# Patient Record
Sex: Female | Born: 1955
Health system: Southern US, Community
[De-identification: ages and names within clinical notes are randomized; demographics above are authoritative.]

## PROBLEM LIST (undated history)

## (undated) DIAGNOSIS — T7840XA Allergy, unspecified, initial encounter: Secondary | ICD-10-CM

## (undated) DIAGNOSIS — Z923 Personal history of irradiation: Secondary | ICD-10-CM

## (undated) DIAGNOSIS — E039 Hypothyroidism, unspecified: Secondary | ICD-10-CM

## (undated) DIAGNOSIS — N390 Urinary tract infection, site not specified: Secondary | ICD-10-CM

## (undated) DIAGNOSIS — F329 Major depressive disorder, single episode, unspecified: Secondary | ICD-10-CM

## (undated) DIAGNOSIS — H269 Unspecified cataract: Secondary | ICD-10-CM

## (undated) DIAGNOSIS — Z955 Presence of coronary angioplasty implant and graft: Secondary | ICD-10-CM

## (undated) DIAGNOSIS — G2581 Restless legs syndrome: Secondary | ICD-10-CM

## (undated) DIAGNOSIS — N189 Chronic kidney disease, unspecified: Secondary | ICD-10-CM

## (undated) DIAGNOSIS — IMO0002 Reserved for concepts with insufficient information to code with codable children: Secondary | ICD-10-CM

## (undated) DIAGNOSIS — J302 Other seasonal allergic rhinitis: Secondary | ICD-10-CM

## (undated) DIAGNOSIS — S060X9A Concussion with loss of consciousness of unspecified duration, initial encounter: Secondary | ICD-10-CM

## (undated) DIAGNOSIS — R3129 Other microscopic hematuria: Secondary | ICD-10-CM

## (undated) DIAGNOSIS — F32A Depression, unspecified: Secondary | ICD-10-CM

## (undated) DIAGNOSIS — K219 Gastro-esophageal reflux disease without esophagitis: Secondary | ICD-10-CM

## (undated) DIAGNOSIS — C50919 Malignant neoplasm of unspecified site of unspecified female breast: Secondary | ICD-10-CM

## (undated) DIAGNOSIS — F419 Anxiety disorder, unspecified: Secondary | ICD-10-CM

## (undated) HISTORY — DX: Hypothyroidism, unspecified: E03.9

## (undated) HISTORY — PX: APPENDECTOMY: SHX54

## (undated) HISTORY — PX: SHOULDER SURGERY: SHX246

## (undated) HISTORY — DX: Depression, unspecified: F32.A

## (undated) HISTORY — PX: BREAST LUMPECTOMY: SHX2

## (undated) HISTORY — DX: Unspecified cataract: H26.9

## (undated) HISTORY — PX: CHOLECYSTECTOMY OPEN: SUR202

## (undated) HISTORY — DX: Restless legs syndrome: G25.81

## (undated) HISTORY — DX: Other microscopic hematuria: R31.29

## (undated) HISTORY — DX: Urinary tract infection, site not specified: N39.0

## (undated) HISTORY — PX: ABDOMINAL HYSTERECTOMY: SHX81

## (undated) HISTORY — PX: EYE SURGERY: SHX253

## (undated) HISTORY — DX: Other seasonal allergic rhinitis: J30.2

## (undated) HISTORY — DX: Major depressive disorder, single episode, unspecified: F32.9

## (undated) HISTORY — DX: Gastro-esophageal reflux disease without esophagitis: K21.9

## (undated) HISTORY — PX: CYSTOSCOPY: SUR368

## (undated) HISTORY — PX: SPINE SURGERY: SHX786

## (undated) HISTORY — DX: Malignant neoplasm of unspecified site of unspecified female breast: C50.919

## (undated) HISTORY — DX: Concussion with loss of consciousness of unspecified duration, initial encounter: S06.0X9A

## (undated) HISTORY — DX: Allergy, unspecified, initial encounter: T78.40XA

## (undated) HISTORY — DX: Chronic kidney disease, unspecified: N18.9

---

## 1995-01-28 HISTORY — PX: OTHER SURGICAL HISTORY: SHX169

## 1998-04-08 ENCOUNTER — Emergency Department (HOSPITAL_COMMUNITY): Admission: EM | Admit: 1998-04-08 | Discharge: 1998-04-08 | Payer: Self-pay | Admitting: Emergency Medicine

## 1998-07-23 ENCOUNTER — Encounter: Payer: Self-pay | Admitting: Family Medicine

## 1998-07-23 ENCOUNTER — Ambulatory Visit (HOSPITAL_COMMUNITY): Admission: RE | Admit: 1998-07-23 | Discharge: 1998-07-23 | Payer: Self-pay | Admitting: Family Medicine

## 1998-11-14 ENCOUNTER — Encounter: Payer: Self-pay | Admitting: Emergency Medicine

## 1998-11-14 ENCOUNTER — Emergency Department (HOSPITAL_COMMUNITY): Admission: EM | Admit: 1998-11-14 | Discharge: 1998-11-14 | Payer: Self-pay | Admitting: Emergency Medicine

## 1999-09-12 ENCOUNTER — Ambulatory Visit (HOSPITAL_COMMUNITY): Admission: RE | Admit: 1999-09-12 | Discharge: 1999-09-12 | Payer: Self-pay | Admitting: Family Medicine

## 1999-09-12 ENCOUNTER — Encounter: Payer: Self-pay | Admitting: Family Medicine

## 2000-12-21 ENCOUNTER — Other Ambulatory Visit: Admission: RE | Admit: 2000-12-21 | Discharge: 2000-12-21 | Payer: Self-pay | Admitting: *Deleted

## 2001-12-08 ENCOUNTER — Ambulatory Visit: Admission: RE | Admit: 2001-12-08 | Discharge: 2001-12-08 | Payer: Self-pay | Admitting: Family Medicine

## 2001-12-08 ENCOUNTER — Encounter: Payer: Self-pay | Admitting: Cardiology

## 2002-05-02 ENCOUNTER — Other Ambulatory Visit: Admission: RE | Admit: 2002-05-02 | Discharge: 2002-05-02 | Payer: Self-pay | Admitting: *Deleted

## 2002-09-28 ENCOUNTER — Encounter: Admission: RE | Admit: 2002-09-28 | Discharge: 2002-09-28 | Payer: Self-pay | Admitting: Family Medicine

## 2002-09-28 ENCOUNTER — Encounter: Payer: Self-pay | Admitting: Family Medicine

## 2003-05-23 ENCOUNTER — Other Ambulatory Visit: Admission: RE | Admit: 2003-05-23 | Discharge: 2003-05-23 | Payer: Self-pay | Admitting: *Deleted

## 2003-07-13 ENCOUNTER — Emergency Department (HOSPITAL_COMMUNITY): Admission: EM | Admit: 2003-07-13 | Discharge: 2003-07-13 | Payer: Self-pay | Admitting: Emergency Medicine

## 2003-10-31 ENCOUNTER — Ambulatory Visit (HOSPITAL_COMMUNITY): Admission: RE | Admit: 2003-10-31 | Discharge: 2003-10-31 | Payer: Self-pay | Admitting: Family Medicine

## 2004-05-08 ENCOUNTER — Ambulatory Visit: Payer: Self-pay | Admitting: Nurse Practitioner

## 2004-05-24 ENCOUNTER — Ambulatory Visit: Payer: Self-pay | Admitting: Family Medicine

## 2004-07-01 ENCOUNTER — Ambulatory Visit: Payer: Self-pay | Admitting: Family Medicine

## 2004-07-08 ENCOUNTER — Ambulatory Visit (HOSPITAL_COMMUNITY): Admission: RE | Admit: 2004-07-08 | Discharge: 2004-07-08 | Payer: Self-pay | Admitting: Family Medicine

## 2004-11-05 ENCOUNTER — Ambulatory Visit (HOSPITAL_COMMUNITY): Admission: RE | Admit: 2004-11-05 | Discharge: 2004-11-05 | Payer: Self-pay | Admitting: Family Medicine

## 2005-01-27 HISTORY — PX: CARDIAC CATHETERIZATION: SHX172

## 2005-04-01 ENCOUNTER — Ambulatory Visit: Payer: Self-pay | Admitting: Family Medicine

## 2005-04-16 ENCOUNTER — Other Ambulatory Visit: Admission: RE | Admit: 2005-04-16 | Discharge: 2005-04-16 | Payer: Self-pay | Admitting: *Deleted

## 2005-05-16 ENCOUNTER — Ambulatory Visit: Payer: Self-pay | Admitting: Internal Medicine

## 2005-05-16 ENCOUNTER — Inpatient Hospital Stay (HOSPITAL_COMMUNITY): Admission: EM | Admit: 2005-05-16 | Discharge: 2005-05-17 | Payer: Self-pay | Admitting: Internal Medicine

## 2005-05-16 ENCOUNTER — Ambulatory Visit: Payer: Self-pay | Admitting: Cardiology

## 2005-05-22 ENCOUNTER — Ambulatory Visit: Payer: Self-pay | Admitting: Internal Medicine

## 2005-06-02 ENCOUNTER — Ambulatory Visit: Payer: Self-pay | Admitting: Internal Medicine

## 2005-06-02 ENCOUNTER — Ambulatory Visit: Payer: Self-pay | Admitting: Cardiology

## 2005-06-05 ENCOUNTER — Ambulatory Visit: Payer: Self-pay

## 2005-06-16 ENCOUNTER — Ambulatory Visit: Payer: Self-pay | Admitting: Cardiology

## 2005-07-01 ENCOUNTER — Ambulatory Visit: Payer: Self-pay | Admitting: Cardiology

## 2005-08-21 ENCOUNTER — Encounter (HOSPITAL_COMMUNITY): Admission: RE | Admit: 2005-08-21 | Discharge: 2005-11-19 | Payer: Self-pay | Admitting: Cardiology

## 2005-09-11 ENCOUNTER — Ambulatory Visit: Payer: Self-pay | Admitting: Cardiology

## 2005-09-16 ENCOUNTER — Ambulatory Visit: Payer: Self-pay | Admitting: Cardiology

## 2005-11-18 ENCOUNTER — Ambulatory Visit (HOSPITAL_COMMUNITY): Admission: RE | Admit: 2005-11-18 | Discharge: 2005-11-18 | Payer: Self-pay | Admitting: Family Medicine

## 2005-11-20 ENCOUNTER — Encounter (HOSPITAL_COMMUNITY): Admission: RE | Admit: 2005-11-20 | Discharge: 2005-11-21 | Payer: Self-pay | Admitting: Cardiology

## 2006-09-07 ENCOUNTER — Ambulatory Visit: Payer: Self-pay | Admitting: Cardiology

## 2006-09-11 ENCOUNTER — Encounter: Payer: Self-pay | Admitting: Internal Medicine

## 2006-09-11 ENCOUNTER — Ambulatory Visit: Payer: Self-pay

## 2006-09-11 LAB — CONVERTED CEMR LAB
ALT: 16 units/L (ref 0–35)
AST: 19 units/L (ref 0–37)
HDL: 38.3 mg/dL — ABNORMAL LOW (ref >39.0)
LDL Cholesterol: 72 mg/dL (ref 0–99)
Total Protein: 7.2 g/dL (ref 6.0–8.3)
VLDL: 22 mg/dL (ref 0–40)

## 2006-11-20 ENCOUNTER — Ambulatory Visit (HOSPITAL_COMMUNITY): Admission: RE | Admit: 2006-11-20 | Discharge: 2006-11-20 | Payer: Self-pay | Admitting: Family Medicine

## 2007-08-05 ENCOUNTER — Ambulatory Visit: Payer: Self-pay | Admitting: Family Medicine

## 2007-08-05 DIAGNOSIS — M5137 Other intervertebral disc degeneration, lumbosacral region: Secondary | ICD-10-CM

## 2007-08-05 DIAGNOSIS — E669 Obesity, unspecified: Secondary | ICD-10-CM | POA: Insufficient documentation

## 2007-08-05 DIAGNOSIS — E785 Hyperlipidemia, unspecified: Secondary | ICD-10-CM | POA: Insufficient documentation

## 2007-08-05 DIAGNOSIS — K219 Gastro-esophageal reflux disease without esophagitis: Secondary | ICD-10-CM

## 2007-08-05 DIAGNOSIS — F329 Major depressive disorder, single episode, unspecified: Secondary | ICD-10-CM

## 2007-08-05 DIAGNOSIS — G43719 Chronic migraine without aura, intractable, without status migrainosus: Secondary | ICD-10-CM

## 2007-08-05 DIAGNOSIS — I2510 Atherosclerotic heart disease of native coronary artery without angina pectoris: Secondary | ICD-10-CM

## 2007-08-05 DIAGNOSIS — R609 Edema, unspecified: Secondary | ICD-10-CM

## 2007-08-06 ENCOUNTER — Encounter: Payer: Self-pay | Admitting: Family Medicine

## 2007-08-06 ENCOUNTER — Telehealth: Payer: Self-pay | Admitting: Family Medicine

## 2007-08-10 LAB — CONVERTED CEMR LAB
ALT: 25 units/L (ref 0–35)
Albumin: 4 g/dL (ref 3.5–5.2)
Alkaline Phosphatase: 57 units/L (ref 39–117)
Basophils Absolute: 0 10*3/uL (ref 0.0–0.1)
Basophils Relative: 0.3 % (ref 0.0–1.0)
CO2: 24 meq/L (ref 19–32)
Chloride: 110 meq/L (ref 96–112)
Creatinine, Ser: 1.4 mg/dL — ABNORMAL HIGH (ref 0.4–1.2)
Eosinophils Absolute: 0.1 10*3/uL (ref 0.0–0.7)
Eosinophils Relative: 2.4 % (ref 0.0–5.0)
HDL: 37.8 mg/dL — ABNORMAL LOW (ref >39.0)
Hemoglobin: 12.8 g/dL (ref 12.0–15.0)
MCHC: 34.8 g/dL (ref 30.0–36.0)
MCV: 87.8 fL (ref 78.0–100.0)
Monocytes Absolute: 0.5 10*3/uL (ref 0.1–1.0)
Monocytes Relative: 9 % (ref 3.0–12.0)
Neutro Abs: 3.6 10*3/uL (ref 1.4–7.7)
Platelets: 183 10*3/uL (ref 150–400)
Potassium: 3.6 meq/L (ref 3.5–5.1)
RBC: 4.2 M/uL (ref 3.87–5.11)
Sodium: 144 meq/L (ref 135–145)
T4, Total: 7.3 ug/dL (ref 5.0–12.5)
Total Bilirubin: 1.3 mg/dL — ABNORMAL HIGH (ref 0.3–1.2)

## 2007-08-31 ENCOUNTER — Ambulatory Visit: Payer: Self-pay | Admitting: Cardiology

## 2007-09-09 ENCOUNTER — Ambulatory Visit: Payer: Self-pay | Admitting: Family Medicine

## 2007-09-09 DIAGNOSIS — R7989 Other specified abnormal findings of blood chemistry: Secondary | ICD-10-CM | POA: Insufficient documentation

## 2007-09-10 LAB — CONVERTED CEMR LAB
Albumin: 3.9 g/dL (ref 3.5–5.2)
BUN: 17 mg/dL (ref 6–23)
CO2: 27 meq/L (ref 19–32)
Calcium: 9.7 mg/dL (ref 8.4–10.5)
Chloride: 109 meq/L (ref 96–112)
Creatinine, Ser: 1.3 mg/dL — ABNORMAL HIGH (ref 0.4–1.2)
GFR calc Af Amer: 55 mL/min
GFR calc non Af Amer: 46 mL/min
Glucose, Bld: 148 mg/dL — ABNORMAL HIGH (ref 70–99)
Hgb A1c MFr Bld: 5.6 % (ref 4.6–6.0)
Phosphorus: 2.8 mg/dL (ref 2.3–4.6)
Potassium: 3.7 meq/L (ref 3.5–5.1)
Sodium: 142 meq/L (ref 135–145)

## 2007-09-16 ENCOUNTER — Ambulatory Visit: Payer: Self-pay | Admitting: Family Medicine

## 2007-09-16 DIAGNOSIS — N289 Disorder of kidney and ureter, unspecified: Secondary | ICD-10-CM

## 2007-09-16 DIAGNOSIS — R7309 Other abnormal glucose: Secondary | ICD-10-CM | POA: Insufficient documentation

## 2007-09-16 DIAGNOSIS — Z87448 Personal history of other diseases of urinary system: Secondary | ICD-10-CM

## 2007-09-16 LAB — CONVERTED CEMR LAB
Glucose, Urine, Semiquant: NEGATIVE
Ketones, urine, test strip: NEGATIVE
Specific Gravity, Urine: 1.015

## 2007-09-17 ENCOUNTER — Encounter: Payer: Self-pay | Admitting: Family Medicine

## 2007-09-22 LAB — CONVERTED CEMR LAB
Microalb Creat Ratio: 1.8 mg/g (ref 0.0–30.0)
Microalb, Ur: 0.2 mg/dL (ref 0.0–1.9)

## 2007-09-24 ENCOUNTER — Encounter: Payer: Self-pay | Admitting: Family Medicine

## 2007-10-08 ENCOUNTER — Encounter: Payer: Self-pay | Admitting: Family Medicine

## 2007-10-11 ENCOUNTER — Encounter (HOSPITAL_COMMUNITY): Admission: RE | Admit: 2007-10-11 | Discharge: 2007-10-11 | Payer: Self-pay | Admitting: Endocrinology

## 2007-11-05 ENCOUNTER — Ambulatory Visit: Payer: Self-pay | Admitting: Family Medicine

## 2007-11-05 DIAGNOSIS — N390 Urinary tract infection, site not specified: Secondary | ICD-10-CM

## 2007-11-06 ENCOUNTER — Encounter: Payer: Self-pay | Admitting: Family Medicine

## 2007-11-08 LAB — CONVERTED CEMR LAB
Albumin: 4.5 g/dL (ref 3.5–5.2)
CO2: 24 meq/L (ref 19–32)
Calcium: 9.9 mg/dL (ref 8.4–10.5)
Glucose, Bld: 105 mg/dL — ABNORMAL HIGH (ref 70–99)
Phosphorus: 4.1 mg/dL (ref 2.3–4.6)

## 2007-11-15 ENCOUNTER — Encounter (HOSPITAL_COMMUNITY): Admission: RE | Admit: 2007-11-15 | Discharge: 2008-01-25 | Payer: Self-pay | Admitting: Endocrinology

## 2007-11-18 ENCOUNTER — Ambulatory Visit: Payer: Self-pay | Admitting: Family Medicine

## 2007-11-22 ENCOUNTER — Ambulatory Visit (HOSPITAL_COMMUNITY): Admission: RE | Admit: 2007-11-22 | Discharge: 2007-11-22 | Payer: Self-pay | Admitting: Family Medicine

## 2007-11-22 LAB — CONVERTED CEMR LAB
Albumin: 3.9 g/dL (ref 3.5–5.2)
BUN: 21 mg/dL (ref 6–23)
GFR calc Af Amer: 60 mL/min
Sodium: 141 meq/L (ref 135–145)

## 2007-12-06 ENCOUNTER — Encounter: Payer: Self-pay | Admitting: Family Medicine

## 2007-12-08 ENCOUNTER — Ambulatory Visit (HOSPITAL_COMMUNITY): Admission: RE | Admit: 2007-12-08 | Discharge: 2007-12-08 | Payer: Self-pay | Admitting: Endocrinology

## 2008-01-03 ENCOUNTER — Ambulatory Visit: Payer: Self-pay | Admitting: Family Medicine

## 2008-01-06 LAB — CONVERTED CEMR LAB
Albumin: 3.7 g/dL (ref 3.5–5.2)
BUN: 25 mg/dL — ABNORMAL HIGH (ref 6–23)
Calcium: 9.9 mg/dL (ref 8.4–10.5)
Chloride: 106 meq/L (ref 96–112)
Creatinine, Ser: 1.3 mg/dL — ABNORMAL HIGH (ref 0.4–1.2)
GFR calc non Af Amer: 46 mL/min
Glucose, Bld: 120 mg/dL — ABNORMAL HIGH (ref 70–99)

## 2008-02-10 ENCOUNTER — Encounter: Payer: Self-pay | Admitting: Family Medicine

## 2008-03-08 ENCOUNTER — Ambulatory Visit: Payer: Self-pay | Admitting: Family Medicine

## 2008-03-09 LAB — CONVERTED CEMR LAB
BUN: 22 mg/dL (ref 6–23)
CO2: 29 meq/L (ref 19–32)
Chloride: 111 meq/L (ref 96–112)
GFR calc Af Amer: 47 mL/min
GFR calc non Af Amer: 39 mL/min
Glucose, Bld: 105 mg/dL — ABNORMAL HIGH (ref 70–99)
Phosphorus: 3.8 mg/dL (ref 2.3–4.6)
Potassium: 4.2 meq/L (ref 3.5–5.1)

## 2008-03-20 ENCOUNTER — Encounter: Payer: Self-pay | Admitting: Family Medicine

## 2008-03-21 ENCOUNTER — Encounter: Payer: Self-pay | Admitting: Family Medicine

## 2008-04-04 ENCOUNTER — Encounter: Payer: Self-pay | Admitting: Family Medicine

## 2008-04-05 ENCOUNTER — Telehealth (INDEPENDENT_AMBULATORY_CARE_PROVIDER_SITE_OTHER): Payer: Self-pay | Admitting: *Deleted

## 2008-04-07 ENCOUNTER — Ambulatory Visit: Payer: Self-pay | Admitting: Family Medicine

## 2008-04-07 DIAGNOSIS — E559 Vitamin D deficiency, unspecified: Secondary | ICD-10-CM

## 2008-04-07 LAB — CONVERTED CEMR LAB
Casts: 0 /lpf
Urobilinogen, UA: 0.2
Yeast, UA: 0
pH: 5

## 2008-04-08 ENCOUNTER — Encounter: Payer: Self-pay | Admitting: Family Medicine

## 2008-04-11 LAB — CONVERTED CEMR LAB
AST: 15 units/L (ref 0–37)
Albumin: 4.2 g/dL (ref 3.5–5.2)
CO2: 23 meq/L (ref 19–32)
Chloride: 108 meq/L (ref 96–112)
Potassium: 3.8 meq/L (ref 3.5–5.3)

## 2008-04-20 ENCOUNTER — Encounter: Payer: Self-pay | Admitting: Family Medicine

## 2008-04-27 ENCOUNTER — Encounter: Payer: Self-pay | Admitting: Family Medicine

## 2008-04-27 ENCOUNTER — Ambulatory Visit: Payer: Self-pay | Admitting: Internal Medicine

## 2008-06-07 ENCOUNTER — Ambulatory Visit: Payer: Self-pay | Admitting: Family Medicine

## 2008-06-07 DIAGNOSIS — R400 Somnolence: Secondary | ICD-10-CM | POA: Insufficient documentation

## 2008-06-12 ENCOUNTER — Encounter: Payer: Self-pay | Admitting: Family Medicine

## 2008-06-12 LAB — CONVERTED CEMR LAB
Albumin: 4.2 g/dL (ref 3.5–5.2)
BUN: 24 mg/dL — ABNORMAL HIGH (ref 6–23)
CO2: 27 meq/L (ref 19–32)
Calcium: 10 mg/dL (ref 8.4–10.5)
Chloride: 107 meq/L (ref 96–112)
Creatinine, Ser: 1.3 mg/dL — ABNORMAL HIGH (ref 0.4–1.2)
Glucose, Bld: 96 mg/dL (ref 70–99)
Hgb A1c MFr Bld: 5.8 % (ref 4.6–6.5)
Phosphorus: 3.6 mg/dL (ref 2.3–4.6)
Potassium: 3.8 meq/L (ref 3.5–5.1)
Sodium: 140 meq/L (ref 135–145)

## 2008-06-21 ENCOUNTER — Ambulatory Visit: Payer: Self-pay | Admitting: Pulmonary Disease

## 2008-06-21 DIAGNOSIS — G4710 Hypersomnia, unspecified: Secondary | ICD-10-CM

## 2008-07-14 ENCOUNTER — Encounter (INDEPENDENT_AMBULATORY_CARE_PROVIDER_SITE_OTHER): Payer: Self-pay | Admitting: *Deleted

## 2008-07-16 ENCOUNTER — Encounter: Payer: Self-pay | Admitting: Pulmonary Disease

## 2008-07-16 ENCOUNTER — Ambulatory Visit (HOSPITAL_BASED_OUTPATIENT_CLINIC_OR_DEPARTMENT_OTHER): Admission: RE | Admit: 2008-07-16 | Discharge: 2008-07-16 | Payer: Self-pay | Admitting: Pulmonary Disease

## 2008-07-24 ENCOUNTER — Encounter: Payer: Self-pay | Admitting: Family Medicine

## 2008-07-26 ENCOUNTER — Ambulatory Visit: Payer: Self-pay | Admitting: Pulmonary Disease

## 2008-07-28 ENCOUNTER — Telehealth (INDEPENDENT_AMBULATORY_CARE_PROVIDER_SITE_OTHER): Payer: Self-pay | Admitting: *Deleted

## 2008-08-01 ENCOUNTER — Ambulatory Visit: Payer: Self-pay | Admitting: Internal Medicine

## 2008-08-14 ENCOUNTER — Ambulatory Visit: Payer: Self-pay | Admitting: Family Medicine

## 2008-08-16 ENCOUNTER — Ambulatory Visit: Payer: Self-pay | Admitting: Pulmonary Disease

## 2008-08-16 DIAGNOSIS — G2589 Other specified extrapyramidal and movement disorders: Secondary | ICD-10-CM

## 2008-08-16 LAB — CONVERTED CEMR LAB
CO2: 29 meq/L (ref 19–32)
Calcium: 9.6 mg/dL (ref 8.4–10.5)
Chloride: 104 meq/L (ref 96–112)
Glucose, Bld: 99 mg/dL (ref 70–99)
Phosphorus: 3.4 mg/dL (ref 2.3–4.6)
Sodium: 139 meq/L (ref 135–145)

## 2008-08-29 ENCOUNTER — Telehealth: Payer: Self-pay | Admitting: Internal Medicine

## 2008-08-29 ENCOUNTER — Ambulatory Visit: Payer: Self-pay | Admitting: Internal Medicine

## 2008-08-30 ENCOUNTER — Encounter: Payer: Self-pay | Admitting: Internal Medicine

## 2008-09-08 ENCOUNTER — Ambulatory Visit: Payer: Self-pay | Admitting: Family Medicine

## 2008-09-08 ENCOUNTER — Encounter: Admission: RE | Admit: 2008-09-08 | Discharge: 2008-09-08 | Payer: Self-pay | Admitting: Family Medicine

## 2008-09-08 LAB — CONVERTED CEMR LAB
Blood in Urine, dipstick: NEGATIVE
Epithelial cells, urine: 1 /lpf
RBC / HPF: 0
Specific Gravity, Urine: 1.01
Urobilinogen, UA: 0.2
WBC Urine, dipstick: NEGATIVE
WBC, UA: 0 cells/hpf
Yeast, UA: 0
pH: 6

## 2008-09-11 LAB — CONVERTED CEMR LAB
Albumin: 4.3 g/dL (ref 3.5–5.2)
CO2: 21 meq/L (ref 19–32)
Calcium: 9.9 mg/dL (ref 8.4–10.5)
Chloride: 110 meq/L (ref 96–112)
Glucose, Bld: 97 mg/dL (ref 70–99)
Potassium: 4.1 meq/L (ref 3.5–5.3)
Sodium: 142 meq/L (ref 135–145)

## 2008-09-20 ENCOUNTER — Ambulatory Visit: Payer: Self-pay | Admitting: Cardiology

## 2008-09-22 ENCOUNTER — Ambulatory Visit: Payer: Self-pay | Admitting: Cardiology

## 2008-09-28 ENCOUNTER — Encounter: Payer: Self-pay | Admitting: Family Medicine

## 2008-09-28 LAB — CONVERTED CEMR LAB
ALT: 30 units/L (ref 0–35)
AST: 27 units/L (ref 0–37)
Cholesterol: 119 mg/dL (ref 0–200)
LDL Cholesterol: 57 mg/dL (ref 0–99)
Total Bilirubin: 1.3 mg/dL — ABNORMAL HIGH (ref 0.3–1.2)
Total CHOL/HDL Ratio: 3
Total Protein: 7.5 g/dL (ref 6.0–8.3)
Triglycerides: 124 mg/dL (ref 0.0–149.0)

## 2008-10-03 ENCOUNTER — Encounter: Payer: Self-pay | Admitting: Internal Medicine

## 2008-10-03 ENCOUNTER — Ambulatory Visit: Payer: Self-pay | Admitting: Internal Medicine

## 2008-10-08 ENCOUNTER — Encounter: Payer: Self-pay | Admitting: Internal Medicine

## 2008-11-02 ENCOUNTER — Telehealth: Payer: Self-pay | Admitting: Cardiology

## 2008-12-08 ENCOUNTER — Telehealth: Payer: Self-pay | Admitting: Family Medicine

## 2008-12-11 ENCOUNTER — Ambulatory Visit: Payer: Self-pay | Admitting: Family Medicine

## 2008-12-12 LAB — CONVERTED CEMR LAB
BUN: 19 mg/dL (ref 6–23)
Creatinine, Ser: 1.3 mg/dL — ABNORMAL HIGH (ref 0.4–1.2)
Glucose, Bld: 113 mg/dL — ABNORMAL HIGH (ref 70–99)
Hgb A1c MFr Bld: 5.5 % (ref 4.6–6.5)

## 2009-02-26 ENCOUNTER — Encounter: Payer: Self-pay | Admitting: Family Medicine

## 2009-03-08 ENCOUNTER — Telehealth: Payer: Self-pay | Admitting: Family Medicine

## 2009-03-12 ENCOUNTER — Ambulatory Visit: Payer: Self-pay | Admitting: Family Medicine

## 2009-03-13 ENCOUNTER — Telehealth: Payer: Self-pay | Admitting: Family Medicine

## 2009-03-14 ENCOUNTER — Telehealth: Payer: Self-pay | Admitting: Family Medicine

## 2009-03-15 LAB — CONVERTED CEMR LAB
CO2: 27 meq/L (ref 19–32)
Calcium: 9.8 mg/dL (ref 8.4–10.5)
Creatinine, Ser: 1.4 mg/dL — ABNORMAL HIGH (ref 0.4–1.2)
Glucose, Bld: 121 mg/dL — ABNORMAL HIGH (ref 70–99)
Potassium: 4 meq/L (ref 3.5–5.1)
Sodium: 142 meq/L (ref 135–145)

## 2009-03-26 ENCOUNTER — Ambulatory Visit: Payer: Self-pay | Admitting: Family Medicine

## 2009-03-27 ENCOUNTER — Telehealth: Payer: Self-pay | Admitting: Family Medicine

## 2009-03-27 ENCOUNTER — Encounter: Payer: Self-pay | Admitting: Family Medicine

## 2009-04-11 ENCOUNTER — Encounter: Payer: Self-pay | Admitting: Family Medicine

## 2009-06-26 ENCOUNTER — Ambulatory Visit: Payer: Self-pay | Admitting: Family Medicine

## 2009-06-27 LAB — CONVERTED CEMR LAB: Hgb A1c MFr Bld: 5.8 % (ref 4.6–6.5)

## 2009-06-29 ENCOUNTER — Ambulatory Visit: Payer: Self-pay | Admitting: Family Medicine

## 2009-09-04 ENCOUNTER — Telehealth: Payer: Self-pay | Admitting: Family Medicine

## 2009-09-14 ENCOUNTER — Encounter: Payer: Self-pay | Admitting: Family Medicine

## 2009-09-25 ENCOUNTER — Ambulatory Visit: Payer: Self-pay | Admitting: Family Medicine

## 2009-09-25 LAB — CONVERTED CEMR LAB
AST: 27 units/L (ref 0–37)
Albumin: 4.1 g/dL (ref 3.5–5.2)
BUN: 16 mg/dL (ref 6–23)
CO2: 26 meq/L (ref 19–32)
Calcium: 10 mg/dL (ref 8.4–10.5)
Creatinine, Ser: 1.2 mg/dL (ref 0.4–1.2)
GFR calc non Af Amer: 48.15 mL/min (ref >60)
Glucose, Bld: 117 mg/dL — ABNORMAL HIGH (ref 70–99)
Sodium: 142 meq/L (ref 135–145)

## 2009-10-02 ENCOUNTER — Ambulatory Visit: Payer: Self-pay | Admitting: Family Medicine

## 2010-02-13 ENCOUNTER — Telehealth: Payer: Self-pay | Admitting: Family Medicine

## 2010-02-26 NOTE — Progress Notes (Signed)
Summary: Prior Authorization for Pantoprazole Sodium  Phone Note From Pharmacy   [facility]  [address] [phone]* Call For: Dr. Milinda Antis  Summary of Call: Received fax from pharmacy stating that PA is needed for Pantoprazole Sodium 40mg .  Called [employer] at [phone] and requested forms, will be sent within the hour.  Case# [id number].  Linde Gillis CMA Duncan Dull)  March 27, 2009 8:24 AM   Received PA form, in your IN box. Initial call taken by: Linde Gillis CMA Duncan Dull),  March 27, 2009 10:21 AM  Follow-up for Phone Call        form done and in nurse in box  Follow-up by: Judith Part MD,  March 27, 2009 11:19 AM  Additional Follow-up for Phone Call Additional follow up Details #1::        completed form faxed to [phone] as instructed. The original form given to [facility] needed later.Lewanda Rife LPN  March 28, 8655 3:44 PM      Appended Document: Prior Authorization for Pantoprazole Sodium Received PA Approval for Pantoprazole Sodium.  Approved from 03/06/2009 through 03/27/2010.  Patient and pharmacy notified.

## 2010-02-26 NOTE — Consult Note (Signed)
Summary: Dr.Martin Webb,[facility],Note  Dr.Martin Webb,[facility],Note   Imported By: Beau Fanny 04/16/2009 16:43:30  _____________________________________________________________________  External Attachment:    Type:   Image     Comment:   External Document

## 2010-02-26 NOTE — Assessment & Plan Note (Signed)
Summary: 6 mo f/u/tsc   Vital Signs:  Patient profile:   55 year old female Height:      67 inches Weight:      219.25 pounds BMI:     34.46 Temp:     97.6 degrees F oral Pulse rate:   60 / minute Pulse rhythm:   regular BP sitting:   90 / 60  (left arm) Cuff size:   large  Vitals Entered By: Lewanda Rife LPN (March 12, 2009 2:19 PM)  History of Present Illness: here to f/u for multiple chronic medical problems   is always feeling bad - wishes she could feel decent once in a while  constantly feels bad  persistant sore throat -- for a while like swallowing glass  Dr Talmage Nap does not think this is thyroid related  going on for months - this is really uncomfortably and spasming  feels icy cold at times  hoarseness constantly with low voice that goes on days at a time  also a lot of burning and burning  Dr [place] told her to stop her nexium -- due to plavix   (Dr Sharyn Lull had her on PPI)  zantac is not helpful at all    renal insuff was imp in nov with Cr 1.3 down from over 1.5 - on lasix instead of hctz  sugar control has been good with AIC 5.5   lipids great in summer with LDL 57- well within goal  thyroid status-- blood work with Dr Talmage Nap has been fine  does not think her thyroid is making her feel bad  is still incredibly cold all the time   edema  still sees Dr Farrel Demark-- really feels like she is going crazy every day with all of her physical symptoms   sleep disorder-- never figured it out - disc resless leg  is chronically tired  sleep doctor could not figure it out   has very sharp pains in arms and legs and thighs - not in the joints  when she walks - it aches severe aching  is not her cholesterol medicine for 3 weeks and no change at all  Dr Anne Hahn (takes care of her migraines) -- put her on gabapentin for poss restless leg syndrome  he also increased her topamax -- she wonders about the side effects   Allergies (verified): No Known Drug Allergies  Past  History:  Past Medical History: Last updated: 09/20/2008 Depression Eating disorder-- anorexia (better now) Headaches /migraine  GERD Allergies  arrythmia  CAD 2007 with stent (95% circumflex disease   treated with a Taxus stent, LAD with mild irregularities, right coronary   artery 30% stenosis, EF normal) Frequent utis-- on prophylaxis Edema (no HTN) Hyperlipidemia Vitamin D deficiency Microscopic hematuria with urol w/u  Restless leg disorder Urol-- Dr Ronal Fear Urol--Dr Wynelle Link Cardiol-- Dr Antoine Poche Neurol-- Dr Anne Hahn  Allergies-- Dr Sharyn Lull Gyn past -- Dr Randell Patient  Counselor-- Dr Farrel Demark Endo -- Talmage Nap  Past Surgical History: Last updated: 09/20/2008 Cholecystectomy Appendectomy  Hysterectomy- partial- then oophrectmy (ovarian cysts )  Lparoscopies for ovarian cysts Herniated disc low back 97-rapaired cystoscopy neg (Dr Ronal Fear) 9/09 CT abd/pelvis  Hyperthyroid - tx with rad iodine Hypothyroid after above tx Endo--Balan  Family History: Last updated: 09/20/2008 mother- CAD (cabg), DM, alzheimers in nsg home, thyroid problem, HTN father - does not know much about  2 brothers in good health  GF died of CAD , pancreatic ca uncles and aunts HTN GM with mental illness aunts with  mental illness  uncle with throat cancer  uncle with brain tumor  several distant family members with melanoma  emphysema: paternal aunt allergies: brothers heart disease: mother, maternal grandfather cancer: maternal grandfather (pancreatic), paternal aunt (ovarian), paternal uncle (bone, brother (melanoma)   Social History: Last updated: 09/20/2008 is a widow- lost husb to colon ca 11 years ago  son 71 years old is currently single and living with son.  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 -only smoked for 1 year as a teenager Alcohol Use - yes-occasional Illicit Drug Use - no Patient does not get regular exercise.   Risk  Factors: Exercise: no (08/29/2008)  Risk Factors: Smoking Status: quit (08/29/2008)  Review of Systems General:  Complains of fatigue and malaise; denies loss of appetite. Eyes:  eyes feel gritty and swollen constantly - on patanol . ENT:  Complains of sore throat; denies earache and nasal congestion. CV:  Denies chest pain or discomfort, lightheadness, and palpitations. Resp:  Denies cough, shortness of breath, and wheezing. GI:  Complains of gas, indigestion, and nausea; denies abdominal pain, bloody stools, change in bowel habits, and vomiting. GU:  Denies dysuria and urinary frequency. MS:  Complains of joint pain, muscle aches, and stiffness; denies cramps and muscle weakness. Derm:  Denies itching, lesion(s), poor wound healing, and rash. Neuro:  Complains of headaches; denies numbness, sensation of room spinning, and tingling. Psych:  Complains of anxiety and depression; denies panic attacks, sense of great danger, and suicidal thoughts/plans. Endo:  Denies excessive thirst and excessive urination. Heme:  Denies abnormal bruising and bleeding.  Physical Exam  General:  overweight but generally well appearing  Head:  normocephalic, atraumatic, and no abnormalities observed.   Eyes:  vision grossly intact, pupils equal, pupils round, pupils reactive to light, and no injection.   Ears:  R ear normal and no external deformities.   Nose:  no nasal discharge.   Mouth:  pharynx pink and moist, no erythema, and no exudates.   Neck:  supple with full rom and no masses or thyromegally, no JVD or carotid bruit  Chest Wall:  No deformities, masses, or tenderness noted. Lungs:  Normal respiratory effort, chest expands symmetrically. Lungs are clear to auscultation, no crackles or wheezes. Heart:  Normal rate and regular rhythm. S1 and S2 normal without gallop, murmur, click, rub or other extra sounds. Abdomen:  mild epigastric tenderness without rebound or gaurding normal bowel sounds,  no distention, no hepatomegaly, and no splenomegaly.   Msk:  No deformity or scoliosis noted of thoracic or lumbar spine.  no CVA tenderness  no acute joint changes  Pulses:  R and L carotid,radial,femoral,dorsalis pedis and posterior tibial pulses are full and equal bilaterally Extremities:  improved edema in ankles- trace Neurologic:  sensation intact to light touch, gait normal, and DTRs symmetrical and normal.   Skin:  Intact without suspicious lesions or rashes Cervical Nodes:  No lymphadenopathy noted Inguinal Nodes:  No significant adenopathy Psych:  generally anxious and very negative  frustrated with her malaise talks freely about stress   Impression & Recommendations:  Problem # 1:  HYPERSOMNIA (ICD-780.54) Assessment Unchanged still unable to find out etiology gabapentin not helping disc poss of depression or topamax side eff  disc further at f/u  ? consider wellbutrin  Problem # 2:  RENAL INSUFFICIENCY (ICD-588.9) Assessment: Improved lab today had imp with switch to lasix  ? if topamax is playing role enc good  water intake Orders: Venipuncture (55732) TLB-Renal Function Panel (80069-RENAL)  Problem # 3:  HYPERGLYCEMIA (ICD-790.29) Assessment: Unchanged  AIC persists below 6 enc to keep watching diet for sugar and work on wt loss  Labs Reviewed: Creat: 1.3 (12/11/2008)     Problem # 4:  COMMON MIGRAINE (ICD-346.10) Assessment: Unchanged pt is imp with topamax- but wonder about side eff of depression/pain/ eye eff/renal she is f/u soon with opthy enc to disc with her neurologist  Her updated medication list for this problem includes:    Propranolol Hcl Cr 120 Mg Cp24 (Propranolol hcl) .Marland Kitchen... Take 1 capsule once daily    [facility] Aspirin 325 Mg Tabs (Aspirin) .Marland Kitchen... Take 1 tablet once daily  Problem # 5:  HYPERLIPIDEMIA (ICD-272.4) Assessment: Unchanged  very good control with statin no imp in muscle painoff of it  Her updated medication list for this  problem includes:    Simvastatin 40 Mg Tabs (Simvastatin) .Marland Kitchen... Take 1 tablet at bedtime  Labs Reviewed: SGOT: 27 (09/22/2008)   SGPT: 30 (09/22/2008)  Prior 10 Yr Risk Heart Disease: N/A (09/20/2008)   HDL:37.70 (09/22/2008), 37.8 (08/05/2007)  LDL:57 (09/22/2008), 91 (08/05/2007)  Chol:119 (09/22/2008), 154 (08/05/2007)  Trig:124.0 (09/22/2008), 126 (08/05/2007)  Problem # 6:  GERD (ICD-530.81) much worse with throat symptoms off ppi will re start protonix -- less likely to interact with plavix  at this point symptoms are severe and zantac does not work f/u 2 wk  The following medications were removed from the medication list:    Nexium 40 Mg Cpdr (Esomeprazole magnesium) .Marland Kitchen... Take 1 tablet by mouth two times a day Her updated medication list for this problem includes:    Protonix 40 Mg Tbec (Pantoprazole sodium) .Marland Kitchen... 1 by mouth once daily  Complete Medication List: 1)  Vitamin D 1000 Unit Tabs (Cholecalciferol) .... Take 1 tablet by mouth two times a day 2)  Astelin 137 Mcg/spray Soln (Azelastine hcl) .... Inhale 1 to 2 sprays in each nostril once to twice daily as needed for runny nose and drainage 3)  Patanol 0.1 % Soln (Olopatadine hcl) .... Place 1 drop in each eye 1 to 2 times daily as needed for itchy eyes 4)  Fluticasone Propionate 50 Mcg/act Susp (Fluticasone propionate) .... Use 2 spray in each nostril once daily as needed for stuffy nose or drainage 5)  Klor-con M20 20 Meq Tbcr (Potassium chloride crys cr) .... Take 2 tablest by mouth very 12 hours 6)  Cephalexin 250 Mg Tabs (Cephalexin) .... Take 1 tablet by mouth once a day 7)  Simvastatin 40 Mg Tabs (Simvastatin) .... Take 1 tablet at bedtime 8)  Propranolol Hcl Cr 120 Mg Cp24 (Propranolol hcl) .... Take 1 capsule once daily 9)  Plavix 75 Mg Tabs (Clopidogrel bisulfate) .... Take 1 tablet once daily 10)  Topiramate 100 Mg Tabs (Topiramate) .... Take 1 tablet two times a day 11)  [facility] Aspirin 325 Mg Tabs (Aspirin) ....  Take 1 tablet once daily 12)  Omega-3 Fish Oil 1200 Mg Caps (Omega-3 fatty acids) .... Take 1 capsule by mouth two times a day 13)  Multivitamins Caps (Multiple vitamin) .... Take 2 tables by mouth once a day 14)  Calcium 1200 1200-1000 Mg-unit Chew (Calcium carbonate-vit d-min) .... Take 1 chew by mouth two times a day 15)  Ginkgo Biloba Extr (Ginkgo biloba) .... Take 1 tablet by mouth once a day (120 mg) 16)  Co Q-10 200mg  Caps (coenzyme Q10)  .... Take 1 tablet by mouth once a  day 17)  Armour Thyroid 90 Mg Tabs (Thyroid) .... Once dailt 18)  Lasix 20 Mg Tabs (Furosemide) .... Take 1 tablet by mouth once a day 19)  K Dur 10 Meq  .... Take 1 tablet by mouth once a day 20)  Nitroglycerin 0.4 Mg Subl (Nitroglycerin) .... One tablet under tongue every 5 minutes as needed for chest pain---may repeat times three 21)  Topiramate 50 Mg Tabs (Topiramate) .... Take one daily 22)  Gabapentin 300 Mg Caps (Gabapentin) .... Take one in morning and take two at night 23)  Protonix 40 Mg Tbec (Pantoprazole sodium) .Marland Kitchen.. 1 by mouth once daily  Patient Instructions: 1)  start protonix 40 mg daily for stomach and throat symptoms  2)  this may interact with plavix - but may be less apt to interfere than nexium  3)  see you eye doctor  4)  checking kidney function today  5)  whether you want to continue gabapentin is up to you  6)  have a visit to discuss topamax with your neurologist - I am worried about side effects and all the symptoms you are having 7)  follow up with me in 2 weeks  8)  I may consider wellbutrin for depression in the future  Prescriptions: PROTONIX 40 MG TBEC (PANTOPRAZOLE SODIUM) 1 by mouth once daily  #30 x 11   Entered and Authorized by:   Judith Part MD   Signed by:   Judith Part MD on 03/12/2009   Method used:   [facility]        [facility] [phone]* (retail)       [address]       [place], [place]  [id number]       Ph:  [phone] or [phone]       Fax: [phone]   RxID:   [id number]   Current Allergies (reviewed today): No known allergies

## 2010-02-26 NOTE — Medication Information (Signed)
Summary: [employer] Prior Auth approval for Pantoprazole Sodium Tablet Dr from  [employer] Prior Auth approval for Pantoprazole Sodium Tablet Dr from 03/06/09-03/27/10   Imported By: Beau Fanny 03/30/2009 11:42:05  _____________________________________________________________________  External Attachment:    Type:   Image     Comment:   External Document  Appended Document: [employer] Prior Auth approval for Pantoprazole Sodium Tablet Dr from please let pt know protonix was approved and ask her if she needs px  if so can call in 1 mo with 11 ref - thanks

## 2010-02-26 NOTE — Progress Notes (Signed)
Summary: Protonix not covered by insurance  Phone Note Call from Patient Call back at [phone]   Caller: Patient Call For: Judith Part MD Summary of Call: Pt said insurance will not cover Protonix. Pt took Nexium before but her heart dr. stopped Nexium because she takes Plavix. Pt will get pharmacy to send a prior authorization for Protonix. Initial call taken by: Lewanda Rife LPN,  March 14, 2009 3:35 PM  Follow-up for Phone Call        send this back to me when prior auth arrives please Follow-up by: Judith Part MD,  March 14, 2009 3:57 PM  Additional Follow-up for Phone Call Additional follow up Details #1::        [facility] called [facility] [address] and they will print what they have available and send to Dr. Milinda Antis.Lewanda Rife LPN  March 26, 2009 4:07 PM

## 2010-02-26 NOTE — Consult Note (Signed)
Summary: Dr.Martin Webb,[facility],Note  [facility]   Imported By: Beau Fanny 07/04/2009 16:41:05  _____________________________________________________________________  External Attachment:    Type:   Image     Comment:   External Document

## 2010-02-26 NOTE — Progress Notes (Signed)
Summary: Clarification on K+ supplement  Phone Note Refill Request Call back at [phone] Message from:  [facility] [address] on March 08, 2009 4:08 PM  Refills Requested: Medication #1:  K DUR 10 MEQ Take 1 tablet by mouth once a day   Last Refilled: [facility]/08/2009 [facility] pt already filled the klor con and was to pick it up today. She would not fill the and will ask pt to talk with you about K+ supplement on her appt 03/12/09. Is that ok?  Initial call taken by: Lewanda Rife LPN,  March 08, 2009 4:12 PM  Follow-up for Phone Call        by the med list she is supposed to be on the 20 meq  Follow-up by: Judith Part MD,  March 08, 2009 4:23 PM  Additional Follow-up for Phone Call Additional follow up Details #1::        spoke with Arlys John at [facility] [address] and he was notified and he said he would tell pt and tell pt to keep appt on Mon . Lewanda Rife LPN  March 08, 2009 4:27 PM

## 2010-02-26 NOTE — Assessment & Plan Note (Signed)
Summary: 2 WEEK FOLLOW UPR/BH   Vital Signs:  Patient profile:   55 year old female Height:      67 inches Weight:      220.25 pounds BMI:     34.62 Temp:     98.5 degrees F oral Pulse rate:   60 / minute Pulse rhythm:   regular BP sitting:   104 / 64  (left arm) Cuff size:   large  Vitals Entered By: Lewanda Rife LPN (March 26, 2009 2:07 PM)  History of Present Illness: here for f/u of mult med problems  was ref to  renal for renal insuff cr 1.4 had called and returned the call - left a mess to get appt -- pend call back   was on gabapentin for rls--takes it occasionally with pain in general (moreso with rls )    is cutting down the topamax in 50 mg increments  spoke with her neurologist -- wants to keep her on the propranolol  watching closely for headaches   depression  mood is about the same as last time   gerd/ throat symptoms  need a prior auth for protonix  no  chance to start it  still takes a nexium now and again   overall is hanging in there   checks her sugar every so often -- has gone up lately    Allergies (verified): No Known Drug Allergies  Past History:  Past Medical History: Last updated: 09/20/2008 Depression Eating disorder-- anorexia (better now) Headaches /migraine  GERD Allergies  arrythmia  CAD 2007 with stent (95% circumflex disease   treated with a Taxus stent, LAD with mild irregularities, right coronary   artery 30% stenosis, EF normal) Frequent utis-- on prophylaxis Edema (no HTN) Hyperlipidemia Vitamin D deficiency Microscopic hematuria with urol w/u  Restless leg disorder Urol-- Dr Ronal Fear Urol--Dr Wynelle Link Cardiol-- Dr Antoine Poche Neurol-- Dr Anne Hahn  Allergies-- Dr Octavia Bruckner past -- Dr Randell Patient  Counselor-- Dr Farrel Demark Endo -- Talmage Nap  Past Surgical History: Last updated: 09/20/2008 Cholecystectomy Appendectomy  Hysterectomy- partial- then oophrectmy (ovarian cysts )  Lparoscopies for ovarian cysts Herniated disc low  back 97-rapaired cystoscopy neg (Dr Ronal Fear) 9/09 CT abd/pelvis  Hyperthyroid - tx with rad iodine Hypothyroid after above tx Endo--Balan  Family History: Last updated: 09/20/2008 mother- CAD (cabg), DM, alzheimers in nsg home, thyroid problem, HTN father - does not know much about  2 brothers in good health  GF died of CAD , pancreatic ca uncles and aunts HTN GM with mental illness aunts with mental illness  uncle with throat cancer  uncle with brain tumor  several distant family members with melanoma  emphysema: paternal aunt allergies: brothers heart disease: mother, maternal grandfather cancer: maternal grandfather (pancreatic), paternal aunt (ovarian), paternal uncle (bone, brother (melanoma)   Social History: Last updated: 09/20/2008 is a widow- lost husb to colon ca 11 years ago  son 32 years old is currently single and living with son.  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 -only smoked for 1 year as a teenager Alcohol Use - yes-occasional Illicit Drug Use - no Patient does not get regular exercise.   Risk Factors: Exercise: no (08/29/2008)  Risk Factors: Smoking Status: quit (08/29/2008)  Review of Systems General:  Complains of fatigue; denies fever, loss of appetite, and malaise. Eyes:  Denies blurring and eye pain. CV:  Denies chest pain or discomfort and palpitations. Resp:  Denies cough, sputum productive,  and wheezing. GI:  Complains of indigestion; denies abdominal pain, bloody stools, change in bowel habits, nausea, and vomiting. MS:  Complains of joint pain and stiffness; denies cramps and muscle weakness. Derm:  Denies itching, lesion(s), and rash. Neuro:  Complains of headaches; denies numbness and tingling. Psych:  Complains of anxiety. Endo:  Denies cold intolerance, excessive thirst, excessive urination, and heat intolerance. Heme:  Denies abnormal bruising and bleeding.  Physical  Exam  General:  overweight but generally well appearing  Head:  normocephalic, atraumatic, and no abnormalities observed.   Eyes:  vision grossly intact, pupils equal, pupils round, pupils reactive to light, and no injection.   Neck:  supple with full rom and no masses or thyromegally, no JVD or carotid bruit  Lungs:  Normal respiratory effort, chest expands symmetrically. Lungs are clear to auscultation, no crackles or wheezes. Heart:  Normal rate and regular rhythm. S1 and S2 normal without gallop, murmur, click, rub or other extra sounds. Abdomen:  some mild epigastric tenderness without rebound or gaurding  normal bowel sounds, no distention, no masses, no hepatomegaly, and no splenomegaly.   Msk:  No deformity or scoliosis noted of thoracic or lumbar spine.  no acute joint changes  Pulses:  R and L carotid,radial,femoral,dorsalis pedis and posterior tibial pulses are full and equal bilaterally Extremities:  No clubbing, cyanosis, edema, or deformity noted with normal full range of motion of all joints.   Neurologic:  sensation intact to light touch, gait normal, and DTRs symmetrical and normal.   Skin:  Intact without suspicious lesions or rashes Cervical Nodes:  No lymphadenopathy noted Inguinal Nodes:  No significant adenopathy Psych:  normal affect, talkative and pleasant    Impression & Recommendations:  Problem # 1:  HYPERGLYCEMIA (ICD-790.29) Assessment Unchanged will continue to watch diet and work on wt loss per pt except stable plan on AIC in 3 mo and f/u  Problem # 2:  RENAL INSUFFICIENCY (ICD-588.9) Assessment: Deteriorated for f/u with nephrology- to make appt hopefully on the way out  pt is decreasing dose of topamax and doing ok so far   Problem # 3:  GERD (ICD-530.81) Assessment: Unchanged unable to get protonix yet disc pref for this over other PPI due to concominant plavix tx  will call in asap to get prior auth given sample of aciphex if needed f/u 3 mo  or earlier if throat symptoms do not imp  Her updated medication list for this problem includes:    Protonix 40 Mg Tbec (Pantoprazole sodium) .Marland Kitchen... 1 by mouth once daily  Complete Medication List: 1)  Vitamin D 1000 Unit Tabs (Cholecalciferol) .... Take 1 tablet by mouth two times a day 2)  Astelin 137 Mcg/spray Soln (Azelastine hcl) .... Inhale 1 to 2 sprays in each nostril once to twice daily as needed for runny nose and drainage 3)  Patanol 0.1 % Soln (Olopatadine hcl) .... Place 1 drop in each eye 1 to 2 times daily as needed for itchy eyes 4)  Fluticasone Propionate 50 Mcg/act Susp (Fluticasone propionate) .... Use 2 spray in each nostril once daily as needed for stuffy nose or drainage 5)  Klor-con M20 20 Meq Tbcr (Potassium chloride crys cr) .... Take 2 tablest by mouth very 12 hours 6)  Cephalexin 250 Mg Tabs (Cephalexin) .... Take 1 tablet by mouth once a day 7)  Simvastatin 40 Mg Tabs (Simvastatin) .... Take 1 tablet at bedtime 8)  Propranolol Hcl Cr 120 Mg Cp24 (Propranolol hcl) .... Take 1  capsule once daily 9)  Plavix 75 Mg Tabs (Clopidogrel bisulfate) .... Take 1 tablet once daily 10)  Cvs Aspirin 325 Mg Tabs (Aspirin) .... Take 1 tablet once daily 11)  Omega-3 Fish Oil 1200 Mg Caps (Omega-3 fatty acids) .... Take 1 capsule by mouth two times a day 12)  Multivitamins Caps (Multiple vitamin) .... Take 2 tables by mouth once a day 13)  Calcium 1200 1200-1000 Mg-unit Chew (Calcium carbonate-vit d-min) .... Take 1 chew by mouth two times a day 14)  Ginkgo Biloba Extr (Ginkgo biloba) .... Take 1 tablet by mouth once a day (120 mg) 15)  Co Q-10 200mg  Caps (coenzyme Q10)  .... Take 1 tablet by mouth once a day 16)  Armour Thyroid 90 Mg Tabs (Thyroid) .... Once dailt 17)  Lasix 20 Mg Tabs (Furosemide) .... Take 1 tablet by mouth once a day 18)  K Dur 10 Meq  .... Take 1 tablet by mouth once daily 19)  Nitroglycerin 0.4 Mg Subl (Nitroglycerin) .... One tablet under tongue every 5 minutes  as needed for chest pain---may repeat times three 20)  Topiramate 50 Mg Tabs (Topiramate) .... Take one daily 21)  Gabapentin 300 Mg Caps (Gabapentin) .... Take one in morning and take two at night 22)  Protonix 40 Mg Tbec (Pantoprazole sodium) .Marland Kitchen.. 1 by mouth once daily  Patient Instructions: 1)  please call your insurance company to have them send me a prior auth  2)  stop by and see Shirlee Limerick on the way out -- to schedule nephrology appt  3)  try aciphex one pill daily while waiting for prior auth for protonix  4)  schedule labs in 3 months renal and AIC and then follow up with me for hyperglycemia 5)  keep working on healthy diet and exercise  Current Allergies (reviewed today): No known allergies

## 2010-02-26 NOTE — Assessment & Plan Note (Signed)
Summary: F/U AFTER LABS / LFW R/S 07/02/09   Vital Signs:  Patient profile:   55 year old female Height:      67 inches Weight:      220.25 pounds BMI:     34.62 Temp:     98.3 degrees F oral Pulse rate:   60 / minute Pulse rhythm:   regular BP sitting:   98 / 60  (left arm) Cuff size:   large  Vitals Entered By: Lewanda Rife LPN (June 29, 1608 2:11 PM) CC: three month f/u after labs   History of Present Illness: here for f/u of hyperglycemia / edema and renal insuff  is doing some better  feeling better overall - perhaps from warmer weather  gets outdoors a bit more   started exercise program and videos -- Margie Billet  does it every day no matter what -- wt went up and then down -- has recently lost 10 lbs  back still hurts a lot - that is about the same is off topamax  is eating healthier too -- eats healthy all the time  eliminated simple sugar entirely and salt and grease   had visit with renal physician -- told to minimize nsaids and protonix if poss / use diuretics sparingly did [place] of renal system and some labs  everything came out just fine - ? if cr came down a bit   just saw him again this week - thought she was doing just fine  recommended gyn for so many urinary infection   gets quite a few headaches - but tries to minimize nsaids unless she really needs them  AIC is 5.8- up from 5.5 - still under DM range   thinks her keflex gives her continued yeast (trimethoprim did not ) -- this gets better with otc and comes back  eats lot of yogurt to help she really wants to move to different proph med  taken off trimethoprim in past due to inc cr   Allergies (verified): No Known Drug Allergies  Past History:  Past Medical History: Last updated: 09/20/2008 Depression Eating disorder-- anorexia (better now) Headaches /migraine  GERD Allergies  arrythmia  CAD 2007 with stent (95% circumflex disease   treated with a Taxus stent, LAD with mild  irregularities, right coronary   artery 30% stenosis, EF normal) Frequent utis-- on prophylaxis Edema (no HTN) Hyperlipidemia Vitamin D deficiency Microscopic hematuria with urol w/u  Restless leg disorder Urol-- Dr Ronal Fear Urol--Dr Wynelle Link Cardiol-- Dr Antoine Poche Neurol-- Dr Anne Hahn  Allergies-- Dr Octavia Bruckner past -- Dr Randell Patient  Counselor-- Dr Farrel Demark Endo -- Talmage Nap  Past Surgical History: Last updated: 09/20/2008 Cholecystectomy Appendectomy  Hysterectomy- partial- then oophrectmy (ovarian cysts )  Lparoscopies for ovarian cysts Herniated disc low back 97-rapaired cystoscopy neg (Dr Ronal Fear) 9/09 CT abd/pelvis  Hyperthyroid - tx with rad iodine Hypothyroid after above tx Endo--Balan  Family History: Last updated: 09/20/2008 mother- CAD (cabg), DM, alzheimers in nsg home, thyroid problem, HTN father - does not know much about  2 brothers in good health  GF died of CAD , pancreatic ca uncles and aunts HTN GM with mental illness aunts with mental illness  uncle with throat cancer  uncle with brain tumor  several distant family members with melanoma  emphysema: paternal aunt allergies: brothers heart disease: mother, maternal grandfather cancer: maternal grandfather (pancreatic), paternal aunt (ovarian), paternal uncle (bone, brother (melanoma)   Social History: Last updated: 09/20/2008 is a widow- lost husb to  colon ca 11 years ago  son 27 years old is currently single and living with son.  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 -only smoked for 1 year as a teenager Alcohol Use - yes-occasional Illicit Drug Use - no Patient does not get regular exercise.   Risk Factors: Exercise: no (08/29/2008)  Risk Factors: Smoking Status: quit (08/29/2008)  Review of Systems General:  Complains of fatigue; denies fever, loss of appetite, and malaise. Eyes:  Denies blurring and eye irritation. CV:  Complains of swelling of feet;  denies chest pain or discomfort, palpitations, and shortness of breath with exertion. Resp:  Denies cough, shortness of breath, and wheezing. GI:  Denies abdominal pain, change in bowel habits, and nausea. GU:  Denies dysuria, incontinence, and urinary frequency. Derm:  Denies itching, lesion(s), poor wound healing, and rash. Neuro:  Complains of headaches; denies numbness and tingling. Psych:  Complains of anxiety and depression. Endo:  Denies cold intolerance, excessive thirst, excessive urination, and heat intolerance. Heme:  Denies abnormal bruising and bleeding.  Physical Exam  General:  overweight but generally well appearing  Head:  normocephalic, atraumatic, and no abnormalities observed.   Eyes:  vision grossly intact, pupils equal, pupils round, pupils reactive to light, and no injection.   Mouth:  pharynx pink and moist, no erythema, and no exudates.   Neck:  supple with full rom and no masses or thyromegally, no JVD or carotid bruit  Lungs:  Normal respiratory effort, chest expands symmetrically. Lungs are clear to auscultation, no crackles or wheezes. Heart:  Normal rate and regular rhythm. S1 and S2 normal without gallop, murmur, click, rub or other extra sounds. Abdomen:  no suprapubic tenderness or fullness felt  Msk:  No deformity or scoliosis noted of thoracic or lumbar spine.  no acute joint changes  no CVA tenderness  Pulses:  R and L carotid,radial,femoral,dorsalis pedis and posterior tibial pulses are full and equal bilaterally Extremities:  trace pedal edema  Neurologic:  sensation intact to light touch.   Skin:  Intact without suspicious lesions or rashes Cervical Nodes:  No lymphadenopathy noted Psych:  normal affect, talkative and pleasant  affect is brighter today   Impression & Recommendations:  Problem # 1:  HYPERGLYCEMIA (ICD-790.29) Assessment Unchanged  this continues to be well controlled with diet and exercise urged to keep working on wt loss    re check AIC 3 mo and f/u  Labs Reviewed: Creat: 1.4 (03/12/2009)     Problem # 2:  UTI'S, CHRONIC (ICD-599.0) Assessment: Unchanged  pt states the keflex gives her yeast more than other meds  will try to be easy on kidney with choice of prophylaxis  will try septra 400-80 with 1/2 pill per day and update  pt will call if yeast infx or utis re-occur she also plans on f/u with a gyn in the meantime The following medications were removed from the medication list:    Cephalexin 250 Mg Tabs (Cephalexin) .Marland Kitchen... Take 1 tablet by mouth once a day Her updated medication list for this problem includes:    Septra 400-80 Mg Tabs (Sulfamethoxazole-trimethoprim) .Marland Kitchen... 1/2 by mouth once daily  Orders: Prescription Created Electronically [phone])  Problem # 3:  RENAL INSUFFICIENCY (ICD-588.9) Assessment: Improved pending f/u note from Dr Hyman Hopes- per pt labs and studies neg aware to keep up her fluids and also avoid nsaids unless necessary will continue to follow check renal prof in 3 mo  Complete Medication List: 1)  Vitamin D 1000 Unit Tabs (Cholecalciferol) .... Take 1 tablet by mouth two times a day 2)  Astelin 137 Mcg/spray Soln (Azelastine hcl) .... Inhale 1 to 2 sprays in each nostril once to twice daily as needed for runny nose and drainage 3)  Patanol 0.1 % Soln (Olopatadine hcl) .... Place 1 drop in each eye 1 to 2 times daily as needed for itchy eyes 4)  Fluticasone Propionate 50 Mcg/act Susp (Fluticasone propionate) .... Use 2 spray in each nostril once daily as needed for stuffy nose or drainage 5)  Klor-con M20 20 Meq Tbcr (Potassium chloride crys cr) .... Take 2 tablest by mouth very 12 hours 6)  Simvastatin 40 Mg Tabs (Simvastatin) .... Take 1 tablet at bedtime 7)  Propranolol Hcl Cr 120 Mg Cp24 (Propranolol hcl) .... Take 1 capsule once daily 8)  Plavix 75 Mg Tabs (Clopidogrel bisulfate) .... Take 1 tablet once daily 9)  Cvs Aspirin 325 Mg Tabs (Aspirin) .... Take 1 tablet once  daily 10)  Omega-3 Fish Oil 1200 Mg Caps (Omega-3 fatty acids) .... Take 1 capsule by mouth two times a day 11)  Multivitamins Caps (Multiple vitamin) .... Take 2 tables by mouth once a day 12)  Calcium 1200 1200-1000 Mg-unit Chew (Calcium carbonate-vit d-min) .... Take 1 chew by mouth two times a day 13)  Ginkgo Biloba Extr (Ginkgo biloba) .... Take 1 tablet by mouth once a day (120 mg) 14)  Co Q-10 200mg  Caps (coenzyme Q10)  .... Take 1 tablet by mouth once a day 15)  Armour Thyroid 90 Mg Tabs (Thyroid) .... Once dailt 16)  Lasix 20 Mg Tabs (Furosemide) .... Take 1 tablet by mouth once a day 17)  K Dur 10 Meq  .... Take 1 tablet by mouth once daily 18)  Nitroglycerin 0.4 Mg Subl (Nitroglycerin) .... One tablet under tongue every 5 minutes as needed for chest pain---may repeat times three 19)  Protonix 40 Mg Tbec (Pantoprazole sodium) .Marland Kitchen.. 1 by mouth once daily 20)  Septra 400-80 Mg Tabs (Sulfamethoxazole-trimethoprim) .... 1/2 by mouth once daily  Patient Instructions: 1)  stop the keflex 2)  switch to septra 1/2 pill daily  3)  no other medicine changes  4)  drink water especially with heat / exercise 5)  keep up the great exercise 6)  schedule lab in 3 mo and follow up renal / AIC / ast /alt  for renal insufficiency and also hyperglycemia  Prescriptions: SEPTRA 400-80 MG TABS (SULFAMETHOXAZOLE-TRIMETHOPRIM) 1/2 by mouth once daily  #15 x 11   Entered and Authorized by:   Judith Part MD   Signed by:   Judith Part MD on 06/29/2009   Method used:   Electronically to        [facility] [phone]* (retail)       [address]       [place], [place]  [id number]       Ph: [phone] or [phone]       Fax: [phone]   RxID:   [id number]   Current Allergies (reviewed today): No known allergies

## 2010-02-26 NOTE — Letter (Signed)
Summary: [facility]  [facility]   Imported By: Lanelle Bal 09/21/2009 13:48:05  _____________________________________________________________________  External Attachment:    Type:   Image     Comment:   External Document

## 2010-02-26 NOTE — Assessment & Plan Note (Signed)
Summary: ROA FOR 3 MONTH FOLLOW-UP/JRR   Vital Signs:  Patient profile:   55 year old female Height:      67 inches Weight:      194.25 pounds BMI:     30.53 Temp:     97.5 degrees F oral Pulse rate:   64 / minute Pulse rhythm:   regular BP sitting:   100 / 68  (left arm) Cuff size:   large  Vitals Entered By: Lewanda Rife LPN (October 02, 2009 2:11 PM) CC: three month f/u   History of Present Illness: here to review chronic medical problems incl hyperglycemia / renal insufficiency/ cholesterol/ and chronic utis   wt is down 26 lb been really working on it and exercising  is watching her diet too - better than she was  did great until 2 weeks ago when her back pain got worse -- after making the wrong move  had to stop exercising  has not seen anyone for her back  pain is from low back and shoots down her leg -- surgery in the past   had back surgery in past -- a fusion  Dr Danielle Dess   cr is better at 1.2  good bp as usual   AIC 5.5 down from 5.8 which is excellent  chol is well controlled on statin  Last Lipid ProfileCholesterol: 119 (09/22/2008 2:13:42 PM)HDL:  37.70 (09/22/2008 2:13:42 PM)LDL:  57 (09/22/2008 2:13:42 PM)Triglycerides:  Last Liver profileSGOT:  27 (09/25/2009 11:10:30 AM)SPGT:  29 (09/25/2009 11:10:30 AM)T. Bili:  1.3 (09/22/2008 2:13:42 PM)Alk Phos:  48 (09/22/2008 2:13:42 PM)   off topamax changed to nortriptyline for headaches -- but she has not started it yet  headaches are persistant  is afraid of side effects  is tolerating headaches for now   on sulfa drug now for uti prophylaxis   spells of dizziness every so often - for a few months with vision blurriness  off topamax now for 2 months  went to see the eye doctor- new glasses did not help  did not tell the neurologist about that  occ fleeting pains   depression is about the same - not much of a change   wants to wait a little longer on flu shot   Allergies (verified): No Known Drug  Allergies  Past History:  Past Medical History: Last updated: 09/20/2008 Depression Eating disorder-- anorexia (better now) Headaches /migraine  GERD Allergies  arrythmia  CAD 2007 with stent (95% circumflex disease   treated with a Taxus stent, LAD with mild irregularities, right coronary   artery 30% stenosis, EF normal) Frequent utis-- on prophylaxis Edema (no HTN) Hyperlipidemia Vitamin D deficiency Microscopic hematuria with urol w/u  Restless leg disorder Urol-- Dr Ronal Fear Urol--Dr Wynelle Link Cardiol-- Dr Antoine Poche Neurol-- Dr Anne Hahn  Allergies-- Dr Sharyn Lull Gyn past -- Dr Randell Patient  Counselor-- Dr Farrel Demark Endo -- Talmage Nap  Past Surgical History: Last updated: 09/20/2008 Cholecystectomy Appendectomy  Hysterectomy- partial- then oophrectmy (ovarian cysts )  Lparoscopies for ovarian cysts Herniated disc low back 97-rapaired cystoscopy neg (Dr Ronal Fear) 9/09 CT abd/pelvis  Hyperthyroid - tx with rad iodine Hypothyroid after above tx Endo--Balan  Family History: Last updated: 09/20/2008 mother- CAD (cabg), DM, alzheimers in nsg home, thyroid problem, HTN father - does not know much about  2 brothers in good health  GF died of CAD , pancreatic ca uncles and aunts HTN GM with mental illness aunts with mental illness  uncle with throat cancer  uncle with brain tumor  several distant family members with melanoma  emphysema: paternal aunt allergies: brothers heart disease: mother, maternal grandfather cancer: maternal grandfather (pancreatic), paternal aunt (ovarian), paternal uncle (bone, brother (melanoma)   Social History: Last updated: 09/20/2008 is a widow- lost husb to colon ca 11 years ago  son 69 years old is currently single and living with son.  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 -only smoked for 1 year as a teenager Alcohol Use - yes-occasional Illicit Drug Use - no Patient does not get regular exercise.    Risk Factors: Exercise: no (08/29/2008)  Risk Factors: Smoking Status: quit (08/29/2008)  Review of Systems General:  Denies fatigue, fever, loss of appetite, and malaise. Eyes:  Denies blurring and eye irritation. CV:  Denies chest pain or discomfort, lightheadness, and palpitations. Resp:  Denies cough, shortness of breath, and wheezing. GI:  Denies abdominal pain, change in bowel habits, indigestion, and nausea. GU:  Denies dysuria and urinary frequency. MS:  Complains of low back pain; denies cramps and muscle weakness. Derm:  Denies itching, lesion(s), poor wound healing, and rash. Neuro:  Complains of headaches, poor balance, and sensation of room spinning; denies memory loss, numbness, and tingling. Psych:  Complains of anxiety and depression; denies suicidal thoughts/plans. Endo:  Denies excessive thirst and excessive urination. Heme:  Denies abnormal bruising and bleeding.  Physical Exam  General:  overweight but generally well appearing  Head:  normocephalic, atraumatic, and no abnormalities observed.  no sinus or temporal tenderness Eyes:  vision grossly intact, pupils equal, pupils round, and pupils reactive to light.  no conjunctival pallor, injection or icterus  Nose:  no nasal discharge.   Mouth:  pharynx pink and moist.   Neck:  supple with full rom and no masses or thyromegally, no JVD or carotid bruit  Lungs:  Normal respiratory effort, chest expands symmetrically. Lungs are clear to auscultation, no crackles or wheezes. Heart:  Normal rate and regular rhythm. S1 and S2 normal without gallop, murmur, click, rub or other extra sounds. Abdomen:  Bowel sounds positive,abdomen soft and non-tender without masses, organomegaly or hernias noted. no renal bruits  Msk:  No deformity or scoliosis noted of thoracic or lumbar spine.  no acute joing changes  Pulses:  R and L carotid,radial,femoral,dorsalis pedis and posterior tibial pulses are full and equal  bilaterally Extremities:  No clubbing, cyanosis, edema, or deformity noted with normal full range of motion of all joints.   Neurologic:  sensation intact to light touch, gait normal, and DTRs symmetrical and normal.   Skin:  Intact without suspicious lesions or rashes Cervical Nodes:  No lymphadenopathy noted Inguinal Nodes:  No significant adenopathy Psych:  improved affect - seems brighter at times and slt less anxious   Impression & Recommendations:  Problem # 1:  HYPERGLYCEMIA (ICD-790.29) Assessment Improved this continues to improve with healthy diet and exercsie and wt loss commended on this ! re check 6 mo and f/u  Problem # 2:  RENAL INSUFFICIENCY (ICD-588.9) Assessment: Improved improved with control of uti and also off topamax will continue to follow  re check 6 mo and f/u  Problem # 3:  HYPERLIPIDEMIA (ICD-272.4) Assessment: Unchanged  due for check at next visit tolerating statin well and her diet is better  Her updated medication list for this problem includes:    Simvastatin 40 Mg Tabs (Simvastatin) .Marland Kitchen... Take 1 tablet at bedtime  Labs Reviewed: SGOT: 27 (09/25/2009)   SGPT: 29 (  09/25/2009)  Prior 10 Yr Risk Heart Disease: N/A (09/20/2008)   HDL:37.70 (09/22/2008), 37.8 (08/05/2007)  LDL:57 (09/22/2008), 91 (08/05/2007)  Chol:119 (09/22/2008), 154 (08/05/2007)  Trig:124.0 (09/22/2008), 126 (08/05/2007)  Problem # 4:  COMMON MIGRAINE (ICD-346.10) Assessment: Deteriorated enc pt to think about the nortriptilyne (headache worse off topamax) I wonder if the intermittent dizziness/ vision change could be related to her headaches- she will disc this with her neurol  thankfully nl opthy exam and nl bp Her updated medication list for this problem includes:    Propranolol Hcl Cr 120 Mg Cp24 (Propranolol hcl) .Marland Kitchen... Take 1 capsule once daily    [facility] Aspirin 325 Mg Tabs (Aspirin) .Marland Kitchen... Take 1 tablet once daily  Problem # 5:  DEGENERATIVE DISC DISEASE, LUMBAR SPINE  (ICD-722.52) Assessment: Comment Only pt will f/u with Dr Danielle Dess if pain does not improve  Problem # 6:  DEPRESSIVE DISORDER (ICD-311) Assessment: Improved per pt the same - but I see imp in energy level and anxiety / some positive personality change  Complete Medication List: 1)  Vitamin D 1000 Unit Tabs (Cholecalciferol) .... Take 1 tablet by mouth two times a day 2)  Astelin 137 Mcg/spray Soln (Azelastine hcl) .... Inhale 1 to 2 sprays in each nostril once to twice daily as needed for runny nose and drainage 3)  Patanol 0.1 % Soln (Olopatadine hcl) .... Place 1 drop in each eye 1 to 2 times daily as needed for itchy eyes 4)  Fluticasone Propionate 50 Mcg/act Susp (Fluticasone propionate) .... Use 2 spray in each nostril once daily as needed for stuffy nose or drainage 5)  Klor-con M20 20 Meq Tbcr (Potassium chloride crys cr) .... Take 2 tablest by mouth very 12 hours 6)  Simvastatin 40 Mg Tabs (Simvastatin) .... Take 1 tablet at bedtime 7)  Propranolol Hcl Cr 120 Mg Cp24 (Propranolol hcl) .... Take 1 capsule once daily 8)  Plavix 75 Mg Tabs (Clopidogrel bisulfate) .... Take 1 tablet once daily 9)  [facility] Aspirin 325 Mg Tabs (Aspirin) .... Take 1 tablet once daily 10)  Omega-3 Fish Oil 1200 Mg Caps (Omega-3 fatty acids) .... Take 1 capsule by mouth two times a day 11)  Multivitamins Caps (Multiple vitamin) .... Take 2 tables by mouth once a day 12)  Calcium 1200 1200-1000 Mg-unit Chew (Calcium carbonate-vit d-min) .... Take 1 chew by mouth two times a day 13)  Ginkgo Biloba Extr (Ginkgo biloba) .... Take 1 tablet by mouth once a day (120 mg) 14)  Co Q-10 200mg  Caps (coenzyme Q10)  .... Take 1 tablet by mouth once a day 15)  Armour Thyroid 90 Mg Tabs (Thyroid) .... Once dailt 16)  Lasix 20 Mg Tabs (Furosemide) .... Take 1 tablet by mouth once a day 17)  Klor-con 10 10 Meq Cr-tabs (Potassium chloride) .Marland Kitchen.. 1 by mouth once daily 18)  Nitroglycerin 0.4 Mg Subl (Nitroglycerin) .... One tablet  under tongue every 5 minutes as needed for chest pain---may repeat times three 19)  Protonix 40 Mg Tbec (Pantoprazole sodium) .Marland Kitchen.. 1 by mouth once daily 20)  Septra 400-80 Mg Tabs (Sulfamethoxazole-trimethoprim) .... 1/2 by mouth once daily  Patient Instructions: 1)  keep up the great job with wt loss - healthy diet and exercise  2)  talk to your neurologist about the dizziness and vision change - these may be related to your headaches 3)  see Dr Danielle Dess if back pain does not improve  4)  your kidney function is better as is your sugar  control -- good work! 5)  please schedule fasting labs in 6 months and then follow up lipid/ast/alt / renal / AIC 250.0, 272   Current Allergies (reviewed today): No known allergies

## 2010-02-26 NOTE — Progress Notes (Signed)
Summary: Rx Klor-Con  Phone Note Refill Request Call back at [phone] Message from:  [facility] on March 13, 2009 4:29 PM  Refills Requested: Medication #1:  K DUR 10 MEQ Take 1 tablet by mouth once a day   Last Refilled: 02/08/2009 Received faxed refill request, directions are unclear.  Refill request says take one tablet daily, medication list says two tablets every 12 hours.  Called patient and left a message for her to return call.  Linde Gillis CMA [facility] Dull)  March 13, 2009 4:31 PM   Patient called back and stated that she takes Klor-Con one tablet twice daily, and Klor-Con two tablets twice daily.     Method Requested: Electronic Initial call taken by: Linde Gillis CMA [facility] Dull),  March 13, 2009 4:37 PM  Follow-up for Phone Call        thanks for the clarification please verify one more time with the patient  20 meq 2 two times a day  10 meq 1 two times a day  even have her look at her bottles to be ABSOLUTELY SURE this is total of 50 meq of K two times a day  her level is fine - so if this is what she is taking - will continue it  px written on EMR for call in  if this is not correct let me know Follow-up by: Judith Part MD,  March 13, 2009 5:33 PM  Additional Follow-up for Phone Call Additional follow up Details #1::        Left message for patient to call back. Lewanda Rife LPN  March 14, 2009 11:56 AM   I spoke with pt and she takes  KDur take one daily and KlorCon take two twice a day. Please advise. Lewanda Rife LPN  March 14, 2009 3:31 PM   ok- will switch it again px written on EMR for call in  Additional Follow-up by: Judith Part MD,  March 14, 2009 5:34 PM    Additional Follow-up for Phone Call Additional follow up Details #2::    [facility] as instructed. Lewanda Rife LPN  March 14, 2009 5:41 PM   New/Updated Medications: * K DUR 10 MEQ Take 1 tablet by mouth two times  a day * K DUR 10 MEQ Take 1 tablet by mouth once daily Prescriptions: K DUR 10 MEQ Take 1 tablet by mouth once daily  #30 x 5   Entered and Authorized by:   Judith Part MD   Signed by:   Lewanda Rife LPN on 40/34/7425   Method used:   Telephoned to ...       [facility] [address] [phone]* (retail)       [address]       [place], [place]  [address]       Ph: [phone] or [phone]       Fax: [phone]   RxID:   [id number] K DUR 10 MEQ Take 1 tablet by mouth two times a day  #60 x 5   Entered and Authorized by:   Judith Part MD   Signed by:   Lewanda Rife LPN on 61/60/7371   Method used:   Telephoned to ...       [facility] [address] [phone]* (retail)       [address]  [place], [place]  [address]       Ph: [phone] or [phone]       Fax: [phone]   RxID:   [id number] KLOR-CON M20 20 MEQ  TBCR (POTASSIUM CHLORIDE CRYS CR) take 2 tablest by mouth very 12 hours  #120 x 5   Entered and Authorized by:   Judith Part MD   Signed by:   Lewanda Rife LPN on 36/64/4034   Method used:   Telephoned to ...       [facility] [address] [phone]* (retail)       [address]       [place], [place]  [address]       Ph: [phone] or [phone]       Fax: [phone]   RxID:   [id number]

## 2010-02-26 NOTE — Progress Notes (Signed)
Summary: Refill request  Phone Note Refill Request Message from:  [facility] on September 04, 2009 8:32 AM  Refills Requested: Medication #1:  KLOR-CON M20 20 MEQ  TBCR take 2 tablest by mouth very 12 hours   Last Refilled: 02-2009 Received e-fill on Klor-Con 10 mEq once daily. Med list shows Klor-con 20 mEq two times a day and K-dur 10 once daily. Please advise  Initial call taken by: Janee Morn CMA [facility] Dull),  September 04, 2009 8:32 AM     Appended Document: Refill request please verify with pt what she is taking and how often before I refil- thanks  Appended Document: Refill request Left message on cell phone voicemail for patient to return call.  Appended Document: Refill request Left message for patient to call back.   Appended Document: Refill request Pt states that she is taking two 20 meq's twice a day and one 10 meq's daily  Appended Document: Refill request Medications Added KLOR-CON M20 20 MEQ  TBCR (POTASSIUM CHLORIDE CRYS CR) take 2 tablest by mouth very 12 hours KLOR-CON 10 10 MEQ CR-TABS (POTASSIUM CHLORIDE) 1 by mouth once daily       thanks px written on EMR for call in  I changed it all to Klor con for continuity--should not make a difference  [facility] [address] [facility](spoke with Trinna Post) as instructed.Lewanda Rife LPN  September 07, 2009 2:52 PM    Clinical Lists Changes  Medications: Changed medication from KLOR-CON M20 20 MEQ  TBCR (POTASSIUM CHLORIDE CRYS CR) take 2 tablest by mouth very 12 hours to KLOR-CON M20 20 MEQ  TBCR (POTASSIUM CHLORIDE CRYS CR) take 2 tablest by mouth very 12 hours - Signed Changed medication from * K DUR 10 MEQ Take 1 tablet by mouth once daily to KLOR-CON 10 10 MEQ CR-TABS (POTASSIUM CHLORIDE) 1 by mouth once daily - Signed Rx of KLOR-CON M20 20 MEQ  TBCR (POTASSIUM CHLORIDE CRYS CR) take 2 tablest by mouth very 12 hours;  #120 x 5;  Signed;  Entered by: Judith Part MD;  Authorized by: Judith Part  MD;  Method used: [facility] to [facility] [phone]*, [address] [place], [place], [place]  [address], Ph: [phone] or [phone], Fax: [phone] Rx of KLOR-CON 10 10 MEQ CR-TABS (POTASSIUM CHLORIDE) 1 by mouth once daily;  #30 x 5;  Signed;  Entered by: Judith Part MD;  Authorized by: Judith Part MD;  Method used: Telephoned to [facility] [phone]*, [address] [place], [place], [place]  [address], Ph: [phone] or [phone], Fax: [phone]    Prescriptions: KLOR-CON 10 10 MEQ CR-TABS (POTASSIUM CHLORIDE) 1 by mouth once daily  #30 x 5   Entered and Authorized by:   Judith Part MD   Signed by:   Lewanda Rife LPN on 23/55/7322   Method used:   Telephoned to ...       [facility] [address] [phone]* (retail)       [address]       [place], [place]  [address]       Ph: [phone] or [phone]       Fax: [phone]   RxID:   [id number] KLOR-CON M20 20 MEQ  TBCR (POTASSIUM CHLORIDE CRYS CR) take 2 tablest by mouth very 12 hours  #120 x 5   Entered and Authorized  by:   Judith Part MD   Signed by:   Lewanda Rife LPN on 16/10/9602   Method used:   Telephoned to ...       [facility] [phone]* (retail)       [address]       [place], [place]  [address]       Ph: [phone] or [phone]       Fax: [phone]   RxID:   [id number]

## 2010-02-28 NOTE — Progress Notes (Signed)
Summary: refill request for klorcon  Phone Note Refill Request Message from:  Fax from Pharmacy  Refills Requested: Medication #1:  KLOR-CON 10 10 MEQ CR-TABS 1 by mouth once daily Faxed request from [facility] is on your shelf.  Is pt taking bothe 10 and 20 meq's?  Initial call taken by: Lowella Petties CMA, AAMA,  February 13, 2010 9:46 AM  Follow-up for Phone Call        I think she is but could you please call her and verify that? -- I will hold form on my desk -thanks Follow-up by: Judith Part MD,  February 13, 2010 11:23 AM  Additional Follow-up for Phone Call Additional follow up Details #1::        Spoke with patient. She is taking 4 of the 20's and 1 of the 10 every day.  thanks - that is what I thought - just wanted to verify form done and in nurse in box  Additional Follow-up by: Janee Morn CMA [facility] Dull),  February 13, 2010 12:09 PM    Additional Follow-up for Phone Call Additional follow up Details #2::    Form faxed.  Follow-up by: Janee Morn CMA [facility] Dull),  February 13, 2010 2:51 PM  Prescriptions: KLOR-CON 10 10 MEQ CR-TABS (POTASSIUM CHLORIDE) 1 by mouth once daily  #90 x 3   Entered and Authorized by:   Judith Part MD   Signed by:   Selena Batten Dance CMA (AAMA) on 02/13/2010   Method used:   Historical   RxID:   [id number]

## 2010-03-01 NOTE — Letter (Signed)
Summary: [facility]  [facility]   Imported By: Lanelle Bal 03/05/2009 11:56:13  _____________________________________________________________________  External Attachment:    Type:   Image     Comment:   External Document

## 2010-03-28 ENCOUNTER — Other Ambulatory Visit (INDEPENDENT_AMBULATORY_CARE_PROVIDER_SITE_OTHER): Payer: Medicare Other

## 2010-03-28 ENCOUNTER — Other Ambulatory Visit: Payer: Self-pay | Admitting: Family Medicine

## 2010-03-28 ENCOUNTER — Encounter (INDEPENDENT_AMBULATORY_CARE_PROVIDER_SITE_OTHER): Payer: Self-pay | Admitting: *Deleted

## 2010-03-28 DIAGNOSIS — E785 Hyperlipidemia, unspecified: Secondary | ICD-10-CM

## 2010-03-28 DIAGNOSIS — R7309 Other abnormal glucose: Secondary | ICD-10-CM

## 2010-03-28 LAB — LIPID PANEL
HDL: 37.6 mg/dL — ABNORMAL LOW (ref >39.00)
Triglycerides: 178 mg/dL — ABNORMAL HIGH (ref 0.0–149.0)
VLDL: 35.6 mg/dL (ref 0.0–40.0)

## 2010-03-28 LAB — RENAL FUNCTION PANEL
Albumin: 4.1 g/dL (ref 3.5–5.2)
BUN: 24 mg/dL — ABNORMAL HIGH (ref 6–23)
CO2: 28 mEq/L (ref 19–32)
Calcium: 9.5 mg/dL (ref 8.4–10.5)
Chloride: 103 mEq/L (ref 96–112)
Phosphorus: 3.4 mg/dL (ref 2.3–4.6)

## 2010-03-28 LAB — ALT: ALT: 33 U/L (ref 0–35)

## 2010-04-02 ENCOUNTER — Ambulatory Visit (INDEPENDENT_AMBULATORY_CARE_PROVIDER_SITE_OTHER): Payer: Medicare Other | Admitting: Family Medicine

## 2010-04-02 ENCOUNTER — Encounter: Payer: Self-pay | Admitting: Family Medicine

## 2010-04-02 DIAGNOSIS — N259 Disorder resulting from impaired renal tubular function, unspecified: Secondary | ICD-10-CM

## 2010-04-02 DIAGNOSIS — E785 Hyperlipidemia, unspecified: Secondary | ICD-10-CM

## 2010-04-02 DIAGNOSIS — R609 Edema, unspecified: Secondary | ICD-10-CM

## 2010-04-02 DIAGNOSIS — R7309 Other abnormal glucose: Secondary | ICD-10-CM

## 2010-04-16 NOTE — Assessment & Plan Note (Signed)
Summary: f/u after labs//lfw   Vital Signs:  Patient profile:   55 year old female Height:      67 inches Weight:      221.25 pounds BMI:     34.78 Temp:     98 degrees F oral Pulse rate:   60 / minute Pulse rhythm:   regular BP sitting:   104 / 68  (left arm) Cuff size:   large  Vitals Entered By: Lewanda Rife LPN (April 01, 1608 3:39 PM) CC: six month f/u after labs   History of Present Illness: here  for f/u of lipids and renal insuff and hyperglycemia   has been feeling so/ so   wt is up 27 lb!  this is since stopping the topamax (Dr Anne Hahn put her on nortryptiline and that did  not help and she stopped it )  migraines are much worse - wants to start the topamax  it did not give her depression  stays constantly hungry   104/68 good bp  lipids are fairly stable - trig up a bit Last Lipid ProfileCholesterol: 131 (03/28/2010 12:11:55 PM)HDL:  37.60 (03/28/2010 12:11:55 PM)LDL:  58 (03/28/2010 12:11:55 PM)Triglycerides:  Last Liver profileSGOT:  26 (03/28/2010 12:11:55 PM)SPGT:  33 (03/28/2010 12:11:55 PM)T. Bili:  1.3 (09/22/2008 2:13:42 PM)Alk Phos:  48 (09/22/2008 2:13:42 PM)   cr is stable at 1.2 -- has been holding there  AIC is 5.9 up from 5.5   Allergies (verified): No Known Drug Allergies  Past History:  Past Medical History: Last updated: 09/20/2008 Depression Eating disorder-- anorexia (better now) Headaches /migraine  GERD Allergies  arrythmia  CAD 2007 with stent (95% circumflex disease   treated with a Taxus stent, LAD with mild irregularities, right coronary   artery 30% stenosis, EF normal) Frequent utis-- on prophylaxis Edema (no HTN) Hyperlipidemia Vitamin D deficiency Microscopic hematuria with urol w/u  Restless leg disorder Urol-- Dr Ronal Fear Urol--Dr Wynelle Link Cardiol-- Dr Antoine Poche Neurol-- Dr Anne Hahn  Allergies-- Dr Octavia Bruckner past -- Dr Randell Patient  Counselor-- Dr Farrel Demark Endo -- Talmage Nap  Past Surgical History: Last updated:  09/20/2008 Cholecystectomy Appendectomy  Hysterectomy- partial- then oophrectmy (ovarian cysts )  Lparoscopies for ovarian cysts Herniated disc low back 97-rapaired cystoscopy neg (Dr Ronal Fear) 9/09 CT abd/pelvis  Hyperthyroid - tx with rad iodine Hypothyroid after above tx Endo--Balan  Family History: Last updated: 09/20/2008 mother- CAD (cabg), DM, alzheimers in nsg home, thyroid problem, HTN father - does not know much about  2 brothers in good health  GF died of CAD , pancreatic ca uncles and aunts HTN GM with mental illness aunts with mental illness  uncle with throat cancer  uncle with brain tumor  several distant family members with melanoma  emphysema: paternal aunt allergies: brothers heart disease: mother, maternal grandfather cancer: maternal grandfather (pancreatic), paternal aunt (ovarian), paternal uncle (bone, brother (melanoma)   Social History: Last updated: 09/20/2008 is a widow- lost husb to colon ca 11 years ago  son 56 years old is currently single and living with son.  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 -only smoked for 1 year as a teenager Alcohol Use - yes-occasional Illicit Drug Use - no Patient does not get regular exercise.   Risk Factors: Exercise: no (08/29/2008)  Risk Factors: Smoking Status: quit (08/29/2008)  Review of Systems General:  Complains of fatigue; denies fever, loss of appetite, and malaise. Eyes:  Denies blurring and eye irritation. CV:  Denies chest pain or discomfort, lightheadness, and palpitations. Resp:  Denies cough, shortness of breath, and wheezing. GI:  Denies abdominal pain, change in bowel habits, indigestion, and nausea. GU:  Denies dysuria, hematuria, and urinary frequency. MS:  Denies joint redness, joint swelling, and stiffness. Derm:  Denies itching, lesion(s), poor wound healing, and rash. Neuro:  Complains of headaches; denies difficulty with  concentration, disturbances in coordination, falling down, numbness, and tingling. Psych:  Denies anxiety and depression. Endo:  Denies cold intolerance, excessive thirst, excessive urination, and heat intolerance. Heme:  Denies abnormal bruising and bleeding.  Physical Exam  General:  overweight but generally well appearing  Head:  normocephalic, atraumatic, and no abnormalities observed.  no sinus or temporal tenderness Eyes:  vision grossly intact, pupils equal, pupils round, and pupils reactive to light.  no conjunctival pallor, injection or icterus  Ears:  R ear normal and no external deformities.   Mouth:  pharynx pink and moist.   Neck:  supple with full rom and no masses or thyromegally, no JVD or carotid bruit  Chest Wall:  No deformities, masses, or tenderness noted. Lungs:  Normal respiratory effort, chest expands symmetrically. Lungs are clear to auscultation, no crackles or wheezes. Heart:  Normal rate and regular rhythm. S1 and S2 normal without gallop, murmur, click, rub or other extra sounds. Abdomen:  Bowel sounds positive,abdomen soft and non-tender without masses, organomegaly or hernias noted. no renal bruits  Msk:  No deformity or scoliosis noted of thoracic or lumbar spine.  no acute joing changes  Pulses:  R and L carotid,radial,femoral,dorsalis pedis and posterior tibial pulses are full and equal bilaterally Extremities:  No clubbing, cyanosis, edema, or deformity noted with normal full range of motion of all joints.   Neurologic:  sensation intact to light touch, gait normal, and DTRs symmetrical and normal.  alert & oriented X3 and cranial nerves II-XII intact.   Skin:  Intact without suspicious lesions or rashes Cervical Nodes:  No lymphadenopathy noted Inguinal Nodes:  No significant adenopathy Psych:  improved affect - seems brighter at times and slt less anxious   Impression & Recommendations:  Problem # 1:  HYPERGLYCEMIA (ICD-790.29) Assessment  Deteriorated  this is worse with recent wt gain  disc the relationship between obesity and DM today plan made for wt loss  lab and f/u 6 mo  exercise when able  Labs Reviewed: Creat: 1.2 (03/28/2010)     Problem # 2:  RENAL INSUFFICIENCY (ICD-588.9) Assessment: Unchanged this is quite stable  pt wants to try topamax again -- because it helped headaches and appetite so much  she will contact her neurologist- and if she re starts it we will check renal panel 1 mo later   Problem # 3:  COMMON MIGRAINE (ICD-346.10) Assessment: Deteriorated this is worse not helped by nortryptiline  pt will call her neurologist about considering topamax again - watching her renal fxn carefully hope she will be able to exercise with better ha control Her updated medication list for this problem includes:    Propranolol Hcl Cr 120 Mg Cp24 (Propranolol hcl) .Marland Kitchen... Take 1 capsule once daily    Cvs Aspirin 325 Mg Tabs (Aspirin) .Marland Kitchen... Take 1 tablet once daily  Problem # 4:  HYPERLIPIDEMIA (ICD-272.4) Assessment: Unchanged  this is well controlled some inc in trig with sugar disc low sugar/ sat fat diet  lab and f/u6 mo  continue simvastatin Her updated medication list for this problem includes:    Simvastatin 40 Mg Tabs (  Simvastatin) .Marland Kitchen... Take 1 tablet at bedtime  Labs Reviewed: SGOT: 26 (03/28/2010)   SGPT: 33 (03/28/2010)  Prior 10 Yr Risk Heart Disease: N/A (09/20/2008)   HDL:37.60 (03/28/2010), 37.70 (09/22/2008)  LDL:58 (03/28/2010), 57 (09/22/2008)  Chol:131 (03/28/2010), 119 (09/22/2008)  Trig:178.0 (03/28/2010), 124.0 (09/22/2008)  Complete Medication List: 1)  Astelin 137 Mcg/spray Soln (Azelastine hcl) .... Inhale 1 to 2 sprays in each nostril once to twice daily as needed for runny nose and drainage 2)  Patanol 0.1 % Soln (Olopatadine hcl) .... Place 1 drop in each eye 1 to 2 times daily as needed for itchy eyes 3)  Klor-con M20 20 Meq Tbcr (Potassium chloride crys cr) .... Take 2  tablest by mouth very 12 hours 4)  Simvastatin 40 Mg Tabs (Simvastatin) .... Take 1 tablet at bedtime 5)  Propranolol Hcl Cr 120 Mg Cp24 (Propranolol hcl) .... Take 1 capsule once daily 6)  Plavix 75 Mg Tabs (Clopidogrel bisulfate) .... Take 1 tablet once daily 7)  Cvs Aspirin 325 Mg Tabs (Aspirin) .... Take 1 tablet once daily 8)  Omega-3 Fish Oil 1200 Mg Caps (Omega-3 fatty acids) .... Take 1 capsule by mouth two times a day 9)  Multivitamins Caps (Multiple vitamin) .... Take 1 tablet by mouth once a day 10)  Calcium 1200 1200-1000 Mg-unit Chew (Calcium carbonate-vit d-min) .... Take 1 chew by mouth two times a day 11)  Ginkgo Biloba Extr (Ginkgo biloba) .... Take 1 tablet by mouth once a day (120 mg) 12)  Co Q-10 200mg  Caps (coenzyme Q10)  .... Take 1 tablet by mouth once a day 13)  Lasix 20 Mg Tabs (Furosemide) .... Take 1 tablet by mouth once a day 14)  Klor-con 10 10 Meq Cr-tabs (Potassium chloride) .Marland Kitchen.. 1 by mouth once daily 15)  Nitroglycerin 0.4 Mg Subl (Nitroglycerin) .... One tablet under tongue every 5 minutes as needed for chest pain---may repeat times three 16)  Armour Thyroid 30 Mg Tabs (Thyroid) .... Three tablets by mouth daily 17)  Omnaris 50 Mcg/act Susp (Ciclesonide) .... 2 sprays each nostril daily 18)  Ipratropium Br Nsl Spray 15ml  .... 2 sprays each nostril daily 19)  Ranitidine Hcl 300 Mg Tabs (Ranitidine hcl) .... Take 1 tablet by mouth once a day 20)  Cephalexin 250 Mg Caps (Cephalexin) .... Take 1 capsule by mouth once a day  Patient Instructions: 1)  if you want to re start the topamax -- call back to schedule labs for renal panel about a month after starting it  2)  get back to your exercise when headaches are well enough controlled to do so 3)  no soda / no sweets 4)  increase water intake  5)  schedule follow up in 6 months with labs prior    Orders Added: 1)  Est. Patient Level IV [16109]    Current Allergies (reviewed today): No known allergies

## 2010-06-10 ENCOUNTER — Other Ambulatory Visit: Payer: Self-pay | Admitting: Cardiology

## 2010-06-11 NOTE — Assessment & Plan Note (Signed)
[facility]                            CARDIOLOGY OFFICE NOTE   LOREDANA, MEDELLIN                        MRN:          [id number]  DATE:09/07/2006                            DOB:          [birth year]    PRIMARY:  Windle Guard, M.D.   REASON FOR PRESENTATION:  Evaluate the patient with coronary artery  disease and chest pain.   HISTORY OF PRESENT ILLNESS:  The patient is a 55 year old white female  with a history of coronary artery disease as described below.  She  presents for yearly followup.  She says that she has been having chest  discomfort over the last few weeks.  This has been happening every few  days.  It seems to be happening with exertion.  It is under her left  breast.  It radiates up to the center of her chest.  It stops after she  stops what she is doing.  She has not taken nitroglycerin because she is  afraid of getting a headache and a migraine.  It goes away  spontaneously.  There are no associated symptoms such as nausea,  vomiting, or diaphoresis.  She has had no palpitations, pre-syncope,  syncope.  She denies any PND or orthopnea.  She does not have any  radiation to her neck or to her arms.  She is not sure whether this is  similar to previous discomfort.   She has been limiting her activities because she has had progressive leg  pain, numbness, and cramping.  She has had this sporadically, but not to  this degree.  She is not describing any new swelling.  She has not had  any focal motor changes.   PAST MEDICAL HISTORY:  Coronary artery disease (95% circumflex disease  treated with a TAXUS stent, LAD with mild irregularities, right coronary  artery 30% stenosis, EF normal).  Migraines.  Dyslipidemia.  Gastroesophageal reflux disease.  Back surgery.  Cholecystectomy.  Appendectomy.  Partial hysterectomy.  Ovarian cyst resected.   ALLERGIES:  None.   MEDICATIONS:  1. Vitamin D.  2. Triamterine hydrochlorothiazide  75/50.  3. Metioprim.  4. Propranolol 120 mg daily.  5. Klor-Con 40 mEq b.i.d.  6. Plavix 75 mg daily.  7. Protonix 40 mg b.i.d.  8. Topamax 100 mg b.i.d.  9. Omega 3 fatty acid.  10.Platinol.  11.Nasonex.  12.Astelin.  13.Simvastatin 40 mg daily.   REVIEW OF SYSTEMS:  As stated in the HPI and otherwise negative for  other systems.   PHYSICAL EXAMINATION:  The patient is in no distress.  She has a very  flat, depressed affect.  Blood pressure is 81/70, heart rate 65 and regular, weight 197 pounds,  body mass index 29.  HEENT:  Eyelids unremarkable.  Pupils are equal, round, and reactive to  light and accommodation.  Fundi not visualized.  Oral mucosa  unremarkable.  NECK:  No jugular venous distension at 45 degrees.  Carotid upstroke  brisk and symmetric, no bruits, thyromegaly.  LYMPHATICS:  No cervical, axillary, or inguinal adenopathy.  LUNGS:  Clear to auscultation bilaterally.  BACK:  No costovertebral angle tenderness.  CHEST:  Unremarkable.  HEART:  PMI not displaced or sustained, S1 and S2 within normal limits,  no S3, no S4, no clicks, rubs, murmurs.  ABDOMEN:  Mildly obese, positive bowel sounds, normal in frequency and  pitch, no bruits, rebound, guarding.  No midline pulsatile masses,  hepatomegaly, splenomegaly.  SKIN:  No rashes, no nodules.  EXTREMITIES:  With 2+ pulses throughout, no edema, cyanosis, clubbing.  NEURO:  Oriented to person, place, and time, cranial nerves 2-12 grossly  intact, motor grossly intact.   ELECTROCARDIOGRAM:  Sinus rhythm, rate 61, left axis deviation.  Poor  anterior R wave progression.  No acute ST-T wave changes.   ASSESSMENT AND PLAN:  1. Chest discomfort.  The patient is describing chest discomfort that      is new onset in the last 3 weeks.  It is somewhat atypical in its      nature.  However, given her past history, the pretest probability      of obstructive coronary artery disease is at least moderately high.      She  was not able to walk on a treadmill last year as her blood      pressure fell.  I am going to do an adenosine Cardiolite.  She is      going to hold her triamterine hydrochlorothiazide the night before      and her propranolol.  Further evaluation will be based on these      results.  2. Hypotension.  The patient's blood pressure is low.  She takes the      Maxzide for volume retention, especially in her feet.  She refuses      to come off of this, though I said this is contributing to her      lower blood pressure.  She takes propranolol for migraines and will      not stop this.  This is contributing to low blood pressure as well.      She can discuss this with Dr. Jeannetta Nap to see if she might reduce the      dose going forward.  3. Dyslipidemia.  She is having some muscle aches that could be      related to the simvastatin.  I am going to have her stop the drug      for 3 weeks, though I am going to get a lipid profile before doing      that.  If her symptoms go away, she will re-challenge to see if      they return for the simvastatin.  If we prove it is the      simvastatin, then I will have to switch to a different Statin      medication.  If her discomfort does not go away as she comes off of      the simvastatin, then it is unlikely to be this drug and she will      resume it.  4. Followup, we will see her back in 1 year, or sooner if she has      increasing symptoms or any abnormalities based on the stress test.     Rollene Rotunda, MD, [facility]  Electronically Signed    JH/MedQ  DD: 09/07/2006  DT: 09/08/2006  Job #: [id number]   cc:   Windle Guard, M.D.

## 2010-06-11 NOTE — Assessment & Plan Note (Signed)
[facility]                            CARDIOLOGY OFFICE NOTE   [name], [name]                        MRN:          [id number]  DATE:08/31/2007                            DOB:          02-07-1955    REASON FOR PRESENTATION:  Coronary disease.   HISTORY OF PRESENT ILLNESS:  The patient presents for yearly followup.  She is now 55 years old.  Since I last saw her, she has had increasing  stress.  Her mother who has Alzheimer's, is now in a nursing home.  She  thinks the nursing home care is not very good and so she is having deal  with that.  She says she is always under stress.  She says she is always  on her feet and never has any downtime.  She says that she feels chest  discomfort with emotional stress.  This is substernal.  She gets  headaches with this.  She feels like she cannot take a deep breath or  when she does get a deep breath in that she will not be able to get it  out.  She feels very anxious.  She, however, can do such things as  vacuuming, which is probably her most exerting thing without bringing on  any of these symptoms.  It more seems to happen with emotional stress.  In reviewing this and previous description, her symptoms sound very  similar to those reported in August 2008.  The patient agrees that it  does seem similar, there may be more intense.  At that time, she did  have a stress perfusion study, which was a low-risk scan.  We managed  her medically.  Her ejection fraction at that time was 73%.  She had  mild apical thinning.   PAST MEDICAL HISTORY:  Coronary artery disease (95% circumflex disease  treated with a Taxus stent, LAD with mild irregularities, right coronary  artery 30% stenosis, EF normal), migraines, dyslipidemia,  gastroesophageal reflux disease, anxiety (she is under the care of a  psychologist), back surgery, cholecystectomy, appendectomy, partial  hysterectomy, and ovarian cyst resected.   ALLERGIES:  None.   MEDICATIONS:  1. Plavix 75 mg daily.  2. Potassium 40 mEq b.i.d.  3. Pranolol 20 mg daily.  4. Trimethoprim 100 mg daily.  5. Triamterene and hydrochlorothiazide 75/50 daily.  6. Vitamin D 1.25 two times every 2 weeks.  7. Simvastatin 40 mg at bedtime.  8. Fluticasone.   REVIEW OF SYSTEMS:  As stated in the HPI and otherwise negative for  other systems.   PHYSICAL EXAMINATION:  GENERAL:  The patient is in no distress.  VITAL SIGNS:  Her blood pressure 112/73, heart rate 61 and regular,  weight 208 pounds, and body mass index 30.  HEENT:  Eyelids are unremarkable, pupils equal, round, and reactive to  light, fundi not visualized, and oral mucosa normal.  NECK:  No jugular venous distention, 45 degrees.  Carotid upstroke brisk  and symmetrical.  No bruits and no thyromegaly. LYMPHATICS:  No  cervical, axillary, or inguinal nodes.  LUNGS:  Clear to auscultation  bilaterally.  BACK:  No costovertebral angle tenderness.  CHEST:  Unremarkable.  HEART:  PMI not displaced or sustained, S1 and S2 within normal.  No S3  and S4.  No clicks.  No rubs.  No murmurs.  ABDOMEN:  Obese, positive  bowel sounds normal in frequency and pitch.  No bruits, no rebound, no  guarding, no midline pulsatile mass, no hepatomegaly, and no  splenomegaly.  SKIN:  No rashes.  No nodules.  EXTREMITIES:  Pulses 2+ throughout.  No edema, cyanosis, or clubbing.  NEUROLOGIC:  Oriented to person, place, and time.  Cranial nerves II-XII  grossly intact.  Motor grossly intact.   EKG sinus rhythm, rate 64, axis within normal limits, intervals within  normal limits, poor anterior R wave progression, and no acute ST-wave  changes.   ASSESSMENT AND PLAN:  1. Chest.  The patient's chest discomfort is the same description that      she gave last year.  It does not happen with physical exertion but      only emotional stress.  Last year, she had a stress perfusion      study, which demonstrated no  high-risk features.  She has managed      medically.  At this point, I do not think further stress testing is      indicated unless she reports to me some change in her symptom      complex.  2. Hypotension.  This is a problem of last year but her blood pressure      seems to be reasonable today.  She has had no presyncope or      syncope.  No further evaluation is warranted.  3. Dyslipidemia.  I reviewed her lipid profile done by Dr. Milinda Antis and      that is acceptable on the present meds and she will continue these.  4. Anxiety.  I suggest that she continue to follow with a psychologist      and consider medical therapy as well given her high level of stress      and anxiety and symptoms associated.  5. Followup.  She is to come back and see me if she has any change in      her symptom complex.  If she has any unstable symptoms that do not      go away with rest and relaxation techniques, she should go to the      emergency room.  Otherwise, I will see her in 1 year.     Rollene Rotunda, MD, [facility]  Electronically Signed    JH/MedQ  DD: 08/31/2007  DT: 09/01/2007  Job #: [id number]   cc:   Marne A. Milinda Antis, MD

## 2010-06-11 NOTE — Procedures (Signed)
NAMELAURIAN, [name] [name] NO.:  [id number]   MEDICAL RECORD NO.:  [id number]          PATIENT TYPE:  OUT   LOCATION:  SLEEP CENTER                 FACILITY[facility]  [facility]   PHYSICIAN:  Barbaraann Share, MD,FCCPDATE OF BIRTH:  10/26/1955   DATE OF STUDY:  07/16/2008                            NOCTURNAL POLYSOMNOGRAM   REFERRING PHYSICIAN:  Barbaraann Share, MD,FCCP   REFERRING PHYSICIAN:  Barbaraann Share, MD, FCCP   INDICATION FOR STUDY:  Hypersomnia with sleep apnea.   EPWORTH SLEEPINESS SCORE:  14.   MEDICATIONS:   SLEEP ARCHITECTURE:  The patient had total sleep time of 257 minutes  with no slow wave sleep and only 19 minutes of REM.  Sleep onset latency  was mildly prolonged at 36 minutes, and REM onset was at the upper  limits of normal at 111 minutes.  Sleep efficiency was poor at 61%.   RESPIRATORY DATA:  The patient was found to have 13 obstructive  hypopneas and no apneas, giving her an AHI of only 3 events per hour.  The events occurred in all body positions, and there was mild snoring  noted throughout.   OXYGEN DATA:  There was O2 desaturation as low as 91% with the patient's  obstructive events.   CARDIAC DATA:  No clinically significant arrhythmias were seen.   MOVEMENT-PARASOMNIA:  The patient was found to have 179 leg jerks, but  only 1.4 per hour resulted in arousal or awakening.  There were no  abnormal behaviors noted.   IMPRESSIONS-RECOMMENDATIONS:  1. Small numbers of obstructive events, which do not meet the AHI      criteria of the obstructive sleep apnea syndrome.  2. Large numbers of leg jerks with only 1.4 per hour resulting in      arousal or awakening.  It is unclear whether this      is representative of chronic pain, or whether the patient may have      a primary movement disorder of sleep.  Clinical correlation is      suggested.      Barbaraann Share, MD,FCCP  Diplomate, American Board of Sleep  Medicine  Electronically  Signed     K[facility]/MEDQ  D:  07/26/2008 20:05:42  T:  07/27/2008 07:36:54  Job:  [id number]

## 2010-06-14 NOTE — Cardiovascular Report (Signed)
[name] [name], SIA NO.:  [id number]   MEDICAL RECORD NO.:  [id number]          PATIENT TYPE:  INP   LOCATION:  6533                         FACILITY:  [facility]   PHYSICIAN:  Charlies Constable, M.D. [facility] DATE OF BIRTH:  02-Jul-1955   DATE OF PROCEDURE:  05/16/2005  DATE OF DISCHARGE:  05/17/2005                              CARDIAC CATHETERIZATION   CLINICAL HISTORY:  [name] [name] is 55 years old and has no prior history of  known heart disease, although she does have positive risk factors. She was  seen by Dr. Jeannetta Nap with chest pain and referred to Dr. Antoine Poche. Dr.  Antoine Poche saw her today and admitted her with a diagnosis of unstable angina.  Catheterization was recommended.   PROCEDURE:  The procedure was performed by the right femoral artery using  arterial sheath and 6-French preformed coronary catheters. The following  arterial punctures were performed and Omnipaque contrast was used. After  completion of the diagnostic study, we made a decision to proceed with  intervention on the proximal and near ostial lesion in the circumflex  artery. The patient was given Angiomax bolus and infusion. She was given 600  mg of Plavix at the end of the procedure. We used a CLS 3.5 6-French guiding  catheter with sides holes. We crossed the lesion with a PT2 light support  wire without much difficulty. We predilated with a 2.25 x 12 mm Maverick  performing two inflations up to 8 atmospheres for 30 seconds. We then  deployed a 2.5 x 16 mm Taxus stent. We positioned the proximal edge of the  stent right near the ostium of the circumflex artery. We deployed this with  one inflation at 14 atmospheres for 30 seconds. We then postdilated with a  2.75 x 8 mm Quantum Maverick performing two inflations up to 16 atmospheres  for 30 seconds. Final diagnostic study was then performed through the  guiding catheter.   The right femoral artery was closed with AngioSeal at the end of the  procedure. The patient tolerated the procedure well and left the laboratory  in satisfactory condition.   RESULTS:  The left main coronary artery:  The left main coronary artery was  free of significant disease.   The left anterior descending artery:  The left anterior descending artery  gave rise to two large and three small diagonal branches and three septal  perforators. The LAD was irregular but there was no significant obstruction.   The circumflex artery:  The circumflex artery gave rise to a small ramus  branch and atrial branch, a small and moderate-sized marginal branch and two  posterolateral branches. All the vessels were somewhat small in caliber.  There was a 95% stenosis in the proximal circumflex artery with segmental  disease extending to near the ostium.   The right coronary artery:  The right coronary artery was a moderately large  vessel. It gave rise to a conus branch, two right ventricular branches,  posterior descending branch and two posterolateral branches. There was 30%  proximal and 30% mid-stenosis. There were irregularities in the proximal and  mid-vessel.   LEFT VENTRICULOGRAM:  Performed in the RAO projection showed good wall  motion with no areas of hypokinesis. The estimated ejection fraction was  60%.   The aortic pressure was 107/56 with a mean of 79 and the left ventricular  pressure was 107/5.   Following stenting of the lesion in the proximal circumflex artery, the  stenosis improved from 95% to 0%.   CONCLUSION:  1.  Coronary artery disease with mild irregularities in the left anterior      descending artery, 95% stenosis in the proximal circumflex artery, 30%      proximal and 30% mid-stenosis in the right coronary artery and normal      left ventricular function.  2.  Successful percutaneous coronary intervention of the lesion in the      proximal/ostial circumflex artery using a Taxus drug-eluting stent with      improvement in percent  of narrowing from 95% to 0%.   DISPOSITION:  The patient returned to the postanesthesia room for further  observation. Recommend Plavix long-term with her drug-eluting stent.           ______________________________  Charlies Constable, M.D. [facility]     BB/MEDQ  D:  05/16/2005  T:  05/19/2005  Job:  [id number]   cc:   Windle Guard, M.D.  Fax: [phone]   Rollene Rotunda, M.D.  [address]  [address]  [place]  [place] [address]   Cardiopulmonary Lab

## 2010-06-14 NOTE — Discharge Summary (Signed)
[name], [name] NO.:  [id number]   MEDICAL RECORD NO.:  [id number]          PATIENT TYPE:  INP   LOCATION:  6533                         FACILITY:  [facility]   PHYSICIAN:  Vida Roller, M.D.   DATE OF BIRTH:  13-Apr-1955   DATE OF ADMISSION:  05/16/2005  DATE OF DISCHARGE:  05/17/2005                                 DISCHARGE SUMMARY   PROCEDURES:  1.  Cardiac catheterization.  2.  Coronary arteriogram.  3.  Left ventriculogram.  4.  PTCA and Taxus stent to one vessel.   TIME OF DISCHARGE:  38 minutes.   PRIMARY DIAGNOSIS:  Unstable anginal pain.   SECONDARY DIAGNOSES:  1.  Family history of coronary artery disease.  2.  History of migraines.  3.  Possible history of transient ischemic attacks.  4.  Gastroesophageal reflux disease symptoms.  5.  Allergy or intolerance to IODINE.  6.  Status post cholecystectomy, appendectomy, and hysterectomy.  7.  Possible history of mitral valve prolapse.   HOSPITAL COURSE:  [name] [name] is a 55 year old female with no previous  history of coronary artery disease. She has a possible new diagnosis of  fibromyalgia with intermittent substernal chest heaviness. She was evaluated  by cardiology and admitted for further evaluation and treatment.   It was felt with her multiple cardiac risk factors, she needed cardiac  catheterization to further define her anatomy. This was performed on May 16, 2005.   The cardiac catheterization showed a 95% circumflex which was treated with  PTCA and a Taxus stent reducing the stenosis to zero. She had a 30% lesion  in her RCA and no significant disease for LAD. Her EF was approximately 60%.   As part of her evaluation, a lipid profile was performed which showed total  cholesterol 177, triglycerides 133, HDL 54, LDL 96. She had Zocor 40 added  to her medication regimen for hyperlipidemia. Additionally, she had a TSH  performed which was low at 0.056. She is to follow up with Dr.  Jeannetta Nap for  this. Her potassium was low at 2.8 but this is supplemented and a recheck is  pending at the time of dictation. She was otherwise stable and if her  recheck potassium is within normal limits, she will be discharged on May 17, 2005 with outpatient follow-up arranged.   DISCHARGE INSTRUCTIONS:  1.  Her activity level is to be increased slowly. She is not to drive for 2      days and not to lift anything for 2 weeks.  2.  She is to call our office for any problems with the cath site.  3.  She is to follow up with the physician extender for Dr. Tenny Craw on May 7 at      9:30 a.m. She is also she to follow up with Dr. Jeannetta Nap and call for an      appointment.   DISCHARGE MEDICATIONS:  1.  Plavix 75 mg a day.  2.  Nitroglycerin sublingual p.r.n.  3.  Zocor 40 mg q.h.s.  4.  Klor-Con 20 mEq a day.  5.  Protonix 40 mg b.i.d.  6.  Topamax 100 mg b.i.d.  7.  Detrol LA 4 mg a day.  8.  Omega 3 fish oil 1000 mg a day.  9.  Calcium 500 mg b.i.d.  10. HCTZ 25 mg a day.  11. Aspirin 325 mg a day.  12. Colace daily.  13. Nitrofurantoin 50 mg daily.      Theodore Demark, P.A. [facility]      Vida Roller, M.D.  Electronically Signed    RB/MEDQ  D:  05/17/2005  T:  05/19/2005  Job:  [id number]   cc:   Windle Guard, M.D.  Fax: [phone]

## 2010-06-14 NOTE — H&P (Signed)
NAMETYMIA, STREB NO.:  [id number]   MEDICAL RECORD NO.:  [id number]          PATIENT TYPE:  EMS   LOCATION:  MAJO                         FACILITY:  [facility]   PHYSICIAN:  Tereso Newcomer, P.A.     DATE OF BIRTH:  [birth year]   DATE OF ADMISSION:  05/16/2005  DATE OF DISCHARGE:                                HISTORY & PHYSICAL   CHIEF COMPLAINT:  Chest pain.   HISTORY OF PRESENT ILLNESS:  [name] [name] is a very pleasant 55 year old  female patient with no known history of coronary artery disease who has a  past history of questionable mitral valve prolapse with an echocardiogram in  2003 that showed no mitral valve prolapse and good LV function as well as an  exercise Cardiolite in 2004 that showed no ischemia and EF of 74% who  presents to the office today with complaints of chest pain.  Her chest pain  has been present for the last two weeks.  It is intermittent.  It comes  sometimes with over exertion.  She also notes it at rest.  It will awaken  her from sleep.  It is a substernal heaviness that radiates down her left  arm with associated numbness as well as up into her left jaw.  She notes  associated shortness of breath, nausea, diarrhea and presyncope with it.  She also notes palpitations with it.  She denies any syncope.  Her last  episode of chest pain was last night.  It awoke her and lasted about 20-30  minutes.   PAST MEDICAL HISTORY:  1.  As noted above.  It is significant for nonischemic Cardiolite in 2004      and good LV function and no mitral valve prolapse by echocardiogram in      2003.  2.  She denies any history of hypertension, diabetes mellitus,      hypercholesterolemia or thyroid disease.  3.  She had a history of migraine headaches. '  4.  History of gastroesophageal reflux disease.  5.  She has history of questionable TIA's that never seems to have been      worked up.  6.  She is status post back surgery in 1997.  7.   Cholecystectomy in 1989.  8.  Hospitalization for cat bit in 1989.  9.  Appendectomy in 1981.  10. Partial hysterectomy in 1983.  11. Ovarian cyst surgery in 1981 and 1983.   CURRENT MEDICATIONS:  1.  Klor-Con M-10 two tablets b.i.d.  2.  Protonix 40 mg b.i.d.  3.  Topamax 100 mg b.i.d.  4.  Detrol LA 4 mg daily.  5.  Omega-3 fish oil 1 g b.i.d.  6.  Calcium 500 mg b.i.d.  7.  Premarin 0.625 mg daily.  8.  HCTZ 25 mg daily.  9.  Aspirin 325 mg daily.  10. Colace.  11. Nitrofurantoin 50 mg daily.  12. InnoPran XL 120 mg daily.  13. Patanol drops b.i.d.  14. Nasonex daily.  15. Astelin two sprays b.i.d.  16. Axert p.r.n.   ALLERGIES:  QUESTIONABLE HISTORY OF IODINE  ALLERGY.  Otherwise, no known  drug allergies.   SOCIAL HISTORY:  She denies any tobacco or alcohol abuse.  She smoked  cigarettes briefly when she was a teenager.  She is widowed and has one son.  She cares for her elderly mother.  She is unemployed at this time.  She used  to be a [employer] operational officer.   FAMILY HISTORY:  Significant for coronary disease.  Maternal grandmother and  grandfather both had MI's in their 2's.  Her mother is alive and had CABG  in her 70's.  She has a brother who has heart disease that began in his  110's, but she is unsure of what kind.   REVIEW OF SYSTEMS:  Please see HPI.  She denies any fever, chills, cough,  headaches, dysphagia, odynophagia, melena, hematochezia, hematuria, dysuria.  She does have some leg pain when she walks, and this is bilaterally.  Denies  any symptoms consistent with __________TIA's.  Rest of review of systems are  negative.   PHYSICAL EXAMINATION:  GENERAL APPEARANCE:  She is a well-developed, well-  nourished female in no acute distress.  VITAL SIGNS:  Blood pressure 110/66, pulse 56, weight 203 pounds.  HEENT:  Normocephalic, atraumatic.  Eyes:  PERRLA.  EOMI.  Sclerae are  clear.  NECK:  Without lymphadenopathy.  ENDOCRINE:  No thyromegaly.  Carotids without  bruits bilaterally.  CARDIAC:  Normal S1, S2.  Regular rate and rhythm without murmurs, clicks,  rubs or gallops.  LUNGS:  Clear to auscultation.  ABDOMEN:  Nontender without hepatomegaly.  Normoactive bowel sounds.  EXTREMITIES:  Without edema.  NEUROLOGICAL:  Nonfocal.  SKIN:  Warm and dry.  VASCULAR:  With 2+ femoral artery pulses bilaterally without bruits.   STUDIES:  Electrocardiogram revealed sinus bradycardia with a heart rate of  56, normal axis.  No ischemic changes.   IMPRESSION:  1.  Unstable angina pectoris.  2.  Family history of coronary artery disease.  3.  Questionable history of mitral valve prolapse.  Echocardiogram 2003      without mitral valve prolapse and good left ventricular function.  4.  Non-ischemic Cardiolite 2004.  5.  Migraine headaches.  6.  Questionable history of TIA's.  7.  Gastroesophageal reflux disease.  8.  Reported history of allergy to iodine.   PLAN:  The patient was also interviewed and examined by Dr. Antoine Poche today.  We plan to admit her to [facility] for further evaluation.  She can  be added on to the catheterization schedule today.  We will treat her with  prophylaxis for her questionable history of iodine allergy.      Tereso Newcomer, P.A.     SW/MEDQ  D:  05/16/2005  T:  05/16/2005  Job:  [id number]   cc:   Windle Guard, M.D.  Fax: [phone]

## 2010-06-14 NOTE — Assessment & Plan Note (Signed)
[name]                              CARDIOLOGY OFFICE NOTE   [name] [name], [name] [name]                        MRN:          [id number]  DATE:09/11/2005                            DOB:          [birth year]    PRIMARY CARE PHYSICIAN:  Windle Guard, M.D.   REASON FOR PRESENTATION:  Patient with coronary disease.   HISTORY OF PRESENT ILLNESS:  The patient returns for follow-up.  She has  been doing cardiac rehab.  She gets some atypical fleeting chest discomfort.  She has some arm discomfort that she relates to her rotator cuff.  It is not  the same sensation that prompted her evaluation.  She denies any substernal  chest pressure, neck discomfort, or arm discomfort.  She has had no  palpitation, presyncope, or syncope.  Of note she fell trying to get on the  treadmill at rehab and has a bruise under her ribs.  She has had a little  trouble taking a deep breath with this.   PAST MEDICAL HISTORY:  1. Coronary artery disease (95% circumflex disease treated a Taxus stent,      LAD with mild irregularities, right coronary artery 30% stenosed,      ejection fraction was normal).  2. Migraines.  3. Dyslipidemia.  4. Gastroesophageal reflux disease.  5. Back surgery.  6. Cholecystectomy.  7. Appendectomy.  8. Partial hysterectomy.  9. Ovarian cyst resection.   ALLERGIES:  None.   MEDICATIONS:  1. Protonix 40 mg b.i.d.  2. Topamax.  3. Detrol LA.  4. Omega-3 fatty acids.  5. Calcium.  6. Aspirin 325 mg a day.  7. Colace.  8. Nitrofurantoin.  9. Propranolol 120 mg daily.  10.Nasonex.  11.Astelin.  12.Klor-Con 20 mEq b.i.d.  13.Maxzide 75/50.  14.Zocor 40 mg daily.  15.Plavix 75 mg daily.   REVIEW OF SYSTEMS:  As stated in the HPI, otherwise negative for other  systems.   PHYSICAL EXAMINATION:  GENERAL:  The patient is in no distress.  VITAL SIGNS:  Blood pressure 105/69, heart rate 69 and regular, weight 186  pounds.  HEENT:  Eyes  unremarkable.  Pupils equal, round, and reactive to light.  Fundi not visualized.  Oral mucosa unremarkable.  NECK:  No jugular venous distention.  Wave form within normal limits.  Carotid upstroke brisk, symmetric, no bruits.  No thyromegaly.  LYMPHATICS:  No cervical, axillary, inguinal adenopathy.  LUNGS:  Clear to auscultation bilaterally.  BACK:  No costovertebral angle tenderness.  CHEST:  Unremarkable.  HEART:  PMI not displaced or sustained.  S1 and S2 within normal limits, no  S3, no S4, no murmurs.  ABDOMEN:  Flat, positive bowel sounds.  Normal frequency and pitch.  No  bruits, rebound.  No guarding.  No organomegaly.  SKIN:  No rashes, nodules, large ecchymosis on the lower right rib cage,  tender to palpation.  NEUROLOGIC:  Grossly intact.   ASSESSMENT/PLAN:  1. Coronary disease.  The patient is having no ongoing symptoms consistent      with previous angina.  No further cardiovascular testing is suggested.  Should continue with aggressive risk reduction.  2. Dyslipidemia.  She has been given written instructions to get a lipid      profile and liver enzymes done.  3. Follow-up.  I will see her back in one year, sooner if needed.                               Rollene Rotunda, MD, [name]    JH/MedQ  DD:  09/11/2005  DT:  09/12/2005  Job #:  [id number]   cc:   Windle Guard, MD

## 2010-08-19 ENCOUNTER — Other Ambulatory Visit: Payer: Self-pay | Admitting: Cardiology

## 2010-09-04 ENCOUNTER — Encounter: Payer: Self-pay | Admitting: Family Medicine

## 2010-09-05 ENCOUNTER — Ambulatory Visit (INDEPENDENT_AMBULATORY_CARE_PROVIDER_SITE_OTHER): Payer: Medicare Other | Admitting: Family Medicine

## 2010-09-05 ENCOUNTER — Encounter: Payer: Self-pay | Admitting: Family Medicine

## 2010-09-05 VITALS — BP 100/70 | HR 65 | Temp 97.6°F | Ht 68.0 in | Wt 223.8 lb

## 2010-09-05 DIAGNOSIS — J029 Acute pharyngitis, unspecified: Secondary | ICD-10-CM

## 2010-09-05 DIAGNOSIS — J040 Acute laryngitis: Secondary | ICD-10-CM

## 2010-09-05 DIAGNOSIS — R110 Nausea: Secondary | ICD-10-CM

## 2010-09-05 LAB — POCT RAPID STREP A (OFFICE): Rapid Strep A Screen: NEGATIVE

## 2010-09-05 MED ORDER — AZITHROMYCIN 250 MG PO TABS: ORAL_TABLET | ORAL | Status: AC

## 2010-09-05 NOTE — Progress Notes (Signed)
  Subjective:    Patient ID: [name] [name], female    DOB: 05/25/1955, 55 y.o.   MRN: [id number]  HPI  55 year old who feels ill. Feeling some nausea. Also with some laryngitis. Has been intermittently sick for a couple of months. Altered voice and laryngitis for 2-3 months now.   Has been having some headache and nausea and laryngitis. Neck is sore in her neck. Has been progressively getting worse over the last couple of weeks. Feels like a sore throat. Before then was having some neck pain.   Also will intermittently have nausea. No new meds. Has been on all for a long time.   Takes Keflex daily for chronic UTI  The PMH, PSH, Social History, Family History, Medications, and allergies have been reviewed in [facility], and have been updated if relevant.  Review of Systems ROS: GEN: Acute illness details above GI: Tolerating PO intake GU: maintaining adequate hydration and urination Pulm: No SOB Interactive and getting along well at home.  Otherwise, ROS is as per the HPI.     Objective:   Physical Exam   Physical Exam  Blood pressure 100/70, pulse 65, temperature 97.6 F (36.4 C), temperature source Oral, height 5\' 8"  (1.727 m), weight 223 lb 12.8 oz (101.515 kg), SpO2 99.00%.  GEN: WDWN, NAD, Non-toxic, A & O x 3 HEENT: Atraumatic, Normocephalic. Neck supple. No masses, No LAD. Ears and Nose: No external deformity. TM clear. S/p tonsillectomy CV: RRR, No M/G/R. No JVD. No thrill. No extra heart sounds. PULM: CTA B, no wheezes, crackles, rhonchi. No retractions. No resp. distress. No accessory muscle use. ABD: S, NT, ND, +BS. No rebound tenderness. No HSM.  EXTR: No c/c/e NEURO Normal gait.  PSYCH: Normally interactive. Conversant. Not depressed or anxious appearing.  Calm demeanor.        Assessment & Plan:   1. Sore throat  POCT rapid strep A, azithromycin (ZITHROMAX) 250 MG tablet  2. Laryngitis    3. Nausea     Exact etiology unclear Strep neg  Length of  infection 2 months. Possible it could be mycoplasma of the pharynx - will treat with azithro  If not improved in 10-14 days, would consult ENT to rule out other laryngeal pathology

## 2010-09-30 ENCOUNTER — Telehealth: Payer: Self-pay | Admitting: Family Medicine

## 2010-09-30 DIAGNOSIS — N259 Disorder resulting from impaired renal tubular function, unspecified: Secondary | ICD-10-CM

## 2010-09-30 DIAGNOSIS — E559 Vitamin D deficiency, unspecified: Secondary | ICD-10-CM

## 2010-09-30 DIAGNOSIS — R946 Abnormal results of thyroid function studies: Secondary | ICD-10-CM

## 2010-09-30 DIAGNOSIS — R7309 Other abnormal glucose: Secondary | ICD-10-CM

## 2010-09-30 DIAGNOSIS — Z0000 Encounter for general adult medical examination without abnormal findings: Secondary | ICD-10-CM

## 2010-09-30 DIAGNOSIS — E785 Hyperlipidemia, unspecified: Secondary | ICD-10-CM

## 2010-09-30 NOTE — Telephone Encounter (Signed)
Message copied by Judy Pimple on Mon Sep 30, 2010  6:12 PM ------      Message from: Alvina Chou      Created: Thu Sep 26, 2010  4:42 PM       Labs for Tuesday, thanks, t

## 2010-10-01 ENCOUNTER — Other Ambulatory Visit (INDEPENDENT_AMBULATORY_CARE_PROVIDER_SITE_OTHER): Payer: Medicare Other

## 2010-10-01 DIAGNOSIS — E785 Hyperlipidemia, unspecified: Secondary | ICD-10-CM

## 2010-10-01 DIAGNOSIS — E559 Vitamin D deficiency, unspecified: Secondary | ICD-10-CM

## 2010-10-01 DIAGNOSIS — R946 Abnormal results of thyroid function studies: Secondary | ICD-10-CM

## 2010-10-01 DIAGNOSIS — Z0000 Encounter for general adult medical examination without abnormal findings: Secondary | ICD-10-CM

## 2010-10-01 DIAGNOSIS — R7309 Other abnormal glucose: Secondary | ICD-10-CM

## 2010-10-01 LAB — CBC WITH DIFFERENTIAL/PLATELET
Eosinophils Relative: 2.3 % (ref 0.0–5.0)
Lymphocytes Relative: 32.9 % (ref 12.0–46.0)
Monocytes Relative: 7.5 % (ref 3.0–12.0)
Neutrophils Relative %: 56.9 % (ref 43.0–77.0)
Platelets: 190 10*3/uL (ref 150.0–400.0)
WBC: 6.2 10*3/uL (ref 4.5–10.5)

## 2010-10-01 LAB — COMPREHENSIVE METABOLIC PANEL
Albumin: 4.1 g/dL (ref 3.5–5.2)
Alkaline Phosphatase: 56 U/L (ref 39–117)
CO2: 23 mEq/L (ref 19–32)
Chloride: 107 mEq/L (ref 96–112)
GFR: 50.32 mL/min — ABNORMAL LOW (ref >60.00)
Glucose, Bld: 181 mg/dL — ABNORMAL HIGH (ref 70–99)
Potassium: 3.7 mEq/L (ref 3.5–5.1)
Sodium: 139 mEq/L (ref 135–145)
Total Protein: 7.4 g/dL (ref 6.0–8.3)

## 2010-10-01 LAB — TSH: TSH: 2 u[IU]/mL (ref 0.35–5.50)

## 2010-10-01 LAB — LIPID PANEL
HDL: 38.9 mg/dL — ABNORMAL LOW (ref >39.00)
Total CHOL/HDL Ratio: 3

## 2010-10-01 LAB — HEMOGLOBIN A1C: Hgb A1c MFr Bld: 5.7 % (ref 4.6–6.5)

## 2010-10-04 ENCOUNTER — Ambulatory Visit: Payer: Medicare Other | Admitting: Family Medicine

## 2010-10-08 ENCOUNTER — Encounter: Payer: Self-pay | Admitting: Family Medicine

## 2010-10-08 ENCOUNTER — Ambulatory Visit (INDEPENDENT_AMBULATORY_CARE_PROVIDER_SITE_OTHER): Payer: Medicare Other | Admitting: Family Medicine

## 2010-10-08 DIAGNOSIS — E559 Vitamin D deficiency, unspecified: Secondary | ICD-10-CM

## 2010-10-08 DIAGNOSIS — E785 Hyperlipidemia, unspecified: Secondary | ICD-10-CM

## 2010-10-08 DIAGNOSIS — G43009 Migraine without aura, not intractable, without status migrainosus: Secondary | ICD-10-CM

## 2010-10-08 DIAGNOSIS — N259 Disorder resulting from impaired renal tubular function, unspecified: Secondary | ICD-10-CM

## 2010-10-08 DIAGNOSIS — K219 Gastro-esophageal reflux disease without esophagitis: Secondary | ICD-10-CM

## 2010-10-08 NOTE — Assessment & Plan Note (Signed)
Fairly stable on 40 of zocor  Rev lab with pt Rev low sat fat diet - sounds like she does well with this

## 2010-10-08 NOTE — Assessment & Plan Note (Signed)
Much worse lately and unable to take ppi in light of plavix Did bump her zantac up to 300 bid  Will try to find out from cardiologist whether trial of protonix would be safe or not Currently she is having ST, hoarseness/ heartburn and symptoms of gastritis and quite miserable  Will update her soon  Will consider GI ref if more cannot be done

## 2010-10-08 NOTE — Assessment & Plan Note (Signed)
Has had a very hard time with migraines On small dose of topamax and seeing neurology  ? If this could worsen acid reflux Watching kidney function

## 2010-10-08 NOTE — Assessment & Plan Note (Signed)
D level is low tx on current therapy Will continue to monitor

## 2010-10-08 NOTE — Assessment & Plan Note (Signed)
Overall stable on low dose topamax Increase water intake  Continue to monitor

## 2010-10-08 NOTE — Progress Notes (Signed)
Subjective:    Patient ID: [name] [name], female    DOB: [birth year], 55 y.o.   MRN: [id number]  HPI Here for f/u of renal insufficiency and hyperglycemia and lipids and vit D def  Overall had a miserable summer - just did not feel good  Head hurts and stomach and her throat , now it is starting as congestion in her chest Sees Dr Anne Hahn for migraines - is back on topamax - but only working a little  Sees Dr Talmage Nap for endo -- cannot get in until December  Was here for a sore throat - and given antibiotic , had lost her voice  Still sees her counselor Dr Farrel Demark   The sore throat and headache are the worse  Stomach hurts and a lot of nausea without vomiting since June  Takes zantac 300 mg  Does wonder about acid reflux -- had to go off prilosec/nexium due to plavix  Sees Dr Antoine Poche   Does have dry skin  Thinks she is gaining weight again      Wt is stable with bmi of 34  Bun 27 and cr 1.2 - overall stable  Also checked by neurology    Glucose fasting was high at 181 but the a1c down at 5.7 Diet- is very low sugar - she works hard at that   Alt slt up at 37  Vit D 33  Ca and D  Still on topamax - starting to help a little   On armour thyroid -- seeing Dr Talmage Nap    zocor for lipids  Lab Results  Component Value Date   CHOL 115 10/01/2010   CHOL 131 03/28/2010   CHOL 119 09/22/2008   Lab Results  Component Value Date   HDL 38.90* 10/01/2010   HDL 40.98* 03/28/2010   HDL 11.91* 09/22/2008   Lab Results  Component Value Date   LDLCALC 41 10/01/2010   LDLCALC 58 03/28/2010   LDLCALC 57 09/22/2008   Lab Results  Component Value Date   TRIG 174.0* 10/01/2010   TRIG 178.0* 03/28/2010   TRIG 124.0 09/22/2008   Lab Results  Component Value Date   CHOLHDL 3 10/01/2010   CHOLHDL 3 03/28/2010   CHOLHDL 3 09/22/2008   No results found for this basename: LDLDIRECT   overall fairly stable   Patient Active Problem List  Diagnoses  . UNSPECIFIED VITAMIN D DEFICIENCY  .  HYPERLIPIDEMIA  . OBESITY  . DEPRESSIVE DISORDER  . PERIODIC LIMB MOVEMENT DISORDER  . COMMON MIGRAINE  . C A D  . GERD  . RENAL INSUFFICIENCY  . UTI'S, CHRONIC  . DEGENERATIVE DISC DISEASE, LUMBAR SPINE  . SOMNOLENCE  . HYPERSOMNIA  . EDEMA  . Other Abnormal Glucose  . OTHER ABNORMAL BLOOD CHEMISTRY  . ABNORMAL THYROID FUNCTION TESTS  . HEMATURIA, MICROSCOPIC, HX OF  . Routine general medical examination at a health care facility   Past Medical History  Diagnosis Date  . Depression   . Anorexia   . Migraine   . GERD (gastroesophageal reflux disease)   . Seasonal allergies   . Arrhythmia   . CAD (coronary artery disease)   . Frequent UTI   . Edema   . Vitamin D deficiency   . Restless leg syndrome   . Microscopic hematuria    Past Surgical History  Procedure Date  . Cholecystectomy open   . Appendectomy   . Abdominal hysterectomy   . Back surgery 1997    herniated disc repair  .  Cystoscopy     neg   History  Substance Use Topics  . Smoking status: Former Games developer  . Smokeless tobacco: Not on file  . Alcohol Use: Yes   Family History  Problem Relation Age of Onset  . Coronary artery disease Mother   . Thyroid disease Mother   . Alzheimer's disease Mother   . Diabetes Mother   . Heart disease Mother   . Cancer Brother     melnoma  . Cancer Paternal Aunt     ovarian  . Cancer Paternal Uncle     bone  . Cancer Maternal Grandmother     pancreatic  . Heart disease Maternal Grandfather    Allergies  Allergen Reactions  . Iohexol      Desc: throat selling resp distress hives.premedicate pt.entered 07/06/04, bsw.    Current Outpatient Prescriptions on File Prior to Visit  Medication Sig Dispense Refill  . aspirin 325 MG tablet Take 325 mg by mouth daily.        . Calcium 1200-1000 MG-UNIT CHEW Chew 1 tablet by mouth 2 (two) times daily.        . cephALEXin (KEFLEX) 250 MG capsule Take 250 mg by mouth daily.        . ciclesonide (OMNARIS) 50 MCG/ACT  nasal spray Place 2 sprays into both nostrils daily.        . clopidogrel (PLAVIX) 75 MG tablet TAKE 1 TABLET DAILY  90 tablet  3  . Coenzyme Q10 (CO Q-10) 200 MG CAPS Take 1 capsule by mouth daily.        . fish oil-omega-3 fatty acids 1000 MG capsule Take 1 g by mouth 2 (two) times daily.        . furosemide (LASIX) 20 MG tablet Take 20 mg by mouth daily.        . Ginkgo Biloba 120 MG CAPS Take 1 capsule by mouth daily.        . IPRATROPIUM BROMIDE NA Place 2 sprays into the nose daily.        . Multiple Vitamin (MULTIVITAMIN) tablet Take 1 tablet by mouth daily.        Marland Kitchen olopatadine (PATANOL) 0.1 % ophthalmic solution Place 1 drop into both eyes 2 (two) times daily.        . potassium chloride (KLOR-CON) 10 MEQ CR tablet Take 10 mEq by mouth daily.        . potassium chloride SA (K-DUR,KLOR-CON) 20 MEQ tablet Take 40 mEq by mouth 2 (two) times daily.       . propranolol (INNOPRAN XL) 120 MG 24 hr capsule Take 120 mg by mouth at bedtime.        . ranitidine (ZANTAC) 300 MG tablet Take 300 mg by mouth 2 (two) times daily.       . simvastatin (ZOCOR) 40 MG tablet TAKE 1 TABLET AT BEDTIME  90 tablet  3  . thyroid (ARMOUR) 30 MG tablet Take 90 mg by mouth daily.       Marland Kitchen topiramate (TOPAMAX) 25 MG tablet Take 100 mg by mouth at bedtime.       Marland Kitchen azelastine (ASTELIN) 137 MCG/SPRAY nasal spray Place 1 spray into the nose 2 (two) times daily. Use in each nostril as directed       . nitroGLYCERIN (NITROSTAT) 0.4 MG SL tablet Place 0.4 mg under the tongue every 5 (five) minutes as needed.           Review of Systems Review  of Systems  Constitutional: Negative for fever, appetite change, fatigue and unexpected weight change.  Eyes: Negative for pain and visual disturbance.  Respiratory: Negative for cough and shortness of breath.   Cardiovascular: Negative for cp or palpitations    Gastrointestinal: Negative for, diarrhea and constipation. pos for epigastric pain , heartburn, sore throat and  hoarseness  Genitourinary: Negative for urgency and frequency.  Skin: Negative for pallor or rash   Neurological: Negative for weakness, light-headedness, numbness and headaches.  Hematological: Negative for adenopathy. Does not bruise/bleed easily.  Psychiatric/Behavioral:pos for depression and anxiety - no suicidal thoughts         Objective:   Physical Exam  Constitutional: She appears well-developed and well-nourished. No distress.       Obese and fatigued appearing  Emotionally distressed   HENT:  Head: Normocephalic and atraumatic.  Mouth/Throat: Oropharynx is clear and moist.  Eyes: Conjunctivae and EOM are normal. Pupils are equal, round, and reactive to light. No scleral icterus.  Neck: Normal range of motion. Neck supple. No JVD present. Carotid bruit is not present. No thyromegaly present.  Cardiovascular: Normal rate, regular rhythm, normal heart sounds and intact distal pulses.   Pulmonary/Chest: Effort normal and breath sounds normal. No respiratory distress. She has no wheezes. She exhibits no tenderness.  Abdominal: Soft. Bowel sounds are normal. She exhibits no distension, no abdominal bruit and no mass. There is no tenderness.  Musculoskeletal: Normal range of motion. She exhibits no edema and no tenderness.  Lymphadenopathy:    She has no cervical adenopathy.  Neurological: She is alert. She has normal reflexes. No cranial nerve deficit. Coordination normal.  Skin: Skin is warm and dry. No rash noted. No erythema. No pallor.  Psychiatric:       Anxious and depressed Tearful at times Talks much about her physical symptoms and misery from that           Assessment & Plan:

## 2010-10-08 NOTE — Patient Instructions (Addendum)
I will try to contact your cardiologist about safe options for acid reflux problems  In the meantime increase your zantac to 300 mg twice daily - am and pm  Stay away from acidic foods and beverages Keep up a good water intake  Continue follow up with neurology  Kidney function and cholesterol are fairly stable  If you do not hear from me in 1 week please call

## 2010-10-11 ENCOUNTER — Telehealth: Payer: Self-pay | Admitting: Family Medicine

## 2010-10-11 MED ORDER — PANTOPRAZOLE SODIUM 40 MG PO TBEC: 40.0000 mg | DELAYED_RELEASE_TABLET | Freq: Every day | ORAL | Status: DC

## 2010-10-11 NOTE — Telephone Encounter (Signed)
LMOVM to return call.

## 2010-10-11 NOTE — Telephone Encounter (Signed)
Please let pt know I corresponded with her cardiologist- is ok to start the protonix for her reflux  She can come off the zantac gradually Px written for call in  (? Which pharmacy she will want it to go to ) F/u with me in about 2 weeks - there is a test we may be able to do called P2Y12 test that lets [place] know if plavix is still working  Please schedule that f/u -thanks

## 2010-10-14 NOTE — Telephone Encounter (Signed)
Pt states she doesn't think reflux is the problem.  She says she has been very ill since last Thursday, after doubling up on the zantac.  She has been having nausea, a lot of coughing and headaches and has lost most of her voice.  Low grade fever, if any.

## 2010-10-14 NOTE — Telephone Encounter (Signed)
Left vm for pt to callback 

## 2010-10-14 NOTE — Telephone Encounter (Signed)
Then it sounds like she has 2 problems  The issues we talked about last time had gone on a long time - which is why I thought it was reflux  For that - I would at least like her to try the protonix instead of the zantac (she agreed with me at the time) Now perhaps she has a cold/ uri on top of it ? Given the low grade fever/ etc  I recommend trying the protonix and f/u for the newer symptoms when she can if not improving  If she does not want to follow this path let me know -- she was just very miserable at our last visit  Thanks

## 2010-10-15 NOTE — Telephone Encounter (Signed)
Patient notified as instructed by telephone. Pt scheduled appt with Dr Milinda Antis to f/u on Plavix 10/28/10 at 2pm and made appt with Dr Patsy Lager 10/16/10 at 11:15am for nausea, cough and h/a. Medication phoned to  [facility] [address] pharmacy as instructed.

## 2010-10-16 ENCOUNTER — Ambulatory Visit (INDEPENDENT_AMBULATORY_CARE_PROVIDER_SITE_OTHER): Payer: Medicare Other | Admitting: Family Medicine

## 2010-10-16 ENCOUNTER — Encounter: Payer: Self-pay | Admitting: Family Medicine

## 2010-10-16 VITALS — BP 120/72 | HR 65 | Temp 97.7°F | Ht 68.0 in | Wt 218.1 lb

## 2010-10-16 DIAGNOSIS — R110 Nausea: Secondary | ICD-10-CM

## 2010-10-16 DIAGNOSIS — R52 Pain, unspecified: Secondary | ICD-10-CM

## 2010-10-16 DIAGNOSIS — J370 Chronic laryngitis: Secondary | ICD-10-CM

## 2010-10-16 DIAGNOSIS — K219 Gastro-esophageal reflux disease without esophagitis: Secondary | ICD-10-CM

## 2010-10-16 DIAGNOSIS — R51 Headache: Secondary | ICD-10-CM

## 2010-10-16 NOTE — Patient Instructions (Signed)
REFERRAL: GO THE THE FRONT ROOM AT THE ENTRANCE OF OUR CLINIC, NEAR CHECK IN. ASK FOR MARION. SHE WILL HELP YOU SET UP YOUR REFERRAL. DATE: TIME:  

## 2010-10-16 NOTE — Progress Notes (Signed)
Subjective:    Patient ID: [name] [name], female    DOB: 11-22-1955, 55 y.o.   MRN: [id number]  HPI  ALISHA BACUS, a 55 y.o. female presents today in the office for the following:    55 year old female with continued ongoing HA, ST, and generally not feeling well.   4 months of laryngitis. This is generally been constant. She does think that it probably got better a little bit after taking some azithromycin when I saw her 2 months ago. Other than that it has been constant, and she does not think she has felt well and a long time.  Now with cold, cough, and congestion like symptoms. Congestion, nausea has continued. Some headaches. Sore throat. Feeling miserable.   Overall -- has been feeling terrible.  Got better with azithromycin.    Over the last week, the patient has developed some URI type symptoms, over and above her baseline and generally not feeling well, and has had some runny nose, congestion, cough. No fever.   Lipids:    Component Value Date/Time   CHOL 115 10/01/2010 1200   TRIG 174.0* 10/01/2010 1200   HDL 38.90* 10/01/2010 1200   VLDL 34.8 10/01/2010 1200   CHOLHDL 3 10/01/2010 1200    CBC:    Component Value Date/Time   WBC 6.2 10/01/2010 1200   HGB 13.3 10/01/2010 1200   HCT 39.1 10/01/2010 1200   PLT 190.0 10/01/2010 1200   MCV 90.8 10/01/2010 1200   NEUTROABS 3.5 10/01/2010 1200   LYMPHSABS 2.0 10/01/2010 1200   MONOABS 0.5 10/01/2010 1200   EOSABS 0.1 10/01/2010 1200   BASOSABS 0.0 10/01/2010 1200    Basic Metabolic Panel:    Component Value Date/Time   NA 139 10/01/2010 1200   K 3.7 10/01/2010 1200   CL 107 10/01/2010 1200   CO2 23 10/01/2010 1200   BUN 27* 10/01/2010 1200   CREATININE 1.2 10/01/2010 1200   GLUCOSE 181* 10/01/2010 1200   CALCIUM 9.7 10/01/2010 1200    Lab Results  Component Value Date   ALT 37* 10/01/2010   AST 31 10/01/2010   ALKPHOS 56 10/01/2010   BILITOT 1.4* 10/01/2010   Lab Results  Component Value Date   TSH 2.00 10/01/2010   Patient Active Problem List    Diagnoses  . UNSPECIFIED VITAMIN D DEFICIENCY  . HYPERLIPIDEMIA  . OBESITY  . DEPRESSIVE DISORDER  . PERIODIC LIMB MOVEMENT DISORDER  . COMMON MIGRAINE  . C A D  . GERD  . RENAL INSUFFICIENCY  . UTI'S, CHRONIC  . DEGENERATIVE DISC DISEASE, LUMBAR SPINE  . SOMNOLENCE  . HYPERSOMNIA  . EDEMA  . Other Abnormal Glucose  . OTHER ABNORMAL BLOOD CHEMISTRY  . ABNORMAL THYROID FUNCTION TESTS  . HEMATURIA, MICROSCOPIC, HX OF  . Routine general medical examination at a health care facility   Past Medical History  Diagnosis Date  . Depression   . Anorexia   . Migraine   . GERD (gastroesophageal reflux disease)   . Seasonal allergies   . Arrhythmia   . CAD (coronary artery disease)   . Frequent UTI   . Edema   . Vitamin D deficiency   . Restless leg syndrome   . Microscopic hematuria    Past Surgical History  Procedure Date  . Cholecystectomy open   . Appendectomy   . Abdominal hysterectomy   . Back surgery 1997    herniated disc repair  . Cystoscopy     neg  History  Substance Use Topics  . Smoking status: Former Games developer  . Smokeless tobacco: Not on file  . Alcohol Use: Yes   Family History  Problem Relation Age of Onset  . Coronary artery disease Mother   . Thyroid disease Mother   . Alzheimer's disease Mother   . Diabetes Mother   . Heart disease Mother   . Cancer Brother     melnoma  . Cancer Paternal Aunt     ovarian  . Cancer Paternal Uncle     bone  . Cancer Maternal Grandmother     pancreatic  . Heart disease Maternal Grandfather    Allergies  Allergen Reactions  . Iohexol      Desc: throat selling resp distress hives.premedicate pt.entered 07/06/04, bsw.    Current Outpatient Prescriptions on File Prior to Visit  Medication Sig Dispense Refill  . aspirin 325 MG tablet Take 325 mg by mouth daily.        Marland Kitchen azelastine (ASTELIN) 137 MCG/SPRAY nasal spray Place 1 spray into the nose 2 (two) times daily. Use in each nostril as directed       .  Calcium 1200-1000 MG-UNIT CHEW Chew 1 tablet by mouth 2 (two) times daily.        . cephALEXin (KEFLEX) 250 MG capsule Take 250 mg by mouth daily.        . ciclesonide (OMNARIS) 50 MCG/ACT nasal spray Place 2 sprays into both nostrils daily.        . clopidogrel (PLAVIX) 75 MG tablet TAKE 1 TABLET DAILY  90 tablet  3  . Coenzyme Q10 (CO Q-10) 200 MG CAPS Take 1 capsule by mouth daily.        . fish oil-omega-3 fatty acids 1000 MG capsule Take 1 g by mouth 2 (two) times daily.        . furosemide (LASIX) 20 MG tablet Take 20 mg by mouth daily.        . Ginkgo Biloba 120 MG CAPS Take 1 capsule by mouth daily.        . IPRATROPIUM BROMIDE NA Place 2 sprays into the nose daily.        Marland Kitchen MAXALT-MLT 10 MG disintegrating tablet as needed.       . Multiple Vitamin (MULTIVITAMIN) tablet Take 1 tablet by mouth daily.        . nitroGLYCERIN (NITROSTAT) 0.4 MG SL tablet Place 0.4 mg under the tongue every 5 (five) minutes as needed.        Marland Kitchen olopatadine (PATANOL) 0.1 % ophthalmic solution Place 1 drop into both eyes 2 (two) times daily.        . pantoprazole (PROTONIX) 40 MG tablet Take 1 tablet (40 mg total) by mouth daily.  30 tablet  11  . potassium chloride (KLOR-CON) 10 MEQ CR tablet Take 10 mEq by mouth daily.        . potassium chloride SA (K-DUR,KLOR-CON) 20 MEQ tablet Take 40 mEq by mouth 2 (two) times daily.       . propranolol (INNOPRAN XL) 120 MG 24 hr capsule Take 120 mg by mouth at bedtime.        . ranitidine (ZANTAC) 300 MG tablet Take 300 mg by mouth 2 (two) times daily.       . simvastatin (ZOCOR) 40 MG tablet TAKE 1 TABLET AT BEDTIME  90 tablet  3  . thyroid (ARMOUR) 30 MG tablet Take 90 mg by mouth daily.       Marland Kitchen  topiramate (TOPAMAX) 25 MG tablet Take 100 mg by mouth at bedtime.         Review of Systems As above. No fever. Multiple complaints. Headache. Nausea. Recently changed to Protonix. No chest pain or shortness of breath.    Objective:   Physical Exam   Physical  Exam  Blood pressure 120/72, pulse 65, temperature 97.7 F (36.5 C), temperature source Oral, height 5\' 8"  (1.727 m), weight 218 lb 1.9 oz (98.939 kg), SpO2 97.00%.  Gen: WDWN, NAD; A & O x3, cooperative. Pleasant.Globally Non-toxic HEENT: Normocephalic and atraumatic. Throat clear, w/o exudate, R TM clear, L TM - good landmarks, No fluid present. rhinnorhea.  MMM Frontal sinuses: NT Max sinuses: NT NECK: Anterior cervical  LAD is absent CV: RRR, No M/G/R, cap refill <2 sec PULM: Breathing comfortably in no respiratory distress. no wheezing, crackles, rhonchi EXT: No c/c/e PSYCH: Friendly, good eye contact MSK: Nml gait        Assessment & Plan:   1. Chronic laryngitis  Ambulatory referral to ENT  2. GERD    3. Nausea    4. Aching    5. Headache      Multiple complaints. Her finger the patient has a URI today over the above some of her other issues.  Suspect the nausea may have been impacted from her reflux. Recently changed to Protonix, which I think was a good idea.  This certainly could be causing some chronic laryngitis, but after a 4 month history, think that this needs to be evaluated more definitively to exclude potentially significantly harmful causes.

## 2010-10-28 ENCOUNTER — Other Ambulatory Visit: Payer: Self-pay | Admitting: *Deleted

## 2010-10-28 ENCOUNTER — Other Ambulatory Visit: Payer: Self-pay | Admitting: Family Medicine

## 2010-10-28 ENCOUNTER — Encounter: Payer: Self-pay | Admitting: Family Medicine

## 2010-10-28 ENCOUNTER — Ambulatory Visit (INDEPENDENT_AMBULATORY_CARE_PROVIDER_SITE_OTHER): Payer: Medicare Other | Admitting: Family Medicine

## 2010-10-28 DIAGNOSIS — K219 Gastro-esophageal reflux disease without esophagitis: Secondary | ICD-10-CM

## 2010-10-28 DIAGNOSIS — I2510 Atherosclerotic heart disease of native coronary artery without angina pectoris: Secondary | ICD-10-CM

## 2010-10-28 DIAGNOSIS — T887XXA Unspecified adverse effect of drug or medicament, initial encounter: Secondary | ICD-10-CM

## 2010-10-28 DIAGNOSIS — T50905A Adverse effect of unspecified drugs, medicaments and biological substances, initial encounter: Secondary | ICD-10-CM

## 2010-10-28 LAB — PLATELET INHIBITION P2Y12

## 2010-10-28 NOTE — Assessment & Plan Note (Signed)
Pt on PPI (protonix and plavix)  Needs to inc dose of protonix if poss Check P2Y12 test to see effect and make plan

## 2010-10-28 NOTE — Telephone Encounter (Signed)
Received a faxed refill request from [facility] for Pantoprazole 40 mg with directions.

## 2010-10-28 NOTE — Telephone Encounter (Signed)
I want to hold off on this for a bit until her labs come back - because I may be increasing to bid -unsure yet Please let pt know that I'm going to hold off until labs return on this

## 2010-10-28 NOTE — Patient Instructions (Signed)
Labs today  Will update you with result  Stick with protonix once daily for now and then I will let you know if we can go up Follow up with Dr Chestine Spore as planned I'm glad you are starting to feel better

## 2010-10-28 NOTE — Progress Notes (Signed)
Subjective:    Patient ID: [name] [name], female    DOB: 04/28/1955, 55 y.o.   MRN: [id number]  HPI Here for f/u of gerd Last visit having bad symptoms including throat/ voice involvement that H2 blocker was not handling alone  Could not take PPI due to plavix However- I spoke to her cardiologist and was given the ok to try protonix and consider a test for efficacy of   Is overall some better - symptoms come and go at times  All symptoms are generally improved   Is on protonix for GERD with laryngeopharyngeal effects -- and voice sounds a lot better today Over weekend was a bit worse  Throat at times is better and at times is worse  Also feels a knot there   Sinus infection is being treated - is on abx until the 16th and sees Dr Chestine Spore for CT scan then  One of the worst infx she has ever seen   Through "self hypnosis" has been holding back her pain   Sees counselor regularly   Patient Active Problem List  Diagnoses  . UNSPECIFIED VITAMIN D DEFICIENCY  . HYPERLIPIDEMIA  . OBESITY  . DEPRESSIVE DISORDER  . PERIODIC LIMB MOVEMENT DISORDER  . COMMON MIGRAINE  . C A D  . GERD  . RENAL INSUFFICIENCY  . UTI'S, CHRONIC  . DEGENERATIVE DISC DISEASE, LUMBAR SPINE  . SOMNOLENCE  . HYPERSOMNIA  . EDEMA  . Other Abnormal Glucose  . OTHER ABNORMAL BLOOD CHEMISTRY  . ABNORMAL THYROID FUNCTION TESTS  . HEMATURIA, MICROSCOPIC, HX OF  . Routine general medical examination at a health care facility  . Adverse effects of medication   Past Medical History  Diagnosis Date  . Depression   . Anorexia   . Migraine   . GERD (gastroesophageal reflux disease)   . Seasonal allergies   . Arrhythmia   . CAD (coronary artery disease)   . Frequent UTI   . Edema   . Vitamin D deficiency   . Restless leg syndrome   . Microscopic hematuria    Past Surgical History  Procedure Date  . Cholecystectomy open   . Appendectomy   . Abdominal hysterectomy   . Back surgery 1997    herniated  disc repair  . Cystoscopy     neg   History  Substance Use Topics  . Smoking status: Former Games developer  . Smokeless tobacco: Not on file  . Alcohol Use: Yes   Family History  Problem Relation Age of Onset  . Coronary artery disease Mother   . Thyroid disease Mother   . Alzheimer's disease Mother   . Diabetes Mother   . Heart disease Mother   . Cancer Brother     melnoma  . Cancer Paternal Aunt     ovarian  . Cancer Paternal Uncle     bone  . Cancer Maternal Grandmother     pancreatic  . Heart disease Maternal Grandfather    Allergies  Allergen Reactions  . Iohexol      Desc: throat selling resp distress hives.premedicate pt.entered 07/06/04, bsw.    Current Outpatient Prescriptions on File Prior to Visit  Medication Sig Dispense Refill  . aspirin 325 MG tablet Take 325 mg by mouth daily.        . Calcium 1200-1000 MG-UNIT CHEW Chew 1 tablet by mouth 2 (two) times daily.        . cephALEXin (KEFLEX) 250 MG capsule Take 250 mg by mouth daily.        Marland Kitchen  ciclesonide (OMNARIS) 50 MCG/ACT nasal spray Place 2 sprays into both nostrils daily.        . clopidogrel (PLAVIX) 75 MG tablet TAKE 1 TABLET DAILY  90 tablet  3  . Coenzyme Q10 (CO Q-10) 200 MG CAPS Take 1 capsule by mouth daily.        . fish oil-omega-3 fatty acids 1000 MG capsule Take 1 g by mouth 2 (two) times daily.        . furosemide (LASIX) 20 MG tablet Take 20 mg by mouth daily.        . Ginkgo Biloba 120 MG CAPS Take 1 capsule by mouth daily.        . IPRATROPIUM BROMIDE NA Place 2 sprays into the nose daily.        . Multiple Vitamin (MULTIVITAMIN) tablet Take 1 tablet by mouth daily.        Marland Kitchen olopatadine (PATANOL) 0.1 % ophthalmic solution Place 1 drop into both eyes 2 (two) times daily.        . pantoprazole (PROTONIX) 40 MG tablet Take 1 tablet (40 mg total) by mouth daily.  30 tablet  11  . potassium chloride (KLOR-CON) 10 MEQ CR tablet Take 10 mEq by mouth daily.        . potassium chloride SA (K-DUR,KLOR-CON)  20 MEQ tablet Take 40 mEq by mouth 2 (two) times daily.       . propranolol (INNOPRAN XL) 120 MG 24 hr capsule Take 120 mg by mouth at bedtime.        . ranitidine (ZANTAC) 300 MG tablet Take 300 mg by mouth 2 (two) times daily.       . simvastatin (ZOCOR) 40 MG tablet TAKE 1 TABLET AT BEDTIME  90 tablet  3  . thyroid (ARMOUR) 30 MG tablet Take 90 mg by mouth daily.       Marland Kitchen topiramate (TOPAMAX) 25 MG tablet Take 100 mg by mouth at bedtime.       Marland Kitchen azelastine (ASTELIN) 137 MCG/SPRAY nasal spray Place 1 spray into the nose 2 (two) times daily. Use in each nostril as directed       . MAXALT-MLT 10 MG disintegrating tablet as needed.       . nitroGLYCERIN (NITROSTAT) 0.4 MG SL tablet Place 0.4 mg under the tongue every 5 (five) minutes as needed.            Review of Systems Review of Systems  Constitutional: Negative for fever, appetite change, fatigue and unexpected weight change.  Eyes: Negative for pain and visual disturbance.  ENT pos for sinus pain that is improving and hoarseness that is also imp , pos for throat clearing and st Respiratory: Negative for cough and shortness of breath.   Cardiovascular: Negative for cp or palpitations    Gastrointestinal: Negative for nausea, diarrhea and constipation. heartburn is improved  Genitourinary: Negative for urgency and frequency.  Skin: Negative for pallor or rash   MSK pos for joint and muscle aches and pains intermittently Neurological: Negative for weakness, light-headedness, numbness and headaches.  Hematological: Negative for adenopathy. Does not bruise/bleed easily.  Psychiatric/Behavioral: pos for dep and anxiety          Objective:   Physical Exam  Constitutional: She appears well-developed and well-nourished. No distress.       overwt and well appearing  Looks like she is feeling better   HENT:  Head: Normocephalic and atraumatic.  Right Ear: External ear normal.  Left Ear:  External ear normal.  Mouth/Throat: Oropharynx  is clear and moist.       Nares boggy  Some post nasal drip   Eyes: Conjunctivae and EOM are normal. Pupils are equal, round, and reactive to light. Right eye exhibits no discharge.  Neck: Normal range of motion. Neck supple. No JVD present. Carotid bruit is not present. Erythema present. No thyromegaly present.  Cardiovascular: Normal rate, regular rhythm and normal heart sounds.   Pulmonary/Chest: Effort normal and breath sounds normal. No respiratory distress. She has no wheezes.  Abdominal: Soft. Bowel sounds are normal. She exhibits no distension and no mass. There is tenderness. There is no rebound and no guarding.       Mild epigastric tenderness  Musculoskeletal: She exhibits no edema.  Lymphadenopathy:    She has no cervical adenopathy.  Neurological: She is alert. She has normal reflexes. Coordination normal.       Mild hand tremor - nervous   Skin: Skin is warm and dry. No rash noted. No erythema. No pallor.  Psychiatric:       Anxious and depressed but imp from last time- not tearful Somewhat of a child like affect          Assessment & Plan:

## 2010-10-28 NOTE — Assessment & Plan Note (Signed)
Sees Dr Waldo Lions No symptoms Monitoring fxn of plavix in light of PPI -- see prev assessment

## 2010-10-28 NOTE — Assessment & Plan Note (Signed)
Symptoms are partially improved with protonix (in past has done better on bid dosing) In light of concominant dosing with plavix will [id number] platelet inhibition test and update (this was suggested by cardiology) If possible to bid - pt and her ENT would like [place] to In addn- sinus infection is improving  Lab today and will adv

## 2010-10-28 NOTE — Telephone Encounter (Signed)
No answer recording said pt would be notified I called but could not leave v/m.

## 2010-10-30 NOTE — Telephone Encounter (Signed)
Patient notified as instructed by telephone. Pt said that would be fine. I will send this note back to Dr Milinda Antis until get lab results.

## 2010-11-01 ENCOUNTER — Telehealth: Payer: Self-pay | Admitting: *Deleted

## 2010-11-01 NOTE — Telephone Encounter (Signed)
It is ok if she goes back on Monday- please make sure they refund her $ or don't charge her this time- they should also apologize to her thanks

## 2010-11-01 NOTE — Telephone Encounter (Signed)
[facility] lab called a couple of days ago stating that they didn't draw enough blood to do the ordered test.  I left messages for the patient to call me back.  She called today, I explained to her what happened and she  asked if ok if she goes back on Monday, or is that too long to wait. Please advise.

## 2010-11-01 NOTE — Telephone Encounter (Signed)
Left v/m for pt to call back. Aurther Loft said it is fine for pt to go back to lab where she went before and have blood drawn. There will be no addl charge.

## 2010-11-03 NOTE — Progress Notes (Signed)
Pt had to have this re drawn at [facility] due to too small amt of serum

## 2010-11-04 ENCOUNTER — Other Ambulatory Visit: Payer: Self-pay | Admitting: Family Medicine

## 2010-11-04 NOTE — Telephone Encounter (Signed)
LEFT VM FOR PT TO CALL BACK.

## 2010-11-04 NOTE — Telephone Encounter (Signed)
Patient notified as instructed by telephone for pt to go back where had blood drawn before and have redraw. There should not be an add'l charge and I apologized to the pt.

## 2010-11-05 ENCOUNTER — Telehealth: Payer: Self-pay | Admitting: *Deleted

## 2010-11-05 NOTE — Telephone Encounter (Signed)
[facility] Triage Call Report Triage Record Num: [id number] Operator: April Finney Patient Name: [name] [name] Call Date & Time: 11/04/2010 5:44:46PM Patient Phone: [phone] PCP: Audrie Gallus. Tower Patient Gender: Female PCP Fax : Patient DOB: [birth year] Practice Name: [facility] Reason for Call: Mary/[facility] lab calling with stat lab result ordered by Dr.Tower. Platelet 206. Notified Dr.Jones and no new orders. Protocol(s) Used: Office Note Recommended Outcome per Protocol: Information Noted and Sent to Office Reason for Outcome: Caller information to office Care Advice: ~ 11/04/2010 5:59:28PM Page 1 of 1 [email or web address]

## 2010-11-05 NOTE — Telephone Encounter (Signed)
Lab report from [facility] is on your shelf in the in box.

## 2010-11-05 NOTE — Telephone Encounter (Signed)
Message copied by Patience Musca on Tue Nov 05, 2010  5:04 PM ------      Message from: Roxy Manns A      Created: Tue Nov 05, 2010  1:44 PM       Can you call and please get a lab report for her platelet inhibition test (the test that starts with PY12 I think)       Had a call about it but I need the report with the reference range      I think she had it drawn at [facility]? -- thanks

## 2010-11-07 NOTE — Telephone Encounter (Signed)
Left vm for pt to callback 

## 2010-11-07 NOTE — Telephone Encounter (Signed)
Patient notified as instructed by telephone. 

## 2010-11-07 NOTE — Telephone Encounter (Signed)
Please let pt I got her results and the plavix is ok - it is still working, if she wants to increase the protonix to bid (try it for a week to start )- go ahead and let me know if this is helpful ( if not further improved with bid - I would stick to qd)- so update me in a week and let me know where to send the px if she needs it  thanks

## 2010-11-20 ENCOUNTER — Other Ambulatory Visit: Payer: Self-pay

## 2010-11-20 MED ORDER — PANTOPRAZOLE SODIUM 40 MG PO TBEC: 40.0000 mg | DELAYED_RELEASE_TABLET | Freq: Two times a day (BID) | ORAL | Status: DC

## 2010-11-20 NOTE — Telephone Encounter (Signed)
Medication Protonix 40 mg phoned to [facility] [address] [facility] as instructed. Patient notified as instructed by telephone. Med list updated.

## 2010-12-31 ENCOUNTER — Telehealth: Payer: Self-pay | Admitting: Internal Medicine

## 2011-01-09 ENCOUNTER — Other Ambulatory Visit: Payer: Self-pay | Admitting: Family Medicine

## 2011-01-10 ENCOUNTER — Other Ambulatory Visit: Payer: Self-pay | Admitting: Family Medicine

## 2011-01-11 ENCOUNTER — Other Ambulatory Visit: Payer: Self-pay | Admitting: Family Medicine

## 2011-02-05 NOTE — Telephone Encounter (Signed)
Opened in error by Pam Clement 

## 2011-02-13 DIAGNOSIS — N3020 Other chronic cystitis without hematuria: Secondary | ICD-10-CM | POA: Diagnosis not present

## 2011-02-27 DIAGNOSIS — N3020 Other chronic cystitis without hematuria: Secondary | ICD-10-CM | POA: Diagnosis not present

## 2011-02-28 DIAGNOSIS — F331 Major depressive disorder, recurrent, moderate: Secondary | ICD-10-CM | POA: Diagnosis not present

## 2011-03-18 DIAGNOSIS — F331 Major depressive disorder, recurrent, moderate: Secondary | ICD-10-CM | POA: Diagnosis not present

## 2011-04-15 DIAGNOSIS — F331 Major depressive disorder, recurrent, moderate: Secondary | ICD-10-CM | POA: Diagnosis not present

## 2011-04-22 DIAGNOSIS — J370 Chronic laryngitis: Secondary | ICD-10-CM | POA: Diagnosis not present

## 2011-04-22 DIAGNOSIS — J309 Allergic rhinitis, unspecified: Secondary | ICD-10-CM | POA: Diagnosis not present

## 2011-05-20 DIAGNOSIS — E559 Vitamin D deficiency, unspecified: Secondary | ICD-10-CM | POA: Diagnosis not present

## 2011-05-20 DIAGNOSIS — E890 Postprocedural hypothyroidism: Secondary | ICD-10-CM | POA: Diagnosis not present

## 2011-05-20 DIAGNOSIS — E7800 Pure hypercholesterolemia, unspecified: Secondary | ICD-10-CM | POA: Diagnosis not present

## 2011-05-21 DIAGNOSIS — F331 Major depressive disorder, recurrent, moderate: Secondary | ICD-10-CM | POA: Diagnosis not present

## 2011-05-27 DIAGNOSIS — N3020 Other chronic cystitis without hematuria: Secondary | ICD-10-CM | POA: Diagnosis not present

## 2011-06-04 ENCOUNTER — Emergency Department (HOSPITAL_COMMUNITY)
Admission: EM | Admit: 2011-06-04 | Discharge: 2011-06-04 | Disposition: A | Discharge status: Home / Self Care | Payer: Medicare Other | Attending: Emergency Medicine | Admitting: Emergency Medicine

## 2011-06-04 ENCOUNTER — Telehealth: Payer: Self-pay | Admitting: Family Medicine

## 2011-06-04 ENCOUNTER — Encounter (HOSPITAL_COMMUNITY): Payer: Self-pay | Admitting: Emergency Medicine

## 2011-06-04 DIAGNOSIS — Z79899 Other long term (current) drug therapy: Secondary | ICD-10-CM | POA: Insufficient documentation

## 2011-06-04 DIAGNOSIS — E559 Vitamin D deficiency, unspecified: Secondary | ICD-10-CM | POA: Insufficient documentation

## 2011-06-04 DIAGNOSIS — I2510 Atherosclerotic heart disease of native coronary artery without angina pectoris: Secondary | ICD-10-CM | POA: Diagnosis not present

## 2011-06-04 DIAGNOSIS — R109 Unspecified abdominal pain: Secondary | ICD-10-CM | POA: Diagnosis not present

## 2011-06-04 DIAGNOSIS — K625 Hemorrhage of anus and rectum: Secondary | ICD-10-CM | POA: Insufficient documentation

## 2011-06-04 DIAGNOSIS — N390 Urinary tract infection, site not specified: Secondary | ICD-10-CM | POA: Diagnosis not present

## 2011-06-04 DIAGNOSIS — R10813 Right lower quadrant abdominal tenderness: Secondary | ICD-10-CM | POA: Diagnosis not present

## 2011-06-04 DIAGNOSIS — Z7982 Long term (current) use of aspirin: Secondary | ICD-10-CM | POA: Diagnosis not present

## 2011-06-04 DIAGNOSIS — F3289 Other specified depressive episodes: Secondary | ICD-10-CM | POA: Insufficient documentation

## 2011-06-04 DIAGNOSIS — K921 Melena: Secondary | ICD-10-CM | POA: Insufficient documentation

## 2011-06-04 DIAGNOSIS — F329 Major depressive disorder, single episode, unspecified: Secondary | ICD-10-CM | POA: Insufficient documentation

## 2011-06-04 DIAGNOSIS — R197 Diarrhea, unspecified: Secondary | ICD-10-CM | POA: Diagnosis not present

## 2011-06-04 DIAGNOSIS — K219 Gastro-esophageal reflux disease without esophagitis: Secondary | ICD-10-CM | POA: Diagnosis not present

## 2011-06-04 LAB — COMPREHENSIVE METABOLIC PANEL
Albumin: 4.4 g/dL (ref 3.5–5.2)
Alkaline Phosphatase: 65 U/L (ref 39–117)
BUN: 19 mg/dL (ref 6–23)
Creatinine, Ser: 1.32 mg/dL — ABNORMAL HIGH (ref 0.50–1.10)
GFR calc non Af Amer: 44 mL/min — ABNORMAL LOW (ref >90)
Sodium: 137 mEq/L (ref 135–145)
Total Bilirubin: 1.8 mg/dL — ABNORMAL HIGH (ref 0.3–1.2)

## 2011-06-04 LAB — CBC
MCH: 30.1 pg (ref 26.0–34.0)
MCHC: 35.7 g/dL (ref 30.0–36.0)
Platelets: 201 10*3/uL (ref 150–400)
RBC: 4.75 MIL/uL (ref 3.87–5.11)

## 2011-06-04 LAB — DIFFERENTIAL
Eosinophils Absolute: 0.2 10*3/uL (ref 0.0–0.7)
Monocytes Absolute: 1 10*3/uL (ref 0.1–1.0)
Neutrophils Relative %: 60 % (ref 43–77)

## 2011-06-04 LAB — LIPASE, BLOOD: Lipase: 22 U/L (ref 11–59)

## 2011-06-04 LAB — OCCULT BLOOD, POC DEVICE: Fecal Occult Bld: NEGATIVE

## 2011-06-04 MED ORDER — DIPHENOXYLATE-ATROPINE 2.5-0.025 MG PO TABS: 1.0000 | ORAL_TABLET | Freq: Four times a day (QID) | ORAL | Status: AC | PRN

## 2011-06-04 MED ORDER — DIPHENOXYLATE-ATROPINE 2.5-0.025 MG PO TABS
1.0000 | ORAL_TABLET | Freq: Once | ORAL | Status: AC
  Administered 2011-06-04: 1 via ORAL
  Filled 2011-06-04: qty 1

## 2011-06-04 MED ORDER — GI COCKTAIL ~~LOC~~
30.0000 mL | Freq: Once | ORAL | Status: AC
  Administered 2011-06-04: 30 mL via ORAL
  Filled 2011-06-04: qty 30

## 2011-06-04 NOTE — Discharge Instructions (Signed)
There is no blood in her stool.  On examination.  Tonight.  Your blood counts.  Do not show any significant illness.  Use Lomotil for recurrent pain and diarrhea.  Followup with your Dr. for reevaluation and bring a stool sample for testing.  Return for worse or uncontrolled symptoms

## 2011-06-04 NOTE — ED Notes (Signed)
Started having diarrhea Saturday, had some reddish coloring to stool yesterday, today, bright red today, states getting darker

## 2011-06-04 NOTE — Telephone Encounter (Signed)
Caller: Yumiko/Mother; PCP: Tower, Marne A.; CB#: [phone]; ; ; Call regarding Chronic Diarrhea;  Talon states she had onset of diarrhea since 05/31/11.  Taking Immodium with no relief. Having large watery stools. Afebrile.   Onset of bright red blood in stools on 06/03/11. Has had abdominal pain x one year. No vomiting thus far. Has 911 disposition due to passing red material from rectum and onset of signs of hypovolemia- dizzines and exertional weakness. Office notified. Spoke with Triage nurse Rina who spoke with Dr.Tower.  Dr.Tower advised pt be seen in [facility] ED. Care advice given.

## 2011-06-04 NOTE — ED Provider Notes (Addendum)
History     CSN: [id number]  Arrival date & time 06/04/11  1522   First MD Initiated Contact with Patient 06/04/11 1734      Chief Complaint  Patient presents with  . Diarrhea  . Rectal Bleeding    (Consider location/radiation/quality/duration/timing/severity/associated sxs/prior treatment) Patient is a 56 y.o. female presenting with diarrhea and hematochezia. The history is provided by the patient.  Diarrhea The primary symptoms include abdominal pain, diarrhea and hematochezia. Primary symptoms do not include fever, nausea or vomiting.  The illness does not include chills.  Rectal Bleeding  Associated symptoms include abdominal pain and diarrhea. Pertinent negatives include no fever, no nausea and no vomiting.   the patient is a 68, years old female, with chronic urinary tract infections for which she is currently taking Keflex.  She also has a history of GERD She complains of abdominal cramps, and bloody stool.  She denies nausea, vomiting, fevers, chills.  She denies exposure to anyone with similar symptoms.  She denies recent travel.  Past Medical History  Diagnosis Date  . Depression   . Anorexia   . Migraine   . GERD (gastroesophageal reflux disease)   . Seasonal allergies   . Arrhythmia   . CAD (coronary artery disease)   . Frequent UTI   . Edema   . Vitamin d deficiency   . Restless leg syndrome   . Microscopic hematuria     Past Surgical History  Procedure Date  . Cholecystectomy open   . Appendectomy   . Abdominal hysterectomy   . Back surgery 1997    herniated disc repair  . Cystoscopy     neg    Family History  Problem Relation Age of Onset  . Coronary artery disease Mother   . Thyroid disease Mother   . Alzheimer's disease Mother   . Diabetes Mother   . Heart disease Mother   . Cancer Brother     melnoma  . Cancer Paternal Aunt     ovarian  . Cancer Paternal Uncle     bone  . Cancer Maternal Grandmother     pancreatic  . Heart disease  Maternal Grandfather     History  Substance Use Topics  . Smoking status: Former Games developer  . Smokeless tobacco: Not on file  . Alcohol Use: Yes     socially    OB History    Grav Para Term Preterm Abortions TAB SAB Ect Mult Living                  Review of Systems  Constitutional: Negative for fever and chills.  Respiratory: Negative for shortness of breath.   Gastrointestinal: Positive for abdominal pain, diarrhea, blood in stool and hematochezia. Negative for nausea and vomiting.  Neurological: Negative for light-headedness.  All other systems reviewed and are negative.    Allergies  Iohexol  Home Medications   Current Outpatient Rx  Name Route Sig Dispense Refill  . ASPIRIN 325 MG PO TABS Oral Take 325 mg by mouth daily.      . AZELASTINE HCL 137 MCG/SPRAY NA SOLN Nasal Place 1 spray into the nose 2 (two) times daily. Use in each nostril as directed     . CALCIUM 1200-1000 MG-UNIT PO CHEW Oral Chew 1 tablet by mouth 2 (two) times daily.      . CEPHALEXIN 250 MG PO CAPS Oral Take 250 mg by mouth daily.      Marland Kitchen CICLESONIDE 50 MCG/ACT NA SUSP Each  Nare Place 2 sprays into both nostrils daily.      Marland Kitchen CLOPIDOGREL BISULFATE 75 MG PO TABS  TAKE 1 TABLET DAILY 90 tablet 3  . CO Q-10 200 MG PO CAPS Oral Take 1 capsule by mouth daily.      . OMEGA-3 FATTY ACIDS 1000 MG PO CAPS Oral Take 1 g by mouth 2 (two) times daily.      . FUROSEMIDE 20 MG PO TABS  TAKE 1 TABLET DAILY 90 tablet 2  . GINKGO BILOBA 120 MG PO CAPS Oral Take 1 capsule by mouth daily.      . IPRATROPIUM BROMIDE NA Nasal Place 2 sprays into the nose daily.      Marland Kitchen MAXALT-MLT 10 MG PO TBDP  as needed. Migraine    . NITROGLYCERIN 0.4 MG SL SUBL Sublingual Place 0.4 mg under the tongue every 5 (five) minutes as needed. Pain    . OLOPATADINE HCL 0.1 % OP SOLN Both Eyes Place 1 drop into both eyes 2 (two) times daily.      Marland Kitchen PANTOPRAZOLE SODIUM 40 MG PO TBEC Oral Take 1 tablet (40 mg total) by mouth 2 (two) times daily.  60 tablet 11  . POTASSIUM CHLORIDE 10 MEQ PO TBCR Oral Take 10 mEq by mouth daily.      Marland Kitchen POTASSIUM CHLORIDE CRYS ER 20 MEQ PO TBCR  TAKE 2 TABLETS EVERY 12 HOURS 360 tablet 2  . PROPRANOLOL HCL ER BEADS 120 MG PO CP24 Oral Take 120 mg by mouth at bedtime.      Marland Kitchen RANITIDINE HCL 300 MG PO TABS Oral Take 300 mg by mouth 2 (two) times daily.     Marland Kitchen SIMVASTATIN 40 MG PO TABS  TAKE 1 TABLET AT BEDTIME 90 tablet 3  . THYROID 30 MG PO TABS Oral Take 90 mg by mouth daily.     . TOPIRAMATE 25 MG PO TABS Oral Take 100 mg by mouth at bedtime.       BP 108/43  Pulse 64  Temp(Src) 97.6 F (36.4 C) (Oral)  Resp 16  SpO2 99%  Physical Exam  Vitals reviewed. Constitutional: She is oriented to person, place, and time. She appears well-developed and well-nourished. No distress.  HENT:  Head: Normocephalic and atraumatic.  Eyes: Conjunctivae are normal.  Neck: Normal range of motion. Neck supple.  Cardiovascular: Normal rate.   No murmur heard. Pulmonary/Chest: Effort normal. No respiratory distress.  Abdominal: Soft. She exhibits no distension. There is tenderness. There is no rebound and no guarding.       Bilateral lower quadrant tenderness, which is mild.  No peritoneal signs  Genitourinary: Guaiac negative stool.       Rectal examination performed with a CNA chaperone  Musculoskeletal: Normal range of motion. She exhibits no edema.  Neurological: She is alert and oriented to person, place, and time.  Skin: Skin is warm.  Psychiatric: She has a normal mood and affect. Thought content normal.    ED Course  Procedures (including critical care time) Bloody diarrhea and cramps.  Heme-negative in the emergency department.  No acute abdomen  Labs Reviewed  COMPREHENSIVE METABOLIC PANEL - Abnormal; Notable for the following:    Creatinine, Ser 1.32 (*)    Total Bilirubin 1.8 (*)    GFR calc non Af Amer 44 (*)    GFR calc Af Amer 51 (*)    All other components within normal limits  CBC    DIFFERENTIAL  LIPASE, BLOOD  OCCULT BLOOD,  POC DEVICE   No results found.   No diagnosis found.  Patient improved.  She is not able to give a stool sample emergency department  MDM  Bloody diarrhea, by history.  No acute abdomen.  Heme-negative in the emergency department.  Normal vital signs, and no significant anemia        Cheri Guppy, MD 06/04/11 7829  Cheri Guppy, MD 06/04/11 2105

## 2011-06-04 NOTE — ED Notes (Signed)
Pt. Given a urine cup but forgot to give specimen when using the bathroom. Nurse aware

## 2011-06-04 NOTE — Telephone Encounter (Signed)
Darl Pikes with [facility] said pt had diarrhea since 05/31/11,and blood in stool since 06/03/11. Pt said bright red blood in toilet with exertional weakness and dizziness. Pt taking Plavix. Imodium given no relief for diarrhea. Pt has abdominal pain but not unusual; pt had abd pain for 1 year. No fever and no N & V. Pt last seen 10/28/10. [facility] protocol is send to ER. Bernerd Limbo to have pt go to ER for evaluation.

## 2011-06-18 DIAGNOSIS — F331 Major depressive disorder, recurrent, moderate: Secondary | ICD-10-CM | POA: Diagnosis not present

## 2011-07-16 DIAGNOSIS — F331 Major depressive disorder, recurrent, moderate: Secondary | ICD-10-CM | POA: Diagnosis not present

## 2011-07-16 DIAGNOSIS — E890 Postprocedural hypothyroidism: Secondary | ICD-10-CM | POA: Diagnosis not present

## 2011-08-13 DIAGNOSIS — F331 Major depressive disorder, recurrent, moderate: Secondary | ICD-10-CM | POA: Diagnosis not present

## 2011-09-07 ENCOUNTER — Other Ambulatory Visit: Payer: Self-pay | Admitting: Cardiology

## 2011-09-08 ENCOUNTER — Telehealth: Payer: Self-pay

## 2011-09-08 NOTE — Telephone Encounter (Signed)
..   Requested Prescriptions   Pending Prescriptions Disp Refills  . clopidogrel (PLAVIX) 75 MG tablet [Pharmacy Med Name: CLOPIDOGREL 75 MG TABLET] 90 tablet 0    Sig: TAKE 1 TABLET BY MOUTH EVERY DAY  . simvastatin (ZOCOR) 40 MG tablet [Pharmacy Med Name: SIMVASTATIN 40 MG TABLET] 90 tablet 0    Sig: TAKE 1 TABLET BY MOUTH AT BEDTIME  .[name] KitchenPatient needs to contact office to schedule  Appointment  for future refills.Ph:[phone]. Thank you.

## 2011-09-08 NOTE — Telephone Encounter (Signed)
..  Patient needs to contact office to schedule  Appointment  for future refills.Ph:[phone]. Thank you.  

## 2011-09-10 DIAGNOSIS — F331 Major depressive disorder, recurrent, moderate: Secondary | ICD-10-CM | POA: Diagnosis not present

## 2011-09-11 ENCOUNTER — Other Ambulatory Visit: Payer: Self-pay | Admitting: *Deleted

## 2011-09-11 ENCOUNTER — Other Ambulatory Visit: Payer: Self-pay

## 2011-09-11 MED ORDER — POTASSIUM CHLORIDE CRYS ER 20 MEQ PO TBCR: 40.0000 meq | EXTENDED_RELEASE_TABLET | Freq: Two times a day (BID) | ORAL | Status: DC

## 2011-09-11 MED ORDER — FUROSEMIDE 20 MG PO TABS: 20.0000 mg | ORAL_TABLET | Freq: Every day | ORAL | Status: DC

## 2011-09-11 MED ORDER — PROPRANOLOL HCL ER BEADS 120 MG PO CP24: 120.0000 mg | ORAL_CAPSULE | Freq: Every day | ORAL | Status: DC

## 2011-09-11 NOTE — Telephone Encounter (Signed)
Pt called the wrong office

## 2011-09-11 NOTE — Telephone Encounter (Signed)
Pt request refill on furosemide,K 20 meq,propranolol and K 10 meq to [facility] [address] (K 10 meq would not let me list under meds and orders; note not longer available for ordering, need new order. Pt said Dr Anne Hahn did last refill but pt not sure why. Pt did not want to schedule appt at this time;pt has appt with Dr Antoine Poche. Pt almost out of meds.pt last seen 10/28/10.Please advise.

## 2011-09-11 NOTE — Telephone Encounter (Signed)
I will need to see her in nov , then  Will refill electronically

## 2011-09-12 NOTE — Telephone Encounter (Signed)
Left message on patient VM to call and schedule appointment in November.

## 2011-09-23 DIAGNOSIS — N3020 Other chronic cystitis without hematuria: Secondary | ICD-10-CM | POA: Diagnosis not present

## 2011-09-26 ENCOUNTER — Other Ambulatory Visit: Payer: Self-pay

## 2011-10-08 DIAGNOSIS — F331 Major depressive disorder, recurrent, moderate: Secondary | ICD-10-CM | POA: Diagnosis not present

## 2011-10-14 ENCOUNTER — Ambulatory Visit (INDEPENDENT_AMBULATORY_CARE_PROVIDER_SITE_OTHER): Payer: Medicare Other | Admitting: Cardiology

## 2011-10-14 ENCOUNTER — Encounter: Payer: Self-pay | Admitting: Cardiology

## 2011-10-14 VITALS — BP 94/61 | HR 54 | Ht 68.0 in | Wt 214.4 lb

## 2011-10-14 DIAGNOSIS — I25810 Atherosclerosis of coronary artery bypass graft(s) without angina pectoris: Secondary | ICD-10-CM | POA: Diagnosis not present

## 2011-10-14 NOTE — Progress Notes (Signed)
HPI The patient presents for followup of her known coronary disease. Since I last saw her she has had continued episodes of chest discomfort. LAD. It seems to happen for days in a row and then may not have been for awhile.  She describes palpitations that she thinks might be related to her thyroid being out of whack. Gets resting shortness chest discomfort. Her heart might start running away from her. It may last for 2 hours at a time. It starts at rest and goes away spontaneously. She's not sure whether the pattern is increased. She's quite agitated today and discussed about her general state of health and in particular the thyroid problems.  Allergies  Allergen Reactions  . Iohexol      Desc: throat selling resp distress hives.premedicate pt.entered 07/06/04, bsw.     Current Outpatient Prescriptions  Medication Sig Dispense Refill  . aspirin 325 MG tablet Take 325 mg by mouth daily.        Marland Kitchen azelastine (OPTIVAR) 0.05 % ophthalmic solution Place 2 drops into both eyes 2 (two) times daily.      . cephALEXin (KEFLEX) 250 MG capsule Take 250 mg by mouth daily.        . clopidogrel (PLAVIX) 75 MG tablet TAKE 1 TABLET BY MOUTH EVERY DAY  90 tablet  0  . Coenzyme Q10 (CO Q-10) 200 MG CAPS Take 1 capsule by mouth daily.        . fish oil-omega-3 fatty acids 1000 MG capsule Take 1 g by mouth 2 (two) times daily.        . furosemide (LASIX) 20 MG tablet Take 1 tablet (20 mg total) by mouth daily.  90 tablet  0  . Ginkgo Biloba 120 MG CAPS Take 1 capsule by mouth daily.        Marland Kitchen levothyroxine (SYNTHROID, LEVOTHROID) 112 MCG tablet Take 112 mcg by mouth daily.      Marland Kitchen MAXALT-MLT 10 MG disintegrating tablet as needed. Migraine      . nitroGLYCERIN (NITROSTAT) 0.4 MG SL tablet Place 0.4 mg under the tongue every 5 (five) minutes as needed. Pain      . olopatadine (PATANOL) 0.1 % ophthalmic solution Place 1 drop into both eyes 2 (two) times daily.        . pantoprazole (PROTONIX) 40 MG tablet Take 1  tablet (40 mg total) by mouth 2 (two) times daily.  60 tablet  11  . potassium chloride (KLOR-CON) 10 MEQ CR tablet Take 10 mEq by mouth daily.        . potassium chloride SA (K-DUR,KLOR-CON) 20 MEQ tablet Take 2 tablets (40 mEq total) by mouth 2 (two) times daily.  360 tablet  0  . propranolol (INNOPRAN XL) 120 MG 24 hr capsule Take 1 capsule (120 mg total) by mouth at bedtime.  90 capsule  0  . ranitidine (ZANTAC) 300 MG tablet Take 300 mg by mouth 2 (two) times daily.       . simvastatin (ZOCOR) 40 MG tablet TAKE 1 TABLET BY MOUTH AT BEDTIME  90 tablet  0  . topiramate (TOPAMAX) 25 MG tablet Take 100 mg by mouth at bedtime.         Past Medical History  Diagnosis Date  . Depression   . Anorexia   . Migraine   . GERD (gastroesophageal reflux disease)   . Seasonal allergies   . Arrhythmia   . CAD (coronary artery disease)   . Frequent UTI   .  Edema   . Vitamin d deficiency   . Restless leg syndrome   . Microscopic hematuria     Past Surgical History  Procedure Date  . Cholecystectomy open   . Appendectomy   . Abdominal hysterectomy   . Back surgery 1997    herniated disc repair  . Cystoscopy     neg    ROS:  As stated in the HPI and negative for all other systems.  PHYSICAL EXAM BP 94/61  Pulse 54  Ht 5\' 8"  (1.727 m)  Wt 214 lb 6.4 oz (97.251 kg)  BMI 32.60 kg/m2 GENERAL:  Well appearing HEENT:  Pupils equal round and reactive, fundi not visualized, oral mucosa unremarkable NECK:  No jugular venous distention, waveform within normal limits, carotid upstroke brisk and symmetric, no bruits, no thyromegaly LYMPHATICS:  No cervical, inguinal adenopathy LUNGS:  Clear to auscultation bilaterally BACK:  No CVA tenderness CHEST:  Unremarkable HEART:  PMI not displaced or sustained,S1 and S2 within normal limits, no S3, no S4, no clicks, no rubs, no murmurs ABD:  Flat, positive bowel sounds normal in frequency in pitch, no bruits, no rebound, no guarding, no midline  pulsatile mass, no hepatomegaly, no splenomegaly EXT:  2 plus pulses throughout, no edema, no cyanosis no clubbing SKIN:  No rashes no nodules NEURO:  Cranial nerves II through XII grossly intact, motor grossly intact throughout PSYCH:  Cognitively intact, oriented to person place and time  EKG: sinus bradycardia, rate 54, leftward axis, no acute ST-T wave changes.  10/14/2011  ASSESSMENT AND PLAN  Chest pain - The patient's chest pain is somewhat atypical. However, it was atypical in 2008 when she had coronary disease with stenting. Therefore, screening with a stress test is indicated. She will have an exercise perfusion study.  Palpitations - No further work up this is indicated.  No change in meds is indicated.

## 2011-10-14 NOTE — Patient Instructions (Addendum)
Continue current medications as listed.  Your physician has requested that you have a lexiscan myoview. For further information please visit [email or web address] Please follow instruction sheet, as given.  Follow up will be based on these results  Dr Debara Pickett [phone]

## 2011-10-21 ENCOUNTER — Ambulatory Visit (HOSPITAL_COMMUNITY): Payer: Medicare Other | Attending: Cardiology | Admitting: Radiology

## 2011-10-21 VITALS — BP 96/55 | Ht 68.0 in | Wt 212.0 lb

## 2011-10-21 DIAGNOSIS — R002 Palpitations: Secondary | ICD-10-CM | POA: Diagnosis not present

## 2011-10-21 DIAGNOSIS — R000 Tachycardia, unspecified: Secondary | ICD-10-CM | POA: Insufficient documentation

## 2011-10-21 DIAGNOSIS — R0602 Shortness of breath: Secondary | ICD-10-CM

## 2011-10-21 DIAGNOSIS — I25810 Atherosclerosis of coronary artery bypass graft(s) without angina pectoris: Secondary | ICD-10-CM

## 2011-10-21 DIAGNOSIS — I10 Essential (primary) hypertension: Secondary | ICD-10-CM | POA: Insufficient documentation

## 2011-10-21 DIAGNOSIS — R079 Chest pain, unspecified: Secondary | ICD-10-CM | POA: Diagnosis not present

## 2011-10-21 DIAGNOSIS — I2510 Atherosclerotic heart disease of native coronary artery without angina pectoris: Secondary | ICD-10-CM | POA: Diagnosis not present

## 2011-10-21 MED ORDER — REGADENOSON 0.4 MG/5ML IV SOLN
0.4000 mg | Freq: Once | INTRAVENOUS | Status: AC
  Administered 2011-10-21: 0.4 mg via INTRAVENOUS

## 2011-10-21 MED ORDER — TECHNETIUM TC 99M SESTAMIBI GENERIC - CARDIOLITE
11.0000 | Freq: Once | INTRAVENOUS | Status: AC | PRN
  Administered 2011-10-21: 11 via INTRAVENOUS

## 2011-10-21 MED ORDER — AMINOPHYLLINE 25 MG/ML IV SOLN
75.0000 mg | Freq: Once | INTRAVENOUS | Status: AC
  Administered 2011-10-21: 75 mg via INTRAVENOUS

## 2011-10-21 MED ORDER — TECHNETIUM TC 99M SESTAMIBI GENERIC - CARDIOLITE
33.0000 | Freq: Once | INTRAVENOUS | Status: AC | PRN
  Administered 2011-10-21: 33 via INTRAVENOUS

## 2011-10-21 NOTE — Progress Notes (Signed)
[facility] SITE 3 NUCLEAR MED [address] [place] [address] [phone]  Cardiology Nuclear Med Study  [name] [name] is a 56 y.o. female     MRN : [id number]     DOB: [birth year]  Procedure Date: 10/21/2011  Nuclear Med Background Indication for Stress Test:  Evaluation for Ischemia and Stent Patency History:  '03: ECHO: EF: 55-65%, '07 Heart Cath: N/O CAD RCA/LAD Stents/Angioplasty:CFX  '08 MPS: EF: 73% NL Cardiac Risk Factors: Family History - CAD, Hypertension and Lipids  Symptoms:  Chest Pain, Palpitations and Rapid HR   Nuclear Pre-Procedure Caffeine/Decaff Intake:  None NPO After: 10:00pm   Lungs:  clear O2 Sat: 98% on room air. IV 0.9% NS with Angio Cath:  22g  IV Site: R Hand  IV Started by:  Cathlyn Parsons, RN  Chest Size (in):  40 Cup Size: D  Height: 5\' 8"  (1.727 m)  Weight:  212 lb (96.163 kg)  BMI:  Body mass index is 32.23 kg/(m^2). Tech Comments:  Propranolol held x 14 hrs. This patient was symptomatic with the Lexiscan and remained with a headache so she was reversed with Aminophylline 75 mg IV. All of her symptoms resolved completely.    Nuclear Med Study 1 or 2 day study: 1 day  Stress Test Type:  Lexiscan  Reading MD: Olga Millers, MD  Order Authorizing Provider:  Melany Guernsey  Resting Radionuclide: Technetium 12m Sestamibi  Resting Radionuclide Dose: 11.0 mCi   Stress Radionuclide:  Technetium 68m Sestamibi  Stress Radionuclide Dose: 33.0 mCi           Stress Protocol Rest HR: 55 Stress HR: 83  Rest BP: 96/55 Stress BP: 112/69  Exercise Time (min): n/a METS: n/a   Predicted Max HR: 163 bpm % Max HR: 50.92 bpm Rate Pressure Product: 9296   Dose of Adenosine (mg):  n/a Dose of Lexiscan: 0.4 mg  Dose of Atropine (mg): n/a Dose of Dobutamine: n/a mcg/kg/min (at max HR)  Stress Test Technologist: Milana Na, EMT-P  Nuclear Technologist:  Domenic Polite, CNMT     Rest Procedure:  Myocardial perfusion imaging  was performed at rest 45 minutes following the intravenous administration of Technetium 82m Sestamibi. Rest ECG: Sinus Bradycardia  Stress Procedure:  The patient received IV Lexiscan 0.4 mg over 15-seconds.  Technetium 37m Sestamibi injected at 30-seconds.  There were no significant changes, + sob, abdominal pain, and nausea with Lexiscan.  Quantitative spect images were obtained after a 45 minute delay. Stress ECG: No significant change from baseline ECG  QPS Raw Data Images:  Acquisition technically good; normal left ventricular size. Stress Images:  There is decreased uptake in the apex. Rest Images:  There is decreased uptake in the apex. Subtraction (SDS):  No evidence of ischemia. Transient Ischemic Dilatation (Normal <1.22):  1.08 Lung/Heart Ratio (Normal <0.45):  0.50  Quantitative Gated Spect Images QGS EDV:  59 ml QGS ESV:  18 ml  Impression Exercise Capacity:  Lexiscan with no exercise. BP Response:  Normal blood pressure response. Clinical Symptoms:  There is dyspnea. ECG Impression:  No significant ST segment change suggestive of ischemia. Comparison with Prior Nuclear Study: No images to compare  Overall Impression:  Low risk stress nuclear study with a small, severe, fixed apical defect consistent with thinning; small prior infarct cannot be excluded; no ischemia.  LV Ejection Fraction: 69%.  LV Wall Motion:  NL LV Function; NL Wall Motion   Olga Millers

## 2011-10-29 DIAGNOSIS — G43019 Migraine without aura, intractable, without status migrainosus: Secondary | ICD-10-CM | POA: Diagnosis not present

## 2011-10-29 DIAGNOSIS — E890 Postprocedural hypothyroidism: Secondary | ICD-10-CM | POA: Diagnosis not present

## 2011-11-05 DIAGNOSIS — F331 Major depressive disorder, recurrent, moderate: Secondary | ICD-10-CM | POA: Diagnosis not present

## 2011-12-10 ENCOUNTER — Encounter: Payer: Self-pay | Admitting: Family Medicine

## 2011-12-10 ENCOUNTER — Ambulatory Visit (INDEPENDENT_AMBULATORY_CARE_PROVIDER_SITE_OTHER): Payer: Medicare Other | Admitting: Family Medicine

## 2011-12-10 VITALS — BP 108/68 | HR 53 | Temp 98.4°F | Ht 68.0 in | Wt 212.0 lb

## 2011-12-10 DIAGNOSIS — E559 Vitamin D deficiency, unspecified: Secondary | ICD-10-CM

## 2011-12-10 DIAGNOSIS — N259 Disorder resulting from impaired renal tubular function, unspecified: Secondary | ICD-10-CM

## 2011-12-10 DIAGNOSIS — E039 Hypothyroidism, unspecified: Secondary | ICD-10-CM | POA: Insufficient documentation

## 2011-12-10 DIAGNOSIS — R7309 Other abnormal glucose: Secondary | ICD-10-CM

## 2011-12-10 DIAGNOSIS — E785 Hyperlipidemia, unspecified: Secondary | ICD-10-CM | POA: Diagnosis not present

## 2011-12-10 DIAGNOSIS — K219 Gastro-esophageal reflux disease without esophagitis: Secondary | ICD-10-CM

## 2011-12-10 MED ORDER — ESOMEPRAZOLE MAGNESIUM 40 MG PO CPDR: 40.0000 mg | DELAYED_RELEASE_CAPSULE | Freq: Every day | ORAL | Status: DC

## 2011-12-10 MED ORDER — RANITIDINE HCL 300 MG PO TABS: 300.0000 mg | ORAL_TABLET | Freq: Two times a day (BID) | ORAL | Status: DC

## 2011-12-10 MED ORDER — POTASSIUM CHLORIDE ER 10 MEQ PO TBCR: EXTENDED_RELEASE_TABLET | ORAL | Status: DC

## 2011-12-10 MED ORDER — POTASSIUM CHLORIDE CRYS ER 20 MEQ PO TBCR: EXTENDED_RELEASE_TABLET | ORAL | Status: DC

## 2011-12-10 NOTE — Assessment & Plan Note (Signed)
Sees endo Recent dose inc Is very tired and sleeping a lot tsh today

## 2011-12-10 NOTE — Assessment & Plan Note (Signed)
Lab today On proph med for uti

## 2011-12-10 NOTE — Progress Notes (Signed)
Subjective:    Patient ID: [name] [name], female    DOB: 01/23/1956, 56 y.o.   MRN: [id number]  HPI Here for f/u of chronic medical problems   Still has ST almost all the time - does not know what it is and specialists have not been able to resolve it  Sees ENT  Thinks she may have had a virus this week - slept for 3 days straight- worried her family , also had more stomach cramping than usual without n/v/d  Has not seen GI and does not want to   Is feeling better from that  Not sleepy now because she drank some coffee  Has had a sleep study - did not have sleep apnea  None of her medicines have been changed   She cut her zantac 300 back to qd- wonders if it needs to go back to bid  Is off of plavix thankfully  Wt is stable - obese  GERD on protonix bid - this was changed due to plavix - but since she is off plavix now wants to go back to bid nexium because it worked so much better  Agreed to write for that today Zantac 300 needs written for 2 daily    Hyperglycemia Lab Results  Component Value Date   HGBA1C 5.7 10/01/2010    Hypothyroid- sees endo and has just inc her dose slightly She is unhappy because her doctor does not "talk to me" She suspects that she rapidly cycles from hypo to hyper thyroid   Renal insuff Is on keflex for uti suppression- has stabilized ? If drinks enough water   D def in past - does take it  Needs it checked   Hyperlipidemia Lab Results  Component Value Date   CHOL 115 10/01/2010   HDL 38.90* 10/01/2010   LDLCALC 41 10/01/2010   TRIG 174.0* 10/01/2010   CHOLHDL 3 10/01/2010   has CAD- and on simvastatin   Patient Active Problem List  Diagnosis  . UNSPECIFIED VITAMIN D DEFICIENCY  . HYPERLIPIDEMIA  . OBESITY  . DEPRESSIVE DISORDER  . PERIODIC LIMB MOVEMENT DISORDER  . COMMON MIGRAINE  . C A D  . GERD  . RENAL INSUFFICIENCY  . UTI'S, CHRONIC  . DEGENERATIVE DISC DISEASE, LUMBAR SPINE  . SOMNOLENCE  . HYPERSOMNIA  . EDEMA  . Other  Abnormal Glucose  . OTHER ABNORMAL BLOOD CHEMISTRY  . ABNORMAL THYROID FUNCTION TESTS  . HEMATURIA, MICROSCOPIC, HX OF  . Routine general medical examination at a health care facility  . Adverse effects of medication   Past Medical History  Diagnosis Date  . Depression   . Anorexia   . Migraine   . GERD (gastroesophageal reflux disease)   . Seasonal allergies   . Arrhythmia   . CAD (coronary artery disease)   . Frequent UTI   . Edema   . Vitamin D deficiency   . Restless leg syndrome   . Microscopic hematuria   . Hypothyroid    Past Surgical History  Procedure Date  . Cholecystectomy open   . Appendectomy   . Abdominal hysterectomy   . Back surgery 1997    herniated disc repair  . Cystoscopy     neg   History  Substance Use Topics  . Smoking status: Former Smoker    Start date: 10/14/1971  . Smokeless tobacco: Not on file  . Alcohol Use: Yes     Comment: socially   Family History  Problem Relation Age of  Onset  . Coronary artery disease Mother   . Thyroid disease Mother   . Alzheimer's disease Mother   . Diabetes Mother   . Heart disease Mother   . Cancer Brother     melnoma  . Cancer Paternal Aunt     ovarian  . Cancer Paternal Uncle     bone  . Cancer Maternal Grandmother     pancreatic  . Heart disease Maternal Grandfather    Allergies  Allergen Reactions  . Iohexol      Desc: throat selling resp distress hives.premedicate pt.entered 07/06/04, bsw.    Current Outpatient Prescriptions on File Prior to Visit  Medication Sig Dispense Refill  . aspirin 325 MG tablet Take 325 mg by mouth daily.        Marland Kitchen azelastine (OPTIVAR) 0.05 % ophthalmic solution Place 2 drops into both eyes 2 (two) times daily.      . cephALEXin (KEFLEX) 250 MG capsule Take 250 mg by mouth daily.        . Coenzyme Q10 (CO Q-10) 200 MG CAPS Take 1 capsule by mouth daily.        . furosemide (LASIX) 20 MG tablet Take 1 tablet (20 mg total) by mouth daily.  90 tablet  0  . Ginkgo  Biloba 120 MG CAPS Take 1 capsule by mouth daily.        Marland Kitchen levothyroxine (SYNTHROID, LEVOTHROID) 112 MCG tablet Take 112 mcg by mouth daily.      . pantoprazole (PROTONIX) 40 MG tablet Take 1 tablet (40 mg total) by mouth 2 (two) times daily.  60 tablet  11  . propranolol (INNOPRAN XL) 120 MG 24 hr capsule Take 1 capsule (120 mg total) by mouth at bedtime.  90 capsule  0  . ranitidine (ZANTAC) 300 MG tablet Take 300 mg by mouth 2 (two) times daily.       . simvastatin (ZOCOR) 40 MG tablet TAKE 1 TABLET BY MOUTH AT BEDTIME  90 tablet  0  . topiramate (TOPAMAX) 25 MG tablet Take 100 mg by mouth at bedtime.       Marland Kitchen MAXALT-MLT 10 MG disintegrating tablet as needed. Migraine      . nitroGLYCERIN (NITROSTAT) 0.4 MG SL tablet Place 0.4 mg under the tongue every 5 (five) minutes as needed. Pain      . potassium chloride (KLOR-CON) 10 MEQ CR tablet Take 10 mEq by mouth daily.          Review of Systems Review of Systems  Constitutional: Negative for fever, appetite change, and unexpected weight change. pos for extreme fatigue Eyes: Negative for pain and visual disturbance.  ENT pos for chronic sore throat that she "lives with"  Respiratory: Negative for cough and shortness of breath.   Cardiovascular: Negative for cp or palpitations    Gastrointestinal: Negative for nausea, diarrhea and constipation. pos for epigastric cramping - worse than usual last week  Genitourinary: Negative for urgency and frequency.  Skin: Negative for pallor or rash   Neurological: Negative for weakness, light-headedness, numbness and headaches.  Hematological: Negative for adenopathy. Does not bruise/bleed easily.  Psychiatric/Behavioral: pos for dysphoric mood and anxiety , sees counselor that helps        Objective:   Physical Exam  Constitutional: She appears well-developed and well-nourished. No distress.       obese and well appearing   HENT:  Head: Normocephalic and atraumatic.  Right Ear: External ear normal.   Left Ear: External ear normal.  Nose: Nose normal.  Mouth/Throat: Oropharynx is clear and moist.       Throat appears completely clear   Eyes: Conjunctivae normal and EOM are normal. Pupils are equal, round, and reactive to light. Right eye exhibits no discharge. Left eye exhibits no discharge. No scleral icterus.  Neck: Normal range of motion. Neck supple. No JVD present. Carotid bruit is not present. No thyromegaly present.  Cardiovascular: Normal rate, regular rhythm and intact distal pulses.  Exam reveals no gallop.   Pulmonary/Chest: Effort normal and breath sounds normal. No respiratory distress. She has no wheezes.  Abdominal: Soft. Bowel sounds are normal. She exhibits no distension, no abdominal bruit and no mass. There is tenderness.       Mild epigastric tenderness Neg murphy's sign  Musculoskeletal: She exhibits no edema.       No acute joint changes   Lymphadenopathy:    She has no cervical adenopathy.  Neurological: She is alert. She has normal reflexes. No cranial nerve deficit. She exhibits normal muscle tone. Coordination normal.  Skin: Skin is warm and dry. No rash noted. No erythema. No pallor.  Psychiatric: Thought content normal. Her mood appears anxious. Her speech is delayed and tangential. She is withdrawn. Cognition and memory are normal. She exhibits a depressed mood.       Affect is quite depressed and child like  Very tangential and difficult to communicate with  Voices many frustrations and worries about physical symptoms  She is inattentive.          Assessment & Plan:

## 2011-12-10 NOTE — Patient Instructions (Addendum)
Labs today  I sent px to pharmacy Take zantac twice daily and switch to nexium twice daily  If stomach pain does not improve- let's get you to GI

## 2011-12-10 NOTE — Assessment & Plan Note (Addendum)
Pt still has a lot of GI discomfort in general - with intermittent epigastric pain also (? Gastritis) Go back to nexium since no longer on plavix  Inc zantac back to bid  If no imp GI consult- pt declines that now

## 2011-12-11 LAB — CBC WITH DIFFERENTIAL/PLATELET
Basophils Relative: 0.8 % (ref 0.0–3.0)
HCT: 37.8 % (ref 36.0–46.0)
Hemoglobin: 12.6 g/dL (ref 12.0–15.0)
Lymphocytes Relative: 35.2 % (ref 12.0–46.0)
Lymphs Abs: 2.5 10*3/uL (ref 0.7–4.0)
MCHC: 33.3 g/dL (ref 30.0–36.0)
Monocytes Relative: 8.7 % (ref 3.0–12.0)
Neutro Abs: 3.7 10*3/uL (ref 1.4–7.7)
RBC: 4.16 Mil/uL (ref 3.87–5.11)

## 2011-12-11 LAB — COMPREHENSIVE METABOLIC PANEL
AST: 22 U/L (ref 0–37)
BUN: 28 mg/dL — ABNORMAL HIGH (ref 6–23)
Calcium: 9.5 mg/dL (ref 8.4–10.5)
Chloride: 113 mEq/L — ABNORMAL HIGH (ref 96–112)
Creatinine, Ser: 1.3 mg/dL — ABNORMAL HIGH (ref 0.4–1.2)
GFR: 43.27 mL/min — ABNORMAL LOW (ref >60.00)
Total Bilirubin: 0.9 mg/dL (ref 0.3–1.2)

## 2011-12-11 LAB — LIPID PANEL
Cholesterol: 134 mg/dL (ref 0–200)
LDL Cholesterol: 70 mg/dL (ref 0–99)
Triglycerides: 124 mg/dL (ref 0.0–149.0)
VLDL: 24.8 mg/dL (ref 0.0–40.0)

## 2011-12-11 NOTE — Assessment & Plan Note (Signed)
Lab today  Is supplementing

## 2011-12-11 NOTE — Assessment & Plan Note (Signed)
Due for a1c today  Unsure if watching diet  Per pt she "does not eat much"- but suspect very sedentary also

## 2011-12-12 DIAGNOSIS — F331 Major depressive disorder, recurrent, moderate: Secondary | ICD-10-CM | POA: Diagnosis not present

## 2011-12-15 ENCOUNTER — Other Ambulatory Visit: Payer: Self-pay | Admitting: *Deleted

## 2011-12-16 ENCOUNTER — Telehealth: Payer: Self-pay | Admitting: *Deleted

## 2011-12-16 MED ORDER — PANTOPRAZOLE SODIUM 40 MG PO TBEC: 40.0000 mg | DELAYED_RELEASE_TABLET | Freq: Two times a day (BID) | ORAL | Status: DC

## 2011-12-16 NOTE — Telephone Encounter (Signed)
Ok - will change that back - too bad , though since nexium worked better  Px written for call in

## 2011-12-16 NOTE — Telephone Encounter (Signed)
Received faxed refill request for pantoprazole SOD DR 40mg , you last prescribed it on 11/20/10 and it's not on pt's current med list so called pt to clarify if she requested Rx, pt said that last time she was here you chaned her Zantac to Nexium, but her insurance doesn't cover her nexium so pt said she wants to go back to taking pantoprazole if that is okay with you, please advise

## 2011-12-16 NOTE — Telephone Encounter (Signed)
Rx called in as prescribed 

## 2011-12-17 NOTE — Telephone Encounter (Signed)
Opened in error

## 2011-12-31 ENCOUNTER — Other Ambulatory Visit: Payer: Self-pay | Admitting: *Deleted

## 2011-12-31 MED ORDER — PROPRANOLOL HCL ER BEADS 120 MG PO CP24: 120.0000 mg | ORAL_CAPSULE | Freq: Every day | ORAL | Status: DC

## 2011-12-31 NOTE — Telephone Encounter (Signed)
Please give her 6 months of refils, thanks 

## 2011-12-31 NOTE — Telephone Encounter (Signed)
done

## 2011-12-31 NOTE — Telephone Encounter (Signed)
See prev- can have 6 mo of refils

## 2012-01-07 ENCOUNTER — Other Ambulatory Visit: Payer: Self-pay | Admitting: Cardiology

## 2012-01-09 DIAGNOSIS — F331 Major depressive disorder, recurrent, moderate: Secondary | ICD-10-CM | POA: Diagnosis not present

## 2012-02-06 DIAGNOSIS — F331 Major depressive disorder, recurrent, moderate: Secondary | ICD-10-CM | POA: Diagnosis not present

## 2012-03-01 ENCOUNTER — Other Ambulatory Visit: Payer: Self-pay | Admitting: Family Medicine

## 2012-03-01 MED ORDER — FUROSEMIDE 20 MG PO TABS: 20.0000 mg | ORAL_TABLET | Freq: Every day | ORAL | Status: DC

## 2012-03-05 DIAGNOSIS — F331 Major depressive disorder, recurrent, moderate: Secondary | ICD-10-CM | POA: Diagnosis not present

## 2012-04-16 ENCOUNTER — Other Ambulatory Visit: Payer: Self-pay | Admitting: Cardiology

## 2012-05-17 DIAGNOSIS — F331 Major depressive disorder, recurrent, moderate: Secondary | ICD-10-CM | POA: Diagnosis not present

## 2012-05-20 DIAGNOSIS — E7800 Pure hypercholesterolemia, unspecified: Secondary | ICD-10-CM | POA: Diagnosis not present

## 2012-05-20 DIAGNOSIS — E890 Postprocedural hypothyroidism: Secondary | ICD-10-CM | POA: Diagnosis not present

## 2012-05-20 DIAGNOSIS — E559 Vitamin D deficiency, unspecified: Secondary | ICD-10-CM | POA: Diagnosis not present

## 2012-06-14 DIAGNOSIS — F331 Major depressive disorder, recurrent, moderate: Secondary | ICD-10-CM | POA: Diagnosis not present

## 2012-06-30 ENCOUNTER — Other Ambulatory Visit: Payer: Self-pay | Admitting: Family Medicine

## 2012-06-30 NOTE — Telephone Encounter (Signed)
Received fax refill request,no recent appt and no future appt, please advise

## 2012-06-30 NOTE — Telephone Encounter (Signed)
Please schedule f/u in the fall and refill until then, thanks

## 2012-07-01 NOTE — Telephone Encounter (Signed)
Left voicemail requesting pt to call office 

## 2012-07-02 NOTE — Telephone Encounter (Signed)
Left voicemail requesting pt to call office 

## 2012-07-06 NOTE — Telephone Encounter (Signed)
Left voicemail requesting pt to call office 

## 2012-07-07 MED ORDER — PROPRANOLOL HCL ER BEADS 120 MG PO CP24: 120.0000 mg | ORAL_CAPSULE | Freq: Every day | ORAL | Status: DC

## 2012-07-07 NOTE — Telephone Encounter (Signed)
appt scheduled for 10/05/12 and med refilled until then

## 2012-07-08 ENCOUNTER — Telehealth: Payer: Self-pay

## 2012-07-08 NOTE — Telephone Encounter (Signed)
Innopran XL prior auth on Dr Royden Purl shelf for completion.

## 2012-07-09 NOTE — Telephone Encounter (Signed)
Prior auth faxed, and given back to [facility]

## 2012-07-09 NOTE — Telephone Encounter (Signed)
Done and in IN box 

## 2012-07-12 DIAGNOSIS — F331 Major depressive disorder, recurrent, moderate: Secondary | ICD-10-CM | POA: Diagnosis not present

## 2012-07-13 NOTE — Telephone Encounter (Signed)
Denial letter received and on Dr Royden Purl shelf.

## 2012-07-13 NOTE — Telephone Encounter (Signed)
Let pt know that her insurance denies coverage for innopran XL because it is non formulary - if she is taking it for headaches she may want to check in with her neurologist about that - otherwise I need her ok to change to another agent in the same class

## 2012-07-14 NOTE — Telephone Encounter (Signed)
Left voicemail requesting pt to call office 

## 2012-07-20 ENCOUNTER — Other Ambulatory Visit: Payer: Self-pay | Admitting: Cardiology

## 2012-07-21 NOTE — Telephone Encounter (Signed)
Left voicemail requesting pt to call office 

## 2012-07-26 NOTE — Telephone Encounter (Signed)
Pt notified that innopran was denied and she gave a verbal okay to change medication to one that is approved under her insurance, pt would like to know what new med would you change it to

## 2012-07-27 NOTE — Telephone Encounter (Signed)
There are several options and it will depend on her symptoms/ bp and pulse rate off of the med- so have her f/u off of med and also bring her benefits booklet if she has one to see what is covered

## 2012-07-29 NOTE — Telephone Encounter (Signed)
Left voicemail requesting pt to call office 

## 2012-08-05 NOTE — Telephone Encounter (Signed)
Keep it on the books for now and we will see based on what we accomplish at the earlier appt

## 2012-08-05 NOTE — Telephone Encounter (Signed)
Left voicemail requesting pt to call office 

## 2012-08-05 NOTE — Telephone Encounter (Signed)
Pt notified of Dr. Royden Purl comments, f/u appt scheduled for 08/20/12, pt has a f/u appt scheduled for 10/05/12, does she need to keep that appt since she is coming in sooner, please advise

## 2012-08-09 DIAGNOSIS — F331 Major depressive disorder, recurrent, moderate: Secondary | ICD-10-CM | POA: Diagnosis not present

## 2012-08-20 ENCOUNTER — Ambulatory Visit (INDEPENDENT_AMBULATORY_CARE_PROVIDER_SITE_OTHER): Payer: Medicare Other | Admitting: Family Medicine

## 2012-08-20 ENCOUNTER — Encounter: Payer: Self-pay | Admitting: Family Medicine

## 2012-08-20 VITALS — BP 122/64 | HR 50 | Temp 98.9°F | Ht 68.0 in | Wt 212.8 lb

## 2012-08-20 DIAGNOSIS — N259 Disorder resulting from impaired renal tubular function, unspecified: Secondary | ICD-10-CM

## 2012-08-20 DIAGNOSIS — R7309 Other abnormal glucose: Secondary | ICD-10-CM

## 2012-08-20 DIAGNOSIS — G43009 Migraine without aura, not intractable, without status migrainosus: Secondary | ICD-10-CM | POA: Diagnosis not present

## 2012-08-20 LAB — COMPREHENSIVE METABOLIC PANEL
Albumin: 4.5 g/dL (ref 3.5–5.2)
Alkaline Phosphatase: 50 U/L (ref 39–117)
BUN: 24 mg/dL — ABNORMAL HIGH (ref 6–23)
Creat: 1.43 mg/dL — ABNORMAL HIGH (ref 0.50–1.10)
Glucose, Bld: 103 mg/dL — ABNORMAL HIGH (ref 70–99)
Potassium: 4.5 mEq/L (ref 3.5–5.3)
Total Bilirubin: 1.3 mg/dL — ABNORMAL HIGH (ref 0.3–1.2)

## 2012-08-20 MED ORDER — PROPRANOLOL HCL 40 MG PO TABS: 40.0000 mg | ORAL_TABLET | Freq: Two times a day (BID) | ORAL | Status: DC

## 2012-08-20 NOTE — Patient Instructions (Signed)
Change from your current innopran XL to the more frequently dosed inderal 40 mg twice daily  These are both propranolol- this is just dosed more often (so cheaper)  Please go to a pharmacy and get blood pressure checked 1-2 weeks after starting it and let me know if bp is over 140/90 or if you like your pulse is racing or any other symptoms  Make sure to call Dr Clarisa Kindred office about your headaches  If any problems let me know  Labs today  Take care of yourself

## 2012-08-20 NOTE — Progress Notes (Signed)
Subjective:    Patient ID: [name] [name], female    DOB: [birth year], 57 y.o.   MRN: [id number]  HPI Insurance will not pay for her innopran XL   She has been on it for years   She took inderal for years - (thinks it was for 30 years )  Was dx with MVP  (now she knows she does not have it )  Then changed indication for migraines and also a stent  Her doctors changed it to corgard for a while (was dosed 4 times per day)  She could not take it that many times per day   They will pay for propranolol 40 mg   Lab Results  Component Value Date   HGBA1C 5.6 12/10/2011   wants to check another one of those  Dr Talmage Nap wants to keep an eye on this  She went forward with checking blood sugars - staying around 100 or just a little higher   She knows something is going on with her hormones   Headaches are still bad - she has to deal with them - sees Dr Anne Hahn and his physician assistant Took the topamax down by 1/2 and her headaches are worse - is thinking about calling and seeing if she can get and adjustment   Patient Active Problem List   Diagnosis Date Noted  . Hypothyroid 12/10/2011  . Adverse effects of medication 10/28/2010  . Routine general medical examination at a health care facility 09/30/2010  . PERIODIC LIMB MOVEMENT DISORDER 08/16/2008  . HYPERSOMNIA 06/21/2008  . SOMNOLENCE 06/07/2008  . UNSPECIFIED VITAMIN D DEFICIENCY 04/07/2008  . UTI'S, CHRONIC 11/05/2007  . RENAL INSUFFICIENCY 09/16/2007  . Other abnormal glucose 09/16/2007  . HEMATURIA, MICROSCOPIC, HX OF 09/16/2007  . OTHER ABNORMAL BLOOD CHEMISTRY 09/09/2007  . HYPERLIPIDEMIA 08/05/2007  . OBESITY 08/05/2007  . DEPRESSIVE DISORDER 08/05/2007  . COMMON MIGRAINE 08/05/2007  . C A D 08/05/2007  . GERD 08/05/2007  . DEGENERATIVE DISC DISEASE, LUMBAR SPINE 08/05/2007  . EDEMA 08/05/2007   Past Medical History  Diagnosis Date  . Depression   . Anorexia   . Migraine   . GERD (gastroesophageal reflux  disease)   . Seasonal allergies   . Arrhythmia   . CAD (coronary artery disease)   . Frequent UTI   . Edema   . Vitamin D deficiency   . Restless leg syndrome   . Microscopic hematuria   . Hypothyroid    Past Surgical History  Procedure Laterality Date  . Cholecystectomy open    . Appendectomy    . Abdominal hysterectomy    . Back surgery  1997    herniated disc repair  . Cystoscopy      neg   History  Substance Use Topics  . Smoking status: Former Smoker    Start date: 10/14/1971  . Smokeless tobacco: Not on file  . Alcohol Use: Yes     Comment: socially   Family History  Problem Relation Age of Onset  . Coronary artery disease Mother   . Thyroid disease Mother   . Alzheimer's disease Mother   . Diabetes Mother   . Heart disease Mother   . Cancer Brother     melnoma  . Cancer Paternal Aunt     ovarian  . Cancer Paternal Uncle     bone  . Cancer Maternal Grandmother     pancreatic  . Heart disease Maternal Grandfather    Allergies  Allergen Reactions  . Iohexol  Desc: throat selling resp distress hives.premedicate pt.entered 07/06/04, bsw.    Current Outpatient Prescriptions on File Prior to Visit  Medication Sig Dispense Refill  . aspirin 325 MG tablet Take 325 mg by mouth daily.        . cephALEXin (KEFLEX) 250 MG capsule Take 250 mg by mouth daily.        . Coenzyme Q10 (CO Q-10) 200 MG CAPS Take 1 capsule by mouth daily.        . furosemide (LASIX) 20 MG tablet Take 1 tablet (20 mg total) by mouth daily.  90 tablet  2  . Ginkgo Biloba 120 MG CAPS Take 1 capsule by mouth daily.        Marland Kitchen levothyroxine (SYNTHROID, LEVOTHROID) 112 MCG tablet Take 112 mcg by mouth daily.      . pantoprazole (PROTONIX) 40 MG tablet Take 1 tablet (40 mg total) by mouth 2 (two) times daily.  60 tablet  11  . potassium chloride (KLOR-CON 10) 10 MEQ tablet Take 1 pill by mouth once daily  30 tablet  11  . potassium chloride SA (K-DUR,KLOR-CON) 20 MEQ tablet Take 2 pills in  am and 2 pills in pm by mouth  120 tablet  11  . ranitidine (ZANTAC) 300 MG tablet Take 1 tablet (300 mg total) by mouth 2 (two) times daily.  60 tablet  11  . simvastatin (ZOCOR) 40 MG tablet TAKE 1 TABLET BY MOUTH AT BEDTIME  90 tablet  0  . topiramate (TOPAMAX) 25 MG tablet Take 100 mg by mouth at bedtime.       Marland Kitchen azelastine (OPTIVAR) 0.05 % ophthalmic solution Place 2 drops into both eyes 2 (two) times daily.      . nitroGLYCERIN (NITROSTAT) 0.4 MG SL tablet Place 0.4 mg under the tongue every 5 (five) minutes as needed. Pain      . propranolol (INNOPRAN XL) 120 MG 24 hr capsule Take 1 capsule (120 mg total) by mouth at bedtime.  90 capsule  0   No current facility-administered medications on file prior to visit.      Review of Systems Review of Systems  Constitutional: Negative for fever, appetite change, and unexpected weight change. (pos for difficulty loosing weight) Eyes: Negative for pain and visual disturbance.  Respiratory: Negative for cough and shortness of breath.   Cardiovascular: Negative for cp or palpitations    Gastrointestinal: Negative for nausea, diarrhea and constipation.  Genitourinary: Negative for urgency and frequency.  Skin: Negative for pallor or rash   Neurological: Negative for weakness, light-headedness, numbness and pos for chronic migraine headaches Hematological: Negative for adenopathy. Does not bruise/bleed easily.  Psychiatric/Behavioral: pos for dysphoric mood and anxiety.         Objective:   Physical Exam  Constitutional: She appears well-developed and well-nourished. No distress.  obese and well appearing   HENT:  Head: Normocephalic and atraumatic.  Mouth/Throat: Oropharynx is clear and moist.  Eyes: Conjunctivae and EOM are normal. Pupils are equal, round, and reactive to light. Right eye exhibits no discharge. Left eye exhibits no discharge. No scleral icterus.  Neck: Normal range of motion. Neck supple. No JVD present. Carotid bruit  is not present. No thyromegaly present.  Cardiovascular: Normal rate, regular rhythm, normal heart sounds and intact distal pulses.  Exam reveals no gallop.   Pulmonary/Chest: Effort normal and breath sounds normal. No respiratory distress. She has no wheezes.  Abdominal: Soft. Bowel sounds are normal. She exhibits no distension, no abdominal  bruit and no mass. There is no tenderness.  Musculoskeletal: She exhibits no edema.  Lymphadenopathy:    She has no cervical adenopathy.  Neurological: She is alert. She has normal reflexes. No cranial nerve deficit. She exhibits normal muscle tone. Coordination normal.  Skin: Skin is warm and dry. No rash noted. No erythema. No pallor.  Psychiatric: Her mood appears anxious. Her affect is labile. Her speech is tangential. She is slowed. Thought content is not paranoid. She exhibits a depressed mood. She expresses no homicidal and no suicidal ideation.  Pt seems to be down in general with trouble concentrating She voices frustration from all her medical problems          Assessment & Plan:

## 2012-08-22 NOTE — Assessment & Plan Note (Signed)
a1c today Sounds like home sugars are good  Disc low glycemic diet

## 2012-08-22 NOTE — Assessment & Plan Note (Signed)
Lab today Pt is on less topamax now Stressed imp of good fluid intake and avoidance of otc meds

## 2012-08-22 NOTE — Assessment & Plan Note (Signed)
Urged pt to call her neurologist for earlier appt since headaches are worse Due to insurance changes in coverage- change to short acting propranolol dosed bid and will adj as needed

## 2012-08-23 ENCOUNTER — Encounter: Payer: Self-pay | Admitting: Family Medicine

## 2012-08-26 ENCOUNTER — Telehealth: Payer: Self-pay | Admitting: Radiology

## 2012-08-26 NOTE — Telephone Encounter (Signed)
Opened in error

## 2012-09-06 DIAGNOSIS — F331 Major depressive disorder, recurrent, moderate: Secondary | ICD-10-CM | POA: Diagnosis not present

## 2012-10-05 ENCOUNTER — Encounter: Payer: Self-pay | Admitting: Family Medicine

## 2012-10-05 ENCOUNTER — Ambulatory Visit (INDEPENDENT_AMBULATORY_CARE_PROVIDER_SITE_OTHER): Payer: Medicare Other | Admitting: Family Medicine

## 2012-10-05 VITALS — BP 102/64 | HR 64 | Temp 98.6°F | Ht 68.0 in | Wt 207.5 lb

## 2012-10-05 DIAGNOSIS — T887XXA Unspecified adverse effect of drug or medicament, initial encounter: Secondary | ICD-10-CM

## 2012-10-05 DIAGNOSIS — G43009 Migraine without aura, not intractable, without status migrainosus: Secondary | ICD-10-CM | POA: Diagnosis not present

## 2012-10-05 DIAGNOSIS — N390 Urinary tract infection, site not specified: Secondary | ICD-10-CM | POA: Diagnosis not present

## 2012-10-05 DIAGNOSIS — N259 Disorder resulting from impaired renal tubular function, unspecified: Secondary | ICD-10-CM | POA: Diagnosis not present

## 2012-10-05 DIAGNOSIS — T50905A Adverse effect of unspecified drugs, medicaments and biological substances, initial encounter: Secondary | ICD-10-CM

## 2012-10-05 MED ORDER — PROPRANOLOL HCL ER BEADS 120 MG PO CP24: 120.0000 mg | ORAL_CAPSULE | Freq: Every day | ORAL | Status: DC

## 2012-10-05 NOTE — Progress Notes (Signed)
Subjective:    Patient ID: [name] [name], female    DOB: 1955-07-17, 57 y.o.   MRN: [id number]  HPI Here for f/u of chronic medical problems  Due to an insurance issue-had to dose her beta blocker (short acting) bid-- thinks she has more palpitations with that  She found out her insurance would cover the propanolol xl she was on before (if it is not labeled as inderal- and epic does not give [place] that option re: px writing) Headaches- still has a headache every day no matter what  ? When her next appt is -has not called yet  Thinks the long acting helped her anxiety more  bp is ok  Pulse is 64  Renal insuff   Chemistry      Component Value Date/Time   NA 142 08/20/2012 1724   K 4.5 08/20/2012 1724   CL 110 08/20/2012 1724   CO2 22 08/20/2012 1724   BUN 24* 08/20/2012 1724   CREATININE 1.43* 08/20/2012 1724   CREATININE 1.3* 12/10/2011 1557      Component Value Date/Time   CALCIUM 10.3 08/20/2012 1724   ALKPHOS 50 08/20/2012 1724   AST 24 08/20/2012 1724   ALT 27 08/20/2012 1724   BILITOT 1.3* 08/20/2012 1724     her urinary symptoms are "up and down" She is on trimethoprim and keflex  Sees Dr Katharine Look  For this  She takes aleve fairly frequently as well   Takes one aleve at least 4 times per week - she says she cannot stop this   Does have a lot of stomach issues  Stomach hurts  Throat bothers her - feels like it is being "stretched"  Also ulcers in mouth  occ loose stool  Has nausea frequently - every am and then it gets better  Is on protonix  She has had 2 endoscopies in the past - she does not think they got good results either time  These left her very sore  Dr Randa Evens was the last one to attempt it   Drinks water all the time  Avoids sodas and other beverages   Patient Active Problem List   Diagnosis Date Noted  . Hypothyroid 12/10/2011  . Adverse effects of medication 10/28/2010  . Routine general medical examination at a health care facility 09/30/2010  .  PERIODIC LIMB MOVEMENT DISORDER 08/16/2008  . HYPERSOMNIA 06/21/2008  . SOMNOLENCE 06/07/2008  . UNSPECIFIED VITAMIN D DEFICIENCY 04/07/2008  . UTI'S, CHRONIC 11/05/2007  . RENAL INSUFFICIENCY 09/16/2007  . Other abnormal glucose 09/16/2007  . HEMATURIA, MICROSCOPIC, HX OF 09/16/2007  . OTHER ABNORMAL BLOOD CHEMISTRY 09/09/2007  . HYPERLIPIDEMIA 08/05/2007  . OBESITY 08/05/2007  . DEPRESSIVE DISORDER 08/05/2007  . COMMON MIGRAINE 08/05/2007  . C A D 08/05/2007  . GERD 08/05/2007  . DEGENERATIVE DISC DISEASE, LUMBAR SPINE 08/05/2007  . EDEMA 08/05/2007   Past Medical History  Diagnosis Date  . Depression   . Anorexia   . Migraine   . GERD (gastroesophageal reflux disease)   . Seasonal allergies   . Arrhythmia   . CAD (coronary artery disease)   . Frequent UTI   . Edema   . Vitamin D deficiency   . Restless leg syndrome   . Microscopic hematuria   . Hypothyroid    Past Surgical History  Procedure Laterality Date  . Cholecystectomy open    . Appendectomy    . Abdominal hysterectomy    . Back surgery  1997    herniated disc  repair  . Cystoscopy      neg   History  Substance Use Topics  . Smoking status: Former Smoker    Start date: 10/14/1971  . Smokeless tobacco: Not on file  . Alcohol Use: Yes     Comment: socially   Family History  Problem Relation Age of Onset  . Coronary artery disease Mother   . Thyroid disease Mother   . Alzheimer's disease Mother   . Diabetes Mother   . Heart disease Mother   . Cancer Brother     melnoma  . Cancer Paternal Aunt     ovarian  . Cancer Paternal Uncle     bone  . Cancer Maternal Grandmother     pancreatic  . Heart disease Maternal Grandfather    Allergies  Allergen Reactions  . Iohexol      Desc: throat selling resp distress hives.premedicate pt.entered 07/06/04, bsw.    Current Outpatient Prescriptions on File Prior to Visit  Medication Sig Dispense Refill  . aspirin 325 MG tablet Take 325 mg by mouth  daily.        Marland Kitchen azelastine (OPTIVAR) 0.05 % ophthalmic solution Place 2 drops into both eyes 2 (two) times daily.      . cephALEXin (KEFLEX) 250 MG capsule Take 250 mg by mouth daily.        Marland Kitchen CINNAMON PO Take 2,000 mg by mouth 2 (two) times daily.      . Coenzyme Q10 (CO Q-10) 200 MG CAPS Take 1 capsule by mouth daily.        . fish oil-omega-3 fatty acids 1000 MG capsule Take 1.2 g by mouth 2 (two) times daily.      . furosemide (LASIX) 20 MG tablet Take 1 tablet (20 mg total) by mouth daily.  90 tablet  2  . Ginkgo Biloba 120 MG CAPS Take 1 capsule by mouth daily.        Marland Kitchen levothyroxine (SYNTHROID, LEVOTHROID) 112 MCG tablet Take 112 mcg by mouth daily.      . Multiple Vitamin (MULTIVITAMIN) capsule Take 1 capsule by mouth daily.      . nitroGLYCERIN (NITROSTAT) 0.4 MG SL tablet Place 0.4 mg under the tongue every 5 (five) minutes as needed. Pain      . pantoprazole (PROTONIX) 40 MG tablet Take 1 tablet (40 mg total) by mouth 2 (two) times daily.  60 tablet  11  . potassium chloride (KLOR-CON 10) 10 MEQ tablet Take 1 pill by mouth once daily  30 tablet  11  . potassium chloride SA (K-DUR,KLOR-CON) 20 MEQ tablet Take 2 pills in am and 2 pills in pm by mouth  120 tablet  11  . ranitidine (ZANTAC) 300 MG tablet Take 1 tablet (300 mg total) by mouth 2 (two) times daily.  60 tablet  11  . simvastatin (ZOCOR) 40 MG tablet TAKE 1 TABLET BY MOUTH AT BEDTIME  90 tablet  0  . topiramate (TOPAMAX) 25 MG tablet Take 100 mg by mouth at bedtime.       Marland Kitchen trimethoprim (TRIMPEX) 100 MG tablet Take 100 mg by mouth at bedtime.       No current facility-administered medications on file prior to visit.    Review of Systems Review of Systems  Constitutional: Negative for fever, appetite change,  and unexpected weight change. pos for chronic fatigue and malaise Eyes: Negative for pain and visual disturbance.  Respiratory: Negative for cough and shortness of breath.   Cardiovascular: Negative  for cp or  palpitations    Gastrointestinal: Negative for constipation or blood in stool, pos for chronic epigastric pain and nausea and occ loose stool  Genitourinary: Negative for urgency and frequency. neg for dysuria/ pos for episodes of bladder pain for which she takes trimethoprim Skin: Negative for pallor or rash   Neurological: Negative for weakness, light-headedness, numbness and pos for severe daily headaches Hematological: Negative for adenopathy. Does not bruise/bleed easily.  Psychiatric/Behavioral: pos for dysphoric mood and anxiety-not well controlled          Objective:   Physical Exam  Constitutional: She appears well-developed and well-nourished. No distress.  obese and well appearing   HENT:  Head: Normocephalic and atraumatic.  Mouth/Throat: Oropharynx is clear and moist.  Eyes: Conjunctivae and EOM are normal. Pupils are equal, round, and reactive to light. Right eye exhibits no discharge. Left eye exhibits no discharge. No scleral icterus.  Neck: Normal range of motion. Neck supple. No JVD present. Carotid bruit is not present. No thyromegaly present.  Cardiovascular: Normal rate, regular rhythm and intact distal pulses.  Exam reveals no gallop.   Pulmonary/Chest: Effort normal and breath sounds normal. No respiratory distress. She has no wheezes. She has no rales.  Abdominal: Soft. Bowel sounds are normal. She exhibits no distension, no abdominal bruit and no mass. There is tenderness. There is no rebound and no guarding.  Mild epigastric tenderness   Musculoskeletal: She exhibits no edema and no tenderness.  Lymphadenopathy:    She has no cervical adenopathy.  Neurological: She is alert. She has normal reflexes. No cranial nerve deficit. She exhibits normal muscle tone. Coordination normal.  Skin: Skin is warm and dry. No rash noted. No erythema. No pallor.  Psychiatric: Her mood appears anxious. Her speech is tangential. Thought content is not paranoid. She exhibits a  depressed mood. She expresses no homicidal and no suicidal ideation.  Pt has a childlike affect  Gets upset when talking about physical symptoms  Unsure if she has good insight          Assessment & Plan:

## 2012-10-05 NOTE — Patient Instructions (Addendum)
If you want to see a GI doctor in the future for your abdominal and throat symptoms (GERD) - let me know  Continue protonix  Try to avoid aleve as much as possible - this is hard on the stomach and kidney and can make GERD worse  I will send propranolol XL 120 mg to your pharmacy  Please leave a urine specimen on the way out  Make sure to get in touch with your headache doctor also

## 2012-10-07 LAB — URINE CULTURE: Organism ID, Bacteria: NO GROWTH

## 2012-10-07 NOTE — Assessment & Plan Note (Signed)
This is severe and daily - urged pt once again to contact her headache physician which she has not done yet  She takes aleve which is nephrotoxic and her cr is up -worrisome- when explained to her that nsaids are contraindicated she states she cannot stop them due to disabling pain

## 2012-10-07 NOTE — Assessment & Plan Note (Addendum)
Will go back to propranolol xl - ins covers it if not labeled as inderal Epic prohibits me from this - so I hand wrote on px to please subs generic propranolol xl This is for her ha She dislikes the bid propranolol and has some side eff  Also counseled on nephrotoxicity of her nsaid- which she refuses to stop due to bad headaches

## 2012-10-07 NOTE — Assessment & Plan Note (Signed)
Pt has chronic utis Also takes aleve for headaches and refuses to stop it (despite counseling about nsaids and kidney dz) She takes trmethoprim prn and keflex daily ucx today and will send to urologist Enc her to keep up a good water intake

## 2012-10-07 NOTE — Assessment & Plan Note (Signed)
cx urine today  Cr is up -this is worrisome She is on proph keflex and trimethoprim prn for bladder symptoms from urology

## 2012-10-19 DIAGNOSIS — F331 Major depressive disorder, recurrent, moderate: Secondary | ICD-10-CM | POA: Diagnosis not present

## 2012-10-21 ENCOUNTER — Other Ambulatory Visit: Payer: Self-pay | Admitting: Cardiology

## 2012-11-02 ENCOUNTER — Other Ambulatory Visit: Payer: Self-pay | Admitting: *Deleted

## 2012-11-02 MED ORDER — PANTOPRAZOLE SODIUM 40 MG PO TBEC: 40.0000 mg | DELAYED_RELEASE_TABLET | Freq: Two times a day (BID) | ORAL | Status: DC

## 2012-11-04 MED ORDER — PANTOPRAZOLE SODIUM 40 MG PO TBEC: 40.0000 mg | DELAYED_RELEASE_TABLET | Freq: Two times a day (BID) | ORAL | Status: DC

## 2012-11-04 NOTE — Addendum Note (Signed)
Addended by: Shon Millet on: 11/04/2012 03:20 PM   Modules accepted: Orders

## 2012-11-09 ENCOUNTER — Other Ambulatory Visit: Payer: Self-pay | Admitting: *Deleted

## 2012-11-09 MED ORDER — FUROSEMIDE 20 MG PO TABS: 20.0000 mg | ORAL_TABLET | Freq: Every day | ORAL | Status: DC

## 2012-11-16 DIAGNOSIS — F331 Major depressive disorder, recurrent, moderate: Secondary | ICD-10-CM | POA: Diagnosis not present

## 2012-11-22 ENCOUNTER — Other Ambulatory Visit: Payer: Self-pay | Admitting: *Deleted

## 2012-11-22 NOTE — Telephone Encounter (Signed)
Not currently on med list - ? Still on it  Also - I think this is px by her headache physician-correct me if I'm wrong-please check with pt

## 2012-11-22 NOTE — Telephone Encounter (Signed)
Fax refill request, please advise  

## 2012-11-25 ENCOUNTER — Other Ambulatory Visit: Payer: Self-pay | Admitting: Family Medicine

## 2012-11-25 NOTE — Telephone Encounter (Signed)
[facility] doctor, Dr Anne Hahn does prescribed medication, Rx declined and advised pharmacy Dr Anne Hahn prescribes medication

## 2012-12-02 ENCOUNTER — Other Ambulatory Visit: Payer: Self-pay

## 2012-12-13 DIAGNOSIS — F331 Major depressive disorder, recurrent, moderate: Secondary | ICD-10-CM | POA: Diagnosis not present

## 2012-12-21 ENCOUNTER — Other Ambulatory Visit: Payer: Self-pay | Admitting: *Deleted

## 2012-12-21 MED ORDER — POTASSIUM CHLORIDE CRYS ER 20 MEQ PO TBCR: EXTENDED_RELEASE_TABLET | ORAL | Status: DC

## 2012-12-21 MED ORDER — RANITIDINE HCL 300 MG PO TABS: 300.0000 mg | ORAL_TABLET | Freq: Two times a day (BID) | ORAL | Status: DC

## 2013-01-03 ENCOUNTER — Other Ambulatory Visit: Payer: Self-pay

## 2013-01-03 MED ORDER — POTASSIUM CHLORIDE ER 10 MEQ PO TBCR: EXTENDED_RELEASE_TABLET | ORAL | Status: DC

## 2013-01-07 DIAGNOSIS — F331 Major depressive disorder, recurrent, moderate: Secondary | ICD-10-CM | POA: Diagnosis not present

## 2013-01-18 ENCOUNTER — Telehealth: Payer: Self-pay

## 2013-01-18 NOTE — Telephone Encounter (Signed)
Samantha at [address] left v/m having insurance issues with pt being on potassium 10 meq and 20 meq; Pharmacist request new rx for potassium 20 meq with instructions take 2 tab by mouth every morning and 2 tab every evening and 1/2 tab as needed and d/c 10 meq tab.Please advise.

## 2013-01-18 NOTE — Telephone Encounter (Signed)
Please call pt - let her know about the change and confirm dosing with her  Then call in 6 mo supply of each Thanks

## 2013-01-21 ENCOUNTER — Other Ambulatory Visit: Payer: Self-pay | Admitting: Cardiology

## 2013-01-21 MED ORDER — POTASSIUM CHLORIDE CRYS ER 20 MEQ PO TBCR: EXTENDED_RELEASE_TABLET | ORAL | Status: DC

## 2013-01-21 NOTE — Telephone Encounter (Signed)
Rx with new directions for the K sent to pharmacy and pharmacy notified and I left voicemail on pt's phone letting her know of the new dose changes

## 2013-01-24 ENCOUNTER — Other Ambulatory Visit: Payer: Self-pay | Admitting: *Deleted

## 2013-01-24 NOTE — Telephone Encounter (Signed)
Fax refill request, not on current med list, please advise 

## 2013-01-24 NOTE — Telephone Encounter (Signed)
I think her neurologist/ headache doctor px that ---correct me please if I'm wrong

## 2013-01-25 ENCOUNTER — Telehealth: Payer: Self-pay | Admitting: *Deleted

## 2013-01-25 NOTE — Telephone Encounter (Signed)
Rx has already been declined and advised pharmacy that pt needs to call HA doctor

## 2013-01-25 NOTE — Telephone Encounter (Signed)
Faxed request for TOPIRAMATE 50 mg tablet take 1 tablet by mouth twice daily, not on med,  list last filled 10/26/12

## 2013-01-25 NOTE — Telephone Encounter (Signed)
I have already had a call about that - and requested clarification from the pt re: not on med list anymore and also I thought her headache doctor may be px that  thanks

## 2013-01-25 NOTE — Telephone Encounter (Signed)
Rx declined and pharmacy advise her neuro/HA doctor prescribes this Rx not Dr. Milinda Antis

## 2013-02-21 DIAGNOSIS — F331 Major depressive disorder, recurrent, moderate: Secondary | ICD-10-CM | POA: Diagnosis not present

## 2013-03-18 ENCOUNTER — Encounter: Payer: Self-pay | Admitting: Neurology

## 2013-03-18 ENCOUNTER — Ambulatory Visit (INDEPENDENT_AMBULATORY_CARE_PROVIDER_SITE_OTHER): Payer: Medicare Other | Admitting: Neurology

## 2013-03-18 VITALS — BP 114/71 | HR 57 | Wt 208.0 lb

## 2013-03-18 DIAGNOSIS — G43009 Migraine without aura, not intractable, without status migrainosus: Secondary | ICD-10-CM | POA: Diagnosis not present

## 2013-03-18 MED ORDER — SUMATRIPTAN SUCCINATE 100 MG PO TABS: 100.0000 mg | ORAL_TABLET | Freq: Two times a day (BID) | ORAL | Status: DC | PRN

## 2013-03-18 MED ORDER — TOPIRAMATE 100 MG PO TABS: ORAL_TABLET | ORAL | Status: DC

## 2013-03-18 NOTE — Progress Notes (Signed)
Reason for visit: Migraine headache  [name] [name] is an 58 y.o. female  History of present illness:  [name] [name] is a 58 year old right-handed white female with a history of migraine headaches. In the past, the headaches have been relatively intractable. On 200 mg of Topamax, the patient would have 2 headaches a week, the patient has run out of Topamax since she has not been in the office since October 2013. The patient been off of Topamax for 2 months, and the headaches have become daily in nature. The patient indicates that in the past, Axert has been effective for her headaches, but her insurance company will not pay for this. The patient has been on Maxalt without benefit. The patient does have photophobia and phonophobia with headache. The patient may have nausea without vomiting. The headaches are left greater than right-sided in nature. The patient returns for an evaluation.  Past Medical History  Diagnosis Date  . Depression   . Anorexia   . Migraine   . GERD (gastroesophageal reflux disease)   . Seasonal allergies   . Arrhythmia   . CAD (coronary artery disease)   . Frequent UTI   . Edema   . Vitamin D deficiency   . Restless leg syndrome   . Microscopic hematuria   . Hypothyroid     Past Surgical History  Procedure Laterality Date  . Cholecystectomy open    . Appendectomy    . Abdominal hysterectomy    . Back surgery  1997    herniated disc repair  . Cystoscopy      neg    Family History  Problem Relation Age of Onset  . Coronary artery disease Mother   . Thyroid disease Mother   . Alzheimer's disease Mother   . Diabetes Mother   . Heart disease Mother   . Cancer Brother     melnoma  . Cancer Paternal Aunt     ovarian  . Cancer Paternal Uncle     bone  . Cancer Maternal Grandmother     pancreatic  . Heart disease Maternal Grandfather     Social history:  reports that she has quit smoking. She started smoking about 41 years ago. She has never  used smokeless tobacco. She reports that she drinks alcohol. She reports that she does not use illicit drugs.    Allergies  Allergen Reactions  . Iohexol      Desc: throat selling resp distress hives.premedicate pt.entered 07/06/04, bsw.     Medications:  Current Outpatient Prescriptions on File Prior to Visit  Medication Sig Dispense Refill  . aspirin 325 MG tablet Take 325 mg by mouth daily.        Marland Kitchen azelastine (OPTIVAR) 0.05 % ophthalmic solution Place 2 drops into both eyes 2 (two) times daily.      . cephALEXin (KEFLEX) 250 MG capsule Take 250 mg by mouth daily.        Marland Kitchen CINNAMON PO Take 2,000 mg by mouth 2 (two) times daily.      . Coenzyme Q10 (CO Q-10) 200 MG CAPS Take 1 capsule by mouth daily.        . fish oil-omega-3 fatty acids 1000 MG capsule Take 1.2 g by mouth 2 (two) times daily.      . furosemide (LASIX) 20 MG tablet Take 1 tablet (20 mg total) by mouth daily.  90 tablet  1  . Ginkgo Biloba 120 MG CAPS Take 1 capsule by mouth daily.        Marland Kitchen  levothyroxine (SYNTHROID, LEVOTHROID) 112 MCG tablet Take 112 mcg by mouth daily.      . Multiple Vitamin (MULTIVITAMIN) capsule Take 1 capsule by mouth daily.      . nitroGLYCERIN (NITROSTAT) 0.4 MG SL tablet Place 0.4 mg under the tongue every 5 (five) minutes as needed. Pain      . pantoprazole (PROTONIX) 40 MG tablet Take 1 tablet (40 mg total) by mouth 2 (two) times daily.  60 tablet  6  . potassium chloride SA (K-DUR,KLOR-CON) 20 MEQ tablet Take 2 pills in am and 2 pills in pm by mouth and 1/2 tablet at bedtime  135 tablet  5  . propranolol (INNOPRAN XL) 120 MG 24 hr capsule Take 1 capsule (120 mg total) by mouth at bedtime.  30 capsule  11  . ranitidine (ZANTAC) 300 MG tablet Take 1 tablet (300 mg total) by mouth 2 (two) times daily.  60 tablet  3  . simvastatin (ZOCOR) 40 MG tablet TAKE 1 TABLET BY MOUTH AT BEDTIME  90 tablet  0  . trimethoprim (TRIMPEX) 100 MG tablet Take 100 mg by mouth at bedtime.       No current  facility-administered medications on file prior to visit.    ROS:  Out of a complete 14 system review of symptoms, the patient complains only of the following symptoms, and all other reviewed systems are negative.  Appetite change, fatigue Trouble swallowing Light sensitivity, loss of vision Choking Palpitations of the heart Cold intolerance Abdominal pain, nausea Restless legs, daytime sleepiness, snoring, sleep talking Joint pain, back pain, achy muscles Headache, numbness, speech difficulties  Blood pressure 114/71, pulse 57, weight 208 lb (94.348 kg).  Physical Exam  General: The patient is alert and cooperative at the time of the examination. The patient is moderately obese.  Skin: No significant peripheral edema is noted.   Neurologic Exam  Mental status: The patient is oriented x 3.  Cranial nerves: Facial symmetry is present. Speech is normal, no aphasia or dysarthria is noted. Extraocular movements are full. Visual fields are full.  Motor: The patient has good strength in all 4 extremities.  Sensory examination: Soft touch sensation is symmetric on the face, arms, or legs.  Coordination: The patient has good finger-nose-finger and heel-to-shin bilaterally.  Gait and station: The patient has a normal gait. Tandem gait is normal. Romberg is negative. No drift is seen.  Reflexes: Deep tendon reflexes are symmetric.   Assessment/Plan:  One. Intractable migraine   The patient is having frequent headaches off of Topamax at this point. The patient will be placed on Topamax, going on 50 mg daily, and going up by 50 mg every 2 weeks until getting to 100 mg twice daily. The patient will be given a prescription for Imitrex, as the Axert is not preferred by her insurance. If the Imitrex is not effective, we may try to get an exception through the insurance company for [place]. The patient will followup through this office in 6 months.  [name] Alexanders MD 03/18/2013 7:09  PM  [facility] [address] [place], [place] [address]  Phone [phone] Fax [phone]

## 2013-03-18 NOTE — Patient Instructions (Signed)
Migraine Headache A migraine headache is an intense, throbbing pain on one or both sides of your head. A migraine can last for 30 minutes to several hours. CAUSES  The exact cause of a migraine headache is not always known. However, a migraine may be caused when nerves in the brain become irritated and release chemicals that cause inflammation. This causes pain. Certain things may also trigger migraines, such as:  Alcohol.  Smoking.  Stress.  Menstruation.  Aged cheeses.  Foods or drinks that contain nitrates, glutamate, aspartame, or tyramine.  Lack of sleep.  Chocolate.  Caffeine.  Hunger.  Physical exertion.  Fatigue.  Medicines used to treat chest pain (nitroglycerine), birth control pills, estrogen, and some blood pressure medicines. SIGNS AND SYMPTOMS  Pain on one or both sides of your head.  Pulsating or throbbing pain.  Severe pain that prevents daily activities.  Pain that is aggravated by any physical activity.  Nausea, vomiting, or both.  Dizziness.  Pain with exposure to bright lights, loud noises, or activity.  General sensitivity to bright lights, loud noises, or smells. Before you get a migraine, you may get warning signs that a migraine is coming (aura). An aura may include:  Seeing flashing lights.  Seeing bright spots, halos, or zig-zag lines.  Having tunnel vision or blurred vision.  Having feelings of numbness or tingling.  Having trouble talking.  Having muscle weakness. DIAGNOSIS  A migraine headache is often diagnosed based on:  Symptoms.  Physical exam.  A CT scan or MRI of your head. These imaging tests cannot diagnose migraines, but they can help rule out other causes of headaches. TREATMENT Medicines may be given for pain and nausea. Medicines can also be given to help prevent recurrent migraines.  HOME CARE INSTRUCTIONS  Only take over-the-counter or prescription medicines for pain or discomfort as directed by your  health care provider. The use of long-term narcotics is not recommended.  Lie down in a dark, quiet room when you have a migraine.  Keep a journal to find out what may trigger your migraine headaches. For example, write down:  What you eat and drink.  How much sleep you get.  Any change to your diet or medicines.  Limit alcohol consumption.  Quit smoking if you smoke.  Get 7 9 hours of sleep, or as recommended by your health care provider.  Limit stress.  Keep lights dim if bright lights bother you and make your migraines worse. SEEK IMMEDIATE MEDICAL CARE IF:   Your migraine becomes severe.  You have a fever.  You have a stiff neck.  You have vision loss.  You have muscular weakness or loss of muscle control.  You start losing your balance or have trouble walking.  You feel faint or pass out.  You have severe symptoms that are different from your first symptoms. MAKE SURE YOU:   Understand these instructions.  Will watch your condition.  Will get help right away if you are not doing well or get worse. Document Released: 01/13/2005 Document Revised: 11/03/2012 Document Reviewed: 09/20/2012 [employer] Patient Information 2014 [employer].  

## 2013-03-21 DIAGNOSIS — F331 Major depressive disorder, recurrent, moderate: Secondary | ICD-10-CM | POA: Diagnosis not present

## 2013-04-18 DIAGNOSIS — F331 Major depressive disorder, recurrent, moderate: Secondary | ICD-10-CM | POA: Diagnosis not present

## 2013-04-26 ENCOUNTER — Other Ambulatory Visit: Payer: Self-pay | Admitting: *Deleted

## 2013-04-26 MED ORDER — FUROSEMIDE 20 MG PO TABS: 20.0000 mg | ORAL_TABLET | Freq: Every day | ORAL | Status: DC

## 2013-05-06 ENCOUNTER — Telehealth: Payer: Self-pay

## 2013-05-06 ENCOUNTER — Other Ambulatory Visit: Payer: Self-pay

## 2013-05-06 MED ORDER — SIMVASTATIN 40 MG PO TABS: ORAL_TABLET | ORAL | Status: DC

## 2013-05-10 NOTE — Telephone Encounter (Signed)
Error

## 2013-05-23 DIAGNOSIS — E890 Postprocedural hypothyroidism: Secondary | ICD-10-CM | POA: Diagnosis not present

## 2013-06-01 ENCOUNTER — Other Ambulatory Visit: Payer: Self-pay | Admitting: *Deleted

## 2013-06-01 MED ORDER — PANTOPRAZOLE SODIUM 40 MG PO TBEC: 40.0000 mg | DELAYED_RELEASE_TABLET | Freq: Two times a day (BID) | ORAL | Status: DC

## 2013-06-15 ENCOUNTER — Other Ambulatory Visit: Payer: Self-pay

## 2013-06-15 MED ORDER — SIMVASTATIN 40 MG PO TABS: ORAL_TABLET | ORAL | Status: DC

## 2013-06-23 DIAGNOSIS — F331 Major depressive disorder, recurrent, moderate: Secondary | ICD-10-CM | POA: Diagnosis not present

## 2013-07-09 ENCOUNTER — Other Ambulatory Visit: Payer: Self-pay | Admitting: Neurology

## 2013-07-20 ENCOUNTER — Other Ambulatory Visit: Payer: Self-pay

## 2013-07-20 MED ORDER — SIMVASTATIN 40 MG PO TABS: ORAL_TABLET | ORAL | Status: DC

## 2013-08-01 ENCOUNTER — Other Ambulatory Visit: Payer: Self-pay | Admitting: Neurology

## 2013-08-01 ENCOUNTER — Other Ambulatory Visit: Payer: Self-pay | Admitting: Family Medicine

## 2013-08-04 ENCOUNTER — Telehealth: Payer: Self-pay | Admitting: Cardiology

## 2013-08-04 MED ORDER — SIMVASTATIN 40 MG PO TABS: ORAL_TABLET | ORAL | Status: DC

## 2013-08-04 NOTE — Telephone Encounter (Signed)
Pt need her simvastatin until she see Dr Warren Lacy on August 13th,she is completely out of her medicine.Please call this in to [facility].

## 2013-08-04 NOTE — Telephone Encounter (Signed)
Returned call to patient she stated she needs refill for simvastatin.Advised last office visit with Dr.Hochrein 10/14/11,will send refill to pharmacy.Advised to keep appointment with Dr.Hochrein 09/08/13 at 3:15 pm.

## 2013-08-08 DIAGNOSIS — F331 Major depressive disorder, recurrent, moderate: Secondary | ICD-10-CM | POA: Diagnosis not present

## 2013-08-30 ENCOUNTER — Other Ambulatory Visit: Payer: Self-pay | Admitting: Family Medicine

## 2013-09-01 ENCOUNTER — Other Ambulatory Visit: Payer: Self-pay | Admitting: Family Medicine

## 2013-09-05 DIAGNOSIS — F331 Major depressive disorder, recurrent, moderate: Secondary | ICD-10-CM | POA: Diagnosis not present

## 2013-09-08 ENCOUNTER — Encounter: Payer: Self-pay | Admitting: Cardiology

## 2013-09-08 ENCOUNTER — Ambulatory Visit (INDEPENDENT_AMBULATORY_CARE_PROVIDER_SITE_OTHER): Payer: Medicare Other | Admitting: Cardiology

## 2013-09-08 VITALS — BP 100/60 | HR 49 | Ht 68.0 in | Wt 201.0 lb

## 2013-09-08 DIAGNOSIS — I2510 Atherosclerotic heart disease of native coronary artery without angina pectoris: Secondary | ICD-10-CM | POA: Diagnosis not present

## 2013-09-08 DIAGNOSIS — E782 Mixed hyperlipidemia: Secondary | ICD-10-CM | POA: Diagnosis not present

## 2013-09-08 NOTE — Progress Notes (Signed)
HPI The patient presents for followup of her known coronary disease. Since I last saw her she has had continued episodes of chest discomfort.  It is very difficult to sort out whether this is musculoskeletal or related to previous CAD , or whether this is similar to previous heart pain. However, she did have a workup in 2013 most recently for atypical symptoms with a negative stress perfusion study. She says at times when the discomfort occurs she is able to do some exercising or stretching it may go away after several days.  She has been able to walk on a treadmill that she bought without bringing on these symptoms. There has been no new shortness of breath, PND or orthopnea. There have been no reported palpitations, presyncope or syncope.  Allergies  Allergen Reactions  . Iohexol      Desc: throat selling resp distress hives.premedicate pt.entered 07/06/04, bsw.     Current Outpatient Prescriptions  Medication Sig Dispense Refill  . aspirin 325 MG tablet Take 325 mg by mouth daily.       . cephALEXin (KEFLEX) 250 MG capsule Take 250 mg by mouth daily.        . Coenzyme Q10 (CO Q-10) 200 MG CAPS Take 1 capsule by mouth daily.        . fish oil-omega-3 fatty acids 1000 MG capsule Take 1.2 g by mouth 2 (two) times daily.      . furosemide (LASIX) 20 MG tablet Take 1 tablet (20 mg total) by mouth daily.  90 tablet  1  . Ginkgo Biloba 120 MG CAPS Take 1 capsule by mouth daily.        Marland Kitchen levothyroxine (SYNTHROID, LEVOTHROID) 112 MCG tablet Take 112 mcg by mouth daily.      . Multiple Vitamin (MULTIVITAMIN) capsule Take 1 capsule by mouth daily.      . nitroGLYCERIN (NITROSTAT) 0.4 MG SL tablet Place 0.4 mg under the tongue every 5 (five) minutes as needed. Pain      . pantoprazole (PROTONIX) 40 MG tablet Take 1 tablet (40 mg total) by mouth 2 (two) times daily.  60 tablet  6  . potassium chloride SA (KLOR-CON M20) 20 MEQ tablet Take 2 tablets in am and 2 tablets in pm and 1/2 tablet at bedtime. *  needs appointment for fasting labs, and with Dr for more refills.  135 tablet  0  . propranolol (INNOPRAN XL) 120 MG 24 hr capsule Take 1 capsule (120 mg total) by mouth at bedtime.  30 capsule  11  . ranitidine (ZANTAC) 300 MG tablet Take 1 tablet (300 mg total) by mouth 2 (two) times daily.  60 tablet  3  . simvastatin (ZOCOR) 40 MG tablet TAKE 1 TABLET BY MOUTH AT BEDTIME  30 tablet  1  . SUMAtriptan (IMITREX) 100 MG tablet TAKE 1 TABLET BY MOUTH 2 TIMES DAILY AS NEEDED. REPEAT IN 2 HOURS IF HEADACHE PERSISTS.  10 tablet  3  . topiramate (TOPAMAX) 100 MG tablet One tablet twice a day  180 tablet  3  . trimethoprim (TRIMPEX) 100 MG tablet Take 100 mg by mouth at bedtime.      Marland Kitchen CINNAMON PO Take 1,000 mg by mouth daily.        No current facility-administered medications for this visit.    Past Medical History  Diagnosis Date  . Depression   . Anorexia   . Migraine   . GERD (gastroesophageal reflux disease)   . Seasonal allergies   .  Arrhythmia   . CAD (coronary artery disease)   . Frequent UTI   . Edema   . Vitamin D deficiency   . Restless leg syndrome   . Microscopic hematuria   . Hypothyroid     Past Surgical History  Procedure Laterality Date  . Cholecystectomy open    . Appendectomy    . Abdominal hysterectomy    . Back surgery  1997    herniated disc repair  . Cystoscopy      neg    ROS:  As stated in the HPI and negative for all other systems.  PHYSICAL EXAM BP 100/60  Pulse 49  Ht 5\' 8"  (1.727 m)  Wt 201 lb (91.173 kg)  BMI 30.57 kg/m2 GENERAL:  Well appearing HEENT:  Pupils equal round and reactive, fundi not visualized, oral mucosa unremarkable NECK:  No jugular venous distention, waveform within normal limits, carotid upstroke brisk and symmetric, no bruits, no thyromegaly LYMPHATICS:  No cervical, inguinal adenopathy LUNGS:  Clear to auscultation bilaterally BACK:  No CVA tenderness CHEST:  Unremarkable HEART:  PMI not displaced or sustained,S1 and  S2 within normal limits, no S3, no S4, no clicks, no rubs, no murmurs ABD:  Flat, positive bowel sounds normal in frequency in pitch, no bruits, no rebound, no guarding, no midline pulsatile mass, no hepatomegaly, no splenomegaly EXT:  2 plus pulses throughout, no edema, no cyanosis no clubbing SKIN:  No rashes no nodules NEURO:  Cranial nerves II through XII grossly intact, motor grossly intact throughout PSYCH:  Cognitively intact, oriented to person place and time  EKG: sinus bradycardia, rate 49, leftward axis, no acute ST-T wave changes.  09/08/2013  ASSESSMENT AND PLAN  Chest pain - The patient's chest pain is somewhat atypical. It does not seem to be unchanged from previous.  At this time I do not think that further testing is indicated.  However, we had a long discussion about this and she knows that she will need to let me know if she has any worsening in her symptoms or change in her symptom complex.    Palpitations - No further work up this is indicated.  No change in meds is indicated.    Dyslipidemia - I will check a lipid profile.    Bradycardia - This is related to her beta blocker but she's not symptomatic from this and needs the beta blocker for her migraines. No change in therapy is indicated.

## 2013-09-08 NOTE — Patient Instructions (Signed)
Your physician recommends that you schedule a follow-up appointment in: one year with Dr. Percival Spanish  We have ordered some labs for you to get done ; be sure your fasting before you get your blood drawn

## 2013-09-13 DIAGNOSIS — E782 Mixed hyperlipidemia: Secondary | ICD-10-CM | POA: Diagnosis not present

## 2013-09-13 LAB — LIPID PANEL
CHOLESTEROL: 118 mg/dL (ref 0–200)
HDL: 37 mg/dL — ABNORMAL LOW (ref >39)
LDL Cholesterol: 58 mg/dL (ref 0–99)
Total CHOL/HDL Ratio: 3.2 Ratio
Triglycerides: 116 mg/dL (ref <150)
VLDL: 23 mg/dL (ref 0–40)

## 2013-09-15 ENCOUNTER — Encounter: Payer: Self-pay | Admitting: Neurology

## 2013-09-15 ENCOUNTER — Ambulatory Visit (INDEPENDENT_AMBULATORY_CARE_PROVIDER_SITE_OTHER): Payer: Medicare Other | Admitting: Neurology

## 2013-09-15 VITALS — BP 103/70 | HR 56 | Wt 205.0 lb

## 2013-09-15 DIAGNOSIS — G43009 Migraine without aura, not intractable, without status migrainosus: Secondary | ICD-10-CM

## 2013-09-15 DIAGNOSIS — I2510 Atherosclerotic heart disease of native coronary artery without angina pectoris: Secondary | ICD-10-CM | POA: Diagnosis not present

## 2013-09-15 MED ORDER — VENLAFAXINE HCL ER 37.5 MG PO CP24: ORAL_CAPSULE | ORAL | Status: DC

## 2013-09-15 MED ORDER — PROPRANOLOL HCL ER 60 MG PO CP24: ORAL_CAPSULE | ORAL | Status: DC

## 2013-09-15 NOTE — Progress Notes (Signed)
Reason for visit: Migraine headache  ANA WOODROOF is an 58 y.o. female  History of present illness:  Ms. Seminara is a 58 year old right-handed white female with a history of migraine headache. The patient has run out of her Topamax when last seen, and her headaches have become daily. She is now back on 100 mg twice daily of Topamax, with some improvement of the headaches, but she is still having 2 or 3 headaches a week. The patient is also felt somewhat low on energy, not depressed, but feeling fatigued. The patient was noted to have bradycardia when she saw her cardiologist, with a heart rate around 44. The patient is on Inderal taking 120 mg LA tablet daily. The patient has been on this medication for quite a number of years. The patient takes Imitrex if needed for her migraine. She returns to this office for an evaluation.  Past Medical History  Diagnosis Date  . Depression   . Anorexia   . Migraine   . GERD (gastroesophageal reflux disease)   . Seasonal allergies   . Arrhythmia   . CAD (coronary artery disease)   . Frequent UTI   . Edema   . Vitamin D deficiency   . Restless leg syndrome   . Microscopic hematuria   . Hypothyroid     Past Surgical History  Procedure Laterality Date  . Cholecystectomy open    . Appendectomy    . Abdominal hysterectomy    . Back surgery  1997    herniated disc repair  . Cystoscopy      neg    Family History  Problem Relation Age of Onset  . Coronary artery disease Mother   . Thyroid disease Mother   . Alzheimer's disease Mother   . Diabetes Mother   . Heart disease Mother   . Cancer Brother     melnoma  . Cancer Paternal Aunt     ovarian  . Cancer Paternal Uncle     bone  . Cancer Maternal Grandmother     pancreatic  . Heart disease Maternal Grandfather     Social history:  reports that she has quit smoking. She started smoking about 41 years ago. She has never used smokeless tobacco. She reports that she drinks alcohol.  She reports that she does not use illicit drugs.    Allergies  Allergen Reactions  . Iohexol      Desc: throat selling resp distress hives.premedicate pt.entered 07/06/04, bsw.     Medications:  Current Outpatient Prescriptions on File Prior to Visit  Medication Sig Dispense Refill  . aspirin 325 MG tablet Take 325 mg by mouth daily.       . cephALEXin (KEFLEX) 250 MG capsule Take 250 mg by mouth daily.        Marland Kitchen CINNAMON PO Take 1,000 mg by mouth daily.       . Coenzyme Q10 (CO Q-10) 200 MG CAPS Take 1 capsule by mouth daily.        . fish oil-omega-3 fatty acids 1000 MG capsule Take 1.2 g by mouth 2 (two) times daily.      . furosemide (LASIX) 20 MG tablet Take 1 tablet (20 mg total) by mouth daily.  90 tablet  1  . Ginkgo Biloba 120 MG CAPS Take 1 capsule by mouth daily.        Marland Kitchen levothyroxine (SYNTHROID, LEVOTHROID) 112 MCG tablet Take 112 mcg by mouth daily.      . Multiple  Vitamin (MULTIVITAMIN) capsule Take 1 capsule by mouth daily.      . nitroGLYCERIN (NITROSTAT) 0.4 MG SL tablet Place 0.4 mg under the tongue every 5 (five) minutes as needed. Pain      . pantoprazole (PROTONIX) 40 MG tablet Take 1 tablet (40 mg total) by mouth 2 (two) times daily.  60 tablet  6  . potassium chloride SA (KLOR-CON M20) 20 MEQ tablet Take 2 tablets in am and 2 tablets in pm and 1/2 tablet at bedtime. * needs appointment for fasting labs, and with Dr for more refills.  135 tablet  0  . ranitidine (ZANTAC) 300 MG tablet Take 1 tablet (300 mg total) by mouth 2 (two) times daily.  60 tablet  3  . simvastatin (ZOCOR) 40 MG tablet TAKE 1 TABLET BY MOUTH AT BEDTIME  30 tablet  1  . SUMAtriptan (IMITREX) 100 MG tablet TAKE 1 TABLET BY MOUTH 2 TIMES DAILY AS NEEDED. REPEAT IN 2 HOURS IF HEADACHE PERSISTS.  10 tablet  3  . topiramate (TOPAMAX) 100 MG tablet One tablet twice a day  180 tablet  3  . trimethoprim (TRIMPEX) 100 MG tablet Take 100 mg by mouth at bedtime.       No current facility-administered  medications on file prior to visit.    ROS:  Out of a complete 14 system review of symptoms, the patient complains only of the following symptoms, and all other reviewed systems are negative.  Loss of vision Cold intolerance Frequent waking Joint pain, back pain, muscle cramps Headache  Blood pressure 103/70, pulse 56, weight 205 lb (92.987 kg).  Physical Exam  General: The patient is alert and cooperative at the time of the examination. The patient is moderately obese.  Neuromuscular: Range of movement of the cervical spine is full.  Skin: No significant peripheral edema is noted.   Neurologic Exam  Mental status: The patient is oriented x 3.  Cranial nerves: Facial symmetry is present. Speech is normal, no aphasia or dysarthria is noted. Extraocular movements are full. Visual fields are full.  Motor: The patient has good strength in all 4 extremities.  Sensory examination: Soft touch sensation is symmetric on the face, arms, and legs.  Coordination: The patient has good finger-nose-finger and heel-to-shin bilaterally.  Gait and station: The patient has a normal gait. Tandem gait is normal. Romberg is negative. No drift is seen.  Reflexes: Deep tendon reflexes are symmetric.   Assessment/Plan:  One. Migraine headache  The patient is having side effects on the beta blocker with fatigue. The patient will be reduced to a 60 mg daily dose of Inderal for 3 weeks, and then she will discontinue the medication. The patient will be started on Effexor taking 37.5 mg daily for 2 weeks, and then go to 75 mg daily. The patient will followup through this office in about 3 months. The patient will call our office if she is not doing well with this medication adjustment.  Jill Alexanders MD 09/16/2013 4:32 PM  [facility] [address] [place], [place] [address]  Phone [phone] Fax [phone]

## 2013-09-15 NOTE — Patient Instructions (Signed)

## 2013-09-27 ENCOUNTER — Other Ambulatory Visit: Payer: Self-pay | Admitting: Family Medicine

## 2013-09-27 NOTE — Telephone Encounter (Signed)
Please schedule 30 min f/u for OCt and refill until then thanks

## 2013-09-27 NOTE — Telephone Encounter (Signed)
Electronic refill request, no recent/future appt., please advise  

## 2013-09-28 ENCOUNTER — Other Ambulatory Visit: Payer: Self-pay | Admitting: *Deleted

## 2013-09-28 MED ORDER — SIMVASTATIN 40 MG PO TABS: ORAL_TABLET | ORAL | Status: DC

## 2013-09-28 NOTE — Telephone Encounter (Signed)
Rx refill sent to patient pharmacy   

## 2013-09-28 NOTE — Telephone Encounter (Signed)
Left voicemail requesting pt to call office 

## 2013-09-29 NOTE — Telephone Encounter (Signed)
Left 2nd voicemail requesting pt to call office back 

## 2013-10-04 ENCOUNTER — Other Ambulatory Visit: Payer: Self-pay | Admitting: Neurology

## 2013-10-04 DIAGNOSIS — F331 Major depressive disorder, recurrent, moderate: Secondary | ICD-10-CM | POA: Diagnosis not present

## 2013-10-04 NOTE — Telephone Encounter (Signed)
done

## 2013-10-05 ENCOUNTER — Other Ambulatory Visit: Payer: Self-pay

## 2013-10-05 MED ORDER — SIMVASTATIN 40 MG PO TABS: ORAL_TABLET | ORAL | Status: DC

## 2013-10-07 ENCOUNTER — Other Ambulatory Visit: Payer: Self-pay | Admitting: Family Medicine

## 2013-10-09 ENCOUNTER — Other Ambulatory Visit: Payer: Self-pay | Admitting: Neurology

## 2013-10-18 ENCOUNTER — Other Ambulatory Visit: Payer: Self-pay | Admitting: *Deleted

## 2013-10-18 MED ORDER — FUROSEMIDE 20 MG PO TABS: 20.0000 mg | ORAL_TABLET | Freq: Every day | ORAL | Status: DC

## 2013-11-01 DIAGNOSIS — F331 Major depressive disorder, recurrent, moderate: Secondary | ICD-10-CM | POA: Diagnosis not present

## 2013-11-07 ENCOUNTER — Encounter: Payer: Self-pay | Admitting: Internal Medicine

## 2013-11-15 ENCOUNTER — Ambulatory Visit (INDEPENDENT_AMBULATORY_CARE_PROVIDER_SITE_OTHER): Payer: Medicare Other | Admitting: Adult Health

## 2013-11-15 ENCOUNTER — Encounter: Payer: Self-pay | Admitting: Adult Health

## 2013-11-15 VITALS — BP 100/63 | HR 69 | Ht 68.0 in | Wt 199.8 lb

## 2013-11-15 DIAGNOSIS — Z5181 Encounter for therapeutic drug level monitoring: Secondary | ICD-10-CM

## 2013-11-15 DIAGNOSIS — I2510 Atherosclerotic heart disease of native coronary artery without angina pectoris: Secondary | ICD-10-CM

## 2013-11-15 DIAGNOSIS — G43119 Migraine with aura, intractable, without status migrainosus: Secondary | ICD-10-CM

## 2013-11-15 MED ORDER — TOPIRAMATE 50 MG PO TABS: ORAL_TABLET | ORAL | Status: DC

## 2013-11-15 NOTE — Progress Notes (Addendum)
PATIENT: [name] [name] DOB: 1955/05/16  REASON FOR VISIT: follow up HISTORY FROM: patient  HISTORY OF PRESENT ILLNESS: Ms. Corona is a 58 year old female with a history of migraine. She returns today for follow-up. She was started on Effexor and reports that she could not tolerate the medication. She states that it made her feel like a "zombie."  She reports that has approximately 4 headaches per week but this week she has gone four days without a headache. She has continued using the Imitrex and Topamax. She states that since she stopped the inderal and she has noted a drastic difference in her fatigue. During her headaches she experience nausea and sometimes vomiting. + photophobia and phonophobia. Laying down makes her head hurt worse. She will have numbness on the left side of the scalp in the temporal area. She states that pain usually in the left frontal or left occipital area. Her headaches can last 5-6 hours or up to one day. She will sometimes get an aura with her migraines. She will have vision changes- states that the lighting changes. Describes it as the light in the room is dim. She has tried nortriptyline and it was unsuccessful.   HISTORY 09/15/13(CW): [name] is a 58 year old right-handed white female with a history of migraine headache. The patient has run out of her Topamax when last seen, and her headaches have become daily. She is now back on 100 mg twice daily of Topamax, with some improvement of the headaches, but she is still having 2 or 3 headaches a week. The patient is also felt somewhat low on energy, not depressed, but feeling fatigued. The patient was noted to have bradycardia when she saw her cardiologist, with a heart rate around 44. The patient is on Inderal taking 120 mg LA tablet daily. The patient has been on this medication for quite a number of years. The patient takes Imitrex if needed for her migraine. She returns to this office for an evaluation.   REVIEW  OF SYSTEMS: Full 14 system review of systems performed and notable only for:  Constitutional: N/A  Eyes: light sensitivity, loss of vision, eye pain Ear/Nose/Throat: hearing loss Skin: N/A  Cardiovascular: palpitations Respiratory: N/A  Gastrointestinal: nausea Genitourinary: N/A Hematology/Lymphatic: N/A  Endocrine: cold intolerence Musculoskeletal: back pain   Allergy/Immunology: N/A  Neurological: headache Psychiatric: N/A Sleep: snoring, sleep talking   ALLERGIES: Allergies  Allergen Reactions  . Iohexol      Desc: throat selling resp distress hives.premedicate pt.entered 07/06/04, bsw.     HOME MEDICATIONS: Outpatient Prescriptions Prior to Visit  Medication Sig Dispense Refill  . aspirin 325 MG tablet Take 325 mg by mouth daily.       . cephALEXin (KEFLEX) 250 MG capsule Take 250 mg by mouth daily.        Marland Kitchen CINNAMON PO Take 1,000 mg by mouth daily.       . Coenzyme Q10 (CO Q-10) 200 MG CAPS Take 1 capsule by mouth daily.        . fish oil-omega-3 fatty acids 1000 MG capsule Take 1.2 g by mouth 2 (two) times daily.      . furosemide (LASIX) 20 MG tablet Take 1 tablet (20 mg total) by mouth daily.  90 tablet  0  . Ginkgo Biloba 120 MG CAPS Take 1 capsule by mouth daily.        Marland Kitchen KLOR-CON M20 20 MEQ tablet TAKE 2 TABLETS BY MOUTH TWICE A DAY AND 1/2  TABLET AT BEDTIME  135 tablet  0  . levothyroxine (SYNTHROID, LEVOTHROID) 112 MCG tablet Take 112 mcg by mouth daily.      . Multiple Vitamin (MULTIVITAMIN) capsule Take 1 capsule by mouth daily.      . nitroGLYCERIN (NITROSTAT) 0.4 MG SL tablet Place 0.4 mg under the tongue every 5 (five) minutes as needed. Pain      . pantoprazole (PROTONIX) 40 MG tablet Take 1 tablet (40 mg total) by mouth 2 (two) times daily.  60 tablet  6  . ranitidine (ZANTAC) 300 MG tablet Take 1 tablet (300 mg total) by mouth 2 (two) times daily.  60 tablet  3  . simvastatin (ZOCOR) 40 MG tablet TAKE 1 TABLET BY MOUTH AT BEDTIME  30 tablet  9  .  SUMAtriptan (IMITREX) 100 MG tablet TAKE 1 TABLET BY MOUTH 2 TIMES DAILY AS NEEDED. REPEAT IN 2 HOURS IF HEADACHE PERSISTS.  10 tablet  6  . topiramate (TOPAMAX) 100 MG tablet One tablet twice a day  180 tablet  3  . trimethoprim (TRIMPEX) 100 MG tablet Take 100 mg by mouth at bedtime.      . propranolol ER (INDERAL LA) 120 MG 24 hr capsule TAKE ONE CAPSULE BY MOUTH EVERY DAY AT BEDTIME  30 capsule  1  . SUMAtriptan (IMITREX) 100 MG tablet TAKE 1 TABLET BY MOUTH 2 TIMES DAILY AS NEEDED. REPEAT IN 2 HOURS IF HEADACHE PERSISTS.  10 tablet  6  . venlafaxine XR (EFFEXOR XR) 37.5 MG 24 hr capsule One tablet daily for 2 weeks, then take 2 tablets daily  60 capsule  5   No facility-administered medications prior to visit.    PAST MEDICAL HISTORY: Past Medical History  Diagnosis Date  . Depression   . Anorexia   . Migraine   . GERD (gastroesophageal reflux disease)   . Seasonal allergies   . Arrhythmia   . CAD (coronary artery disease)   . Frequent UTI   . Edema   . Vitamin D deficiency   . Restless leg syndrome   . Microscopic hematuria   . Hypothyroid     PAST SURGICAL HISTORY: Past Surgical History  Procedure Laterality Date  . Cholecystectomy open    . Appendectomy    . Abdominal hysterectomy    . Back surgery  1997    herniated disc repair  . Cystoscopy      neg    FAMILY HISTORY: Family History  Problem Relation Age of Onset  . Coronary artery disease Mother   . Thyroid disease Mother   . Alzheimer's disease Mother   . Diabetes Mother   . Heart disease Mother   . Cancer Brother     melnoma  . Cancer Paternal Aunt     ovarian  . Cancer Paternal Uncle     bone  . Cancer Maternal Grandmother     pancreatic  . Heart disease Maternal Grandfather     SOCIAL HISTORY: History   Social History  . Marital Status: Widowed    Spouse Name: N/A    Number of Children: N/A  . Years of Education: N/A   Occupational History  . not working    Social History Main  Topics  . Smoking status: Former Smoker    Start date: 10/14/1971  . Smokeless tobacco: Never Used  . Alcohol Use: Yes     Comment: socially  . Drug Use: No  . Sexual Activity: Not on file   Other Topics  Concern  . Not on file   Social History Narrative   Does not get any regular exercise      Doctors   -uro-Ottlein   -cardio--hochrein   -Neurology--Dr. Jannifer Franklin   -allergies--Dr. Carmelina Peal   Gyn past--Dr. Warnell Forester   Couselor--Dr. Mitchum   Endo-- Dr. Chalmers Cater      Lives with son who is 62 years old      PHYSICAL EXAM  Filed Vitals:   11/15/13 1529  BP: 100/63  Pulse: 69  Height: 5\' 8"  (1.727 m)  Weight: 199 lb 12.8 oz (90.629 kg)   Body mass index is 30.39 kg/(m^2).  Generalized: Well developed, in no acute distress   Neurological examination  Mentation: Alert oriented to time, place, history taking. Follows all commands speech and language fluent Cranial nerve II-XII: Pupils were equal round reactive to light. Extraocular movements were full, visual field were full on confrontational test. Facial sensation and strength were normal.Uvula tongue midline. Head turning and shoulder shrug  were normal and symmetric. Motor: The motor testing reveals 5 over 5 strength of all 4 extremities. Good symmetric motor tone is noted throughout.  Sensory: Sensory testing is intact to soft touch on all 4 extremities. No evidence of extinction is noted.  Coordination: Cerebellar testing reveals good finger-nose-finger and heel-to-shin bilaterally.  Gait and station: Gait is normal. Tandem gait is normal. Romberg is negative. No drift is seen.  Reflexes: Deep tendon reflexes are symmetric and normal bilaterally.   DIAGNOSTIC DATA (LABS, IMAGING, TESTING) - I reviewed patient records, labs, notes, testing and imaging myself where available.  Lab Results  Component Value Date   WBC 7.0 12/10/2011   HGB 12.6 12/10/2011   HCT 37.8 12/10/2011   MCV 91.0 12/10/2011   PLT 196.0 12/10/2011        Component Value Date/Time   NA 142 08/20/2012 1724   K 4.5 08/20/2012 1724   CL 110 08/20/2012 1724   CO2 22 08/20/2012 1724   GLUCOSE 103* 08/20/2012 1724   BUN 24* 08/20/2012 1724   CREATININE 1.43* 08/20/2012 1724   CREATININE 1.3* 12/10/2011 1557   CALCIUM 10.3 08/20/2012 1724   PROT 7.4 08/20/2012 1724   ALBUMIN 4.5 08/20/2012 1724   AST 24 08/20/2012 1724   ALT 27 08/20/2012 1724   ALKPHOS 50 08/20/2012 1724   BILITOT 1.3* 08/20/2012 1724   GFRNONAA 44* 06/04/2011 1750   GFRAA 51* 06/04/2011 1750   Lab Results  Component Value Date   CHOL 118 09/13/2013   HDL 37* 09/13/2013   LDLCALC 58 09/13/2013   TRIG 116 09/13/2013   CHOLHDL 3.2 09/13/2013   Lab Results  Component Value Date   HGBA1C 5.4 08/20/2012   No results found for this basename: [id number]   Lab Results  Component Value Date   TSH 0.54 12/10/2011      ASSESSMENT AND PLAN 58 y.o. year old female  has a past medical history of Depression; Anorexia; Migraine; GERD (gastroesophageal reflux disease); Seasonal allergies; Arrhythmia; CAD (coronary artery disease); Frequent UTI; Edema; Vitamin D deficiency; Restless leg syndrome; Microscopic hematuria; and Hypothyroid. here with:  1. Migraines  Patient has failed several therapies. She continues to have migraines 4 times a week- 16 in a month. She has failed Topamax, nortriptyline, inderal, and Effexor. She does qualify for Botox and is interested in this treatment. I will start the approval process. In the meantime we will try to the patient on Depakote. I will check a liver function panel if enzymes  are in normal range I will start Depakote ER 250 mg 1 tablet daily for 1 week then 2 tablets daily thereafter. I will begin weaning her off the Topamax. I will change her to the 50 mg tablets, she will take 1 tablet by mouth in the morning and two tablets at night for 1 week. Then decrease to 1 tablet twice a day for 1 week, then 1 tablet daily for 1 week then stop. She will  follow-up in 2 months or sooner if needed.    Ward Givens, MSN, NP-C 11/15/2013, 3:31 PM [facility] [address] [place], [place] [address] [phone]  Note: This document was prepared with digital dictation and possible smart phrase technology. Any transcriptional errors that result from this process are unintentional.

## 2013-11-15 NOTE — Patient Instructions (Addendum)
Wean off Topamax: Take 1 tablet by mouth in the morning and two tablets at night for 1 week. Then decrease to 1 tablet twice a day for 1 week, then 1 tablet daily for 1 week then stop.

## 2013-11-15 NOTE — Progress Notes (Signed)
I have read the note, and I agree with the clinical assessment and plan.  Cydney Alvarenga KEITH   

## 2013-11-16 ENCOUNTER — Other Ambulatory Visit (INDEPENDENT_AMBULATORY_CARE_PROVIDER_SITE_OTHER): Payer: Self-pay

## 2013-11-16 DIAGNOSIS — Z5181 Encounter for therapeutic drug level monitoring: Secondary | ICD-10-CM | POA: Diagnosis not present

## 2013-11-16 DIAGNOSIS — Z0289 Encounter for other administrative examinations: Secondary | ICD-10-CM

## 2013-11-16 MED ORDER — TOPIRAMATE 50 MG PO TABS: ORAL_TABLET | ORAL | Status: DC

## 2013-11-16 NOTE — Addendum Note (Signed)
Addended by: Trudie Buckler on: 11/16/2013 03:24 PM   Modules accepted: Orders

## 2013-11-17 ENCOUNTER — Telehealth: Payer: Self-pay | Admitting: Adult Health

## 2013-11-17 LAB — HEPATIC FUNCTION PANEL
ALBUMIN: 4.6 g/dL (ref 3.5–5.5)
ALK PHOS: 54 IU/L (ref 39–117)
ALT: 28 IU/L (ref 0–32)
AST: 26 IU/L (ref 0–40)
BILIRUBIN DIRECT: 0.29 mg/dL (ref 0.00–0.40)
TOTAL PROTEIN: 7.3 g/dL (ref 6.0–8.5)
Total Bilirubin: 1.3 mg/dL — ABNORMAL HIGH (ref 0.0–1.2)

## 2013-11-17 MED ORDER — DIVALPROEX SODIUM 250 MG PO DR TAB: DELAYED_RELEASE_TABLET | ORAL | Status: DC

## 2013-11-17 NOTE — Telephone Encounter (Signed)
I called the patient and left a message on her Cell phone. Her liver function is normal. I will start Depakote 250 mg daily for one week then increase to 1 tablet BID thereafter. I encouraged her to call the office if she had any questions.

## 2013-11-18 ENCOUNTER — Encounter: Payer: Self-pay | Admitting: Family Medicine

## 2013-11-18 ENCOUNTER — Ambulatory Visit (INDEPENDENT_AMBULATORY_CARE_PROVIDER_SITE_OTHER): Payer: Medicare Other | Admitting: Family Medicine

## 2013-11-18 ENCOUNTER — Telehealth: Payer: Self-pay | Admitting: Adult Health

## 2013-11-18 VITALS — BP 104/56 | HR 64 | Temp 98.5°F | Ht 68.0 in | Wt 199.5 lb

## 2013-11-18 DIAGNOSIS — K219 Gastro-esophageal reflux disease without esophagitis: Secondary | ICD-10-CM | POA: Diagnosis not present

## 2013-11-18 DIAGNOSIS — G4710 Hypersomnia, unspecified: Secondary | ICD-10-CM | POA: Diagnosis not present

## 2013-11-18 DIAGNOSIS — E785 Hyperlipidemia, unspecified: Secondary | ICD-10-CM

## 2013-11-18 DIAGNOSIS — R400 Somnolence: Secondary | ICD-10-CM

## 2013-11-18 DIAGNOSIS — N289 Disorder of kidney and ureter, unspecified: Secondary | ICD-10-CM | POA: Diagnosis not present

## 2013-11-18 MED ORDER — PANTOPRAZOLE SODIUM 40 MG PO TBEC: 40.0000 mg | DELAYED_RELEASE_TABLET | Freq: Two times a day (BID) | ORAL | Status: DC

## 2013-11-18 MED ORDER — POTASSIUM CHLORIDE CRYS ER 20 MEQ PO TBCR: EXTENDED_RELEASE_TABLET | ORAL | Status: DC

## 2013-11-18 MED ORDER — FUROSEMIDE 20 MG PO TABS: 20.0000 mg | ORAL_TABLET | Freq: Every day | ORAL | Status: DC

## 2013-11-18 MED ORDER — RANITIDINE HCL 300 MG PO TABS: 300.0000 mg | ORAL_TABLET | Freq: Two times a day (BID) | ORAL | Status: DC

## 2013-11-18 NOTE — Telephone Encounter (Signed)
I called the patient on her cell phone and home phone. I left a message. I want to ensure that she got the message about starting Depakote and understood the instructions.

## 2013-11-18 NOTE — Assessment & Plan Note (Signed)
On zocor  Checked recently by cardiology-well controlled Exercise should help her HDL

## 2013-11-18 NOTE — Assessment & Plan Note (Signed)
Hx of chronic utis  occ takes trimethoprim (but tries keflex first -if no yeast infection) Continue urol f/u for this  Enc water intake Enc avoidance of nsaid Lab today

## 2013-11-18 NOTE — Patient Instructions (Signed)
Labs today - for chemistries and kidney function  Keep drinking water for kidney health Make your own mammogram appt I'm glad you are doing better!

## 2013-11-18 NOTE — Assessment & Plan Note (Signed)
Doing better on current medicines ppi and H2 blocker as long as she eats properly  Has failed doses any lower than she is on currently  Enc wt loss

## 2013-11-18 NOTE — Progress Notes (Signed)
Subjective:    Patient ID: [name] [name], female    DOB: 05-14-1955, 58 y.o.   MRN: [id number]  HPI Here for follow up of chronic medical problems   Dr Jannifer Franklin took her off of beta blocker -and her energy level went way up -- is more active and motivated  She feels much much better ! Getting things done   Headaches are still terrible however - still working with neurology- had a visit Tuesday  Thinking about botox - she is quite afraid of that  Tapering her off topamax Will try on depakote (has not started it yet)- will soon    She still sleeps well   Wt is stable with bmi of 30   GERD - doing "ok" unless she eats the wrong thing  Avoiding certain foods and keeping track of this   Thyroid has stayed about the same - she is still seeing endocrinology   Still gets urinary tract infections  Takes keflex some of the time and trimethoprim some of the time  Gets some yeast infections   Just had her cholesterol checked  Lab Results  Component Value Date   CHOL 118 09/13/2013   HDL 37* 09/13/2013   LDLCALC 58 09/13/2013   TRIG 116 09/13/2013   CHOLHDL 3.2 09/13/2013   she is more active now  Is back on the treadmill     Chemistry      Component Value Date/Time   NA 142 08/20/2012 1724   K 4.5 08/20/2012 1724   CL 110 08/20/2012 1724   CO2 22 08/20/2012 1724   BUN 24* 08/20/2012 1724   CREATININE 1.43* 08/20/2012 1724   CREATININE 1.3* 12/10/2011 1557      Component Value Date/Time   CALCIUM 10.3 08/20/2012 1724   ALKPHOS 54 11/16/2013 1514   AST 26 11/16/2013 1514   ALT 28 11/16/2013 1514   BILITOT 1.3* 11/16/2013 1514     due for re check of chemistries and renal function    Review of Systems Review of Systems  Constitutional: Negative for fever, appetite change,  and unexpected weight change. Pos for fatigue that is much improved   Eyes: Negative for pain and visual disturbance.  Respiratory: Negative for cough and shortness of breath.   Cardiovascular: Negative  for cp or palpitations    Gastrointestinal: Negative for nausea, diarrhea and constipation.  Genitourinary: pos for baseline  for urgency and frequency. neg for hematuria  Skin: Negative for pallor or rash   Neurological: Negative for weakness, light-headedness, numbness and pos for persistant headaches .  Hematological: Negative for adenopathy. Does not bruise/bleed easily.  Psychiatric/Behavioral: pos for depression and anxiety that are stable .         Objective:   Physical Exam  Constitutional: She appears well-developed and well-nourished. No distress.  obese and well appearing   HENT:  Head: Normocephalic and atraumatic.  Right Ear: External ear normal.  Left Ear: External ear normal.  Nose: Nose normal.  Mouth/Throat: Oropharynx is clear and moist.  Eyes: Conjunctivae and EOM are normal. Pupils are equal, round, and reactive to light. Right eye exhibits no discharge. Left eye exhibits no discharge. No scleral icterus.  Neck: Normal range of motion. Neck supple. No JVD present. No thyromegaly present.  Cardiovascular: Normal rate, regular rhythm, normal heart sounds and intact distal pulses.  Exam reveals no gallop.   Pulmonary/Chest: Effort normal and breath sounds normal. No respiratory distress. She has no wheezes. She has no rales.  Abdominal: Soft. Bowel sounds are normal. She exhibits no distension and no mass. There is no tenderness.  Mild LQ tenderness bilat , no rebound or guarding  Musculoskeletal: She exhibits no edema and no tenderness.  Lymphadenopathy:    She has no cervical adenopathy.  Neurological: She is alert. She has normal reflexes. No cranial nerve deficit. She exhibits normal muscle tone. Coordination normal.  Skin: Skin is warm and dry. No rash noted. No erythema. No pallor.  Psychiatric: Her speech is normal and behavior is normal. Thought content normal. Her affect is not labile and not inappropriate. She does not exhibit a depressed mood.  Pt's affect  is brighter than usual today          Assessment & Plan:   Problem List Items Addressed This Visit     Digestive   GERD - Primary     Doing better on current medicines ppi and H2 blocker as long as she eats properly  Has failed doses any lower than she is on currently  Enc wt loss     Relevant Medications      pantoprazole (PROTONIX) EC tablet      ranitidine (ZANTAC) tablet     Genitourinary   Renal insufficiency     Hx of chronic utis  occ takes trimethoprim (but tries keflex first -if no yeast infection) Continue urol f/u for this  Enc water intake Enc avoidance of nsaid Lab today    Relevant Orders      Comprehensive metabolic panel (Completed)     Other   Hyperlipidemia     On zocor  Checked recently by cardiology-well controlled Exercise should help her HDL    Relevant Medications      furosemide (LASIX) tablet   Somnolence, daytime     Much improvement with elimination of beta blocker  She is thankful for this

## 2013-11-18 NOTE — Progress Notes (Signed)
Pre visit review using our clinic review tool, if applicable. No additional management support is needed unless otherwise documented below in the visit note. 

## 2013-11-19 LAB — COMPREHENSIVE METABOLIC PANEL
ALBUMIN: 4.1 g/dL (ref 3.5–5.2)
ALT: 22 U/L (ref 0–35)
AST: 20 U/L (ref 0–37)
Alkaline Phosphatase: 47 U/L (ref 39–117)
BUN: 23 mg/dL (ref 6–23)
CALCIUM: 9.4 mg/dL (ref 8.4–10.5)
CHLORIDE: 110 meq/L (ref 96–112)
CO2: 22 meq/L (ref 19–32)
CREATININE: 1.35 mg/dL — AB (ref 0.50–1.10)
Glucose, Bld: 116 mg/dL — ABNORMAL HIGH (ref 70–99)
POTASSIUM: 4.2 meq/L (ref 3.5–5.3)
Sodium: 142 mEq/L (ref 135–145)
Total Bilirubin: 1.4 mg/dL — ABNORMAL HIGH (ref 0.2–1.2)
Total Protein: 6.6 g/dL (ref 6.0–8.3)

## 2013-11-20 NOTE — Assessment & Plan Note (Signed)
Much improvement with elimination of beta blocker  She is thankful for this

## 2013-11-21 ENCOUNTER — Other Ambulatory Visit: Payer: Self-pay | Admitting: Family Medicine

## 2013-11-21 DIAGNOSIS — Z1231 Encounter for screening mammogram for malignant neoplasm of breast: Secondary | ICD-10-CM

## 2013-11-29 DIAGNOSIS — F331 Major depressive disorder, recurrent, moderate: Secondary | ICD-10-CM | POA: Diagnosis not present

## 2013-12-07 ENCOUNTER — Ambulatory Visit (HOSPITAL_COMMUNITY)
Admission: RE | Admit: 2013-12-07 | Discharge: 2013-12-07 | Disposition: A | Discharge status: Home / Self Care | Payer: Medicare Other | Source: Ambulatory Visit | Attending: Family Medicine | Admitting: Family Medicine

## 2013-12-07 DIAGNOSIS — Z1231 Encounter for screening mammogram for malignant neoplasm of breast: Secondary | ICD-10-CM

## 2013-12-07 DIAGNOSIS — R928 Other abnormal and inconclusive findings on diagnostic imaging of breast: Secondary | ICD-10-CM | POA: Diagnosis not present

## 2013-12-10 ENCOUNTER — Other Ambulatory Visit: Payer: Self-pay | Admitting: Family Medicine

## 2013-12-14 ENCOUNTER — Encounter: Payer: Self-pay | Admitting: Neurology

## 2013-12-14 ENCOUNTER — Ambulatory Visit (INDEPENDENT_AMBULATORY_CARE_PROVIDER_SITE_OTHER): Payer: Medicare Other | Admitting: Neurology

## 2013-12-14 ENCOUNTER — Telehealth: Payer: Self-pay | Admitting: Neurology

## 2013-12-14 ENCOUNTER — Other Ambulatory Visit: Payer: Self-pay | Admitting: Family Medicine

## 2013-12-14 DIAGNOSIS — G43009 Migraine without aura, not intractable, without status migrainosus: Secondary | ICD-10-CM

## 2013-12-14 DIAGNOSIS — R928 Other abnormal and inconclusive findings on diagnostic imaging of breast: Secondary | ICD-10-CM

## 2013-12-14 DIAGNOSIS — I2510 Atherosclerotic heart disease of native coronary artery without angina pectoris: Secondary | ICD-10-CM

## 2013-12-14 DIAGNOSIS — G43719 Chronic migraine without aura, intractable, without status migrainosus: Secondary | ICD-10-CM

## 2013-12-14 MED ORDER — ONABOTULINUMTOXINA 100 UNITS IJ SOLR
200.0000 [IU] | Freq: Once | INTRAMUSCULAR | Status: AC
  Administered 2013-12-14: 200 [IU] via INTRAMUSCULAR

## 2013-12-14 NOTE — Telephone Encounter (Signed)
This patient is using Imitrex frequent, apparently does have a history of coronary artery disease with prior stent placement. We will ask the patient to stop using Imitrex.

## 2013-12-14 NOTE — Progress Notes (Signed)
PATIENT: [name] [name] DOB: August 27, 1955   HISTORY OF PRESENT ILLNESS: [name] [name] is a 58 year old female with a history of migraine. She is referred by Jinny Blossom,  Dr. Jannifer Franklin for Botox injection as migraine prevention  She reported a history of migraine for many years,  Over the years, she has tried different preventive medications, Effexor, causing mental confusion, could not tolerate it, nortriptyline, no help  She is currently taking Topamax 100 mg twice a day, Inderal causing excessive fatigue, after stopping Enduron, her fatigue has much improved,  Her typical migraine are lateralized severe pounding headaches, with associated light noise sensitivity, nausea, vomiting, oftentimes,  Her headache is at her left side, with residual left the skull numbness, allodynia, sometimes she has oral with her migraine,  Flashing light in her visual field, blurry vision, before the onset of severe headaches,  Over the past 2 years, her headache almost becomes daily basis, mild to moderate pressure headache daily, 3-4 times each week,she would get more severe typical disability rating migraine, lasting half day to couple days,  Over the years, she has tried different triptan treatment, including Maxalt, Axert,currently taking Imitrex as needed, which does help her headaches  Unfortunately, she also carried a diagnosis CORONARY artery disease, reported history of stent placement in 2007,  tthis is her first Botox injection as migraine prevention, related question was answered,   REVIEW OF SYSTEMS: Full 14 system review of systems performed and notable only for:  As above    ALLERGIES: Allergies  Allergen Reactions  . Iohexol      Desc: throat selling resp distress hives.premedicate pt.entered 07/06/04, bsw.     HOME MEDICATIONS: Outpatient Prescriptions Prior to Visit  Medication Sig Dispense Refill  . aspirin 325 MG tablet Take 325 mg by mouth daily.     . cephALEXin (KEFLEX) 250 MG  capsule Take 250 mg by mouth daily.      Marland Kitchen CINNAMON PO Take 1,000 mg by mouth daily.     . Coenzyme Q10 (CO Q-10) 200 MG CAPS Take 1 capsule by mouth daily.      . divalproex (DEPAKOTE) 250 MG DR tablet Take 1 tablet by mouth daily for 1 week then increase to 1 tablet by mouth twice a day. 60 tablet 3  . fish oil-omega-3 fatty acids 1000 MG capsule Take 1.2 g by mouth 2 (two) times daily.    . furosemide (LASIX) 20 MG tablet Take 1 tablet (20 mg total) by mouth daily. 90 tablet 3  . Ginkgo Biloba 120 MG CAPS Take 1 capsule by mouth daily.      Marland Kitchen levothyroxine (SYNTHROID, LEVOTHROID) 112 MCG tablet Take 112 mcg by mouth daily.    . Multiple Vitamin (MULTIVITAMIN) capsule Take 1 capsule by mouth daily.    . nitroGLYCERIN (NITROSTAT) 0.4 MG SL tablet Place 0.4 mg under the tongue every 5 (five) minutes as needed. Pain    . pantoprazole (PROTONIX) 40 MG tablet Take 1 tablet (40 mg total) by mouth 2 (two) times daily. 60 tablet 11  . potassium chloride SA (KLOR-CON M20) 20 MEQ tablet TAKE 2 TABLETS BY MOUTH TWICE A DAY AND 1/2 TABLET AT BEDTIME 135 tablet 11  . propranolol ER (INDERAL LA) 120 MG 24 hr capsule TAKE ONE CAPSULE BY MOUTH EVERY DAY AT BEDTIME 30 capsule 5  . ranitidine (ZANTAC) 300 MG tablet Take 1 tablet (300 mg total) by mouth 2 (two) times daily. 60 tablet 11  . simvastatin (ZOCOR) 40  MG tablet TAKE 1 TABLET BY MOUTH AT BEDTIME 30 tablet 9  . SUMAtriptan (IMITREX) 100 MG tablet TAKE 1 TABLET BY MOUTH 2 TIMES DAILY AS NEEDED. REPEAT IN 2 HOURS IF HEADACHE PERSISTS. 10 tablet 6  . topiramate (TOPAMAX) 50 MG tablet Take 1 tablet  in the AM & 2 tablets in the PM for 1 week. Then to 1 tablet twice a day for 1 week, then 1 tablet daily for 1 week then stop. 50 tablet 0  . trimethoprim (TRIMPEX) 100 MG tablet Take 100 mg by mouth at bedtime.     No facility-administered medications prior to visit.    PAST MEDICAL HISTORY: Past Medical History  Diagnosis Date  . Depression   . Anorexia     . Migraine   . GERD (gastroesophageal reflux disease)   . Seasonal allergies   . Arrhythmia   . CAD (coronary artery disease)   . Frequent UTI   . Edema   . Vitamin D deficiency   . Restless leg syndrome   . Microscopic hematuria   . Hypothyroid     PAST SURGICAL HISTORY: Past Surgical History  Procedure Laterality Date  . Cholecystectomy open    . Appendectomy    . Abdominal hysterectomy    . Back surgery  1997    herniated disc repair  . Cystoscopy      neg    FAMILY HISTORY: Family History  Problem Relation Age of Onset  . Coronary artery disease Mother   . Thyroid disease Mother   . Alzheimer's disease Mother   . Diabetes Mother   . Heart disease Mother   . Cancer Brother     melnoma  . Cancer Paternal Aunt     ovarian  . Cancer Paternal Uncle     bone  . Cancer Maternal Grandmother     pancreatic  . Heart disease Maternal Grandfather     SOCIAL HISTORY: History   Social History  . Marital Status: Widowed    Spouse Name: N/A    Number of Children: N/A  . Years of Education: N/A   Occupational History  . not working    Social History Main Topics  . Smoking status: Former Smoker    Start date: 10/14/1971  . Smokeless tobacco: Never Used  . Alcohol Use: Yes     Comment: socially  . Drug Use: No  . Sexual Activity: Not on file   Other Topics Concern  . Not on file   Social History Narrative   Does not get any regular exercise      Doctors   -uro-Ottlein   -cardio--hochrein   -Neurology--Dr. Jannifer Franklin   -allergies--Dr. Carmelina Peal   Gyn past--Dr. Warnell Forester   Couselor--Dr. Mitchum   Endo-- Dr. Chalmers Cater      Lives with son who is 74 years old      PHYSICAL EXAM  Filed Vitals:   PHYSICAL EXAMINATOINS:  Generalized: In no acute distress  Neck: Supple, no carotid bruits   Cardiac: Regular rate rhythm  Pulmonary: Clear to auscultation bilaterally  Musculoskeletal: No deformity  Neurological examination  Mentation: Alert oriented to  time, place, history taking, and causual conversation  Cranial nerve II-XII: Pupils were equal round reactive to light extraocular movements were full, visual field were full on confrontational test.  Bilateral fundi were sharp  Facial sensation and strength were normal. hearing was intact to finger rubbing bilaterally. Uvula tongue midline.  head turning and shoulder shrug and were normal and symmetric.Tongue  protrusion into cheek strength was normal.  Motor: normal tone, bulk and strength.  Sensory: Intact to fine touch, pinprick, preserved vibratory sensation, and proprioception at toes.  Coordination: Normal finger to nose, heel-to-shin bilaterally there was no truncal ataxia  Gait: Rising up from seated position without assistance, normal stance, without trunk ataxia, moderate stride, good arm swing, smooth turning, able to perform tiptoe, and heel walking without difficulty.   Romberg signs: Negative  Deep tendon reflexes: Brachioradialis 2/2, biceps 2/2, triceps 2/2, patellar 2/2, Achilles 2/2, plantar responses were flexor bilaterally.     DIAGNOSTIC DATA (LABS, IMAGING, TESTING) - I reviewed patient records, labs, notes, testing and imaging myself where available.  Lab Results  Component Value Date   WBC 7.0 12/10/2011   HGB 12.6 12/10/2011   HCT 37.8 12/10/2011   MCV 91.0 12/10/2011   PLT 196.0 12/10/2011      Component Value Date/Time   NA 142 11/18/2013 1438   K 4.2 11/18/2013 1438   CL 110 11/18/2013 1438   CO2 22 11/18/2013 1438   GLUCOSE 116* 11/18/2013 1438   BUN 23 11/18/2013 1438   CREATININE 1.35* 11/18/2013 1438   CREATININE 1.3* 12/10/2011 1557   CALCIUM 9.4 11/18/2013 1438   PROT 6.6 11/18/2013 1438   PROT 7.3 11/16/2013 1514   ALBUMIN 4.1 11/18/2013 1438   AST 20 11/18/2013 1438   ALT 22 11/18/2013 1438   ALKPHOS 47 11/18/2013 1438   BILITOT 1.4* 11/18/2013 1438   GFRNONAA 44* 06/04/2011 1750   GFRAA 51* 06/04/2011 1750   Lab Results    Component Value Date   CHOL 118 09/13/2013   HDL 37* 09/13/2013   LDLCALC 58 09/13/2013   TRIG 116 09/13/2013   CHOLHDL 3.2 09/13/2013   Lab Results  Component Value Date   HGBA1C 5.4 08/20/2012   No results found for: [id number] Lab Results  Component Value Date   TSH 0.54 12/10/2011      ASSESSMENT AND PLAN 58 y.o. year old female  With past medical history of chronic migraine, tried and failed multiple preventative medications in the past, including Inderal, nortriptyline, currently taking Topamax, still has daily headaches,  More than 15 typical migraine each months, each headaches last about 6 hours or longer,also reported a history of coronary artery disease,Maxalt  works for headache, but with her history of coronary artery disease, triptan is no longer good choice  BOTOX injection was performed according to protocol by [employer]. 100 units of BOTOX was dissolved into 2 cc NS.    total of 155 units, discard 45 units.   Corrugator 2 sites, 10 units Procerus 1 site, 5 unit Frontalis 4 sites,  20 units, Temporalis 8 sites,  40 units  Occipitalis 6 sites, 30 units Cervical Paraspinal, 4 sites, 20 units Trapezius, 6 sites, 30 units  Patient tolerate the injection well. Will return for repeat injection in 3 months.

## 2013-12-29 ENCOUNTER — Ambulatory Visit
Admission: RE | Admit: 2013-12-29 | Discharge: 2013-12-29 | Disposition: A | Discharge status: Home / Self Care | Payer: Medicare Other | Source: Ambulatory Visit | Attending: Family Medicine | Admitting: Family Medicine

## 2013-12-29 ENCOUNTER — Other Ambulatory Visit: Payer: Self-pay | Admitting: Family Medicine

## 2013-12-29 DIAGNOSIS — R928 Other abnormal and inconclusive findings on diagnostic imaging of breast: Secondary | ICD-10-CM

## 2013-12-29 DIAGNOSIS — N6310 Unspecified lump in the right breast, unspecified quadrant: Secondary | ICD-10-CM

## 2013-12-29 DIAGNOSIS — C50811 Malignant neoplasm of overlapping sites of right female breast: Secondary | ICD-10-CM | POA: Diagnosis not present

## 2013-12-29 DIAGNOSIS — N63 Unspecified lump in breast: Secondary | ICD-10-CM | POA: Diagnosis not present

## 2013-12-30 ENCOUNTER — Telehealth: Payer: Self-pay | Admitting: *Deleted

## 2013-12-30 DIAGNOSIS — C50411 Malignant neoplasm of upper-outer quadrant of right female breast: Secondary | ICD-10-CM | POA: Insufficient documentation

## 2013-12-30 NOTE — Telephone Encounter (Signed)
Confirmed [facility] for 01/04/14 at 8am .  Instructions and contact information given.

## 2014-01-04 ENCOUNTER — Ambulatory Visit
Admission: RE | Admit: 2014-01-04 | Discharge: 2014-01-04 | Disposition: A | Discharge status: Home / Self Care | Payer: Medicare Other | Source: Ambulatory Visit | Attending: Radiation Oncology | Admitting: Radiation Oncology

## 2014-01-04 ENCOUNTER — Ambulatory Visit: Payer: Medicare Other

## 2014-01-04 ENCOUNTER — Ambulatory Visit (INDEPENDENT_AMBULATORY_CARE_PROVIDER_SITE_OTHER): Payer: Self-pay | Admitting: Surgery

## 2014-01-04 ENCOUNTER — Other Ambulatory Visit (HOSPITAL_BASED_OUTPATIENT_CLINIC_OR_DEPARTMENT_OTHER): Payer: Medicare Other

## 2014-01-04 ENCOUNTER — Encounter: Payer: Self-pay | Admitting: Hematology

## 2014-01-04 ENCOUNTER — Ambulatory Visit (HOSPITAL_BASED_OUTPATIENT_CLINIC_OR_DEPARTMENT_OTHER): Payer: Medicare Other | Admitting: Hematology

## 2014-01-04 ENCOUNTER — Encounter: Payer: Self-pay | Admitting: *Deleted

## 2014-01-04 VITALS — BP 120/55 | HR 68 | Temp 98.5°F | Resp 19 | Ht 68.0 in | Wt 197.7 lb

## 2014-01-04 DIAGNOSIS — C50411 Malignant neoplasm of upper-outer quadrant of right female breast: Secondary | ICD-10-CM | POA: Diagnosis not present

## 2014-01-04 DIAGNOSIS — C50811 Malignant neoplasm of overlapping sites of right female breast: Secondary | ICD-10-CM

## 2014-01-04 DIAGNOSIS — I2510 Atherosclerotic heart disease of native coronary artery without angina pectoris: Secondary | ICD-10-CM

## 2014-01-04 DIAGNOSIS — C50911 Malignant neoplasm of unspecified site of right female breast: Secondary | ICD-10-CM

## 2014-01-04 DIAGNOSIS — Z170 Estrogen receptor positive status [ER+]: Secondary | ICD-10-CM

## 2014-01-04 LAB — COMPREHENSIVE METABOLIC PANEL (CC13)
ALBUMIN: 4.1 g/dL (ref 3.5–5.0)
ALT: 26 U/L (ref 0–55)
AST: 22 U/L (ref 5–34)
Alkaline Phosphatase: 57 U/L (ref 40–150)
Anion Gap: 11 mEq/L (ref 3–11)
BILIRUBIN TOTAL: 1.33 mg/dL — AB (ref 0.20–1.20)
BUN: 21.3 mg/dL (ref 7.0–26.0)
CALCIUM: 10.1 mg/dL (ref 8.4–10.4)
CHLORIDE: 114 meq/L — AB (ref 98–109)
CO2: 20 meq/L — AB (ref 22–29)
Creatinine: 1.6 mg/dL — ABNORMAL HIGH (ref 0.6–1.1)
EGFR: 35 mL/min/{1.73_m2} — AB (ref >90)
Glucose: 183 mg/dl — ABNORMAL HIGH (ref 70–140)
POTASSIUM: 4.2 meq/L (ref 3.5–5.1)
SODIUM: 144 meq/L (ref 136–145)
TOTAL PROTEIN: 7.2 g/dL (ref 6.4–8.3)

## 2014-01-04 LAB — CBC WITH DIFFERENTIAL/PLATELET
BASO%: 0.5 % (ref 0.0–2.0)
Basophils Absolute: 0 10*3/uL (ref 0.0–0.1)
EOS ABS: 0.2 10*3/uL (ref 0.0–0.5)
EOS%: 3.4 % (ref 0.0–7.0)
HCT: 39.9 % (ref 34.8–46.6)
HGB: 13.5 g/dL (ref 11.6–15.9)
LYMPH%: 26.8 % (ref 14.0–49.7)
MCH: 30.6 pg (ref 25.1–34.0)
MCHC: 33.7 g/dL (ref 31.5–36.0)
MCV: 90.9 fL (ref 79.5–101.0)
MONO#: 0.5 10*3/uL (ref 0.1–0.9)
MONO%: 7.9 % (ref 0.0–14.0)
NEUT#: 4 10*3/uL (ref 1.5–6.5)
NEUT%: 61.4 % (ref 38.4–76.8)
Platelets: 174 10*3/uL (ref 145–400)
RBC: 4.39 10*6/uL (ref 3.70–5.45)
RDW: 13 % (ref 11.2–14.5)
WBC: 6.5 10*3/uL (ref 3.9–10.3)
lymph#: 1.7 10*3/uL (ref 0.9–3.3)

## 2014-01-04 NOTE — Progress Notes (Signed)
Checked in new pt with no financial concerns at this time. Pt has 2 insurances so financial assistance may not be needed. Informed pt of the [employer] and she has Raquel's card if she wants to apply for the grant as well as for any questions or concerns.

## 2014-01-04 NOTE — H&P (Signed)
[name] [name] 01/04/2014 9:05 AM Location: [facility] Patient #: [id number] DOB: April 25, 1955 Undefined / Language: Suszanne Conners / Race: Undefined Female History of Present Illness Marcello Moores A. Majesty Oehlert MD; 01/04/2014 11:00 AM) Patient words: RIGHT BREAST CANCER PT PRESENTS WITH 7 MM RIGHT BREAST CANCER. NO SXS. PICKED up ON MAMMOGRAM. DENIES PAIN OR NIPPLE DISCHARGE.          CLINICAL DATA: Screening callback for questioned right breast mass  EXAM: ULTRASOUND OF THE right BREAST  COMPARISON: Prior exams  FINDINGS: On physical exam, I palpate no abnormality in the right breast 9 o'clock location.  Ultrasound is performed, showing an irregular shadowing hypoechoic mass right breast 9 o'clock location 4 cm from the nipple measuring 7 x 7 x 7 mm. This corresponds to the mammographic abnormality.  There is no right axillary lymphadenopathy.  IMPRESSION: Suspicious right breast mass 9 o'clock location. Ultrasound-guided core biopsy will be performed today and dictated separately.  RECOMMENDATION: Ultrasound-guided right breast biopsy  I have discussed the findings and recommendations with the patient. Results were also provided in writing at the conclusion of the visit. If applicable, a reminder letter will be sent to the patient regarding the next appointment.  BI-RADS CATEGORY 4: Suspicious.   Electronically Signed By: Conchita Paris M.D. On: 12/29/2013 15:59      Diagnosis Breast, right, needle core biopsy, mass, 9 o'clock - INVASIVE DUCTAL CARCINOMA. - SEE COMMENT. Microscopic Comment Although definitive grading of breast carcinoma is best done on excision, the features of the invasive tumor from the 9 o'clock mass are compatible with a grade I-II breast carcinoma. Breast prognostic markers will be performed and reported in an addendum. Findings are called to the [facility] on 12/30/13. Dr. Lyndon Code has seen this case in  consultation with agreement. ([facility]:gt, 12/30/13) ROBERT HILLARD.  The patient is a 58 year old female   Other Problems Mirian Mo; 01/04/2014 9:05 AM) Anxiety Disorder Atrial Fibrillation Back Pain Bladder Problems Breast Cancer Chest pain Cholelithiasis Gastroesophageal Reflux Disease General anesthesia - complications Hepatitis Lump In Breast Migraine Headache Oophorectomy Bilateral. Thyroid Disease  Past Surgical History Marlowe Kays Goettsch; 01/04/2014 9:05 AM) Appendectomy Breast Biopsy Right. Gallbladder Surgery - Open Hysterectomy (not due to cancer) - Complete Oral Surgery Spinal Surgery - Lower Back  Diagnostic Studies History Mirian Mo; 01/04/2014 9:05 AM) Colonoscopy 1-5 years ago Mammogram within last year  Social History Marlowe Kays Goettsch; 01/04/2014 9:05 AM) Alcohol use Occasional alcohol use. Caffeine use Coffee. No drug use Tobacco use Former smoker.  Family History Marlowe Kays Goettsch; 01/04/2014 9:05 AM) Alcohol Abuse Brother. Cerebrovascular Accident Mother. Depression Mother. Diabetes Mellitus Mother. Heart Disease Mother. Heart disease in female family member before age 6 Heart disease in female family member before age 19 Hypertension Mother. Melanoma Brother. Migraine Headache Father. Thyroid problems Mother.  Pregnancy / Birth History Mirian Mo; 01/04/2014 9:05 AM) Age at menarche 49 years. Contraceptive History Oral contraceptives. Gravida 3 Irregular periods Maternal age 18-20 Para 1     Review of Systems Mirian Mo; 01/04/2014 9:05 AM) General Present- Appetite Loss, Chills, Fatigue and Weight Loss. Not Present- Fever, Night Sweats and Weight Gain. Skin Present- Dryness. Not Present- Change in Wart/Mole, Hives, Jaundice, New Lesions, Non-Healing Wounds, Rash and Ulcer. HEENT Present- Hearing Loss, Hoarseness, Oral Ulcers, Ringing in the Ears, Seasonal Allergies, Sinus Pain,  Sore Throat and Wears glasses/contact lenses. Not Present- Earache, Nose Bleed, Visual Disturbances and Yellow Eyes. Respiratory Not Present- Bloody sputum, Chronic Cough, Difficulty Breathing, Snoring and Wheezing. Breast  Not Present- Breast Mass, Breast Pain, Nipple Discharge and Skin Changes. Cardiovascular Present- Chest Pain, Leg Cramps, Palpitations, Rapid Heart Rate and Shortness of Breath. Not Present- Difficulty Breathing Lying Down and Swelling of Extremities. Gastrointestinal Present- Abdominal Pain and Indigestion. Not Present- Bloating, Bloody Stool, Change in Bowel Habits, Chronic diarrhea, Constipation, Difficulty Swallowing, Excessive gas, Gets full quickly at meals, Hemorrhoids, Nausea, Rectal Pain and Vomiting. Female Genitourinary Present- Urgency. Not Present- Frequency, Nocturia, Painful Urination and Pelvic Pain. Musculoskeletal Present- Back Pain. Not Present- Joint Pain, Joint Stiffness, Muscle Pain, Muscle Weakness and Swelling of Extremities. Neurological Present- Headaches. Not Present- Decreased Memory, Fainting, Numbness, Seizures, Tingling, Tremor, Trouble walking and Weakness. Psychiatric Present- Anxiety, Change in Sleep Pattern, Fearful and Frequent crying. Not Present- Bipolar and Depression. Endocrine Present- Cold Intolerance and Hair Changes. Not Present- Excessive Hunger, Heat Intolerance, Hot flashes and New Diabetes.   Physical Exam (Annalyse Langlais A. Monaca Wadas MD; 01/04/2014 10:57 AM)  General Mental Status-Alert. General Appearance-Consistent with stated age. Hydration-Well hydrated. Voice-Normal.  Head and Neck Head-normocephalic, atraumatic with no lesions or palpable masses. Trachea-midline. Thyroid Gland Characteristics - normal size and consistency.  Eye Eyeball - Bilateral-Extraocular movements intact. Sclera/Conjunctiva - Bilateral-No scleral icterus.  Chest and Lung Exam Chest and lung exam reveals -quiet, even and easy  respiratory effort with no use of accessory muscles and on auscultation, normal breath sounds, no adventitious sounds and normal vocal resonance. Inspection Chest Wall - Normal. Back - normal.  Breast Breast - Left-Symmetric, Non Tender, No Biopsy scars, no Dimpling, No Inflammation, No Lumpectomy scars, No Mastectomy scars, No Peau d' Orange. Breast - Right-Symmetric, Non Tender, No Biopsy scars, no Dimpling, No Inflammation, No Lumpectomy scars, No Mastectomy scars, No Peau d' Orange. Breast Lump-No Palpable Breast Mass.  Cardiovascular Cardiovascular examination reveals -normal heart sounds, regular rate and rhythm with no murmurs and normal pedal pulses bilaterally.  Abdomen Inspection Inspection of the abdomen reveals - No Hernias. Skin - Scar - no surgical scars. Palpation/Percussion Palpation and Percussion of the abdomen reveal - Soft, Non Tender, No Rebound tenderness, No Rigidity (guarding) and No hepatosplenomegaly. Auscultation Auscultation of the abdomen reveals - Bowel sounds normal.  Neurologic Neurologic evaluation reveals -alert and oriented x 3 with no impairment of recent or remote memory. Mental Status-Normal.  Musculoskeletal Normal Exam - Left-Upper Extremity Strength Normal and Lower Extremity Strength Normal. Normal Exam - Right-Upper Extremity Strength Normal and Lower Extremity Strength Normal.  Lymphatic Head & Neck  General Head & Neck Lymphatics: Bilateral - Description - Normal. Axillary  General Axillary Region: Bilateral - Description - Normal. Tenderness - Non Tender. Femoral & Inguinal  Generalized Femoral & Inguinal Lymphatics: Bilateral - Description - Normal. Tenderness - Non Tender.    Assessment & Plan (Nicholus Chandran A. Kieren Adkison MD; 01/04/2014 10:59 AM)  BREAST CANCER, RIGHT (174.9  C50.911) Impression: pt desires breast conservation. Risk of lumpectomy include bleeding, infection, seroma, more surgery, use of seed/wire, wound  care, cosmetic deformity and the need for other treatments, death , blood clots, death. Pt agrees to proceed. Risk of sentinel lymph node mapping include bleeding, infection, lymphedema, shoulder pain. stiffness, dye allergy. cosmetic deformity , blood clots, death, need for more surgery. Pt agres to proceed. discussed mastectomy and reconstruction as well.  Current Plans Referred to Genetic Counseling, for evaluation and follow up (Medical Genetics).

## 2014-01-04 NOTE — Progress Notes (Signed)
[facility] Psychosocial Distress Screening Clinical Social Work  Patient completed distress screening protocol and scored a 7 on the Psychosocial Distress Thermometer which indicates moderate distress. Clinical Social Worker met with patient in [facility] to assess for distress and other psychosocial needs.  Patient was visibly anxious and expressed concern for her son and how he would react to her diagnosis.  Patients husband passed away from colon cancer in 1998 and patient described how difficult the time was for her family.  Patient is the primary caregiver for her 28 year old son who receives social security disability and discussed her fears of his well being if she was unable to care for him.  Patient also expressed anxiety over how her son will react to her diagnosis and worries he will compare it to his fathers experience.  CSW validated patiens concerns and discussed tools to use when talking to her son and identifying the positive information regarding her treatment plan.  CSW informed patient of the support team and support services at [facility], and encouraged patient to meet with the financial advocate team to apply for the [employer].  CSw provided contact information and encouraged her to call with needs or concerns.          ONCBCN DISTRESS SCREENING 01/04/2014  Screening Type Initial Screening  Distress experienced in past week (1-10) 7  Practical problem type Housing  Family Problem type Children  Emotional problem type Nervousness/Anxiety;Adjusting to illness  Spiritual/Religous concerns type Facing my mortality  Information Concerns Type Lack of info about diagnosis;Lack of info about treatment  Physician notified of physical symptoms Yes  Referral to clinical social work Yes  Referral to financial advocate Yes  Referral to support programs Yes   [name], MSW, LCSW, OSW-C Clinical Social Worker [place] [phone]

## 2014-01-04 NOTE — Progress Notes (Signed)
[place]  Telephone:([phone]) [phone] Fax:([phone]) [place] Note   Patient Care Team: Abner Greenspan, MD as PCP - General Minus Breeding, MD as Consulting Physician (Cardiology) Trinda Pascal, NP as Nurse Practitioner (Nurse Practitioner) Erroll Luna, MD as Consulting Physician (General Surgery) Thea Silversmith, MD as Consulting Physician (Radiation Oncology) Truitt Merle, MD as Consulting Physician (Hematology) 01/04/2014  CHIEF COMPLAINTS/PURPOSE OF CONSULTATION:  Diagnosis stage IA right breast cancer, cT1bN0    Breast cancer of upper-outer quadrant of right female breast, T1bN0, Stage IA    12/29/2013 Imaging Screening mammogram and [place]: an irregular shadowing hypoechoic mass right breast 9 o'clock location 4 cm from the nipple measuring 7 x 7 x 7 mm. No adenopathy    12/29/2013 Initial Diagnosis Breast cancer of upper-outer quadrant of right female breast   12/29/2013 Initial Biopsy Grade I-II IDA, ER+/PR+/HER2-, Ki67 10%      HISTORY OF PRESENTING ILLNESS:  [name] [name] 58 y.o. female presents to our breast multidisciplinary clinic today because of newly diagnosed breast cancer.   This was discovered by routine screening mammogram a few weeks ago. She underwent diagnostic mammogram and ultrasound on 12/29/2013 which showed an irregular shadowing hypoechoic mass in right breast likely position measuring 7 mm. No adenopathy. She underwent core needle biopsy on the same day, which showed grade 1-2 invasive ductal adenocarcinoma, ER positive PR positive and HER-2 negative, Ki-67 10%.  She has been feeling very anxious since the biopsy. She does have history of coronary artery disease, status post stent placement in [facility] 7. Since the breast cancer diagnosis, she has had several episodes of anxiety attack, hypoventilation, with chest tightness. She appeared quite nervous when I met her first in the clinic. Before the biopsy, she felt well in  general, she does have intermittent migraine headaches, chronic leg swollen, intermittent chest pain, denies any other new symptoms, no weight loss or other changes.  She lives with her only son, who is 68 years old, on disability due to depression and suicidal ideas. She is his caregiver.    MEDICAL HISTORY:  Past Medical History  Diagnosis Date  . Depression   . Anorexia   . Migraine   . GERD (gastroesophageal reflux disease)   . Seasonal allergies   . Arrhythmia   . CAD (coronary artery disease), s/p stent placement  2007   . Frequent UTI   . Edema   . Vitamin D deficiency   . Restless leg syndrome   . Microscopic hematuria   . Hypothyroid   She has psychologist Dr. Rica Mote   SURGICAL HISTORY: Past Surgical History  Procedure Laterality Date  . Cholecystectomy open    . Appendectomy    . Abdominal hysterectomy  1983  . Back surgery  1997    herniated disc repair  . Cystoscopy      neg  Back surgery in 1997   SOCIAL HISTORY: History   Social History  . Marital Status: Widowed, husband died of colon cancer in 79     Spouse Name: N/A    Number of Children: One son  21 yo, on disability for depression, who lives with her   . Years of Education: N/A   Occupational History  . Secretary, Clinical cytogeneticist, retired     Social History Main Topics  . Smoking status: Former Smoker    Start date: 10/14/1971  . Smokeless tobacco: Never Used  . Alcohol Use: Yes     Comment: socially  .  Drug Use: No  . Sexual Activity: Not on file   Other Topics Concern  . Not on file   Social History Narrative   Does not get any regular exercise      Doctors   -uro-Ottlein   -cardio--hochrein   -Neurology--Dr. Jannifer Franklin   -allergies--Dr. Carmelina Peal   Gyn past--Dr. Warnell Forester   Couselor--Dr. Mitchum   Endo-- Dr. Chalmers Cater      Lives with son who is 74 years old    FAMILY HISTORY: Family History  Problem Relation Age of Onset  . Coronary artery disease Mother   . Thyroid disease  Mother   . Alzheimer's disease Mother   . Diabetes Mother   . Heart disease Mother   . Cancer Brother     melnoma  . Cancer Paternal Aunt     ovarian  . Cancer Paternal Uncle     bone  . Cancer Maternal Grandmother     pancreatic  . Heart disease Maternal Grandfather    GYN HISTORY  Menarchal: 10 LMP: 1983 hysrectomy  Contraceptive: 2 years  HRT: was on for 10-20 years, off several years  GxP1: several miscarriages     ALLERGIES:  is allergic to iohexol.  MEDICATIONS:  Current Outpatient Prescriptions  Medication Sig Dispense Refill  . aspirin 325 MG tablet Take 325 mg by mouth daily.     . cephALEXin (KEFLEX) 250 MG capsule Take 250 mg by mouth daily.      Marland Kitchen CINNAMON PO Take 1,000 mg by mouth daily.     . Coenzyme Q10 (CO Q-10) 200 MG CAPS Take 1 capsule by mouth daily.      . divalproex (DEPAKOTE) 250 MG DR tablet Take 1 tablet by mouth daily for 1 week then increase to 1 tablet by mouth twice a day. 60 tablet 3  . fish oil-omega-3 fatty acids 1000 MG capsule Take 1.2 g by mouth 2 (two) times daily.    . furosemide (LASIX) 20 MG tablet Take 1 tablet (20 mg total) by mouth daily. 90 tablet 3  . Ginkgo Biloba 120 MG CAPS Take 1 capsule by mouth daily.      Marland Kitchen levothyroxine (SYNTHROID, LEVOTHROID) 112 MCG tablet Take 112 mcg by mouth daily.    . Multiple Vitamin (MULTIVITAMIN) capsule Take 1 capsule by mouth daily.    . nitroGLYCERIN (NITROSTAT) 0.4 MG SL tablet Place 0.4 mg under the tongue every 5 (five) minutes as needed. Pain    . pantoprazole (PROTONIX) 40 MG tablet Take 1 tablet (40 mg total) by mouth 2 (two) times daily. 60 tablet 11  . potassium chloride SA (KLOR-CON M20) 20 MEQ tablet TAKE 2 TABLETS BY MOUTH TWICE A DAY AND 1/2 TABLET AT BEDTIME 135 tablet 11  . propranolol ER (INDERAL LA) 120 MG 24 hr capsule TAKE ONE CAPSULE BY MOUTH EVERY DAY AT BEDTIME 30 capsule 5  . ranitidine (ZANTAC) 300 MG tablet Take 1 tablet (300 mg total) by mouth 2 (two) times daily. 60  tablet 11  . simvastatin (ZOCOR) 40 MG tablet TAKE 1 TABLET BY MOUTH AT BEDTIME 30 tablet 9  .      . topiramate (TOPAMAX) 50 MG tablet Take 1 tablet  in the AM & 2 tablets in the PM for 1 week. Then to 1 tablet twice a day for 1 week, then 1 tablet daily for 1 week then stop. 50 tablet 0  . trimethoprim (TRIMPEX) 100 MG tablet Take 100 mg by mouth at bedtime.  No current facility-administered medications for this visit.    REVIEW OF SYSTEMS:   Constitutional: Denies fevers, chills or abnormal night sweats Eyes: Denies blurriness of vision, double vision or watery eyes Ears, nose, mouth, throat, and face: Denies mucositis or sore throat Respiratory: Denies cough, dyspnea or wheezes Cardiovascular: (+) palpitation and intermittent chest pain, (+) lower extremity swelling Gastrointestinal:  Denies nausea, heartburn or change in bowel habits Skin: Denies abnormal skin rashes Lymphatics: Denies new lymphadenopathy or easy bruising Neurological:Denies numbness, tingling or new weaknesses Behavioral/Psych: (+) very anxious All other systems were reviewed with the patient and are negative.  PHYSICAL EXAMINATION: ECOG PERFORMANCE STATUS: 0 - Asymptomatic  Filed Vitals:   01/04/14 0859  BP: 120/55  Pulse: 68  Temp: 98.5 F (36.9 C)  Resp: 19   Filed Weights   01/04/14 0859  Weight: 197 lb 11.2 oz (89.676 kg)    GENERAL:alert, no distress and comfortable SKIN: skin color, texture, turgor are normal, no rashes or significant lesions EYES: normal, conjunctiva are pink and non-injected, sclera clear OROPHARYNX:no exudate, no erythema and lips, buccal mucosa, and tongue normal  NECK: supple, thyroid normal size, non-tender, without nodularity LYMPH:  no palpable lymphadenopathy in the cervical, axillary or inguinal LUNGS: clear to auscultation and percussion with normal breathing effort HEART: regular rate & rhythm and no murmurs and no lower extremity edema ABDOMEN:abdomen soft,  non-tender and normal bowel sounds Musculoskeletal:no cyanosis of digits and no clubbing  PSYCH: alert & oriented x 3 with fluent speech NEURO: no focal motor/sensory deficits Breasts: Breast inspection showed them to be symmetrical with no nipple discharge. Palpation of the breasts and axilla revealed no obvious mass that I could appreciate. A small skin bruise at her biopsy site of the right breast.   LABORATORY DATA:  I have reviewed the data as listed Lab Results  Component Value Date   WBC 6.5 01/04/2014   HGB 13.5 01/04/2014   HCT 39.9 01/04/2014   MCV 90.9 01/04/2014   PLT 174 01/04/2014    Recent Labs  11/16/13 1514 11/18/13 1438 01/04/14 0823  NA  --  142 144  K  --  4.2 4.2  CL  --  110  --   CO2  --  22 20*  GLUCOSE  --  116* 183*  BUN  --  23 21.3  CREATININE  --  1.35* 1.6*  CALCIUM  --  9.4 10.1  PROT 7.3 6.6 7.2  ALBUMIN  --  4.1 4.1  AST '26 20 22  ' ALT '28 22 26  ' ALKPHOS 54 47 57  BILITOT 1.3* 1.4* 1.33*  BILIDIR 0.29  --   --    PATHOLOGY REPORT 12/30/2013 Breast, right, needle core biopsy, mass, 9 o'clock - INVASIVE DUCTAL CARCINOMA. - SEE COMMENT. Microscopic Comment Although definitive grading of breast carcinoma is best done on excision, the features of the invasive tumor from the 9 o'clock mass are compatible with a grade I-II breast carcinoma.  RADIOGRAPHIC STUDIES: I have personally reviewed the radiological images as listed and agreed with the findings in the report.  US Breast Ltd Uni Right Inc Axilla 12/29/2013      FINDINGS: On physical exam, I palpate no abnormality in the right breast 9 o'clock location.  Ultrasound is performed, showing an irregular shadowing hypoechoic mass right breast 9 o'clock location 4 cm from the nipple measuring 7 x 7 x 7 mm. This corresponds to the mammographic abnormality.  There is no right axillary lymphadenopathy.  IMPRESSION: Suspicious  right breast mass 9 o'clock location. Ultrasound-guided core biopsy will  be performed today and dictated separately.  RECOMMENDATION: Ultrasound-guided right breast biopsy  I have discussed the findings and recommendations with the patient. Results were also provided in writing at the conclusion of the visit. If applicable, a reminder letter will be sent to the patient regarding the next appointment.  BI-RADS CATEGORY  4: Suspicious.   Electronically Signed   By: Conchita Paris M.D.   On: 12/29/2013 15:59   Mm Screening Breast Tomo Bilateral 12/08/2013      FINDINGS: In the right breast, a possible mass warrants further evaluation with ultrasound. In the left breast, no findings suspicious for malignancy. Images were processed with CAD.   IMPRESSION: Further evaluation is suggested for possible mass in the right breast.  RECOMMENDATION: Ultrasound of the right breast. (Code:[id number])  The patient will be contacted regarding the findings, and additional imaging will be scheduled.  BI-RADS CATEGORY  0: Incomplete. Need additional imaging evaluation and/or prior mammograms for comparison.   Electronically Signed   By: Curlene Dolphin M.D.   On: 12/08/2013 15:50     ASSESSMENT & PLAN:  58 year old postmenopausal woman, with past medical history of coronary disease, anxiety and depression, who was found by screening mammogram to have a T1b N0 M0 stage I a invasive ductal carcinoma, grade I-II, ER/PR strongly positive, HER-2 negative, Ki-67 10%.  1.  T1b N0 M0 stage IA invasive ductal carcinoma of right breast, grade I-II, ER/PR strongly positive, HER-2 negative -She is also meeting breast surgeon Dr. Brantley Stage today to discuss surgery, she is likely going to have lumpectomy. -Due to her strong ER/PR positivity of her tumor, I recommend adjuvant 5 years of aromatase inhibitor, to reduce her future risks of cancer recurrence both in breast and distant metastasis. She agrees. -I recommend Oncotype test on her surgical breast tumor sample. If her tumor has high recurrent score, I would  recommend adjuvant  chemotherapy, likely docetaxel and Cytoxan. If Oncotype recurrence score is low, I would not recommend chemotherapy. She agrees. We'll send her surgical tumor sample for Oncotype test after surgery.  Plan I'll see her back 3-4 weeks after her breast surgery. We'll go over her surgical findings and her Oncotype results. Final decision about adjuvant chemotherapy based on her Oncotype test result. She will likely start adjuvant hormonal therapy after she completes adjuvant radiation.  All questions were answered. The patient knows to call the clinic with any problems, questions or concerns. I spent 40 minutes counseling the patient face to face. The total time spent in the appointment was 60 minutes and more than 50% was on counseling.     Truitt Merle, MD 01/04/2014 10:10 AM

## 2014-01-04 NOTE — Progress Notes (Signed)
Ms. Mickel is a very pleasant 58 y.o.Marland Kitchen female from [place], [place] with newly diagnosed grade 1-2 invasive ductal carcinoma of the right breast.  Biopsy results revealed the tumor's hormone status as ER positive, PR positive, and HER2/neu negative. Ki67 is 10*%.  She presents unaccompanied to the [facility]) for treatment consideration and recommendations from the breast surgeon, radiation oncologist, and medical oncologist.     I briefly met with Ms. Zhao  during her [facility] visit today. We discussed the purpose of the Survivorship Clinic, which will include monitoring for recurrence, coordinating completion of age and gender-appropriate cancer screenings, promotion of overall wellness, as well as managing potential late/long-term side effects of anti-cancer treatments.    As of today, the treatment plan for Ms. Nepomuceno will likely include surgery and radiation therapy.  She will also meet with the Genetics Counselor due to her family history of various malignanices. Endocrine therapy with an aromatase inhibitor will be considered for her as well. The intent of treatment for Ms. Greenslade is cure, therefore she will be eligible for the Survivorship Clinic upon her completion of treatment.  Her survivorship care plan (SCP) document will be drafted and updated throughout the course of her treatment trajectory. She will receive the SCP in an office visit with myself in the Survivorship Clinic once she has completed treatment.   Ms. Amendola was encouraged to ask questions and all questions were answered to her satisfaction.  She was given my business card and encouraged to contact me with any concerns regarding survivorship.  I look forward to participating in her care.   Mike Craze, NP

## 2014-01-05 NOTE — Progress Notes (Signed)
Radiation Oncology         [phone]) [phone] ________________________________  Initial outpatient Consultation - Date: 01/04/2014   Name: [name] [name] MRN: [id number]   DOB: September 28, 1955  REFERRING PHYSICIAN: Abner Greenspan, MD  DIAGNOSIS:    ICD-9-CM ICD-10-CM   1. Breast cancer of upper-outer quadrant of right female breast 174.4 C50.411     STAGE: Breast cancer of upper-outer quadrant of right female breast   Staging form: Breast, AJCC 7th Edition     Clinical stage from 01/04/2014: Stage IA (T1b, N0, M0) - Unsigned   HISTORY OF PRESENT ILLNESS::[name] [name] is a 58 y.o. female  Who had a routine screening mammogram a few weeks ago.  Ultrasound confirmed a mass in the 8 oclock position which measured 7 mm. No adenopathy was noted. Biopsy was performed which showed a Grade 1-2 invasive ductal carcinoma. ER positive, PR positive, HER2 negative. Ki67 was 10%. She is unaccompanied today. She is concerned about what would happen to her son if she dies from this cancer. She is anxious and wonders if this could be caught on a Pap Smear or clinic breast exam which she is adamant her PCP has not performed. She is experiencing her intermittent chest pain which is normal for her.  She had menarche at 55 with a hysterectomy in 69. She was on and off HRT for 10-20 years but has not taken this for several years. She is GXP1. She has had some soreness after her biopsy.   PREVIOUS RADIATION THERAPY: No  FAMILY HISTORY:  Family History  Problem Relation Age of Onset  . Coronary artery disease Mother   . Thyroid disease Mother   . Alzheimer's disease Mother   . Diabetes Mother   . Heart disease Mother   . Cancer Brother     melnoma  . Cancer Paternal Aunt     ovarian  . Cancer Paternal Uncle     bone  . Cancer Maternal Grandmother     pancreatic  . Heart disease Maternal Grandfather     SOCIAL HISTORY:  History  Substance Use Topics  . Smoking status: Never Smoker   . Smokeless  tobacco: Never Used  . Alcohol Use: Yes     Comment: socially    REVIEW OF SYSTEMS:  A 15 point review of systems is documented in the electronic medical record. This was obtained by the nursing staff. However, I reviewed this with the patient to discuss relevant findings and make appropriate changes.  Pertinent positives are included in the chart.   PHYSICAL EXAM: There were no vitals filed for this visit.. . Pleasant female in moderate distress. No palpable cervical, supraclavicular or axillary adenopathy. She has a small bruise at her right breast. She has no palpable mass in this area. No palpable abnormalities of the left breast.   IMPRESSION: T1bN0 Invasive Ductal Carcinoma of the right breast.   PLAN: I spoke to the patient today regarding her diagnosis and options for treatment. We discussed the equivalence in terms of survival and local failure between mastectomy and breast conservation. We discussed the role of radiation in decreasing local failures in patients who undergo lumpectomy. We discussed the process of simulation and the placement tattoos. We discussed 4-6 weeks of treatment as an outpatient. We discussed the possibility of asymptomatic lung damage. We discussed the low likelihood of secondary malignancies. We discussed the possible side effects including but not limited to skin redness, fatigue, permanent skin darkening, and breast swelling. We  discussed the process of simulation and the placement of tattoos. I will see her back after her Oncotype score. I did clarify with her that if she needed chemotherapy this would be performed prior to radiation. She met with medical oncology as well as a member of our patient family support team.. I will plan on seeing her back after her surgery.   We will try to get her some resources related to her son's care.   I spoke to her about different recommendations regarding screening for breast cancer. We discussed the small nature of her cancer  and the high cure rate in this stage of disease.   I spent 40 minutes face to face with the patient and more than 50% of that time was spent in counseling and/or coordination of care.   ------------------------------------------------  Thea Silversmith, MD

## 2014-01-05 NOTE — Addendum Note (Signed)
Encounter addended by: Thea Silversmith, MD on: 01/05/2014  8:01 AM<BR>     Documentation filed: Follow-up Section, LOS Section

## 2014-01-09 ENCOUNTER — Telehealth: Payer: Self-pay | Admitting: *Deleted

## 2014-01-09 DIAGNOSIS — F331 Major depressive disorder, recurrent, moderate: Secondary | ICD-10-CM | POA: Diagnosis not present

## 2014-01-09 NOTE — Telephone Encounter (Signed)
Left vm for pt to return call regarding [place] from 01/04/14. Contact information given.

## 2014-01-11 ENCOUNTER — Other Ambulatory Visit (INDEPENDENT_AMBULATORY_CARE_PROVIDER_SITE_OTHER): Payer: Self-pay | Admitting: Surgery

## 2014-01-11 ENCOUNTER — Ambulatory Visit (HOSPITAL_BASED_OUTPATIENT_CLINIC_OR_DEPARTMENT_OTHER): Payer: Medicare Other | Admitting: Genetic Counselor

## 2014-01-11 ENCOUNTER — Encounter: Payer: Self-pay | Admitting: Genetic Counselor

## 2014-01-11 ENCOUNTER — Other Ambulatory Visit: Payer: Medicare Other

## 2014-01-11 DIAGNOSIS — Z800 Family history of malignant neoplasm of digestive organs: Secondary | ICD-10-CM

## 2014-01-11 DIAGNOSIS — Z808 Family history of malignant neoplasm of other organs or systems: Secondary | ICD-10-CM

## 2014-01-11 DIAGNOSIS — C50811 Malignant neoplasm of overlapping sites of right female breast: Secondary | ICD-10-CM

## 2014-01-11 DIAGNOSIS — C50911 Malignant neoplasm of unspecified site of right female breast: Secondary | ICD-10-CM

## 2014-01-11 DIAGNOSIS — Z8041 Family history of malignant neoplasm of ovary: Secondary | ICD-10-CM | POA: Diagnosis not present

## 2014-01-11 DIAGNOSIS — Z315 Encounter for genetic counseling: Secondary | ICD-10-CM | POA: Diagnosis not present

## 2014-01-11 DIAGNOSIS — Z801 Family history of malignant neoplasm of trachea, bronchus and lung: Secondary | ICD-10-CM

## 2014-01-11 NOTE — Progress Notes (Signed)
REFERRING PROVIDER: Abner Greenspan, MD [address] [place], [place] [address]  PRIMARY PROVIDER:  Loura Pardon, MD  PRIMARY REASON FOR VISIT:  1. Breast cancer, right   2. Family history of ovarian cancer   3. Family history of pancreatic cancer      HISTORY OF PRESENT ILLNESS:   [name], a 58 y.o. female, was seen for a [facility] cancer genetics consultation at the request of Dr. Glori Bickers due to a personal and family history of cancer.  Ms. Brasil presents to clinic today to discuss the possibility of a hereditary predisposition to cancer, genetic testing, and to further clarify her future cancer risks, as well as potential cancer risks for family members.   CANCER HISTORY:    Breast cancer of upper-outer quadrant of right female breast   12/29/2013 Imaging Screening mammogram and [place]: an irregular shadowing hypoechoic mass right breast 9 o'clock location 4 cm from the nipple measuring 7 x 7 x 7 mm. No adenopathy    12/29/2013 Initial Diagnosis Breast cancer of upper-outer quadrant of right female breast   12/29/2013 Initial Biopsy Grade I-II IDA, ER+/PR+/HER2-, Ki67 10%      HISTORY OF PRESENT ILLNESS: In 2015, at the age of 15, [name] was diagnosed with invasive ductal carcinoma of the right breast. The tumor is ER+/PR+/Her2-. This will be treated with lumpectomy, depending on genetic testing.  The patient is very anxious, and became overwhelmed by our discussion about the genetics and her family history of cancer.     HORMONAL RISK FACTORS:  Menarche was at age 9.  First live birth at age 47.  OCP use for approximately 2 years.  Ovaries intact: no.  Hysterectomy: yes.  Menopausal status: postmenopausal.  HRT use: 10-20 years. Colonoscopy: yes; normal. Mammogram within the last year: yes. Number of breast biopsies: 0. Up to date with pelvic exams:  Patient had hysterectomy in 1983. Any excessive radiation exposure in the past:  no  Past  Medical History  Diagnosis Date  . Depression   . Anorexia   . Migraine   . GERD (gastroesophageal reflux disease)   . Seasonal allergies   . Arrhythmia   . CAD (coronary artery disease)   . Frequent UTI   . Edema   . Vitamin D deficiency   . Restless leg syndrome   . Microscopic hematuria   . Hypothyroid   . Breast cancer     ER+/PR+/Her2-    Past Surgical History  Procedure Laterality Date  . Cholecystectomy open    . Appendectomy    . Abdominal hysterectomy    . Back surgery  1997    herniated disc repair  . Cystoscopy      neg    History   Social History  . Marital Status: Widowed    Spouse Name: N/A    Number of Children: 1  . Years of Education: N/A   Occupational History  . not working    Social History Main Topics  . Smoking status: Never Smoker   . Smokeless tobacco: Never Used  . Alcohol Use: Yes     Comment: socially  . Drug Use: No  . Sexual Activity: None   Other Topics Concern  . None   Social History Narrative   Does not get any regular exercise      Doctors   -uro-Ottlein   -cardio--hochrein   -Neurology--Dr. Jannifer Franklin   -allergies--Dr. Carmelina Peal   Gyn past--Dr. Warnell Forester  Couselor--Dr. Rica Mote   Endo-- Dr. Chalmers Cater      Lives with son who is 23 years old     FAMILY HISTORY:  We obtained a detailed, 4-generation family history.  Significant diagnoses are listed below: Family History  Problem Relation Age of Onset  . Coronary artery disease Mother   . Thyroid disease Mother   . Alzheimer's disease Mother   . Diabetes Mother   . Heart disease Mother   . Cancer Brother     melnoma  . Ovarian cancer Paternal Aunt 68  . Bone cancer Paternal Uncle   . Alzheimer's disease Maternal Grandmother   . Heart disease Maternal Grandfather   . Pancreatic cancer Maternal Grandfather 64  . Ovarian cancer Maternal Aunt     dx in 67s  . Throat cancer Maternal Uncle     smoker  . Lung cancer Maternal Uncle     heavy smoker  . Ovarian cancer  Paternal Aunt     dx in her 107s  . Cancer Cousin     "female cancer"   The patient has 2 brothers, one of whom has melanoma.  She has a maternal aunt with ovarian cancer and a maternal grandfather with pancreatic cancer.  Her father had 11 siblings, two aunts had ovarian cancer and an uncle with bone cancer.  She has a paternal first cousin with female cancer. Patient's maternal ancestors are of [place] descent, and paternal ancestors are of [place] descent. There is no reported Ashkenazi Jewish ancestry. There is no known consanguinity.  GENETIC COUNSELING ASSESSMENT: [name] [name] is a 58 y.o. female with a personal and family history of cancer which somewhat suggestive of a hereditary cancer syndrome and predisposition to cancer. We, therefore, discussed and recommended the following at today's visit.   DISCUSSION: We reviewed the characteristics, features and inheritance patterns of hereditary cancer syndromes. We also discussed genetic testing, including the appropriate family members to test, the process of testing, insurance coverage and turn-around-time for results. We reviewed the hereditary cancer syndromes that would be most commonly associated with the cnacer in her family including BRCA mutations, Lynch syndrome mutations and CDKN2A, with the multiple melanoma in her brother and pancreatic cancer in her grandfather.  We discussed the implications of a negative, positive and/or variant of uncertain significant result. In order to get genetic test results in a timely manner so that [name] can use these genetic test results for surgical decisions, we recommended [name] pursue genetic testing for the BRCAPlus. If this test is negative, we then recommend [name] pursue reflex genetic testing to the CancerNext gene panel.   PLAN: After considering the risks, benefits, and limitations,[name]  provided informed consent to pursue genetic testing and the blood sample was sent to [facility] for analysis of the BRCAPlus and CancerNext panel testing. Results should be available within approximately 2 weeks for BRCAPlus and an additional 4 weeks for the cancerNext weeks' time, at which point they will be disclosed by telephone to [name], as will any additional recommendations warranted by these results. [name] will receive a summary of her genetic counseling visit and a copy of her results once available. This information will also be available in Epic. We encouraged [name] to remain in contact with cancer genetics annually so that we can continuously update the family history and inform her of any changes in cancer genetics and testing that may be of benefit for her family. [name] questions  were answered to her satisfaction today. Our contact information was provided should additional questions or concerns arise.  Lastly, we encouraged [name] to remain in contact with cancer genetics annually so that we can continuously update the family history and inform her of any changes in cancer genetics and testing that may be of benefit for this family.   Ms.  [name] [name] questions were answered to her satisfaction today. Our contact information was provided should additional questions or concerns arise. Thank you for the referral and allowing [place] to share in the care of your patient.   Elder Davidian P. Florene Glen, [place], [facility] Certified Genetic Counselor [email or web address] phone: [phone]  The patient was seen for a total of 60 minutes in face-to-face genetic counseling.  This patient was discussed with Drs. Magrinat, Lindi Adie and/or Burr Medico who agrees with the above.    _______________________________________________________________________ For Office Staff:  Number of people involved in session: 1 Was an Intern/ student involved with case: no

## 2014-01-17 ENCOUNTER — Telehealth: Payer: Self-pay | Admitting: Hematology

## 2014-01-17 ENCOUNTER — Encounter: Payer: Self-pay | Admitting: *Deleted

## 2014-01-17 NOTE — Telephone Encounter (Addendum)
Lft msg for pt confirming postop f/u with MD per 12/22 POF, mailed sch to pt... [name] [name]

## 2014-01-25 ENCOUNTER — Telehealth: Payer: Self-pay | Admitting: Genetic Counselor

## 2014-01-25 ENCOUNTER — Encounter: Payer: Self-pay | Admitting: Genetic Counselor

## 2014-01-25 DIAGNOSIS — Z1379 Encounter for other screening for genetic and chromosomal anomalies: Secondary | ICD-10-CM | POA: Insufficient documentation

## 2014-01-25 NOTE — Telephone Encounter (Signed)
Revealed negative genetic testing on BRCAPlus panel testing.  Based on her personal and family history of cancer, we are reflexing to the CancernExt panel.

## 2014-01-25 NOTE — Telephone Encounter (Signed)
LM on VM with good news on test results and to please call back.

## 2014-01-25 NOTE — Progress Notes (Signed)
HPI: Ms. Grigoryan was previously seen in the [facility] clinic due to a personal and family history of cancer and concerns regarding a hereditary predisposition to cancer. Please refer to our prior cancer genetics clinic note for more information regarding Ms. Rosene's medical, social and family histories, and our assessment and recommendations, at the time. Ms. Stehr recent genetic test results were disclosed to her, as were recommendations warranted by these results. These results and recommendations are discussed in more detail below.  GENETIC TEST RESULTS: At the time of Ms. Woodrome's visit, we recommended she pursue genetic testing of the BRCAPlus gene panel, reflexing to the CancerNext panel testing if BRCAPlus is negative. This test, which included sequencing and deletion/duplication analysis of the following genes:  BRCA1, BRCA2, CDH1, PALB2, PTEN and TP53.  The report date is 01/24/14.  Testing was performed at [facility]. Genetic testing was normal, and did not reveal a deleterious mutation in these genes. The test report has been scanned into EPIC and is located under the Media tab.   Ms. Ohlrich seems confused about her testing options, and what they are supposed to tell her.  We reviewed our conversation from January 11, 2014.  I explained the plan again, and how the BRCAPlus testing can help inform her surgery options, and the larger cancer panel will help inform how to manage her care, if it is positive.  She seemed to understand this after the second discussion.  CANCER SCREENING RECOMMENDATIONS: This result is reassuring and suggests that Ms. Mcelhinny's cancer was most likely not due to an inherited predisposition associated with one of these genes. Most cancers happen by chance and this negative test, along with details of her family history, suggests that her cancer falls into this category. We, therefore, recommended she continue to follow the  cancer management and screening guidelines provided by her oncology and primary providers.   RECOMMENDATIONS FOR FAMILY MEMBERS: Women in this family might be at some increased risk of developing cancer, over the general population risk, simply due to the family history of cancer. We recommended women in this family have a yearly mammogram beginning at age 10, an an annual clinical breast exam, and perform monthly breast self-exams. Women in this family should also have a gynecological exam as recommended by their primary provider. All family members should have a colonoscopy by age 44.  FOLLOW-UP: Lastly, we discussed with Ms. Jakes that cancer genetics is a rapidly advancing field and it is possible that new genetic tests will be appropriate for her and/or her family members in the future. We encouraged her to remain in contact with cancer genetics on an annual basis so we can update her personal and family histories and let her know of advances in cancer genetics that may benefit this family.   Our contact number was provided. Ms.. Patman questions were answered to her satisfaction, and she knows she is welcome to call us at anytime with additional questions or concerns.   Roma Kayser, MS, [facility] Certified Genetic Counselor [email or web address]

## 2014-02-02 DIAGNOSIS — F331 Major depressive disorder, recurrent, moderate: Secondary | ICD-10-CM | POA: Diagnosis not present

## 2014-02-07 ENCOUNTER — Ambulatory Visit (INDEPENDENT_AMBULATORY_CARE_PROVIDER_SITE_OTHER): Payer: Medicare Other | Admitting: Adult Health

## 2014-02-07 ENCOUNTER — Encounter (HOSPITAL_BASED_OUTPATIENT_CLINIC_OR_DEPARTMENT_OTHER): Payer: Self-pay | Admitting: *Deleted

## 2014-02-07 ENCOUNTER — Encounter: Payer: Self-pay | Admitting: Adult Health

## 2014-02-07 VITALS — BP 110/65 | HR 62 | Ht 68.0 in | Wt 197.6 lb

## 2014-02-07 DIAGNOSIS — R29898 Other symptoms and signs involving the musculoskeletal system: Secondary | ICD-10-CM

## 2014-02-07 DIAGNOSIS — G43019 Migraine without aura, intractable, without status migrainosus: Secondary | ICD-10-CM

## 2014-02-07 MED ORDER — TOPIRAMATE 100 MG PO TABS: 100.0000 mg | ORAL_TABLET | Freq: Every day | ORAL | Status: DC

## 2014-02-07 NOTE — Patient Instructions (Signed)
Continue Topamax Unsure if Depakote has been beneficial. You can do a trial off Depakote and see how that affects your headaches.  Let us know if your headaches gets worse off the Depakote. Schedule your second round of Botox injections.  If your symptoms worsen or if you develop new symptoms please let us know.

## 2014-02-07 NOTE — Progress Notes (Signed)
I have read the note, and I agree with the clinical assessment and plan.  [name] [name]   

## 2014-02-07 NOTE — Progress Notes (Signed)
PATIENT: [name] [name] DOB: 1955-03-20  REASON FOR VISIT: follow up- Migraines HISTORY FROM: patient  HISTORY OF PRESENT ILLNESS: [name] [name] is a 59 year old female with a history of migraine headaches she returns today for follow-up. She reports that she had her first Botox injections.  She is having approximately 3-4  headaches per week. She states that some days she will have dizziness and blurry vision that will eventually develop into a headache. She states that her severe headaches have improved. She is unsure how many severe headaches she has in a months times.  She states that she started the Depakote and increasing to two tablets caused her appetite to increase so she decreased to 1 tablet. She did wean off the Topamax but restarted taking 1 tablet since she decreased off the Depakote. She does feel that the deficit has affected her balance.She states that she was diagnosed with breast cancer at the beginning of the December. She is going to have surgery to remove the cancer next week. Afterwards she will be doing radiation. She states with everything going on she can't tell what symptoms are related to what. She states that her anxiety has increased due to the cancer diagnosis.   HISTORY 11/15/13: [name] [name] is a 60 year old female with a history of migraine. She returns today for follow-up. She was started on Effexor and reports that she could not tolerate the medication. She states that it made her feel like a "zombie."  She reports that has approximately 4 headaches per week but this week she has gone four days without a headache. She has continued using the Imitrex and Topamax. She states that since she stopped the inderal and she has noted a drastic difference in her fatigue. During her headaches she experience nausea and sometimes vomiting. + photophobia and phonophobia. Laying down makes her head hurt worse. She will have numbness on the left side of the scalp in the temporal area.  She states that pain usually in the left frontal or left occipital area. Her headaches can last 5-6 hours or up to one day. She will sometimes get an aura with her migraines. She will have vision changes- states that the lighting changes. Describes it as the light in the room is dim. She has tried nortriptyline and it was unsuccessful.   HISTORY 09/15/13(CW): [name] [name] is a 59 year old right-handed white female with a history of migraine headache. The patient has run out of her Topamax when last seen, and her headaches have become daily. She is now back on 100 mg twice daily of Topamax, with some improvement of the headaches, but she is still having 2 or 3 headaches a week. The patient is also felt somewhat low on energy, not depressed, but feeling fatigued. The patient was noted to have bradycardia when she saw her cardiologist, with a heart rate around 44. The patient is on Inderal taking 120 mg LA tablet daily. The patient has been on this medication for quite a number of years. The patient takes Imitrex if needed for her migraine. She returns to this office for an evaluation.  REVIEW OF SYSTEMS: Out of a complete 14 system review of symptoms, the patient complains only of the following symptoms, and all other reviewed systems are negative.  Appetite change, fatigue, ringing in ears, loss of vision, palpitations, daytime sleepiness, joint pain, muscle cramps, dizziness, headache  ALLERGIES: Allergies  Allergen Reactions  . Iohexol      Desc: throat selling resp  distress hives.premedicate pt.entered 07/06/04, bsw.     HOME MEDICATIONS: Outpatient Prescriptions Prior to Visit  Medication Sig Dispense Refill  . aspirin 325 MG tablet Take 325 mg by mouth daily.     . cephALEXin (KEFLEX) 250 MG capsule Take 250 mg by mouth daily.      . Coenzyme Q10 (CO Q-10) 200 MG CAPS Take 1 capsule by mouth daily.      . divalproex (DEPAKOTE) 250 MG DR tablet Take 1 tablet by mouth daily for 1 week then  increase to 1 tablet by mouth twice a day. 60 tablet 3  . fish oil-omega-3 fatty acids 1000 MG capsule Take 1.2 g by mouth 2 (two) times daily.    . furosemide (LASIX) 20 MG tablet Take 1 tablet (20 mg total) by mouth daily. 90 tablet 3  . Ginkgo Biloba 120 MG CAPS Take 1 capsule by mouth daily.      Marland Kitchen levothyroxine (SYNTHROID, LEVOTHROID) 112 MCG tablet Take 112 mcg by mouth daily.    . Multiple Vitamin (MULTIVITAMIN) capsule Take 1 capsule by mouth daily.    . nitroGLYCERIN (NITROSTAT) 0.4 MG SL tablet Place 0.4 mg under the tongue every 5 (five) minutes as needed. Pain    . pantoprazole (PROTONIX) 40 MG tablet Take 1 tablet (40 mg total) by mouth 2 (two) times daily. 60 tablet 11  . potassium chloride SA (KLOR-CON M20) 20 MEQ tablet TAKE 2 TABLETS BY MOUTH TWICE A DAY AND 1/2 TABLET AT BEDTIME 135 tablet 11  . propranolol ER (INDERAL LA) 120 MG 24 hr capsule TAKE ONE CAPSULE BY MOUTH EVERY DAY AT BEDTIME 30 capsule 5  . ranitidine (ZANTAC) 300 MG tablet Take 1 tablet (300 mg total) by mouth 2 (two) times daily. 60 tablet 11  . simvastatin (ZOCOR) 40 MG tablet TAKE 1 TABLET BY MOUTH AT BEDTIME 30 tablet 9  . topiramate (TOPAMAX) 50 MG tablet Take 1 tablet  in the AM & 2 tablets in the PM for 1 week. Then to 1 tablet twice a day for 1 week, then 1 tablet daily for 1 week then stop. 50 tablet 0  . trimethoprim (TRIMPEX) 100 MG tablet Take 100 mg by mouth at bedtime.    Marland Kitchen CINNAMON PO Take 1,000 mg by mouth daily.      No facility-administered medications prior to visit.    PAST MEDICAL HISTORY: Past Medical History  Diagnosis Date  . Depression   . Anorexia   . Migraine   . GERD (gastroesophageal reflux disease)   . Seasonal allergies   . Arrhythmia   . CAD (coronary artery disease)   . Frequent UTI   . Edema   . Vitamin D deficiency   . Restless leg syndrome   . Microscopic hematuria   . Hypothyroid   . Breast cancer     ER+/PR+/Her2-    PAST SURGICAL HISTORY: Past Surgical  History  Procedure Laterality Date  . Cholecystectomy open    . Appendectomy    . Abdominal hysterectomy    . Back surgery  1997    herniated disc repair  . Cystoscopy      neg    FAMILY HISTORY: Family History  Problem Relation Age of Onset  . Coronary artery disease Mother   . Thyroid disease Mother   . Alzheimer's disease Mother   . Diabetes Mother   . Heart disease Mother   . Cancer Brother     melnoma  . Ovarian cancer Paternal Aunt  36  . Bone cancer Paternal Uncle   . Alzheimer's disease Maternal Grandmother   . Heart disease Maternal Grandfather   . Pancreatic cancer Maternal Grandfather 84  . Ovarian cancer Maternal Aunt     dx in 98s  . Throat cancer Maternal Uncle     smoker  . Lung cancer Maternal Uncle     heavy smoker  . Ovarian cancer Paternal Aunt     dx in her 74s  . Cancer Cousin     "female cancer"    SOCIAL HISTORY: History   Social History  . Marital Status: Widowed    Spouse Name: N/A    Number of Children: 1  . Years of Education: N/A   Occupational History  . not working    Social History Main Topics  . Smoking status: Never Smoker   . Smokeless tobacco: Never Used  . Alcohol Use: Yes     Comment: socially  . Drug Use: No  . Sexual Activity: Not on file   Other Topics Concern  . Not on file   Social History Narrative   Does not get any regular exercise      Doctors   -uro-Ottlein   -cardio--hochrein   -Neurology--Dr. Jannifer Franklin   -allergies--Dr. Carmelina Peal   Gyn past--Dr. Warnell Forester   Couselor--Dr. Mitchum   Endo-- Dr. Chalmers Cater      Lives with son who is 38 years old      PHYSICAL EXAM  Filed Vitals:   02/07/14 1425  BP: 110/65  Pulse: 62  Height: '5\' 8"'  (1.727 m)  Weight: 197 lb 9.6 oz (89.631 kg)   Body mass index is 30.05 kg/(m^2).  Generalized: Well developed, in no acute distress   Neurological examination  Mentation: Alert oriented to time, place, history taking. Follows all commands speech and language  fluent Cranial nerve II-XII: Pupils were equal round reactive to light. Extraocular movements were full, visual field were full on confrontational test. Facial sensation and strength were normal. Uvula tongue midline. Head turning and shoulder shrug  were normal and symmetric. Motor: The motor testing reveals 5 over 5 strength of all 4 extremities except 4/5 in the left lower extremity. Mild weakness in the left ankle. Good symmetric motor tone is noted throughout.  Sensory: Sensory testing is intact to soft touch on all 4 extremities. No evidence of extinction is noted.  Coordination: Cerebellar testing reveals good finger-nose-finger and heel-to-shin bilaterally.  Gait and station: Gait is normal. Tandem gait is normal. Romberg is positive. Reflexes: Deep tendon reflexes are symmetric and normal bilaterally.    DIAGNOSTIC DATA (LABS, IMAGING, TESTING) - I reviewed patient records, labs, notes, testing and imaging myself where available.  Lab Results  Component Value Date   WBC 6.5 01/04/2014   HGB 13.5 01/04/2014   HCT 39.9 01/04/2014   MCV 90.9 01/04/2014   PLT 174 01/04/2014      Component Value Date/Time   NA 144 01/04/2014 0823   NA 142 11/18/2013 1438   K 4.2 01/04/2014 0823   K 4.2 11/18/2013 1438   CL 110 11/18/2013 1438   CO2 20* 01/04/2014 0823   CO2 22 11/18/2013 1438   GLUCOSE 183* 01/04/2014 0823   GLUCOSE 116* 11/18/2013 1438   BUN 21.3 01/04/2014 0823   BUN 23 11/18/2013 1438   CREATININE 1.6* 01/04/2014 0823   CREATININE 1.35* 11/18/2013 1438   CREATININE 1.3* 12/10/2011 1557   CALCIUM 10.1 01/04/2014 0823   CALCIUM 9.4 11/18/2013 1438   PROT  7.2 01/04/2014 0823   PROT 6.6 11/18/2013 1438   PROT 7.3 11/16/2013 1514   ALBUMIN 4.1 01/04/2014 0823   ALBUMIN 4.1 11/18/2013 1438   AST 22 01/04/2014 0823   AST 20 11/18/2013 1438   ALT 26 01/04/2014 0823   ALT 22 11/18/2013 1438   ALKPHOS 57 01/04/2014 0823   ALKPHOS 47 11/18/2013 1438   BILITOT 1.33*  01/04/2014 0823   BILITOT 1.4* 11/18/2013 1438   GFRNONAA 44* 06/04/2011 1750   GFRAA 51* 06/04/2011 1750   Lab Results  Component Value Date   CHOL 118 09/13/2013   HDL 37* 09/13/2013   LDLCALC 58 09/13/2013   TRIG 116 09/13/2013   CHOLHDL 3.2 09/13/2013   Lab Results  Component Value Date   HGBA1C 5.4 08/20/2012   No results found for: [id number] Lab Results  Component Value Date   TSH 0.54 12/10/2011      ASSESSMENT AND PLAN 59 y.o. year old female  has a past medical history of Depression; Anorexia; Migraine; GERD (gastroesophageal reflux disease); Seasonal allergies; Arrhythmia; CAD (coronary artery disease); Frequent UTI; Edema; Vitamin D deficiency; Restless leg syndrome; Microscopic hematuria; Hypothyroid; and Breast cancer. here with:   1. Migraines  The patient feels that her migraines have improved in severity. However she continues to have 3-4 headaches a week. The patient restarted her Topamax taking 1 tablet at bedtime. She continues taking Depakote 1 tablet at bedtime. The patient feels that Depakote has interfered with her balance. For now we will stop the Depakote. If she feels that her headaches get worse she should let us know. On exam the patient did have weakness in the left lower extremity and in the left ankle. She does confirm that she has back pain that will radiate down the left leg. She describes this as a numbness tingling feeling that happens intermittently. This could represent sciatica. The patient did have back surgery back in 1996. However at this time due to her pending surgery for breast cancer and radiation the patient would like to hold off on any further exams for the left lower extremity weakness. I have advised the patient that she will follow-up in 3 months at that time we will reevaluate the left lower extremity weakness. If the patient's symptoms worsen or she develops new symptoms she should let us know.   I spent 20 minutes with the patient  50 % of that time was spent counseling the patient about the current treatment and new left lower extremity weakness.   Ward Givens, MSN, NP-C 02/07/2014, 2:23 PM [facility] [address], [place], [place] [address] [phone]  Note: This document was prepared with digital dictation and possible smart phrase technology. Any transcriptional errors that result from this process are uni

## 2014-02-07 NOTE — Progress Notes (Signed)
Pt very nervous- She will come in for labs after seeds

## 2014-02-07 NOTE — Progress Notes (Signed)
   02/07/14 1640  OBSTRUCTIVE SLEEP APNEA  Have you ever been diagnosed with sleep apnea through a sleep study? No  Do you snore loudly (loud enough to be heard through closed doors)?  1  Do you often feel tired, fatigued, or sleepy during the daytime? 1  Has anyone observed you stop breathing during your sleep? 0  Do you have, or are you being treated for high blood pressure? 1  BMI more than 35 kg/m2? 0  Age over 59 years old? 1  Gender: 0  Obstructive Sleep Apnea Score 4  Score 4 or greater  Results sent to PCP

## 2014-02-13 ENCOUNTER — Ambulatory Visit
Admission: RE | Admit: 2014-02-13 | Discharge: 2014-02-13 | Disposition: A | Discharge status: Home / Self Care | Payer: Medicare Other | Source: Ambulatory Visit | Attending: Surgery | Admitting: Surgery

## 2014-02-13 ENCOUNTER — Encounter (HOSPITAL_BASED_OUTPATIENT_CLINIC_OR_DEPARTMENT_OTHER)
Admission: RE | Admit: 2014-02-13 | Discharge: 2014-02-13 | Disposition: A | Discharge status: Home / Self Care | Payer: Medicare Other | Source: Ambulatory Visit | Attending: Surgery | Admitting: Surgery

## 2014-02-13 DIAGNOSIS — E079 Disorder of thyroid, unspecified: Secondary | ICD-10-CM | POA: Diagnosis not present

## 2014-02-13 DIAGNOSIS — C50911 Malignant neoplasm of unspecified site of right female breast: Secondary | ICD-10-CM

## 2014-02-13 DIAGNOSIS — I4891 Unspecified atrial fibrillation: Secondary | ICD-10-CM | POA: Diagnosis not present

## 2014-02-13 DIAGNOSIS — G43909 Migraine, unspecified, not intractable, without status migrainosus: Secondary | ICD-10-CM | POA: Diagnosis not present

## 2014-02-13 DIAGNOSIS — F419 Anxiety disorder, unspecified: Secondary | ICD-10-CM | POA: Diagnosis not present

## 2014-02-13 DIAGNOSIS — K219 Gastro-esophageal reflux disease without esophagitis: Secondary | ICD-10-CM | POA: Diagnosis not present

## 2014-02-13 DIAGNOSIS — Z87891 Personal history of nicotine dependence: Secondary | ICD-10-CM | POA: Diagnosis not present

## 2014-02-13 DIAGNOSIS — I2510 Atherosclerotic heart disease of native coronary artery without angina pectoris: Secondary | ICD-10-CM | POA: Diagnosis not present

## 2014-02-13 LAB — COMPREHENSIVE METABOLIC PANEL
ALBUMIN: 4.2 g/dL (ref 3.5–5.2)
ALK PHOS: 54 U/L (ref 39–117)
ALT: 26 U/L (ref 0–35)
AST: 26 U/L (ref 0–37)
Anion gap: 10 (ref 5–15)
BILIRUBIN TOTAL: 1.3 mg/dL — AB (ref 0.3–1.2)
BUN: 14 mg/dL (ref 6–23)
CALCIUM: 9.9 mg/dL (ref 8.4–10.5)
CO2: 25 mmol/L (ref 19–32)
Chloride: 107 mEq/L (ref 96–112)
Creatinine, Ser: 1.24 mg/dL — ABNORMAL HIGH (ref 0.50–1.10)
GFR calc Af Amer: 54 mL/min — ABNORMAL LOW (ref >90)
GFR calc non Af Amer: 47 mL/min — ABNORMAL LOW (ref >90)
Glucose, Bld: 120 mg/dL — ABNORMAL HIGH (ref 70–99)
Potassium: 4.5 mmol/L (ref 3.5–5.1)
Sodium: 142 mmol/L (ref 135–145)
Total Protein: 7.4 g/dL (ref 6.0–8.3)

## 2014-02-13 LAB — CBC WITH DIFFERENTIAL/PLATELET
Basophils Absolute: 0 10*3/uL (ref 0.0–0.1)
Basophils Relative: 0 % (ref 0–1)
Eosinophils Absolute: 0.1 10*3/uL (ref 0.0–0.7)
Eosinophils Relative: 2 % (ref 0–5)
HCT: 38.9 % (ref 36.0–46.0)
Hemoglobin: 13.5 g/dL (ref 12.0–15.0)
LYMPHS PCT: 34 % (ref 12–46)
Lymphs Abs: 1.8 10*3/uL (ref 0.7–4.0)
MCH: 30.9 pg (ref 26.0–34.0)
MCHC: 34.7 g/dL (ref 30.0–36.0)
MCV: 89 fL (ref 78.0–100.0)
MONOS PCT: 9 % (ref 3–12)
Monocytes Absolute: 0.5 10*3/uL (ref 0.1–1.0)
Neutro Abs: 2.9 10*3/uL (ref 1.7–7.7)
Neutrophils Relative %: 55 % (ref 43–77)
Platelets: 169 10*3/uL (ref 150–400)
RBC: 4.37 MIL/uL (ref 3.87–5.11)
RDW: 13.1 % (ref 11.5–15.5)
WBC: 5.4 10*3/uL (ref 4.0–10.5)

## 2014-02-14 ENCOUNTER — Encounter (HOSPITAL_BASED_OUTPATIENT_CLINIC_OR_DEPARTMENT_OTHER)
Admission: RE | Disposition: A | Discharge status: Home / Self Care | Payer: Self-pay | Source: Ambulatory Visit | Attending: Surgery

## 2014-02-14 ENCOUNTER — Encounter (HOSPITAL_COMMUNITY)
Admission: RE | Admit: 2014-02-14 | Discharge: 2014-02-14 | Disposition: A | Discharge status: Home / Self Care | Payer: Medicare Other | Source: Ambulatory Visit | Attending: Surgery | Admitting: Surgery

## 2014-02-14 ENCOUNTER — Ambulatory Visit
Admission: RE | Admit: 2014-02-14 | Discharge: 2014-02-14 | Disposition: A | Discharge status: Home / Self Care | Payer: Medicare Other | Source: Ambulatory Visit | Attending: Surgery | Admitting: Surgery

## 2014-02-14 ENCOUNTER — Ambulatory Visit (HOSPITAL_BASED_OUTPATIENT_CLINIC_OR_DEPARTMENT_OTHER)
Admission: RE | Admit: 2014-02-14 | Discharge: 2014-02-14 | Disposition: A | Discharge status: Home / Self Care | Payer: Medicare Other | Source: Ambulatory Visit | Attending: Surgery | Admitting: Surgery

## 2014-02-14 ENCOUNTER — Encounter (HOSPITAL_BASED_OUTPATIENT_CLINIC_OR_DEPARTMENT_OTHER): Payer: Self-pay | Admitting: *Deleted

## 2014-02-14 ENCOUNTER — Ambulatory Visit (HOSPITAL_BASED_OUTPATIENT_CLINIC_OR_DEPARTMENT_OTHER): Payer: Medicare Other | Admitting: Anesthesiology

## 2014-02-14 DIAGNOSIS — E079 Disorder of thyroid, unspecified: Secondary | ICD-10-CM | POA: Diagnosis not present

## 2014-02-14 DIAGNOSIS — F419 Anxiety disorder, unspecified: Secondary | ICD-10-CM | POA: Diagnosis not present

## 2014-02-14 DIAGNOSIS — I2510 Atherosclerotic heart disease of native coronary artery without angina pectoris: Secondary | ICD-10-CM | POA: Diagnosis not present

## 2014-02-14 DIAGNOSIS — Z87891 Personal history of nicotine dependence: Secondary | ICD-10-CM | POA: Insufficient documentation

## 2014-02-14 DIAGNOSIS — R079 Chest pain, unspecified: Secondary | ICD-10-CM | POA: Diagnosis not present

## 2014-02-14 DIAGNOSIS — I4891 Unspecified atrial fibrillation: Secondary | ICD-10-CM | POA: Insufficient documentation

## 2014-02-14 DIAGNOSIS — K219 Gastro-esophageal reflux disease without esophagitis: Secondary | ICD-10-CM | POA: Insufficient documentation

## 2014-02-14 DIAGNOSIS — G8918 Other acute postprocedural pain: Secondary | ICD-10-CM | POA: Diagnosis not present

## 2014-02-14 DIAGNOSIS — C50911 Malignant neoplasm of unspecified site of right female breast: Secondary | ICD-10-CM | POA: Insufficient documentation

## 2014-02-14 DIAGNOSIS — N61 Inflammatory disorders of breast: Secondary | ICD-10-CM | POA: Diagnosis not present

## 2014-02-14 DIAGNOSIS — C50511 Malignant neoplasm of lower-outer quadrant of right female breast: Secondary | ICD-10-CM | POA: Diagnosis not present

## 2014-02-14 DIAGNOSIS — G43909 Migraine, unspecified, not intractable, without status migrainosus: Secondary | ICD-10-CM | POA: Insufficient documentation

## 2014-02-14 HISTORY — PX: RADIOACTIVE SEED GUIDED PARTIAL MASTECTOMY WITH AXILLARY SENTINEL LYMPH NODE BIOPSY: SHX6520

## 2014-02-14 HISTORY — DX: Anxiety disorder, unspecified: F41.9

## 2014-02-14 HISTORY — DX: Presence of coronary angioplasty implant and graft: Z95.5

## 2014-02-14 SURGERY — RADIOACTIVE SEED GUIDED PARTIAL MASTECTOMY WITH AXILLARY SENTINEL LYMPH NODE BIOPSY
Anesthesia: General | Site: Breast | Laterality: Right

## 2014-02-14 MED ORDER — PROPOFOL 10 MG/ML IV BOLUS
INTRAVENOUS | Status: DC | PRN
  Administered 2014-02-14: 160 mg via INTRAVENOUS

## 2014-02-14 MED ORDER — METHYLENE BLUE 1 % INJ SOLN
INTRAMUSCULAR | Status: AC
  Filled 2014-02-14: qty 10

## 2014-02-14 MED ORDER — MIDAZOLAM HCL 2 MG/2ML IJ SOLN
INTRAMUSCULAR | Status: AC
  Filled 2014-02-14: qty 2

## 2014-02-14 MED ORDER — BUPIVACAINE-EPINEPHRINE (PF) 0.25% -1:200000 IJ SOLN
INTRAMUSCULAR | Status: DC | PRN
  Administered 2014-02-14: 19 mL

## 2014-02-14 MED ORDER — CHLORHEXIDINE GLUCONATE 4 % EX LIQD: 1.0000 "application " | Freq: Once | CUTANEOUS | Status: DC

## 2014-02-14 MED ORDER — ONDANSETRON HCL 4 MG/2ML IJ SOLN: 4.0000 mg | Freq: Once | INTRAMUSCULAR | Status: DC | PRN

## 2014-02-14 MED ORDER — FENTANYL CITRATE 0.05 MG/ML IJ SOLN
INTRAMUSCULAR | Status: AC
  Filled 2014-02-14: qty 2

## 2014-02-14 MED ORDER — SCOPOLAMINE 1 MG/3DAYS TD PT72
MEDICATED_PATCH | TRANSDERMAL | Status: AC
  Filled 2014-02-14: qty 1

## 2014-02-14 MED ORDER — EPHEDRINE SULFATE 50 MG/ML IJ SOLN
INTRAMUSCULAR | Status: DC | PRN
  Administered 2014-02-14: 5 mg via INTRAVENOUS

## 2014-02-14 MED ORDER — ONDANSETRON HCL 4 MG/2ML IJ SOLN
INTRAMUSCULAR | Status: DC | PRN
  Administered 2014-02-14: 4 mg via INTRAVENOUS

## 2014-02-14 MED ORDER — CEFAZOLIN SODIUM-DEXTROSE 2-3 GM-% IV SOLR
2.0000 g | INTRAVENOUS | Status: AC
  Administered 2014-02-14: 2 g via INTRAVENOUS

## 2014-02-14 MED ORDER — LACTATED RINGERS IV SOLN
INTRAVENOUS | Status: DC
  Administered 2014-02-14 (×2): via INTRAVENOUS

## 2014-02-14 MED ORDER — BUPIVACAINE-EPINEPHRINE (PF) 0.25% -1:200000 IJ SOLN
INTRAMUSCULAR | Status: AC
  Filled 2014-02-14: qty 30

## 2014-02-14 MED ORDER — BUPIVACAINE-EPINEPHRINE (PF) 0.5% -1:200000 IJ SOLN
INTRAMUSCULAR | Status: DC | PRN
  Administered 2014-02-14: 25 mL via PERINEURAL

## 2014-02-14 MED ORDER — OXYCODONE HCL 5 MG PO TABS
ORAL_TABLET | ORAL | Status: AC
  Filled 2014-02-14: qty 1

## 2014-02-14 MED ORDER — DEXAMETHASONE SODIUM PHOSPHATE 4 MG/ML IJ SOLN
INTRAMUSCULAR | Status: DC | PRN
  Administered 2014-02-14: 10 mg via INTRAVENOUS

## 2014-02-14 MED ORDER — OXYCODONE HCL 5 MG PO TABS
5.0000 mg | ORAL_TABLET | Freq: Once | ORAL | Status: AC | PRN
  Administered 2014-02-14: 5 mg via ORAL

## 2014-02-14 MED ORDER — HYDROMORPHONE HCL 1 MG/ML IJ SOLN: 0.2500 mg | INTRAMUSCULAR | Status: DC | PRN

## 2014-02-14 MED ORDER — TECHNETIUM TC 99M SULFUR COLLOID FILTERED
1.0000 | Freq: Once | INTRAVENOUS | Status: AC | PRN
  Administered 2014-02-14: 1 via INTRADERMAL

## 2014-02-14 MED ORDER — CEFAZOLIN SODIUM-DEXTROSE 2-3 GM-% IV SOLR
INTRAVENOUS | Status: AC
  Filled 2014-02-14: qty 50

## 2014-02-14 MED ORDER — LIDOCAINE HCL (CARDIAC) 20 MG/ML IV SOLN
INTRAVENOUS | Status: DC | PRN
  Administered 2014-02-14: 50 mg via INTRAVENOUS

## 2014-02-14 MED ORDER — MIDAZOLAM HCL 2 MG/2ML IJ SOLN
1.0000 mg | INTRAMUSCULAR | Status: DC | PRN
  Administered 2014-02-14: 2 mg via INTRAVENOUS

## 2014-02-14 MED ORDER — SODIUM CHLORIDE 0.9 % IJ SOLN
INTRAMUSCULAR | Status: AC
  Filled 2014-02-14: qty 10

## 2014-02-14 MED ORDER — FENTANYL CITRATE 0.05 MG/ML IJ SOLN
INTRAMUSCULAR | Status: DC | PRN
  Administered 2014-02-14: 50 ug via INTRAVENOUS

## 2014-02-14 MED ORDER — FENTANYL CITRATE 0.05 MG/ML IJ SOLN
50.0000 ug | INTRAMUSCULAR | Status: DC | PRN
  Administered 2014-02-14: 100 ug via INTRAVENOUS

## 2014-02-14 MED ORDER — OXYCODONE-ACETAMINOPHEN 5-325 MG PO TABS: 1.0000 | ORAL_TABLET | ORAL | Status: DC | PRN

## 2014-02-14 MED ORDER — OXYCODONE HCL 5 MG/5ML PO SOLN: 5.0000 mg | Freq: Once | ORAL | Status: AC | PRN

## 2014-02-14 MED ORDER — PHENYLEPHRINE HCL 10 MG/ML IJ SOLN
INTRAMUSCULAR | Status: DC | PRN
  Administered 2014-02-14 (×2): 40 ug via INTRAVENOUS

## 2014-02-14 SURGICAL SUPPLY — 56 items
APPLIER CLIP 9.375 MED OPEN (MISCELLANEOUS)
BINDER BREAST LRG (GAUZE/BANDAGES/DRESSINGS) ×3 IMPLANT
BINDER BREAST MEDIUM (GAUZE/BANDAGES/DRESSINGS) IMPLANT
BINDER BREAST XLRG (GAUZE/BANDAGES/DRESSINGS) IMPLANT
BINDER BREAST XXLRG (GAUZE/BANDAGES/DRESSINGS) IMPLANT
BLADE SURG 15 STRL LF DISP TIS (BLADE) ×2 IMPLANT
BLADE SURG 15 STRL SS (BLADE) ×4
CANISTER SUCT 1200ML W/VALVE (MISCELLANEOUS) ×3 IMPLANT
CHLORAPREP W/TINT 26ML (MISCELLANEOUS) ×3 IMPLANT
CLIP APPLIE 9.375 MED OPEN (MISCELLANEOUS) IMPLANT
COVER BACK TABLE 60X90IN (DRAPES) ×3 IMPLANT
COVER MAYO STAND STRL (DRAPES) ×3 IMPLANT
COVER PROBE W GEL 5X96 (DRAPES) ×3 IMPLANT
DECANTER SPIKE VIAL GLASS SM (MISCELLANEOUS) IMPLANT
DEVICE DISSECT PLASMABLAD 3.0S (MISCELLANEOUS) IMPLANT
DRAIN CHANNEL 19F RND (DRAIN) IMPLANT
DRAPE LAPAROSCOPIC ABDOMINAL (DRAPES) IMPLANT
DRAPE UTILITY XL STRL (DRAPES) ×3 IMPLANT
ELECT COATED BLADE 2.86 ST (ELECTRODE) ×3 IMPLANT
ELECT REM PT RETURN 9FT ADLT (ELECTROSURGICAL) ×3
ELECTRODE REM PT RTRN 9FT ADLT (ELECTROSURGICAL) ×1 IMPLANT
EVACUATOR SILICONE 100CC (DRAIN) IMPLANT
GLOVE BIO SURGEON STRL SZ7 (GLOVE) ×3 IMPLANT
GLOVE BIOGEL PI IND STRL 7.5 (GLOVE) ×1 IMPLANT
GLOVE BIOGEL PI IND STRL 8 (GLOVE) ×2 IMPLANT
GLOVE BIOGEL PI INDICATOR 7.5 (GLOVE) ×2
GLOVE BIOGEL PI INDICATOR 8 (GLOVE) ×4
GLOVE ECLIPSE 8.0 STRL XLNG CF (GLOVE) ×3 IMPLANT
GLOVE SURG SS PI 8.0 STRL IVOR (GLOVE) ×3 IMPLANT
GOWN STRL REUS W/ TWL LRG LVL3 (GOWN DISPOSABLE) ×3 IMPLANT
GOWN STRL REUS W/TWL LRG LVL3 (GOWN DISPOSABLE) ×6
HEMOSTAT SNOW SURGICEL 2X4 (HEMOSTASIS) ×3 IMPLANT
LIQUID BAND (GAUZE/BANDAGES/DRESSINGS) ×3 IMPLANT
NDL SAFETY ECLIPSE 18X1.5 (NEEDLE) IMPLANT
NEEDLE HYPO 18GX1.5 SHARP (NEEDLE)
NEEDLE HYPO 25X1 1.5 SAFETY (NEEDLE) ×3 IMPLANT
NS IRRIG 1000ML POUR BTL (IV SOLUTION) ×3 IMPLANT
PACK BASIN DAY SURGERY FS (CUSTOM PROCEDURE TRAY) ×3 IMPLANT
PENCIL BUTTON HOLSTER BLD 10FT (ELECTRODE) ×3 IMPLANT
PIN SAFETY STERILE (MISCELLANEOUS) IMPLANT
PLASMABLADE 3.0S (MISCELLANEOUS)
SLEEVE SCD COMPRESS KNEE MED (MISCELLANEOUS) ×3 IMPLANT
SPONGE LAP 4X18 X RAY DECT (DISPOSABLE) ×6 IMPLANT
STAPLER VISISTAT 35W (STAPLE) IMPLANT
SUT ETHILON 2 0 FS 18 (SUTURE) IMPLANT
SUT MNCRL AB 4-0 PS2 18 (SUTURE) ×3 IMPLANT
SUT SILK 2 0 SH (SUTURE) IMPLANT
SUT VIC AB 3-0 SH 27 (SUTURE)
SUT VIC AB 3-0 SH 27X BRD (SUTURE) IMPLANT
SUT VICRYL 3-0 CR8 SH (SUTURE) ×3 IMPLANT
SYR CONTROL 10ML LL (SYRINGE) ×3 IMPLANT
TOWEL OR 17X24 6PK STRL BLUE (TOWEL DISPOSABLE) ×3 IMPLANT
TOWEL OR NON WOVEN STRL DISP B (DISPOSABLE) ×3 IMPLANT
TUBE CONNECTING 20'X1/4 (TUBING) ×1
TUBE CONNECTING 20X1/4 (TUBING) ×2 IMPLANT
YANKAUER SUCT BULB TIP NO VENT (SUCTIONS) ×3 IMPLANT

## 2014-02-14 NOTE — Transfer of Care (Signed)
Immediate Anesthesia Transfer of Care Note  Patient: [name] [name]  Procedure(s) Performed: Procedure(s): RADIOACTIVE SEED GUIDED PARTIAL MASTECTOMY WITH AXILLARY SENTINEL LYMPH NODE BIOPSY (Right)  Patient Location: PACU  Anesthesia Type:General and GA combined with regional for post-op pain  Level of Consciousness: sedated  Airway & Oxygen Therapy: Patient Spontanous Breathing and Patient connected to face mask oxygen  Post-op Assessment: Report given to PACU RN and Post -op Vital signs reviewed and stable  Post vital signs: Reviewed and stable  Complications: No apparent anesthesia complications

## 2014-02-14 NOTE — Anesthesia Preprocedure Evaluation (Signed)
Anesthesia Evaluation  Patient identified by MRN, date of birth, ID band Patient awake    Reviewed: Allergy & Precautions, NPO status , Patient's Chart, lab work & pertinent test results  Airway Mallampati: II  TM Distance: >3 FB Neck ROM: Full    Dental  (+) Teeth Intact, Dental Advisory Given   Pulmonary  breath sounds clear to auscultation        Cardiovascular + CAD (Stent functioning well.  Sees Dr. Percival Spanish) Rhythm:Regular Rate:Normal     Neuro/Psych    GI/Hepatic GERD-  Medicated and Controlled,  Endo/Other    Renal/GU Renal InsufficiencyRenal disease     Musculoskeletal   Abdominal   Peds  Hematology   Anesthesia Other Findings   Reproductive/Obstetrics                             Anesthesia Physical Anesthesia Plan  ASA: III  Anesthesia Plan: General   Post-op Pain Management:    Induction: Intravenous  Airway Management Planned: LMA  Additional Equipment:   Intra-op Plan:   Post-operative Plan: Extubation in OR  Informed Consent: I have reviewed the patients History and Physical, chart, labs and discussed the procedure including the risks, benefits and alternatives for the proposed anesthesia with the patient or authorized representative who has indicated his/her understanding and acceptance.   Dental advisory given  Plan Discussed with: CRNA, Anesthesiologist and Surgeon  Anesthesia Plan Comments:         Anesthesia Quick Evaluation

## 2014-02-14 NOTE — Anesthesia Procedure Notes (Addendum)
Procedure Name: LMA Insertion Date/Time: 02/14/2014 7:32 AM Performed by: Lieutenant Diego Pre-anesthesia Checklist: Patient identified, Emergency Drugs available, Suction available and Patient being monitored Patient Re-evaluated:Patient Re-evaluated prior to inductionOxygen Delivery Method: Circle System Utilized Preoxygenation: Pre-oxygenation with 100% oxygen Intubation Type: IV induction Ventilation: Mask ventilation without difficulty LMA: LMA inserted LMA Size: 4.0 Number of attempts: 1 Airway Equipment and Method: Bite block Placement Confirmation: positive ETCO2 and breath sounds checked- equal and bilateral Tube secured with: Tape Dental Injury: Teeth and Oropharynx as per pre-operative assessment    Anesthesia Regional Block:  Pectoralis block  Pre-Anesthetic Checklist: ,, timeout performed, Correct Patient, Correct Site, Correct Laterality, Correct Procedure, Correct Position, site marked, Risks and benefits discussed,  Surgical consent,  Pre-op evaluation,  At surgeon's request and post-op pain management  Laterality: Right and Upper  Prep: chloraprep       Needles:  Injection technique: Single-shot  Needle Type: Echogenic Needle     Needle Length: 9cm 9 cm Needle Gauge: 21 and 21 G    Additional Needles:  Procedures: ultrasound guided (picture in chart) Pectoralis block Narrative:  Start time: 02/14/2014 7:10 AM End time: 02/14/2014 7:15 AM Injection made incrementally with aspirations every 5 mL.  Performed by: Personally  Anesthesiologist: Rosalie, [place]

## 2014-02-14 NOTE — Progress Notes (Signed)
Assisted Dr. Crews with right, ultrasound guided, pectoralis block. Side rails up, monitors on throughout procedure. See vital signs in flow sheet. Tolerated Procedure well. 

## 2014-02-14 NOTE — Progress Notes (Signed)
Emotional support during breast injections °

## 2014-02-14 NOTE — H&P (Signed)
H&P   [name] [name] (MR# [id number])      H&P Info    Chief Strategy Officer Note Status Last Update User Last Update Date/Time   Erroll Luna, MD Signed Erroll Luna, MD 01/04/2014 11:01 AM    H&P    Expand All Collapse All   [name] [name] 01/04/2014 9:05 AM Location: [facility] Patient #: [id number] DOB: 01/15/1956 Undefined / Language: [name] [name] / Race: Undefined Female History of Present Illness [name] [name] A. Desmen Schoffstall MD; 01/04/2014 11:00 AM) Patient words: RIGHT BREAST CANCER PT PRESENTS WITH 7 MM RIGHT BREAST CANCER. NO SXS. PICKED up ON MAMMOGRAM. DENIES PAIN OR NIPPLE DISCHARGE.          CLINICAL DATA: Screening callback for questioned right breast mass  EXAM: ULTRASOUND OF THE right BREAST  COMPARISON: Prior exams  FINDINGS: On physical exam, I palpate no abnormality in the right breast 9 o'clock location.  Ultrasound is performed, showing an irregular shadowing hypoechoic mass right breast 9 o'clock location 4 cm from the nipple measuring 7 x 7 x 7 mm. This corresponds to the mammographic abnormality.  There is no right axillary lymphadenopathy.  IMPRESSION: Suspicious right breast mass 9 o'clock location. Ultrasound-guided core biopsy will be performed today and dictated separately.  RECOMMENDATION: Ultrasound-guided right breast biopsy  I have discussed the findings and recommendations with the patient. Results were also provided in writing at the conclusion of the visit. If applicable, a reminder letter will be sent to the patient regarding the next appointment.  BI-RADS CATEGORY 4: Suspicious.   Electronically Signed By: [name] [name] M.D. On: 12/29/2013 15:59      Diagnosis Breast, right, needle core biopsy, mass, 9 o'clock - INVASIVE DUCTAL CARCINOMA. - SEE COMMENT. Microscopic Comment Although definitive grading of breast carcinoma is best done on excision, the features of the invasive tumor from the 9 o'clock mass  are compatible with a grade I-II breast carcinoma. Breast prognostic markers will be performed and reported in an addendum. Findings are called to the [name] [name] on 12/30/13. [name] [name] has seen this case in consultation with agreement. (RAH:gt, 12/30/13) [name] [name].  The patient is a 59 year old female   Other Problems [name] [name]; 01/04/2014 9:05 AM) Anxiety Disorder Atrial Fibrillation Back Pain Bladder Problems Breast Cancer Chest pain Cholelithiasis Gastroesophageal Reflux Disease General anesthesia - complications Hepatitis Lump In Breast Migraine Headache Oophorectomy Bilateral. Thyroid Disease  Past Surgical History [name] [name]; 01/04/2014 9:05 AM) Appendectomy Breast Biopsy Right. Gallbladder Surgery - Open Hysterectomy (not due to cancer) - Complete Oral Surgery Spinal Surgery - Lower Back  Diagnostic Studies History [name] [name]; 01/04/2014 9:05 AM) Colonoscopy 1-5 years ago Mammogram within last year  Social History [name] [name]; 01/04/2014 9:05 AM) Alcohol use Occasional alcohol use. Caffeine use Coffee. No drug use Tobacco use Former smoker.  Family History [name] [name]; 01/04/2014 9:05 AM) Alcohol Abuse Brother. Cerebrovascular Accident Mother. Depression Mother. Diabetes Mellitus Mother. Heart Disease Mother. Heart disease in female family member before age 71 Heart disease in female family member before age 5 Hypertension Mother. Melanoma Brother. Migraine Headache Father. Thyroid problems Mother.  Pregnancy / Birth History [name] [name]; 01/04/2014 9:05 AM) Age at menarche 32 years. Contraceptive History Oral contraceptives. Gravida 3 Irregular periods Maternal age 34-20 Para 1     Review of Systems [name] [name]; 01/04/2014 9:05 AM) General Present- Appetite Loss, Chills, Fatigue and Weight Loss. Not Present- Fever, Night Sweats and Weight Gain. Skin  Present- Dryness. Not Present- Change in Wart/Mole, Hives,  Jaundice, New Lesions, Non-Healing Wounds, Rash and Ulcer. HEENT Present- Hearing Loss, Hoarseness, Oral Ulcers, Ringing in the Ears, Seasonal Allergies, Sinus Pain, Sore Throat and Wears glasses/contact lenses. Not Present- Earache, Nose Bleed, Visual Disturbances and Yellow Eyes. Respiratory Not Present- Bloody sputum, Chronic Cough, Difficulty Breathing, Snoring and Wheezing. Breast Not Present- Breast Mass, Breast Pain, Nipple Discharge and Skin Changes. Cardiovascular Present- Chest Pain, Leg Cramps, Palpitations, Rapid Heart Rate and Shortness of Breath. Not Present- Difficulty Breathing Lying Down and Swelling of Extremities. Gastrointestinal Present- Abdominal Pain and Indigestion. Not Present- Bloating, Bloody Stool, Change in Bowel Habits, Chronic diarrhea, Constipation, Difficulty Swallowing, Excessive gas, Gets full quickly at meals, Hemorrhoids, Nausea, Rectal Pain and Vomiting. Female Genitourinary Present- Urgency. Not Present- Frequency, Nocturia, Painful Urination and Pelvic Pain. Musculoskeletal Present- Back Pain. Not Present- Joint Pain, Joint Stiffness, Muscle Pain, Muscle Weakness and Swelling of Extremities. Neurological Present- Headaches. Not Present- Decreased Memory, Fainting, Numbness, Seizures, Tingling, Tremor, Trouble walking and Weakness. Psychiatric Present- Anxiety, Change in Sleep Pattern, Fearful and Frequent crying. Not Present- Bipolar and Depression. Endocrine Present- Cold Intolerance and Hair Changes. Not Present- Excessive Hunger, Heat Intolerance, Hot flashes and New Diabetes.   Physical Exam ([name] [name] A. Yaris Ferrell MD; 01/04/2014 10:57 AM)  General Mental Status-Alert. General Appearance-Consistent with stated age. Hydration-Well hydrated. Voice-Normal.  Head and Neck Head-normocephalic, atraumatic with no lesions or palpable masses. Trachea-midline. Thyroid Gland Characteristics -  normal size and consistency.  Eye Eyeball - Bilateral-Extraocular movements intact. Sclera/Conjunctiva - Bilateral-No scleral icterus.  Chest and Lung Exam Chest and lung exam reveals -quiet, even and easy respiratory effort with no use of accessory muscles and on auscultation, normal breath sounds, no adventitious sounds and normal vocal resonance. Inspection Chest Wall - Normal. Back - normal.  Breast Breast - Left-Symmetric, Non Tender, No Biopsy scars, no Dimpling, No Inflammation, No Lumpectomy scars, No Mastectomy scars, No Peau d' Orange. Breast - Right-Symmetric, Non Tender, No Biopsy scars, no Dimpling, No Inflammation, No Lumpectomy scars, No Mastectomy scars, No Peau d' Orange. Breast Lump-No Palpable Breast Mass.  Cardiovascular Cardiovascular examination reveals -normal heart sounds, regular rate and rhythm with no murmurs and normal pedal pulses bilaterally.  Abdomen Inspection Inspection of the abdomen reveals - No Hernias. Skin - Scar - no surgical scars. Palpation/Percussion Palpation and Percussion of the abdomen reveal - Soft, Non Tender, No Rebound tenderness, No Rigidity (guarding) and No hepatosplenomegaly. Auscultation Auscultation of the abdomen reveals - Bowel sounds normal.  Neurologic Neurologic evaluation reveals -alert and oriented x 3 with no impairment of recent or remote memory. Mental Status-Normal.  Musculoskeletal Normal Exam - Left-Upper Extremity Strength Normal and Lower Extremity Strength Normal. Normal Exam - Right-Upper Extremity Strength Normal and Lower Extremity Strength Normal.  Lymphatic Head & Neck  General Head & Neck Lymphatics: Bilateral - Description - Normal. Axillary  General Axillary Region: Bilateral - Description - Normal. Tenderness - Non Tender. Femoral & Inguinal  Generalized Femoral & Inguinal Lymphatics: Bilateral - Description - Normal. Tenderness - Non Tender.    Assessment & Plan  (Jie Stickels A. Starlette Thurow MD; 01/04/2014 10:59 AM)  BREAST CANCER, RIGHT (174.9  C50.911) Impression: pt desires breast conservation. Risk of lumpectomy include bleeding, infection, seroma, more surgery, use of seed/wire, wound care, cosmetic deformity and the need for other treatments, death , blood clots, death. Pt agrees to proceed. Risk of sentinel lymph node mapping include bleeding, infection, lymphedema, shoulder pain. stiffness, dye allergy. cosmetic deformity , blood clots, death, need for more surgery. Pt agres to  proceed. discussed mastectomy and reconstruction as well.  Current Plans Referred to Genetic Counseling, for evaluation and follow up (Medical Genetics).

## 2014-02-14 NOTE — Discharge Instructions (Signed)
[facility] °Office Phone Number [phone] ° °BREAST BIOPSY/ PARTIAL MASTECTOMY: POST OP INSTRUCTIONS ° °Always review your discharge instruction sheet given to you by the facility where your surgery was performed. ° °IF YOU HAVE DISABILITY OR FAMILY LEAVE FORMS, YOU MUST BRING THEM TO THE OFFICE FOR PROCESSING.  DO NOT GIVE THEM TO YOUR DOCTOR. ° °1. A prescription for pain medication may be given to you upon discharge.  Take your pain medication as prescribed, if needed.  If narcotic pain medicine is not needed, then you may take acetaminophen (Tylenol) or ibuprofen (Advil) as needed. °2. Take your usually prescribed medications unless otherwise directed °3. If you need a refill on your pain medication, please contact your pharmacy.  They will contact our office to request authorization.  Prescriptions will not be filled after 5pm or on week-ends. °4. You should eat very light the first 24 hours after surgery, such as soup, crackers, pudding, etc.  Resume your normal diet the day after surgery. °5. Most patients will experience some swelling and bruising in the breast.  Ice packs and a good support bra will help.  Swelling and bruising can take several days to resolve.  °6. It is common to experience some constipation if taking pain medication after surgery.  Increasing fluid intake and taking a stool softener will usually help or prevent this problem from occurring.  A mild laxative (Milk of Magnesia or Miralax) should be taken according to package directions if there are no bowel movements after 48 hours. °7. Unless discharge instructions indicate otherwise, you may remove your bandages 24-48 hours after surgery, and you may shower at that time.  You may have steri-strips (small skin tapes) in place directly over the incision.  These strips should be left on the skin for 7-10 days.  If your surgeon used skin glue on the incision, you may shower in 24 hours.  The glue will flake off over the  next 2-3 weeks.  Any sutures or staples will be removed at the office during your follow-up visit. °8. ACTIVITIES:  You may resume regular daily activities (gradually increasing) beginning the next day.  Wearing a good support bra or sports bra minimizes pain and swelling.  You may have sexual intercourse when it is comfortable. °a. You may drive when you no longer are taking prescription pain medication, you can comfortably wear a seatbelt, and you can safely maneuver your car and apply brakes. °b. RETURN TO WORK:  ______________________________________________________________________________________ °9. You should see your doctor in the office for a follow-up appointment approximately two weeks after your surgery.  Your doctor’s nurse will typically make your follow-up appointment when she calls you with your pathology report.  Expect your pathology report 2-3 business days after your surgery.  You may call to check if you do not hear from us after three days. °10. OTHER INSTRUCTIONS: _______________________________________________________________________________________________ _____________________________________________________________________________________________________________________________________ °_____________________________________________________________________________________________________________________________________ °_____________________________________________________________________________________________________________________________________ ° °WHEN TO CALL YOUR DOCTOR: °1. Fever over 101.0 °2. Nausea and/or vomiting. °3. Extreme swelling or bruising. °4. Continued bleeding from incision. °5. Increased pain, redness, or drainage from the incision. ° °The clinic staff is available to answer your questions during regular business hours.  Please don’t hesitate to call and ask to speak to one of the nurses for clinical concerns.  If you have a medical emergency, go to the nearest  emergency room or call 911.  A surgeon from [facility] is always on call at the hospital. ° °For further questions, please visit [email or web address]  ° ° ° °  Post Anesthesia Home Care Instructions  Activity: Get plenty of rest for the remainder of the day. A responsible adult should stay with you for 24 hours following the procedure.  For the next 24 hours, DO NOT: -Drive a car -Paediatric nurse -Drink alcoholic beverages -Take any medication unless instructed by your physician -Make any legal decisions or sign important papers.  Meals: Start with liquid foods such as gelatin or soup. Progress to regular foods as tolerated. Avoid greasy, spicy, heavy foods. If nausea and/or vomiting occur, drink only clear liquids until the nausea and/or vomiting subsides. Call your physician if vomiting continues.  Special Instructions/Symptoms: Your throat may feel dry or sore from the anesthesia or the breathing tube placed in your throat during surgery. If this causes discomfort, gargle with warm salt water. The discomfort should disappear within 24 hours.   Call your surgeon if you experience:   1.  Fever over 101.0. 2.  Inability to urinate. 3.  Nausea and/or vomiting. 4.  Extreme swelling or bruising at the surgical site. 5.  Continued bleeding from the incision. 6.  Increased pain, redness or drainage from the incision. 7.  Problems related to your pain medication. 8. Any change in color, movement and/or sensation 9. Any problems and/or concerns

## 2014-02-14 NOTE — Interval H&P Note (Signed)
History and Physical Interval Note:  02/14/2014 7:06 AM  [name] [name]  has presented today for surgery, with the diagnosis of Right breast cancer  The various methods of treatment have been discussed with the patient and family. After consideration of risks, benefits and other options for treatment, the patient has consented to  Procedure(s): RADIOACTIVE SEED GUIDED PARTIAL MASTECTOMY WITH AXILLARY SENTINEL LYMPH NODE BIOPSY (Right) as a surgical intervention .  The patient's history has been reviewed, patient examined, no change in status, stable for surgery.  I have reviewed the patient's chart and labs.  Questions were answered to the patient's satisfaction.     [name]

## 2014-02-14 NOTE — Anesthesia Postprocedure Evaluation (Signed)
  Anesthesia Post-op Note  Patient: [name] [name]  Procedure(s) Performed: Procedure(s): RADIOACTIVE SEED GUIDED PARTIAL MASTECTOMY WITH AXILLARY SENTINEL LYMPH NODE BIOPSY (Right)  Patient Location: PACU  Anesthesia Type: General   Level of Consciousness: awake, alert  and oriented  Airway and Oxygen Therapy: Patient Spontanous Breathing  Post-op Pain: mild  Post-op Assessment: Post-op Vital signs reviewed  Post-op Vital Signs: Reviewed  Last Vitals:  Filed Vitals:   02/14/14 1008  BP: 133/56  Pulse: 81  Temp: 36.6 C  Resp: 16    Complications: No apparent anesthesia complications

## 2014-02-14 NOTE — Op Note (Signed)
[name] [name], BERRIAN NO.:  [id number]  MEDICAL RECORD NO.:  [id number]  LOCATION:                                 FACILITY:  PHYSICIAN:  Jaela Yepez A. Collyns Mcquigg, M.D.DATE OF BIRTH:  06-01-1955  DATE OF PROCEDURE:  02/14/2014 DATE OF DISCHARGE:                              OPERATIVE REPORT   PREOPERATIVE DIAGNOSIS:  Stage I right breast cancer.  POSTOPERATIVE DIAGNOSIS:  Stage I right breast cancer.  PROCEDURE: 1. Right breast seed localized lumpectomy. 2. Right axillary sentinel lymph node mapping using technetium sulfur     colloid.  SURGEON:  Marcello Moores A. Quinton Voth, MD  ANESTHESIA:  LMA with pectoral block per Anesthesia and 0.25% Sensorcaine with epinephrine for local.  EBL:  Minimum.  SPECIMENS: 1. Right breast mass. 2. Four right axillary sentinel nodes to pathology.  DRAINS:  None.  IV FLUIDS:  600 mL crystalloid.  INDICATIONS FOR PROCEDURE:  The patient is a pleasant 59 year old female found to have stage I right breast cancer by mammography, MRI and core biopsy.  We discussed options of breast conservation versus mastectomy reconstruction.  She wished to conserve her breast.  Discussed the need of radiation therapy and chemotherapy as well.  Risks, benefits, and alternative therapies were discussed with the patient as well as long- term expectations which were outlined in my history and physical.  She understood the above and agreed to proceed.  DESCRIPTION OF PROCEDURE:  The patient was met in the holding area, and the right breast was marked as correct side.  Neoprobe was used to verify seed location in the right breast and adequacy of technetium injection.  Questions were answered.  She was taken back to the operating room and placed supine on the OR table.  After induction of general anesthesia, the right breast was prepped and draped in sterile fashion.  Time-out was done.  Neoprobe was used to identify the hot spot in the right breast to  correspond to the seed.  Curvilinear incision was made in the right lateral breast in the upper outer quadrant over the seed site.  We excised all tissue around the seed with grossly negative margins.  Radiograph revealed the seeding clip to be in the specimen in the center.  I took an additional medial margin as well since it was felt closed.  Cavity was irrigated and made hemostatic.  We placed clips in the cavity to mark it for radiation therapy.  We then closed the cavity with 3-0 Vicryl and 4-0 Monocryl.  Neoprobe was then changed to technetium settings, and the right axilla was interrogated.  The hot spot was identified.  Incision was made over that in the right axilla.  Dissection was carried down into the right axillary lymph node basin.  We excised 4 level 1 nodes from this basin that were hot.  Background counts then approached to zero.  We then irrigated the cavity.  Surgicel snow was placed in the cavity and then closed with 3-0 Vicryl and 4-0 Monocryl.  The long thoracic nerve, thoracodorsal trunk were identified and preserved.  After closure of both wounds, Dermabond was applied.  All final counts of sponge, needle, and instruments were  found to be correct in this portion of the case. Binder was placed.  The patient was awoke, extubated, taken to recovery in satisfactory condition.     Gunnison Chahal A. Melbourne Jakubiak, M.D.     TAC/MEDQ  D:  02/14/2014  T:  02/14/2014  Job:  [id number]

## 2014-02-14 NOTE — Brief Op Note (Signed)
02/14/2014  8:39 AM  PATIENT:  [name] [name]  59 y.o. female  PRE-OPERATIVE DIAGNOSIS:  Right breast cancer  POST-OPERATIVE DIAGNOSIS:  Right breast cancer  PROCEDURE:  Procedure(s): RADIOACTIVE SEED GUIDED PARTIAL MASTECTOMY WITH AXILLARY SENTINEL LYMPH NODE BIOPSY (Right)  SURGEON:  Surgeon(s) and Role:    * Erroll Luna, MD - Primary      ANESTHESIA:   local, regional and general  EBL:  Total I/O In: 1400 [I.V.:1400] Out: -   BLOOD ADMINISTERED:none  DRAINS: none   LOCAL MEDICATIONS USED:  BUPIVICAINE   SPECIMEN:  Source of Specimen:  right breast and right axillary lymph nodes x4  DISPOSITION OF SPECIMEN:  PATHOLOGY  COUNTS:  YES  TOURNIQUET:  * No tourniquets in log *  DICTATION: .Other Dictation: Dictation Number [id number]  PLAN OF CARE: Discharge to home after PACU  PATIENT DISPOSITION:  PACU - hemodynamically stable.   Delay start of Pharmacological VTE agent (>24hrs) due to surgical blood loss or risk of bleeding: not applicable

## 2014-02-15 ENCOUNTER — Encounter (HOSPITAL_BASED_OUTPATIENT_CLINIC_OR_DEPARTMENT_OTHER): Payer: Self-pay | Admitting: Surgery

## 2014-02-16 ENCOUNTER — Telehealth: Payer: Self-pay | Admitting: Genetic Counselor

## 2014-02-16 ENCOUNTER — Encounter: Payer: Self-pay | Admitting: Genetic Counselor

## 2014-02-16 NOTE — Telephone Encounter (Signed)
Revealed negative genetic testing, but that there is an ATM VUS. 

## 2014-02-16 NOTE — Progress Notes (Signed)
HPI: [name] was previously seen in the [facility] due to a personal and family history of cancer and concerns regarding a hereditary predisposition to cancer. Please refer to our prior cancer genetics clinic note for more information regarding [name] [name]'s medical, social and family histories, and our assessment and recommendations, at the time. [name] [name] recent genetic test results were disclosed to her, as were recommendations warranted by these results. These results and recommendations are discussed in more detail below.  GENETIC TEST RESULTS: At the time of [name] [name]'s visit, we recommended she pursue genetic testing of the CancerNext gene panel. The CancerNext gene panel offered by [employer] and includes sequencing and rearrangement analysis for the following 32 genes:   APC, ATM, BARD1, BMPR1A, BRCA1, BRCA2, BRIP1, CDH1, CDK4, CDKN2A, CHEK2, EPCAM, GREM1, MLH1, MRE11A, MSH2, MSH6, MUTYH, NBN, NF1, PALB2, PMS2, POLD1, POLE, PTEN, RAD50, RAD51D, SMAD4, SMARCA4, STK11, and TP53.  The report date is February 13, 2014.  Testing was performed at [facility]. Genetic testing was normal, and did not reveal a deleterious mutation in these genes. The test report has been scanned into EPIC and is located under the Media tab.   We discussed with [name] [name] that since the current genetic testing is not perfect, it is possible there may be a gene mutation in one of these genes that current testing cannot detect, but that chance is small. We also discussed, that it is possible that another gene that has not yet been discovered, or that we have not yet tested, is responsible for the cancer diagnoses in the family, and it is, therefore, important to remain in touch with cancer genetics in the future so that we can continue to offer [name] [name] the most up to date genetic testing.   CANCER SCREENING RECOMMENDATIONS: This result is reassuring and suggests that Ms.  [name]'s personal and family history of cancer was most likely not due to an inherited predisposition associated with one of these genes. Most cancers happen by chance and this negative test, along with details of her family history, suggests that her cancer falls into this category. We, therefore, recommended she continue to follow the cancer management and screening guidelines provided by her oncology and primary providers.   RECOMMENDATIONS FOR FAMILY MEMBERS: Women in this family might be at some increased risk of developing cancer, over the general population risk, simply due to the family history of cancer. We recommended women in this family have a yearly mammogram beginning at age 60, or 70 years younger than the earliest onset of cancer, an an annual clinical breast exam, and perform monthly breast self-exams. Women in this family should also have a gynecological exam as recommended by their primary provider. All family members should have a colonoscopy by age 18.  FOLLOW-UP: Lastly, we discussed with [name] [name] that cancer genetics is a rapidly advancing field and it is possible that new genetic tests will be appropriate for her and/or her family members in the future. We encouraged her to remain in contact with cancer genetics on an annual basis so we can update her personal and family histories and let her know of advances in cancer genetics that may benefit this family.   Our contact number was provided. [name] [name] questions were answered to her satisfaction, and she knows she is welcome to call us at anytime with additional questions or concerns.   Roma Kayser, MS, [facility] Certified Genetic Counselor [email or web address]

## 2014-02-20 ENCOUNTER — Encounter: Payer: Self-pay | Admitting: *Deleted

## 2014-02-20 NOTE — Progress Notes (Signed)
Ordered oncotype per Dr. Burr Medico.  Faxed requisition to Pathology and confirmed receipt with [facility].

## 2014-02-27 DIAGNOSIS — C50411 Malignant neoplasm of upper-outer quadrant of right female breast: Secondary | ICD-10-CM | POA: Diagnosis not present

## 2014-02-28 ENCOUNTER — Encounter (HOSPITAL_COMMUNITY): Payer: Self-pay

## 2014-03-01 ENCOUNTER — Encounter: Payer: Self-pay | Admitting: *Deleted

## 2014-03-03 ENCOUNTER — Other Ambulatory Visit (INDEPENDENT_AMBULATORY_CARE_PROVIDER_SITE_OTHER): Payer: Self-pay | Admitting: Surgery

## 2014-03-03 ENCOUNTER — Other Ambulatory Visit: Payer: Self-pay | Admitting: Neurology

## 2014-03-03 NOTE — Progress Notes (Signed)
Location of Breast Cancer:Right upper-outer breast  Histology per Pathology Report:02/14/2014 Diagnosis 1. Breast, lumpectomy, Right - INVASIVE GRADE II DUCTAL CARCINOMA SPANNING 0.9 CM IN GREATEST DIMENSION. - ASSOCIATED INTERMEDIATE GRADE DUCTAL CARCINOMA IN SITU. - MARGINS ARE NEGATIVE. - SEE ONCOLOGY TEMPLATE. 2. Breast, excision, Right - BENIGN BREAST PARENCHYMA WITH SCATTERED ACUTE AND CHRONIC INFLAMMATION. - NO ATYPIA, HYPERPLASIA, OR MALIGNANCY IDENTIFIED. 3. Lymph node, sentinel, biopsy, Right axillary - ONE BENIGN LYMPH NODE WITH NO TUMOR SEEN (0/1). 4. Lymph node, sentinel, biopsy, Right axillary - ONE BENIGN LYMPH NODE WITH NO TUMOR SEEN (0/1). 5. Lymph node, sentinel, biopsy, Right axillary - ONE BENIGN LYMPH NODE WITH NO TUMOR SEEN (0/1). 6. Lymph node, sentinel, biopsy, Right axillary - ONE BENIGN LYMPH NODE WITH NO TUMOR SEEN (0/1). 7. Lymph node, sentinel, biopsy, Right axillary - ONE BENIGN LYMPH NODE WITH NO TUMOR SEEN (0/1).  Receptor Status: ER(+), PR (+), Her2-neu (-)  Did patient present with symptoms (if so, please note symptoms) or was this found on screening mammography?: found on screening.mammogram 12/30/14.  Past/Anticipated interventions by surgeon, if any:02/14/2014:RADIOACTIVE SEED GUIDED PARTIAL MASTECTOMY WITH AXILLARY SENTINEL LYMPH NODE BIOPSY  Past/Anticipated interventions by medical oncology, if any: Chemotherapy  Lymphedema issues, if any:No  Pain issues, if any:  Mild discomfort of right axilla.  SAFETY ISSUES:  Prior radiation?No  Pacemaker/ICD? No  Possible current pregnancy?post-menopausal.total hysterectomy.  Is the patient on methotrexate? No  Current Complaints / other details: Menarche age 29.GXP1, one and only child at age 46 or 61 HRT 75 to 42 years.Hysterectomy in 1983.    Arlyss Repress, RN 03/03/2014,3:08 PM

## 2014-03-05 NOTE — Progress Notes (Signed)
[place]  [phone] Fax:([phone]) [place] Note   Patient Care Team: Abner Greenspan, MD as PCP - General Minus Breeding, MD as Consulting Physician (Cardiology) Trinda Pascal, NP as Nurse Practitioner (Nurse Practitioner) Erroll Luna, MD as Consulting Physician (General Surgery) Thea Silversmith, MD as Consulting Physician (Radiation Oncology) Truitt Merle, MD as Consulting Physician (Hematology) 03/06/2014  CHIEF COMPLAINTS/PURPOSE OF CONSULTATION:  Diagnosis stage IA right breast cancer, pT1bN0    Breast cancer of upper-outer quadrant of right female breast   12/29/2013 Imaging Screening mammogram and [place]: an irregular shadowing hypoechoic mass right breast 9 o'clock location 4 cm from the nipple measuring 7 x 7 x 7 mm. No adenopathy    12/29/2013 Initial Diagnosis Breast cancer of upper-outer quadrant of right female breast   12/29/2013 Initial Biopsy Grade I-II IDA, ER+/PR+/HER2-, Ki67 10%    02/14/2014 Surgery Right lumpectomy; grade 2 IDC spanning 0.9 cm with associated grade 2 DCIS.  5 sentinel lymph nodes negative.  HER2 repeated; results pending.    02/14/2014 Pathologic Stage pT1cpN0; Stage IA   02/14/2014 Oncotype testing RS 21, no adjuvant chemo offered.      HISTORY OF PRESENTING ILLNESS:  [name] [name] 59 y.o. female presents to our breast multidisciplinary clinic today because of newly diagnosed breast cancer.   This was discovered by routine screening mammogram a few weeks ago. She underwent diagnostic mammogram and ultrasound on 12/29/2013 which showed an irregular shadowing hypoechoic mass in right breast likely position measuring 7 mm. No adenopathy. She underwent core needle biopsy on the same day, which showed grade 1-2 invasive ductal adenocarcinoma, ER positive PR positive and HER-2 negative, Ki-67 10%.  She has been feeling very anxious since the biopsy. She does have history of coronary artery disease, status  post stent placement in [facility] 7. Since the breast cancer diagnosis, she has had several episodes of anxiety attack, hypoventilation, with chest tightness. She appeared quite nervous when I met her first in the clinic. Before the biopsy, she felt well in general, she does have intermittent migraine headaches, chronic leg swollen, intermittent chest pain, denies any other new symptoms, no weight loss or other changes.  She lives with her only son, who is 59 years old, on disability due to depression and suicidal ideas. She is his caregiver.  INTERIM HISTORY: She underwent right lumpectomy on 02/14/2014. She has mild pain at axillar She takes vicodin for the pain, she feels well otherwise. She has concerns about her hypothyroid, and complains about hair loss, fatigue and hirtunisum. She has been seeing her endocrinologist for that.  MEDICAL HISTORY:  Past Medical History  Diagnosis Date  . Depression   . Anorexia   . Migraine   . GERD (gastroesophageal reflux disease)   . Seasonal allergies   . Arrhythmia   . CAD (coronary artery disease), s/p stent placement  2007   . Frequent UTI   . Edema   . Vitamin D deficiency   . Restless leg syndrome   . Microscopic hematuria   . Hypothyroid   She has psychologist Dr. Rica Mote   SURGICAL HISTORY: Past Surgical History  Procedure Laterality Date  . Cholecystectomy open    . Appendectomy    . Abdominal hysterectomy  1983  . Back surgery  1997    herniated disc repair  . Cystoscopy      neg  Back surgery in 1997   SOCIAL HISTORY: History   Social History  . Marital  Status: Widowed, husband died of colon cancer in 74     Spouse Name: N/A    Number of Children: One son  31 yo, on disability for depression, who lives with her   . Years of Education: N/A   Occupational History  . Secretary, Clinical cytogeneticist, retired     Social History Main Topics  . Smoking status: Former Smoker    Start date: 10/14/1971  . Smokeless tobacco: Never Used    . Alcohol Use: Yes     Comment: socially  . Drug Use: No  . Sexual Activity: Not on file   Other Topics Concern  . Not on file   Social History Narrative   Does not get any regular exercise      Doctors   -uro-Ottlein   -cardio--hochrein   -Neurology--Dr. Jannifer Franklin   -allergies--Dr. Carmelina Peal   Gyn past--Dr. Warnell Forester   Couselor--Dr. Mitchum   Endo-- Dr. Chalmers Cater      Lives with son who is 16 years old    FAMILY HISTORY: Family History  Problem Relation Age of Onset  . Coronary artery disease Mother   . Thyroid disease Mother   . Alzheimer's disease Mother   . Diabetes Mother   . Heart disease Mother   . Cancer Brother     melnoma  . Ovarian cancer Paternal Aunt 19  . Bone cancer Paternal Uncle   . Alzheimer's disease Maternal Grandmother   . Heart disease Maternal Grandfather   . Pancreatic cancer Maternal Grandfather 22  . Ovarian cancer Maternal Aunt     dx in 85s  . Throat cancer Maternal Uncle     smoker  . Lung cancer Maternal Uncle     heavy smoker  . Ovarian cancer Paternal Aunt     dx in her 45s  . Cancer Cousin     "female cancer"   GYN HISTORY  Menarchal: 10 LMP: 1983 hysrectomy  Contraceptive: 2 years  HRT: was on for 10-20 years, off several years  GxP1: several miscarriages     ALLERGIES:  is allergic to iohexol.  MEDICATIONS:  Current Outpatient Prescriptions  Medication Sig Dispense Refill  . aspirin 325 MG tablet Take 325 mg by mouth daily.     . cephALEXin (KEFLEX) 250 MG capsule Take 250 mg by mouth daily.      Marland Kitchen CINNAMON PO Take 1,000 mg by mouth daily.     . Coenzyme Q10 (CO Q-10) 200 MG CAPS Take 1 capsule by mouth daily.      . divalproex (DEPAKOTE) 250 MG DR tablet Take 1 tablet by mouth daily for 1 week then increase to 1 tablet by mouth twice a day. 60 tablet 3  . fish oil-omega-3 fatty acids 1000 MG capsule Take 1.2 g by mouth 2 (two) times daily.    . furosemide (LASIX) 20 MG tablet Take 1 tablet (20 mg total) by mouth daily. 90  tablet 3  . Ginkgo Biloba 120 MG CAPS Take 1 capsule by mouth daily.      Marland Kitchen levothyroxine (SYNTHROID, LEVOTHROID) 112 MCG tablet Take 112 mcg by mouth daily.    . Multiple Vitamin (MULTIVITAMIN) capsule Take 1 capsule by mouth daily.    . nitroGLYCERIN (NITROSTAT) 0.4 MG SL tablet Place 0.4 mg under the tongue every 5 (five) minutes as needed. Pain    . pantoprazole (PROTONIX) 40 MG tablet Take 1 tablet (40 mg total) by mouth 2 (two) times daily. 60 tablet 11  . potassium chloride SA (  KLOR-CON M20) 20 MEQ tablet TAKE 2 TABLETS BY MOUTH TWICE A DAY AND 1/2 TABLET AT BEDTIME 135 tablet 11  . propranolol ER (INDERAL LA) 120 MG 24 hr capsule TAKE ONE CAPSULE BY MOUTH EVERY DAY AT BEDTIME 30 capsule 5  . ranitidine (ZANTAC) 300 MG tablet Take 1 tablet (300 mg total) by mouth 2 (two) times daily. 60 tablet 11  . simvastatin (ZOCOR) 40 MG tablet TAKE 1 TABLET BY MOUTH AT BEDTIME 30 tablet 9  .      . topiramate (TOPAMAX) 50 MG tablet Take 1 tablet  in the AM & 2 tablets in the PM for 1 week. Then to 1 tablet twice a day for 1 week, then 1 tablet daily for 1 week then stop. 50 tablet 0  . trimethoprim (TRIMPEX) 100 MG tablet Take 100 mg by mouth at bedtime.     No current facility-administered medications for this visit.    REVIEW OF SYSTEMS:   Constitutional: Denies fevers, chills or abnormal night sweats Eyes: Denies blurriness of vision, double vision or watery eyes Ears, nose, mouth, throat, and face: Denies mucositis or sore throat Respiratory: Denies cough, dyspnea or wheezes Cardiovascular: (+) palpitation and intermittent chest pain, (+) lower extremity swelling Gastrointestinal:  Denies nausea, heartburn or change in bowel habits Skin: Denies abnormal skin rashes Lymphatics: Denies new lymphadenopathy or easy bruising Neurological:Denies numbness, tingling or new weaknesses Behavioral/Psych: (+) very anxious All other systems were reviewed with the patient and are negative.  PHYSICAL  EXAMINATION: ECOG PERFORMANCE STATUS: 0 - Asymptomatic  Filed Vitals:   03/06/14 0909  BP: 110/58  Pulse: 63  Temp: 97.8 F (36.6 C)  Resp: 18   Filed Weights   03/06/14 0909  Weight: 199 lb 6.4 oz (90.447 kg)    GENERAL:alert, no distress and comfortable SKIN: skin color, texture, turgor are normal, no rashes or significant lesions EYES: normal, conjunctiva are pink and non-injected, sclera clear OROPHARYNX:no exudate, no erythema and lips, buccal mucosa, and tongue normal  NECK: supple, thyroid normal size, non-tender, without nodularity LYMPH:  no palpable lymphadenopathy in the cervical, axillary or inguinal LUNGS: clear to auscultation and percussion with normal breathing effort HEART: regular rate & rhythm and no murmurs and no lower extremity edema ABDOMEN:abdomen soft, non-tender and normal bowel sounds Musculoskeletal:no cyanosis of digits and no clubbing  PSYCH: alert & oriented x 3 with fluent speech NEURO: no focal motor/sensory deficits Breasts: Breast inspection showed them to be symmetrical with no nipple discharge. Palpation of the breasts and axilla revealed no obvious mass that I could appreciate. A small skin bruise at her biopsy site of the right breast.   LABORATORY DATA:  I have reviewed the data as listed Lab Results  Component Value Date   WBC 5.4 02/13/2014   HGB 13.5 02/13/2014   HCT 38.9 02/13/2014   MCV 89.0 02/13/2014   PLT 169 02/13/2014    Recent Labs  11/16/13 1514 11/18/13 1438 01/04/14 0823 02/13/14 1100  NA  --  142 144 142  K  --  4.2 4.2 4.5  CL  --  110  --  107  CO2  --  22 20* 25  GLUCOSE  --  116* 183* 120*  BUN  --  23 21.3 14  CREATININE  --  1.35* 1.6* 1.24*  CALCIUM  --  9.4 10.1 9.9  GFRNONAA  --   --   --  48*  GFRAA  --   --   --  54*  PROT 7.3 6.6 7.2 7.4  ALBUMIN  --  4.1 4.1 4.2  AST '26 20 22 26  ' ALT '28 22 26 26  ' ALKPHOS 54 47 57 54  BILITOT 1.3* 1.4* 1.33* 1.3*  BILIDIR 0.29  --   --   --     PATHOLOGY REPORT 12/30/2013 Diagnosis 1. Breast, lumpectomy, Right 02/14/2014 - INVASIVE GRADE II DUCTAL CARCINOMA SPANNING 0.9 CM IN GREATEST DIMENSION. - ASSOCIATED INTERMEDIATE GRADE DUCTAL CARCINOMA IN SITU. - MARGINS ARE NEGATIVE. - SEE ONCOLOGY TEMPLATE. 2. Breast, excision, Right - BENIGN BREAST PARENCHYMA WITH SCATTERED ACUTE AND CHRONIC INFLAMMATION. - NO ATYPIA, HYPERPLASIA, OR MALIGNANCY IDENTIFIED. 3. Lymph node, sentinel, biopsy, Right axillary - ONE BENIGN LYMPH NODE WITH NO TUMOR SEEN (0/1). 4. Lymph node, sentinel, biopsy, Right axillary - ONE BENIGN LYMPH NODE WITH NO TUMOR SEEN (0/1). 5. Lymph node, sentinel, biopsy, Right axillary - ONE BENIGN LYMPH NODE WITH NO TUMOR SEEN (0/1). 6. Lymph node, sentinel, biopsy, Right axillary - ONE BENIGN LYMPH NODE WITH NO TUMOR SEEN (0/1). 7. Lymph node, sentinel, biopsy, Right axillary - ONE BENIGN LYMPH NODE WITH NO TUMOR SEEN (0/1). 1. BREAST, INVASIVE TUMOR, WITH LYMPH NODES PRESENT Specimen, including laterality and lymph node sampling (sentinel, non-sentinel): Right partial breast with right axillary sentinel lymph node sampling. Procedure: Right breast lumpectomy with right axillary sentinel lymph node mapping and biopsy. Histologic type: Invasive ductal carcinoma. Grade: II. Tubule formation: 3. Nuclear pleomorphism: 2. Mitotic:1. Tumor size (gross measurement and microscopic measurement): 0.9 cm. 1 of 4 FINAL for [name] [name], [name] [name] ([id number]) Microscopic Comment(continued) Margins: Invasive, distance to closest margin: 0.4 cm (medial margin). In-situ, distance to closest margin: At least 0.4 cm (medial margin). Lymphovascular invasion: Definitive lymph/vascular invasion is not identified. Ductal carcinoma in situ: Yes, present. Grade: Intermediate grade. Extensive intraductal component: No. Lobular neoplasia: Not identified. Tumor focality: Unifocal. Treatment effect: Not applicable. Extent of tumor: Tumor  confined to breast parenchyma. Skin: Not received. Nipple: Not received. Skeletal muscle: Not received. Lymph nodes: Examined: 5 Sentinel. 5 Non-sentinel. 5 Total. Lymph nodes with metastasis: 0. Isolated tumor cells (< 0.2 mm): 0. Micrometastasis: (> 0.2 mm and < 2.0 mm): 0. Macrometastasis: (> 2.0 mm): 0. Extracapsular extension: Not applicable. Breast prognostic profile: Performed on previous biopsy ([id number]) Estrogen receptor: 98%, positive. Progesterone receptor: 81%, positive. Her 2 neu by CISH: 1.24 ratio, not amplified. Ki-67: 15% Non-neoplastic breast: Fat necrosis. TNM: pT1b, pN0.  ONCOTYPE DX RS score: 21    RADIOGRAPHIC STUDIES: I have personally reviewed the radiological images as listed and agreed with the findings in the report.  US Breast Ltd Uni Right Inc Axilla 12/29/2013      FINDINGS: On physical exam, I palpate no abnormality in the right breast 9 o'clock location.  Ultrasound is performed, showing an irregular shadowing hypoechoic mass right breast 9 o'clock location 4 cm from the nipple measuring 7 x 7 x 7 mm. This corresponds to the mammographic abnormality.  There is no right axillary lymphadenopathy.  IMPRESSION: Suspicious right breast mass 9 o'clock location. Ultrasound-guided core biopsy will be performed today and dictated separately.  RECOMMENDATION: Ultrasound-guided right breast biopsy  I have discussed the findings and recommendations with the patient. Results were also provided in writing at the conclusion of the visit. If applicable, a reminder letter will be sent to the patient regarding the next appointment.  BI-RADS CATEGORY  4: Suspicious.   Electronically Signed   By: Conchita Paris M.D.   On: 12/29/2013 15:59  Mm Screening Breast Tomo Bilateral 12/08/2013      FINDINGS: In the right breast, a possible mass warrants further evaluation with ultrasound. In the left breast, no findings suspicious for malignancy. Images were processed with  CAD.   IMPRESSION: Further evaluation is suggested for possible mass in the right breast.  RECOMMENDATION: Ultrasound of the right breast. (Code:[id number]-[id number])  The patient will be contacted regarding the findings, and additional imaging will be scheduled.  BI-RADS CATEGORY  0: Incomplete. Need additional imaging evaluation and/or prior mammograms for comparison.   Electronically Signed   By: Curlene Dolphin M.D.   On: 12/08/2013 15:50     ASSESSMENT & PLAN:  59 year old postmenopausal woman, with past medical history of coronary disease, anxiety and depression, who was found by screening mammogram to have a T1b N0 M0 stage I a invasive ductal carcinoma, grade I-II, ER/PR strongly positive, HER-2 negative, Ki-67 10%.  1.  pT1b N0 M0 stage IA invasive ductal carcinoma of right breast, grade II, ER/PR strongly positive, HER-2 negative -I reviewed her surgical pathology results and Oncotype DX test result in great details. Her tumor has been completely resected. Based on the Oncotype DX results. She has risks of breast cancer recurrence about 13% in 10 years with adjuvant tamoxifen. The recurrence score is intermediate, the benefit of chemotherapy is controversial in this range, and I do not recommend adjuvant chemotherapy. Patient voiced good understanding, and agrees to proceed. -Based on her strong ER/PR positivity in the tumor, I recommend adjuvant hormonal therapy, to reduce her risks of cancer recurrence. She is 70, had hysterectomy long time ago. I'll check her hormonal level before her starts adjuvant therapy to see if she is postmenopausal. If it is, I recommend an aromatase inhibitor, such as anastrozole. Benefit and the potential side effects were discussed with patient, she agrees to proceed.  -She is going to follow-up with radiation oncologist Dr. Pablo Ledger this week, and start adjuvant irradiation soon.  Plan - I'll see her back in 5-6 weeks, to finalize her adjuvant hormonal therapy.  All  questions were answered. The patient knows to call the clinic with any problems, questions or concerns. I spent 20 minutes counseling the patient face to face. The total time spent in the appointment was 30 minutes and more than 50% was on counseling.     Truitt Merle, MD 03/06/2014 9:42 AM

## 2014-03-06 ENCOUNTER — Encounter: Payer: Self-pay | Admitting: Hematology

## 2014-03-06 ENCOUNTER — Ambulatory Visit (HOSPITAL_BASED_OUTPATIENT_CLINIC_OR_DEPARTMENT_OTHER): Payer: Medicare Other | Admitting: Hematology

## 2014-03-06 ENCOUNTER — Telehealth: Payer: Self-pay | Admitting: Hematology

## 2014-03-06 ENCOUNTER — Telehealth: Payer: Self-pay | Admitting: Adult Health

## 2014-03-06 VITALS — BP 110/58 | HR 63 | Temp 97.8°F | Resp 18 | Ht 68.0 in | Wt 199.4 lb

## 2014-03-06 DIAGNOSIS — C50811 Malignant neoplasm of overlapping sites of right female breast: Secondary | ICD-10-CM

## 2014-03-06 DIAGNOSIS — Z170 Estrogen receptor positive status [ER+]: Secondary | ICD-10-CM

## 2014-03-06 DIAGNOSIS — C50411 Malignant neoplasm of upper-outer quadrant of right female breast: Secondary | ICD-10-CM

## 2014-03-06 NOTE — Telephone Encounter (Signed)
printed sched done by [facility]

## 2014-03-07 ENCOUNTER — Encounter: Payer: Self-pay | Admitting: Cardiology

## 2014-03-07 ENCOUNTER — Ambulatory Visit (INDEPENDENT_AMBULATORY_CARE_PROVIDER_SITE_OTHER): Payer: Medicare Other | Admitting: Cardiology

## 2014-03-07 VITALS — BP 132/70 | HR 64 | Ht 68.0 in | Wt 199.1 lb

## 2014-03-07 DIAGNOSIS — R002 Palpitations: Secondary | ICD-10-CM | POA: Diagnosis not present

## 2014-03-07 MED ORDER — PROPRANOLOL HCL 60 MG PO TABS: 60.0000 mg | ORAL_TABLET | Freq: Every day | ORAL | Status: DC

## 2014-03-07 NOTE — Progress Notes (Signed)
HPI The patient presents for followup of her known coronary disease. Since I last saw her she has been diagnosed with breast cancer and has had surgery for this and is to undergo radiation. She also was taken off of her beta blocker by her neurologist. She's had some bradycardia but has never been symptomatic with this. She's being treated with Botox instead. She says she actually felt better taking the beta blocker. She feels like she hyperventilates and gets some chest discomfort. Her heart rate has been higher than she likes sometimes in the rest. She's not having any presyncope or syncope. She does have some more anxiety related to the new diagnosis in her baseline.  She's not describing any new shortness of breath although she occasionally gets this. She's not having any PND or orthopnea.  Allergies  Allergen Reactions  . Iohexol      Desc: throat selling resp distress hives.premedicate pt.entered 07/06/04, bsw.     Current Outpatient Prescriptions  Medication Sig Dispense Refill  . aspirin 325 MG tablet Take 325 mg by mouth daily.     . cephALEXin (KEFLEX) 250 MG capsule Take 250 mg by mouth daily.      Marland Kitchen CINNAMON PO Take 1,000 mg by mouth daily.     . Coenzyme Q10 (CO Q-10) 200 MG CAPS Take 1 capsule by mouth daily.      . fish oil-omega-3 fatty acids 1000 MG capsule Take 1.2 g by mouth 2 (two) times daily.    . furosemide (LASIX) 20 MG tablet Take 1 tablet (20 mg total) by mouth daily. 90 tablet 3  . Ginkgo Biloba 120 MG CAPS Take 1 capsule by mouth daily.      Marland Kitchen levothyroxine (SYNTHROID, LEVOTHROID) 112 MCG tablet Take 112 mcg by mouth daily.    . Multiple Vitamin (MULTIVITAMIN) capsule Take 1 capsule by mouth daily.    . nitroGLYCERIN (NITROSTAT) 0.4 MG SL tablet Place 0.4 mg under the tongue every 5 (five) minutes as needed. Pain    . oxyCODONE-acetaminophen (ROXICET) 5-325 MG per tablet Take 1-2 tablets by mouth every 4 (four) hours as needed. 30 tablet 0  . pantoprazole  (PROTONIX) 40 MG tablet Take 1 tablet (40 mg total) by mouth 2 (two) times daily. 60 tablet 11  . potassium chloride SA (KLOR-CON M20) 20 MEQ tablet TAKE 2 TABLETS BY MOUTH TWICE A DAY AND 1/2 TABLET AT BEDTIME 135 tablet 11  . ranitidine (ZANTAC) 300 MG tablet Take 1 tablet (300 mg total) by mouth 2 (two) times daily. 60 tablet 11  . simvastatin (ZOCOR) 40 MG tablet TAKE 1 TABLET BY MOUTH AT BEDTIME 30 tablet 9  . topiramate (TOPAMAX) 100 MG tablet Take 100 mg by mouth every evening.    . trimethoprim (TRIMPEX) 100 MG tablet Take 100 mg by mouth at bedtime.     No current facility-administered medications for this visit.    Past Medical History  Diagnosis Date  . Depression   . Anorexia   . Migraine   . GERD (gastroesophageal reflux disease)   . Seasonal allergies   . Arrhythmia   . CAD (coronary artery disease)   . Frequent UTI   . Edema   . Vitamin D deficiency   . Restless leg syndrome   . Microscopic hematuria   . Hypothyroid   . Breast cancer     ER+/PR+/Her2-  . Wears glasses   . Anxiety   . Stented coronary artery     Past Surgical  History  Procedure Laterality Date  . Cholecystectomy open    . Appendectomy    . Abdominal hysterectomy    . Back surgery  1997    herniated disc repair  . Cystoscopy      neg  . Cardiac catheterization  2007    stent  . Radioactive seed guided mastectomy with axillary sentinel lymph node biopsy Right 02/14/2014    Procedure: RADIOACTIVE SEED GUIDED PARTIAL MASTECTOMY WITH AXILLARY SENTINEL LYMPH NODE BIOPSY;  Surgeon: Erroll Luna, MD;  Location: [facility];  Service: General;  Laterality: Right;    ROS:  As stated in the HPI and negative for all other systems.  PHYSICAL EXAM BP 132/70 mmHg  Pulse 64  Ht '5\' 8"'  (1.727 m)  Wt 199 lb 1.6 oz (90.311 kg)  BMI 30.28 kg/m2 GENERAL:  Well appearing NECK:  No jugular venous distention, waveform within normal limits, carotid upstroke brisk and symmetric, no bruits, no  thyromegaly LUNGS:  Clear to auscultation bilaterally BACK:  No CVA tenderness CHEST:  Unremarkable HEART:  PMI not displaced or sustained,S1 and S2 within normal limits, no S3, no S4, no clicks, no rubs, no murmurs ABD:  Flat, positive bowel sounds normal in frequency in pitch, no bruits, no rebound, no guarding, no midline pulsatile mass, no hepatomegaly, no splenomegaly EXT:  2 plus pulses throughout, no edema, no cyanosis no clubbing   EKG: sinus bradycardia, rate 64, leftward axis, no acute ST-T wave changes.  03/07/2014  ASSESSMENT AND PLAN  Chest pain - The patient has some atypical chest pain. Some of this is related to anxiety and being off of her beta blocker. I think she feel better taking this and so I'll start long-acting propranolol 60 mg.  Palpitations - She has some rapid heart rates. This will be treated as above.

## 2014-03-07 NOTE — Patient Instructions (Signed)
Your physician recommends that you schedule a follow-up appointment in: 6 months with Dr. Hochrein  

## 2014-03-08 ENCOUNTER — Ambulatory Visit
Admission: RE | Admit: 2014-03-08 | Discharge: 2014-03-08 | Disposition: A | Discharge status: Home / Self Care | Payer: Medicare Other | Source: Ambulatory Visit | Attending: Radiation Oncology | Admitting: Radiation Oncology

## 2014-03-08 ENCOUNTER — Encounter: Payer: Self-pay | Admitting: Radiation Oncology

## 2014-03-08 VITALS — BP 129/67 | HR 63 | Temp 98.0°F | Wt 200.1 lb

## 2014-03-08 DIAGNOSIS — R208 Other disturbances of skin sensation: Secondary | ICD-10-CM | POA: Diagnosis not present

## 2014-03-08 DIAGNOSIS — Z9011 Acquired absence of right breast and nipple: Secondary | ICD-10-CM | POA: Insufficient documentation

## 2014-03-08 DIAGNOSIS — C50411 Malignant neoplasm of upper-outer quadrant of right female breast: Secondary | ICD-10-CM | POA: Diagnosis not present

## 2014-03-08 DIAGNOSIS — Z170 Estrogen receptor positive status [ER+]: Secondary | ICD-10-CM | POA: Insufficient documentation

## 2014-03-08 DIAGNOSIS — M79621 Pain in right upper arm: Secondary | ICD-10-CM | POA: Insufficient documentation

## 2014-03-08 DIAGNOSIS — Z510 Encounter for antineoplastic radiation therapy: Secondary | ICD-10-CM | POA: Insufficient documentation

## 2014-03-08 DIAGNOSIS — Z90710 Acquired absence of both cervix and uterus: Secondary | ICD-10-CM | POA: Diagnosis not present

## 2014-03-08 NOTE — Telephone Encounter (Signed)
[facility] is calling for a refill for Depakote DR 250mg  1 twice daily. Pharmacy has sent a request. Thank you.

## 2014-03-08 NOTE — Progress Notes (Signed)
Please see the Nurse Progress Note in the MD Initial Consult Encounter for this patient. 

## 2014-03-08 NOTE — Telephone Encounter (Signed)
Last OV note says: The patient feels that Depakote has interfered with her balance. For now we will stop the Depakote. If she feels that her headaches get worse she should let us know I tried to cal the patient, got no answer.  I called the pharmacy back.  Cyril Mourning said they will try reaching the patient as well.

## 2014-03-08 NOTE — Progress Notes (Signed)
   Department of Radiation Oncology  Phone:  [phone] Fax:        [phone]   Name: [name] [name] MRN: [id number]  DOB: 1955/06/29  Date: 03/08/2014  Follow Up Visit Note  Diagnosis: Breast cancer of upper-outer quadrant of right female breast   Staging form: Breast, AJCC 7th Edition     Clinical stage from 01/04/2014: Stage IA (T1b, N0, M0) - Unsigned     Pathologic stage from 02/14/2014: Stage IA (T1b, N0, cM0) - Unsigned  Interval History: [name] [name] presents today for routine followup.  She had her surgery on 1/9 and was found to have a 0.9 cm cancer with negative margins and 5negative lymph nodes. Her oncotype was intermecdiate and she elected not to do chemotherapy. She presents today for my opinion regarding the role of radiation in the management of her disease  Physical Exam:  Filed Vitals:   03/08/14 1415  BP: 129/67  Pulse: 63  Temp: 98 F (36.7 C)  Weight: 200 lb 1.6 oz (90.765 kg)   Healing incision on the outer right breast. Suture on the lateral edge of SLN scar. No signs of infection.   IMPRESSION: [name] [name] is a 59 y.o. female s/p breast conservation, now ready for radiation.   PLAN:  I spoke to the patient today regarding her diagnosis and options for treatment. We discussed the equivalence in terms of survival and local failure between mastectomy and breast conservation. We discussed the role of radiation in decreasing local failures in patients who undergo lumpectomy. We discussed the process of simulation and the placement tattoos. We discussed 4-6 weeks of treatment as an outpatient. We discussed the possibility of asymptomatic lung damage. We discussed the low likelihood of secondary malignancies. We discussed the possible side effects including but not limited to skin redness, fatigue, permanent skin darkening, and breast swelling.   She signed informed consent. She was scheduled for simulation next Tuesday. We discussed anti estrogen therapy after radiation.    I have scheduled her for simulation next week. She will meet with Dr. Burr Medico to discuss AI after that.     Thea Silversmith, MD

## 2014-03-08 NOTE — Addendum Note (Signed)
Encounter addended by: Arlyss Repress, RN on: 03/08/2014  3:57 PM<BR>     Documentation filed: Charges VN

## 2014-03-08 NOTE — Addendum Note (Signed)
Encounter addended by: Arlyss Repress, RN on: 03/08/2014  3:55 PM<BR>     Documentation filed: Arn Medal VN

## 2014-03-13 ENCOUNTER — Encounter: Payer: Self-pay | Admitting: Cardiology

## 2014-03-13 NOTE — Telephone Encounter (Signed)
I tried to reach patient again.  Got no answer.  Left message.

## 2014-03-14 ENCOUNTER — Encounter: Payer: Self-pay | Admitting: *Deleted

## 2014-03-14 ENCOUNTER — Ambulatory Visit
Admission: RE | Admit: 2014-03-14 | Discharge: 2014-03-14 | Disposition: A | Discharge status: Home / Self Care | Payer: Medicare Other | Source: Ambulatory Visit | Attending: Radiation Oncology | Admitting: Radiation Oncology

## 2014-03-14 DIAGNOSIS — M79621 Pain in right upper arm: Secondary | ICD-10-CM | POA: Diagnosis not present

## 2014-03-14 DIAGNOSIS — Z9011 Acquired absence of right breast and nipple: Secondary | ICD-10-CM | POA: Diagnosis not present

## 2014-03-14 DIAGNOSIS — Z510 Encounter for antineoplastic radiation therapy: Secondary | ICD-10-CM | POA: Diagnosis not present

## 2014-03-14 DIAGNOSIS — C50411 Malignant neoplasm of upper-outer quadrant of right female breast: Secondary | ICD-10-CM | POA: Diagnosis not present

## 2014-03-14 DIAGNOSIS — Z170 Estrogen receptor positive status [ER+]: Secondary | ICD-10-CM | POA: Diagnosis not present

## 2014-03-14 DIAGNOSIS — Z90710 Acquired absence of both cervix and uterus: Secondary | ICD-10-CM | POA: Diagnosis not present

## 2014-03-14 NOTE — Progress Notes (Signed)
Radiation Oncology         [phone] ________________________________  Name: [name] [name]      MRN: [id number]          Date: 03/14/2014              DOB: [birth year]  Optical Surface Tracking Plan:  Since intensity modulated radiotherapy (IMRT) and 3D conformal radiation treatment methods are predicated on accurate and precise positioning for treatment, intrafraction motion monitoring is medically necessary to ensure accurate and safe treatment delivery.  The ability to quantify intrafraction motion without excessive ionizing radiation dose can only be performed with optical surface tracking. Accordingly, surface imaging offers the opportunity to obtain 3D measurements of patient position throughout IMRT and 3D treatments without excessive radiation exposure.  I am ordering optical surface tracking for this patient's upcoming course of radiotherapy. ________________________________ Signature   Reference:   Ursula Alert, J, et al. Surface imaging-based analysis of intrafraction motion for breast radiotherapy patients.Journal of [place], n. 6, nov. 2014. ISSN [id number].   Available at: <http://www.jacmp.org/index.php/jacmp/article/view/4957>.

## 2014-03-14 NOTE — Progress Notes (Signed)
Name: [name] [name]   MRN: [id number]  Date:  03/14/2014  DOB: 27-Mar-1955  Status:outpatient    DIAGNOSIS: Breast cancer.  CONSENT VERIFIED: yes   SET UP: Patient is setup supine   IMMOBILIZATION:  The following immobilization was used:Custom Moldable Pillow, breast board.   NARRATIVE: [name] was brought to the [facility].  Identity was confirmed.  All relevant records and images related to the planned course of therapy were reviewed.  Then, the patient was positioned in a stable reproducible clinical set-up for radiation therapy.  Wires were placed to delineate the clinical extent of breast tissue. A wire was placed on the scar as well.  CT images were obtained.  An isocenter was placed. Skin markings were placed.  The CT images were loaded into the planning software where the target and avoidance structures were contoured.  The radiation prescription was entered and confirmed. The patient was discharged in stable condition and tolerated simulation well.    TREATMENT PLANNING NOTE:  Treatment planning then occurred. I have requested : MLC's, isodose plan, basic dose calculation  I personally designed and supervised the construction of 3 medically necessary complex treatment devices for the protection of critical normal structures including the lungs and contralateral breast as well as the immobilization device which is necessary for set up certainty.   3D simulation occurred. I requested and analyzed a dose volume histogram of the heart, lungs and lumpectomy cavity.

## 2014-03-15 ENCOUNTER — Encounter: Payer: Self-pay | Admitting: Neurology

## 2014-03-15 ENCOUNTER — Telehealth: Payer: Self-pay | Admitting: Cardiology

## 2014-03-15 ENCOUNTER — Ambulatory Visit (INDEPENDENT_AMBULATORY_CARE_PROVIDER_SITE_OTHER): Payer: Medicare Other | Admitting: Neurology

## 2014-03-15 VITALS — BP 100/58 | HR 56 | Ht 68.0 in | Wt 205.0 lb

## 2014-03-15 DIAGNOSIS — G43719 Chronic migraine without aura, intractable, without status migrainosus: Secondary | ICD-10-CM | POA: Insufficient documentation

## 2014-03-15 MED ORDER — ONABOTULINUMTOXINA 100 UNITS IJ SOLR
200.0000 [IU] | Freq: Once | INTRAMUSCULAR | Status: AC
  Administered 2014-03-15: 200 [IU] via INTRAMUSCULAR

## 2014-03-15 NOTE — Telephone Encounter (Signed)
°  1. Which medications need to be refilled? Nitroglycerin  2. Which [phone]  3. Do they need a 30 day or 90 day supply? ? Whatever amount insurance will cover  4. Would they like a call back once the medication has been sent to the pharmacy? no

## 2014-03-15 NOTE — Progress Notes (Signed)
PATIENT: [name] [name] DOB: 08-22-1955   HISTORY OF PRESENT ILLNESS: [name] [name] is a 59 year old female with a history of migraine. She is referred by [name] [name],  Dr. Jannifer [name] for Botox injection as migraine prevention  She reported a history of migraine for many years,  Over the years, she has tried different preventive medications, Effexor, causing mental confusion, could not tolerate it, nortriptyline, no help  She is currently taking Topamax 100 mg twice a day, Inderal causing excessive fatigue, after stopping Enduron, her fatigue has much improved,  Her typical migraine are lateralized severe pounding headaches, with associated light noise sensitivity, nausea, vomiting, oftentimes,  Her headache is at her left side, with residual left the skull numbness, allodynia, sometimes she has oral with her migraine,  Flashing light in her visual field, blurry vision, before the onset of severe headaches,  Over the past 2 years, her headache almost becomes daily basis, mild to moderate pressure headache daily, 3-4 times each week,she would get more severe typical disability rating migraine, lasting half day to couple days,  Over the years, she has tried different triptan treatment, including Maxalt, Axert,currently taking Imitrex as needed, which does help her headaches  Unfortunately, she also carried a diagnosis CORONARY artery disease, reported history of stent placement in 2007,  She had her first Botox injection as migraine prevention in Nov 2015, related question was answered,  UPDATE Feb 17th 2016:  She responded very well to previous Botox injection in November 2015, reported 60% improvement, her headache is much less, less severe, she noticed wearing off over past 1 week, had 4 headaches in 1 week,  She had right lumpectomy for stage I right breast cancer February 14 2014, is going through radiation therapy soon  REVIEW OF SYSTEMS: Full 14 system review of systems performed and notable  only for:  As above    ALLERGIES: Allergies  Allergen Reactions  . Iohexol      Desc: throat selling resp distress hives.premedicate pt.entered 07/06/04, bsw.     HOME MEDICATIONS: Outpatient Prescriptions Prior to Visit  Medication Sig Dispense Refill  . aspirin 325 MG tablet Take 325 mg by mouth daily.     . Botulinum Toxin Type A 200 UNITS SOLR Inject 200 Units as directed.    . cephALEXin (KEFLEX) 250 MG capsule Take 250 mg by mouth daily.      Marland Kitchen CINNAMON PO Take 1,000 mg by mouth daily.     . Coenzyme Q10 (CO Q-10) 200 MG CAPS Take 1 capsule by mouth daily.      . fish oil-omega-3 fatty acids 1000 MG capsule Take 1.2 g by mouth 2 (two) times daily.    . furosemide (LASIX) 20 MG tablet Take 1 tablet (20 mg total) by mouth daily. 90 tablet 3  . Ginkgo Biloba 120 MG CAPS Take 1 capsule by mouth daily.      Marland Kitchen levothyroxine (SYNTHROID, LEVOTHROID) 112 MCG tablet Take 112 mcg by mouth daily.    . Multiple Vitamin (MULTIVITAMIN) capsule Take 1 capsule by mouth daily.    . nitroGLYCERIN (NITROSTAT) 0.4 MG SL tablet Place 0.4 mg under the tongue every 5 (five) minutes as needed. Pain    . pantoprazole (PROTONIX) 40 MG tablet Take 1 tablet (40 mg total) by mouth 2 (two) times daily. 60 tablet 11  . potassium chloride SA (KLOR-CON M20) 20 MEQ tablet TAKE 2 TABLETS BY MOUTH TWICE A DAY AND 1/2 TABLET AT BEDTIME 135 tablet 11  .  propranolol (INDERAL) 60 MG tablet Take 1 tablet (60 mg total) by mouth daily. 90 tablet 3  . ranitidine (ZANTAC) 300 MG tablet Take 1 tablet (300 mg total) by mouth 2 (two) times daily. 60 tablet 11  . simvastatin (ZOCOR) 40 MG tablet TAKE 1 TABLET BY MOUTH AT BEDTIME 30 tablet 9  . topiramate (TOPAMAX) 100 MG tablet Take 100 mg by mouth every evening.    . trimethoprim (TRIMPEX) 100 MG tablet Take 100 mg by mouth at bedtime.    Marland Kitchen oxyCODONE-acetaminophen (ROXICET) 5-325 MG per tablet Take 1-2 tablets by mouth every 4 (four) hours as needed. 30 tablet 0   No  facility-administered medications prior to visit.    PAST MEDICAL HISTORY: Past Medical History  Diagnosis Date  . Depression   . Anorexia   . Migraine   . GERD (gastroesophageal reflux disease)   . Seasonal allergies   . Arrhythmia   . CAD (coronary artery disease)   . Frequent UTI   . Edema   . Vitamin D deficiency   . Restless leg syndrome   . Microscopic hematuria   . Hypothyroid   . Breast cancer     ER+/PR+/Her2-  . Wears glasses   . Anxiety   . Stented coronary artery     PAST SURGICAL HISTORY: Past Surgical History  Procedure Laterality Date  . Cholecystectomy open    . Appendectomy    . Abdominal hysterectomy    . Back surgery  1997    herniated disc repair  . Cystoscopy      neg  . Cardiac catheterization  2007    stent  . Radioactive seed guided mastectomy with axillary sentinel lymph node biopsy Right 02/14/2014    Procedure: RADIOACTIVE SEED GUIDED PARTIAL MASTECTOMY WITH AXILLARY SENTINEL LYMPH NODE BIOPSY;  Surgeon: [name] Luna, MD;  Location: [facility];  Service: General;  Laterality: Right;    FAMILY HISTORY: Family History  Problem Relation Age of Onset  . Coronary artery disease Mother   . Thyroid disease Mother   . Alzheimer's disease Mother   . Diabetes Mother   . Heart disease Mother   . Cancer Brother     melnoma  . Ovarian cancer Paternal Aunt 68  . Bone cancer Paternal Uncle   . Alzheimer's disease Maternal Grandmother   . Heart disease Maternal Grandfather   . Pancreatic cancer Maternal Grandfather 82  . Ovarian cancer Maternal Aunt     dx in 45s  . Throat cancer Maternal Uncle     smoker  . Lung cancer Maternal Uncle     heavy smoker  . Ovarian cancer Paternal Aunt     dx in her 66s  . Cancer Cousin     "female cancer"    SOCIAL HISTORY: History   Social History  . Marital Status: Widowed    Spouse Name: N/A  . Number of Children: 1  . Years of Education: N/A   Occupational History  . not  working    Social History Main Topics  . Smoking status: Never Smoker   . Smokeless tobacco: Never Used  . Alcohol Use: 0.0 oz/week    0 Standard drinks or equivalent per week     Comment: socially  . Drug Use: No  . Sexual Activity: Not on file   Other Topics Concern  . Not on file   Social History Narrative   Does not get any regular exercise      Doctors   -  uro-[facility]ttlein   -cardio--hochrein   -Neurology--Dr. Jannifer [name]   -allergies--Dr. Carmelina Peal   Gyn past--Dr. Warnell Forester   Couselor--Dr. Mitchum   Endo-- Dr. Chalmers Cater      Lives with son who is 106 years old      PHYSICAL EXAM  Filed Vitals:   03/15/14 1315  BP: 100/58  Pulse: 56  Height: '5\' 8"'  (1.727 m)  Weight: 205 lb (92.987 kg)   PHYSICAL EXAMINATOINS:  Generalized: In no acute distress  Neck: Supple, no carotid bruits   Cardiac: Regular rate rhythm  Pulmonary: Clear to auscultation bilaterally  Musculoskeletal: No deformity  Neurological examination  Mentation: Alert oriented to time, place, history taking, and causual conversation  Cranial nerve II-XII: Pupils were equal round reactive to light extraocular movements were full, visual field were full on confrontational test.  Bilateral fundi were sharp  Facial sensation and strength were normal. hearing was intact to finger rubbing bilaterally. Uvula tongue midline.  head turning and shoulder shrug and were normal and symmetric.Tongue protrusion into cheek strength was normal.  Motor: normal tone, bulk and strength.  Sensory: Intact to fine touch, pinprick, preserved vibratory sensation, and proprioception at toes.  Coordination: Normal finger to nose, heel-to-shin bilaterally there was no truncal ataxia  Gait: Rising up from seated position without assistance, normal stance, without trunk ataxia, moderate stride, good arm swing, smooth turning, able to perform tiptoe, and heel walking without difficulty.   Romberg signs: Negative  Deep tendon reflexes:  Brachioradialis 2/2, biceps 2/2, triceps 2/2, patellar 2/2, Achilles 2/2, plantar responses were flexor bilaterally.     DIAGNOSTIC DATA (LABS, IMAGING, TESTING) - I reviewed patient records, labs, notes, testing and imaging myself where available.  Lab Results  Component Value Date   WBC 5.4 02/13/2014   HGB 13.5 02/13/2014   HCT 38.9 02/13/2014   MCV 89.0 02/13/2014   PLT 169 02/13/2014      Component Value Date/Time   NA 142 02/13/2014 1100   NA 144 01/04/2014 0823   K 4.5 02/13/2014 1100   K 4.2 01/04/2014 0823   CL 107 02/13/2014 1100   CO2 25 02/13/2014 1100   CO2 20* 01/04/2014 0823   GLUCOSE 120* 02/13/2014 1100   GLUCOSE 183* 01/04/2014 0823   BUN 14 02/13/2014 1100   BUN 21.3 01/04/2014 0823   CREATININE 1.24* 02/13/2014 1100   CREATININE 1.6* 01/04/2014 0823   CREATININE 1.35* 11/18/2013 1438   CALCIUM 9.9 02/13/2014 1100   CALCIUM 10.1 01/04/2014 0823   PROT 7.4 02/13/2014 1100   PROT 7.2 01/04/2014 0823   PROT 7.3 11/16/2013 1514   ALBUMIN 4.2 02/13/2014 1100   ALBUMIN 4.1 01/04/2014 0823   AST 26 02/13/2014 1100   AST 22 01/04/2014 0823   ALT 26 02/13/2014 1100   ALT 26 01/04/2014 0823   ALKPHOS 54 02/13/2014 1100   ALKPHOS 57 01/04/2014 0823   BILITOT 1.3* 02/13/2014 1100   BILITOT 1.33* 01/04/2014 0823   GFRNONAA 47* 02/13/2014 1100   GFRAA 54* 02/13/2014 1100   Lab Results  Component Value Date   CHOL 118 09/13/2013   HDL 37* 09/13/2013   LDLCALC 58 09/13/2013   TRIG 116 09/13/2013   CHOLHDL 3.2 09/13/2013   Lab Results  Component Value Date   HGBA1C 5.4 08/20/2012   No results found for: [id number] Lab Results  Component Value Date   TSH 0.54 12/10/2011      ASSESSMENT AND PLAN 59 y.o. year old female  With past medical history of chronic  migraine, tried and failed multiple preventative medications in the past, including Inderal, nortriptyline, currently taking Topamax, still has daily headaches,  More than 15 typical migraine  each months, each headaches last about 6 hours or longer,also reported a history of coronary artery disease,Maxalt  works for headache, but with her history of coronary artery disease, triptan is no longer good choice  BOTOX injection was performed according to protocol by [employer]. 100 units of BOTOX was dissolved into 2 cc NS.    Used 200 units  Corrugator 2 sites, 10 units Procerus 1 site, 5 unit Frontalis 4 sites,  20 units, Temporalis 8 sites,  40 units  Occipitalis 6 sites, 30 units Cervical Paraspinal, 4 sites, 20 units Trapezius, 6 sites, 30 units  Extra 45 units of Botox was injected into bilateral cervical paraspinals.  Patient tolerate the injection well. Will return for repeat injection in 3 months.   Marcial Pacas, M.D. Ph.D.  [facility] [place], [place] [address] Phone: [phone] Fax:      [phone]

## 2014-03-16 ENCOUNTER — Other Ambulatory Visit: Payer: Self-pay

## 2014-03-16 DIAGNOSIS — Z90710 Acquired absence of both cervix and uterus: Secondary | ICD-10-CM | POA: Diagnosis not present

## 2014-03-16 DIAGNOSIS — C50411 Malignant neoplasm of upper-outer quadrant of right female breast: Secondary | ICD-10-CM | POA: Diagnosis not present

## 2014-03-16 DIAGNOSIS — Z170 Estrogen receptor positive status [ER+]: Secondary | ICD-10-CM | POA: Diagnosis not present

## 2014-03-16 DIAGNOSIS — Z510 Encounter for antineoplastic radiation therapy: Secondary | ICD-10-CM | POA: Diagnosis not present

## 2014-03-16 DIAGNOSIS — Z9011 Acquired absence of right breast and nipple: Secondary | ICD-10-CM | POA: Diagnosis not present

## 2014-03-16 DIAGNOSIS — M79621 Pain in right upper arm: Secondary | ICD-10-CM | POA: Diagnosis not present

## 2014-03-16 MED ORDER — NITROGLYCERIN 0.4 MG SL SUBL: 0.4000 mg | SUBLINGUAL_TABLET | SUBLINGUAL | Status: DC | PRN

## 2014-03-17 ENCOUNTER — Other Ambulatory Visit: Payer: Self-pay | Admitting: *Deleted

## 2014-03-17 MED ORDER — NITROGLYCERIN 0.4 MG SL SUBL: 0.4000 mg | SUBLINGUAL_TABLET | SUBLINGUAL | Status: DC | PRN

## 2014-03-17 NOTE — Telephone Encounter (Signed)
I called the patient again.  Was not able to reach her.  I called the pharmacy, and they spoke with the patient on 02/10, stating she has discontinued this med.  They asked her to call us back if needed.

## 2014-03-20 DIAGNOSIS — M79621 Pain in right upper arm: Secondary | ICD-10-CM | POA: Diagnosis not present

## 2014-03-20 DIAGNOSIS — C50411 Malignant neoplasm of upper-outer quadrant of right female breast: Secondary | ICD-10-CM | POA: Diagnosis not present

## 2014-03-20 DIAGNOSIS — Z170 Estrogen receptor positive status [ER+]: Secondary | ICD-10-CM | POA: Diagnosis not present

## 2014-03-20 DIAGNOSIS — Z9011 Acquired absence of right breast and nipple: Secondary | ICD-10-CM | POA: Diagnosis not present

## 2014-03-20 DIAGNOSIS — Z510 Encounter for antineoplastic radiation therapy: Secondary | ICD-10-CM | POA: Diagnosis not present

## 2014-03-20 DIAGNOSIS — Z90710 Acquired absence of both cervix and uterus: Secondary | ICD-10-CM | POA: Diagnosis not present

## 2014-03-21 ENCOUNTER — Ambulatory Visit
Admission: RE | Admit: 2014-03-21 | Discharge: 2014-03-21 | Disposition: A | Discharge status: Home / Self Care | Payer: Medicare Other | Source: Ambulatory Visit | Attending: Radiation Oncology | Admitting: Radiation Oncology

## 2014-03-21 DIAGNOSIS — Z170 Estrogen receptor positive status [ER+]: Secondary | ICD-10-CM | POA: Diagnosis not present

## 2014-03-21 DIAGNOSIS — C50411 Malignant neoplasm of upper-outer quadrant of right female breast: Secondary | ICD-10-CM | POA: Insufficient documentation

## 2014-03-21 DIAGNOSIS — M79621 Pain in right upper arm: Secondary | ICD-10-CM | POA: Diagnosis not present

## 2014-03-21 DIAGNOSIS — Z510 Encounter for antineoplastic radiation therapy: Secondary | ICD-10-CM | POA: Diagnosis not present

## 2014-03-21 DIAGNOSIS — Z9011 Acquired absence of right breast and nipple: Secondary | ICD-10-CM | POA: Diagnosis not present

## 2014-03-21 DIAGNOSIS — Z90710 Acquired absence of both cervix and uterus: Secondary | ICD-10-CM | POA: Diagnosis not present

## 2014-03-21 MED ORDER — ALRA NON-METALLIC DEODORANT (RAD-ONC)
1.0000 "application " | Freq: Once | TOPICAL | Status: AC
  Administered 2014-03-21: 1 via TOPICAL

## 2014-03-21 MED ORDER — RADIAPLEXRX EX GEL
Freq: Once | CUTANEOUS | Status: AC
  Administered 2014-03-21: 11:00:00 via TOPICAL

## 2014-03-21 NOTE — Progress Notes (Signed)
Weekly Management Note Current Dose: 0  Gy  Projected Dose:52.72  Gy   Narrative:  The patient presents for routine under treatment assessment.  CBCT/MVCT images/Port film x-rays were reviewed.  The chart was checked. She has had more right upper extremity pain during her treatment with numbness lasting all night. She cannot sleep. She requested to be seen for evaluation of this. She also admits to being very anxious and her normal coping mechanisms not working very well to solve this.   Physical Findings: Weight:  . Unchanged  Impression:  The patient is tolerating radiation.  Plan:  Continue treatment as planned. Offered gabapentin, lyrica which she declined due to side effects.  I do not think a chronic narcotic or sleep aid is a good answer for her.  I offered and she agreed to a ABC class referral. I also will have SW follow up.

## 2014-03-21 NOTE — Progress Notes (Signed)
Routine of clinic reviewed.Given Radiation Therapy and You Booklet, alra deodorant,radiaplex and skin care sheet.Explained possible side effects to include skin discoloration, peeling, tenderness, swelling and fatigue.To apply gel twice daily but not within 3 to 4 hours of radiation treatment.See doctor weekly on Tuesday after treatment.Patient did voice concern about shooting pain down right arm to thumb especially after sim and port positioning.Question nerve impingement.She really doesn't want to take any other medications at this time.Informed of ABC classes.Seen by Dr.Wentworth.

## 2014-03-22 ENCOUNTER — Ambulatory Visit
Admission: RE | Admit: 2014-03-22 | Discharge: 2014-03-22 | Disposition: A | Discharge status: Home / Self Care | Payer: Medicare Other | Source: Ambulatory Visit | Attending: Radiation Oncology | Admitting: Radiation Oncology

## 2014-03-22 DIAGNOSIS — Z9011 Acquired absence of right breast and nipple: Secondary | ICD-10-CM | POA: Diagnosis not present

## 2014-03-22 DIAGNOSIS — Z170 Estrogen receptor positive status [ER+]: Secondary | ICD-10-CM | POA: Diagnosis not present

## 2014-03-22 DIAGNOSIS — C50411 Malignant neoplasm of upper-outer quadrant of right female breast: Secondary | ICD-10-CM | POA: Diagnosis not present

## 2014-03-22 DIAGNOSIS — Z510 Encounter for antineoplastic radiation therapy: Secondary | ICD-10-CM | POA: Diagnosis not present

## 2014-03-22 DIAGNOSIS — M79621 Pain in right upper arm: Secondary | ICD-10-CM | POA: Diagnosis not present

## 2014-03-22 DIAGNOSIS — Z90710 Acquired absence of both cervix and uterus: Secondary | ICD-10-CM | POA: Diagnosis not present

## 2014-03-23 ENCOUNTER — Ambulatory Visit: Payer: Medicare Other

## 2014-03-23 ENCOUNTER — Encounter: Payer: Self-pay | Admitting: *Deleted

## 2014-03-23 ENCOUNTER — Ambulatory Visit
Admission: RE | Admit: 2014-03-23 | Discharge: 2014-03-23 | Disposition: A | Discharge status: Home / Self Care | Payer: Medicare Other | Source: Ambulatory Visit | Attending: Radiation Oncology | Admitting: Radiation Oncology

## 2014-03-23 DIAGNOSIS — Z9011 Acquired absence of right breast and nipple: Secondary | ICD-10-CM | POA: Diagnosis not present

## 2014-03-23 DIAGNOSIS — Z510 Encounter for antineoplastic radiation therapy: Secondary | ICD-10-CM | POA: Diagnosis not present

## 2014-03-23 DIAGNOSIS — Z170 Estrogen receptor positive status [ER+]: Secondary | ICD-10-CM | POA: Diagnosis not present

## 2014-03-23 DIAGNOSIS — Z90710 Acquired absence of both cervix and uterus: Secondary | ICD-10-CM | POA: Diagnosis not present

## 2014-03-23 DIAGNOSIS — M79621 Pain in right upper arm: Secondary | ICD-10-CM | POA: Diagnosis not present

## 2014-03-23 DIAGNOSIS — C50411 Malignant neoplasm of upper-outer quadrant of right female breast: Secondary | ICD-10-CM | POA: Diagnosis not present

## 2014-03-23 NOTE — Progress Notes (Signed)
[facility] Work  Clinical Social Work was referred by P[employer] scheme manager for assessment of psychosocial needs due to concerns with possible anxiety and difficult emotions..  Clinical Social Worker phoned patient at home in attempt to offer support and assess for needs. CSW left message. CSW will attempt to check in on pt at one of her upcoming appointments for additional support.   Loren Racer, [place] Worker [place]  [place] Phone: [phone] Fax: [phone]

## 2014-03-24 ENCOUNTER — Ambulatory Visit: Payer: Medicare Other

## 2014-03-24 ENCOUNTER — Ambulatory Visit
Admission: RE | Admit: 2014-03-24 | Discharge: 2014-03-24 | Disposition: A | Discharge status: Home / Self Care | Payer: Medicare Other | Source: Ambulatory Visit | Attending: Radiation Oncology | Admitting: Radiation Oncology

## 2014-03-24 DIAGNOSIS — Z510 Encounter for antineoplastic radiation therapy: Secondary | ICD-10-CM | POA: Diagnosis not present

## 2014-03-24 DIAGNOSIS — C50411 Malignant neoplasm of upper-outer quadrant of right female breast: Secondary | ICD-10-CM | POA: Diagnosis not present

## 2014-03-24 DIAGNOSIS — Z9011 Acquired absence of right breast and nipple: Secondary | ICD-10-CM | POA: Diagnosis not present

## 2014-03-24 DIAGNOSIS — Z90710 Acquired absence of both cervix and uterus: Secondary | ICD-10-CM | POA: Diagnosis not present

## 2014-03-24 DIAGNOSIS — M79621 Pain in right upper arm: Secondary | ICD-10-CM | POA: Diagnosis not present

## 2014-03-24 DIAGNOSIS — Z170 Estrogen receptor positive status [ER+]: Secondary | ICD-10-CM | POA: Diagnosis not present

## 2014-03-27 ENCOUNTER — Ambulatory Visit
Admission: RE | Admit: 2014-03-27 | Discharge: 2014-03-27 | Disposition: A | Discharge status: Home / Self Care | Payer: Medicare Other | Source: Ambulatory Visit | Attending: Radiation Oncology | Admitting: Radiation Oncology

## 2014-03-27 ENCOUNTER — Encounter: Payer: Self-pay | Admitting: *Deleted

## 2014-03-27 ENCOUNTER — Ambulatory Visit: Payer: Medicare Other

## 2014-03-27 ENCOUNTER — Other Ambulatory Visit: Payer: Self-pay | Admitting: *Deleted

## 2014-03-27 ENCOUNTER — Telehealth: Payer: Self-pay | Admitting: Hematology

## 2014-03-27 DIAGNOSIS — Z510 Encounter for antineoplastic radiation therapy: Secondary | ICD-10-CM | POA: Diagnosis not present

## 2014-03-27 DIAGNOSIS — Z170 Estrogen receptor positive status [ER+]: Secondary | ICD-10-CM | POA: Diagnosis not present

## 2014-03-27 DIAGNOSIS — M79621 Pain in right upper arm: Secondary | ICD-10-CM | POA: Diagnosis not present

## 2014-03-27 DIAGNOSIS — Z90710 Acquired absence of both cervix and uterus: Secondary | ICD-10-CM | POA: Diagnosis not present

## 2014-03-27 DIAGNOSIS — Z9011 Acquired absence of right breast and nipple: Secondary | ICD-10-CM | POA: Diagnosis not present

## 2014-03-27 DIAGNOSIS — C50411 Malignant neoplasm of upper-outer quadrant of right female breast: Secondary | ICD-10-CM | POA: Diagnosis not present

## 2014-03-27 NOTE — Progress Notes (Signed)
[place] Work  Clinical Social Work was referred by Pension scheme manager for assessment of psychosocial needs.  Clinical Social Worker met with patient at [facility] to offer support and assess for needs.  Patient expressed feeling overwhelmed by the number of appointments and stress of maintaining her schedule.  CSw validated patients feelings and discussed resources at [facility] to assist patient with the treatment process.  CSW provided patient with and up to date schedule for her upcoming appointment in March and encouraged her to call the breast navigators with any questions regarding her treatment or medical needs.  CSW also informed breast navigators so they would be aware of patients concerns.  CSW and patient also discussed how she was managing emotionally, and the importance of emotional support during treatment.  Patient reported an increase in emotional stress and interventions that had helped in the past were no longer effective.  CSW and patient discussed other coping techniques and resources.  Patient has an established therapist in the community which she has not seen since January.  Patient stated her therapist was a positive support for her and her family and she planned to call and schedule an appointment.  CSW provided patient with a meditation CD to assist with anxiety and sleep.  CSW and patient also discussed support programs at [facility] and patient expressed interest in support group.  Patient did express some concern for how her son is adjusting to her diagnosis, but stated "he has handled it better than I thought he would".  CSW additional support and encouraged patient to call with any questions or concerns.  Patient stated she felt "much better" and was appreciative of CSW contact.                       [name], MSW, LCSW, OSW-C Clinical Social Worker [facility] [phone]

## 2014-03-27 NOTE — Telephone Encounter (Signed)
, °

## 2014-03-28 ENCOUNTER — Institutional Professional Consult (permissible substitution): Payer: Self-pay | Admitting: Radiation Oncology

## 2014-03-28 ENCOUNTER — Ambulatory Visit: Payer: Medicare Other

## 2014-03-28 ENCOUNTER — Ambulatory Visit
Admission: RE | Admit: 2014-03-28 | Discharge: 2014-03-28 | Disposition: A | Discharge status: Home / Self Care | Payer: Medicare Other | Source: Ambulatory Visit | Attending: Radiation Oncology | Admitting: Radiation Oncology

## 2014-03-28 ENCOUNTER — Telehealth: Payer: Self-pay | Admitting: *Deleted

## 2014-03-28 ENCOUNTER — Other Ambulatory Visit: Payer: Self-pay | Admitting: *Deleted

## 2014-03-28 DIAGNOSIS — C50411 Malignant neoplasm of upper-outer quadrant of right female breast: Secondary | ICD-10-CM | POA: Diagnosis not present

## 2014-03-28 DIAGNOSIS — Z170 Estrogen receptor positive status [ER+]: Secondary | ICD-10-CM | POA: Diagnosis not present

## 2014-03-28 DIAGNOSIS — Z90710 Acquired absence of both cervix and uterus: Secondary | ICD-10-CM | POA: Diagnosis not present

## 2014-03-28 DIAGNOSIS — M79621 Pain in right upper arm: Secondary | ICD-10-CM | POA: Diagnosis not present

## 2014-03-28 DIAGNOSIS — IMO0001 Reserved for inherently not codable concepts without codable children: Secondary | ICD-10-CM

## 2014-03-28 DIAGNOSIS — Z510 Encounter for antineoplastic radiation therapy: Secondary | ICD-10-CM | POA: Diagnosis not present

## 2014-03-28 DIAGNOSIS — Z9011 Acquired absence of right breast and nipple: Secondary | ICD-10-CM | POA: Diagnosis not present

## 2014-03-28 HISTORY — DX: Reserved for inherently not codable concepts without codable children: IMO0001

## 2014-03-28 MED ORDER — NITROGLYCERIN 0.4 MG SL SUBL: 0.4000 mg | SUBLINGUAL_TABLET | SUBLINGUAL | Status: DC | PRN

## 2014-03-28 NOTE — Progress Notes (Signed)
Weekly Management Note Current Dose: 13.35  Gy  Projected Dose: 42.72 Gy   Narrative:  The patient presents for routine under treatment assessment.  CBCT/MVCT images/Port film x-rays were reviewed.  The chart was checked. Doing well. Arm is slightly better after working with Butch Penny. No skin related complaints. Would like to see an MD on Monday as she has appt with surgeon on Tuesday.   Physical Findings: Weight: 202 lb 6.4 oz (91.808 kg). Unchanged. Skin slightly pink over right breast.   Impression:  The patient is tolerating radiation.  Plan:  Continue treatment as planned. Continue radiaplex. Put order in for PT . Patient can decide to go or decline. Will schedule her for PUT with Dr. Isidore Moos on Monday.

## 2014-03-28 NOTE — Telephone Encounter (Signed)
Received phone call from Ms. Venturini wanting to hold off on PT, cancelled appt. For 03-29-14 and told her that I would wait for word from her as to when to reschedule this

## 2014-03-28 NOTE — Telephone Encounter (Signed)
Called patient to inform of PT for 03-29-14 @ 9:30 am @ [facility], lvm for a return call

## 2014-03-29 ENCOUNTER — Ambulatory Visit: Payer: Medicare Other | Admitting: Physical Therapy

## 2014-03-29 ENCOUNTER — Ambulatory Visit
Admission: RE | Admit: 2014-03-29 | Discharge: 2014-03-29 | Disposition: A | Discharge status: Home / Self Care | Payer: Medicare Other | Source: Ambulatory Visit | Attending: Radiation Oncology | Admitting: Radiation Oncology

## 2014-03-29 ENCOUNTER — Ambulatory Visit: Payer: Medicare Other

## 2014-03-29 DIAGNOSIS — Z170 Estrogen receptor positive status [ER+]: Secondary | ICD-10-CM | POA: Diagnosis not present

## 2014-03-29 DIAGNOSIS — C50411 Malignant neoplasm of upper-outer quadrant of right female breast: Secondary | ICD-10-CM | POA: Diagnosis not present

## 2014-03-29 DIAGNOSIS — Z90710 Acquired absence of both cervix and uterus: Secondary | ICD-10-CM | POA: Diagnosis not present

## 2014-03-29 DIAGNOSIS — Z510 Encounter for antineoplastic radiation therapy: Secondary | ICD-10-CM | POA: Diagnosis not present

## 2014-03-29 DIAGNOSIS — Z9011 Acquired absence of right breast and nipple: Secondary | ICD-10-CM | POA: Diagnosis not present

## 2014-03-29 DIAGNOSIS — M79621 Pain in right upper arm: Secondary | ICD-10-CM | POA: Diagnosis not present

## 2014-03-30 ENCOUNTER — Ambulatory Visit: Payer: Medicare Other

## 2014-03-30 ENCOUNTER — Ambulatory Visit
Admission: RE | Admit: 2014-03-30 | Discharge: 2014-03-30 | Disposition: A | Discharge status: Home / Self Care | Payer: Medicare Other | Source: Ambulatory Visit | Attending: Radiation Oncology | Admitting: Radiation Oncology

## 2014-03-30 DIAGNOSIS — Z510 Encounter for antineoplastic radiation therapy: Secondary | ICD-10-CM | POA: Diagnosis not present

## 2014-03-30 DIAGNOSIS — Z170 Estrogen receptor positive status [ER+]: Secondary | ICD-10-CM | POA: Diagnosis not present

## 2014-03-30 DIAGNOSIS — M79621 Pain in right upper arm: Secondary | ICD-10-CM | POA: Diagnosis not present

## 2014-03-30 DIAGNOSIS — C50411 Malignant neoplasm of upper-outer quadrant of right female breast: Secondary | ICD-10-CM | POA: Diagnosis not present

## 2014-03-30 DIAGNOSIS — Z90710 Acquired absence of both cervix and uterus: Secondary | ICD-10-CM | POA: Diagnosis not present

## 2014-03-30 DIAGNOSIS — Z9011 Acquired absence of right breast and nipple: Secondary | ICD-10-CM | POA: Diagnosis not present

## 2014-03-31 ENCOUNTER — Ambulatory Visit
Admission: RE | Admit: 2014-03-31 | Discharge: 2014-03-31 | Disposition: A | Discharge status: Home / Self Care | Payer: Medicare Other | Source: Ambulatory Visit | Attending: Radiation Oncology | Admitting: Radiation Oncology

## 2014-03-31 ENCOUNTER — Ambulatory Visit: Payer: Medicare Other

## 2014-03-31 DIAGNOSIS — C50411 Malignant neoplasm of upper-outer quadrant of right female breast: Secondary | ICD-10-CM | POA: Diagnosis not present

## 2014-03-31 DIAGNOSIS — Z9011 Acquired absence of right breast and nipple: Secondary | ICD-10-CM | POA: Diagnosis not present

## 2014-03-31 DIAGNOSIS — Z90710 Acquired absence of both cervix and uterus: Secondary | ICD-10-CM | POA: Diagnosis not present

## 2014-03-31 DIAGNOSIS — M79621 Pain in right upper arm: Secondary | ICD-10-CM | POA: Diagnosis not present

## 2014-03-31 DIAGNOSIS — Z170 Estrogen receptor positive status [ER+]: Secondary | ICD-10-CM | POA: Diagnosis not present

## 2014-03-31 DIAGNOSIS — Z510 Encounter for antineoplastic radiation therapy: Secondary | ICD-10-CM | POA: Diagnosis not present

## 2014-04-03 ENCOUNTER — Ambulatory Visit
Admission: RE | Admit: 2014-04-03 | Discharge: 2014-04-03 | Disposition: A | Discharge status: Home / Self Care | Payer: Medicare Other | Source: Ambulatory Visit | Attending: Radiation Oncology | Admitting: Radiation Oncology

## 2014-04-03 ENCOUNTER — Other Ambulatory Visit (HOSPITAL_BASED_OUTPATIENT_CLINIC_OR_DEPARTMENT_OTHER): Payer: Medicare Other

## 2014-04-03 ENCOUNTER — Encounter: Payer: Self-pay | Admitting: Radiation Oncology

## 2014-04-03 ENCOUNTER — Ambulatory Visit: Payer: Medicare Other

## 2014-04-03 VITALS — BP 109/66 | HR 56 | Temp 97.7°F | Wt 200.6 lb

## 2014-04-03 DIAGNOSIS — Z170 Estrogen receptor positive status [ER+]: Secondary | ICD-10-CM | POA: Diagnosis not present

## 2014-04-03 DIAGNOSIS — C50411 Malignant neoplasm of upper-outer quadrant of right female breast: Secondary | ICD-10-CM | POA: Diagnosis not present

## 2014-04-03 DIAGNOSIS — C50811 Malignant neoplasm of overlapping sites of right female breast: Secondary | ICD-10-CM

## 2014-04-03 DIAGNOSIS — M79621 Pain in right upper arm: Secondary | ICD-10-CM | POA: Diagnosis not present

## 2014-04-03 DIAGNOSIS — Z510 Encounter for antineoplastic radiation therapy: Secondary | ICD-10-CM | POA: Diagnosis not present

## 2014-04-03 DIAGNOSIS — Z90710 Acquired absence of both cervix and uterus: Secondary | ICD-10-CM | POA: Diagnosis not present

## 2014-04-03 DIAGNOSIS — Z9011 Acquired absence of right breast and nipple: Secondary | ICD-10-CM | POA: Diagnosis not present

## 2014-04-03 LAB — CBC WITH DIFFERENTIAL/PLATELET
BASO%: 0.5 % (ref 0.0–2.0)
Basophils Absolute: 0 10*3/uL (ref 0.0–0.1)
EOS%: 3.2 % (ref 0.0–7.0)
Eosinophils Absolute: 0.2 10*3/uL (ref 0.0–0.5)
HCT: 40 % (ref 34.8–46.6)
HGB: 13.2 g/dL (ref 11.6–15.9)
LYMPH%: 23.4 % (ref 14.0–49.7)
MCH: 30.2 pg (ref 25.1–34.0)
MCHC: 33 g/dL (ref 31.5–36.0)
MCV: 91.4 fL (ref 79.5–101.0)
MONO#: 0.5 10*3/uL (ref 0.1–0.9)
MONO%: 9.4 % (ref 0.0–14.0)
NEUT#: 3.1 10*3/uL (ref 1.5–6.5)
NEUT%: 63.5 % (ref 38.4–76.8)
PLATELETS: 175 10*3/uL (ref 145–400)
RBC: 4.37 10*6/uL (ref 3.70–5.45)
RDW: 13.3 % (ref 11.2–14.5)
WBC: 4.9 10*3/uL (ref 3.9–10.3)
lymph#: 1.1 10*3/uL (ref 0.9–3.3)

## 2014-04-03 LAB — COMPREHENSIVE METABOLIC PANEL (CC13)
ALK PHOS: 59 U/L (ref 40–150)
ALT: 20 U/L (ref 0–55)
AST: 21 U/L (ref 5–34)
Albumin: 4 g/dL (ref 3.5–5.0)
Anion Gap: 9 mEq/L (ref 3–11)
BUN: 25.2 mg/dL (ref 7.0–26.0)
CO2: 23 mEq/L (ref 22–29)
Calcium: 9.8 mg/dL (ref 8.4–10.4)
Chloride: 111 mEq/L — ABNORMAL HIGH (ref 98–109)
Creatinine: 1.3 mg/dL — ABNORMAL HIGH (ref 0.6–1.1)
EGFR: 46 mL/min/{1.73_m2} — ABNORMAL LOW (ref >90)
GLUCOSE: 114 mg/dL (ref 70–140)
POTASSIUM: 4.5 meq/L (ref 3.5–5.1)
SODIUM: 144 meq/L (ref 136–145)
Total Bilirubin: 1.21 mg/dL — ABNORMAL HIGH (ref 0.20–1.20)
Total Protein: 7.1 g/dL (ref 6.4–8.3)

## 2014-04-03 LAB — FOLLICLE STIMULATING HORMONE: FSH: 44.9 m[IU]/mL

## 2014-04-03 LAB — LUTEINIZING HORMONE: LH: 7.2 m[IU]/mL

## 2014-04-03 NOTE — Progress Notes (Signed)
Weekly Management Note   ICD-9-CM ICD-10-CM   1. Breast cancer of upper-outer quadrant of right female breast 174.4 C50.411     Current Dose: 24.03  Gy  Projected Dose: 42.72 Gy   Narrative:  The patient presents for routine under treatment assessment.  CBCT/MVCT images/Port film x-rays were reviewed.  The chart was checked. Weekly assessment of radiation to right breast. Completed 9 of 21 treatments.Skin is pink.Has sharp shooting pain through treatment breast.Informed this is normal, just nerve rejeneration. States she has bilateral shoulder pain and left hip pain as well as diffuse body cramps. She was told she may have a pinched nerve per her neurologist.  Walks 30 minutes a day on treadmill. On Lasix and K+ supplement. Fatigue unchanged."I am always tired".Continue application of radiaplex twice daily.   Physical Findings: Weight: 200 lb 9.6 oz (90.992 kg).   Skin intact and pink over right breast.   CMP     Component Value Date/Time   NA 144 04/03/2014 1317   NA 142 02/13/2014 1100   K 4.5 04/03/2014 1317   K 4.5 02/13/2014 1100   CL 107 02/13/2014 1100   CO2 23 04/03/2014 1317   CO2 25 02/13/2014 1100   GLUCOSE 114 04/03/2014 1317   GLUCOSE 120* 02/13/2014 1100   BUN 25.2 04/03/2014 1317   BUN 14 02/13/2014 1100   CREATININE 1.3* 04/03/2014 1317   CREATININE 1.24* 02/13/2014 1100   CREATININE 1.35* 11/18/2013 1438   CALCIUM 9.8 04/03/2014 1317   CALCIUM 9.9 02/13/2014 1100   PROT 7.1 04/03/2014 1317   PROT 7.4 02/13/2014 1100   PROT 7.3 11/16/2013 1514   ALBUMIN 4.0 04/03/2014 1317   ALBUMIN 4.2 02/13/2014 1100   AST 21 04/03/2014 1317   AST 26 02/13/2014 1100   ALT 20 04/03/2014 1317   ALT 26 02/13/2014 1100   ALKPHOS 59 04/03/2014 1317   ALKPHOS 54 02/13/2014 1100   BILITOT 1.21* 04/03/2014 1317   BILITOT 1.3* 02/13/2014 1100   GFRNONAA 47* 02/13/2014 1100   GFRAA 54* 02/13/2014 1100     Impression:  The patient is tolerating radiation.  Plan:  Continue  treatment as planned. Continue radiaplex. Electrolytes are normal.  Advised her to discuss cramps and joint pain with her PCP and/or neurologist. I will alert Dr Pablo Ledger to her issues which are likely unrelated to her breast cancer.  (except right shoulder pain may be r/t surgery and positioning of arm at RT). PT currently deferred. Massage therapy offered and deferred. -----------------------------------  Eppie Gibson, MD

## 2014-04-03 NOTE — Progress Notes (Signed)
Weekly assessment of radiation to right breast.Completed 9 of 21 treatments.Skin is pink.Has sharp shooting pain through treatment breast.Informed this is normal, just nerve rejeneration.States she has shoulder pain and left hip pain.Walks 30 minutes a day on treadmill.Fatigue unchanged."I am always tired".Continue application of radiaplex twice daily.

## 2014-04-04 ENCOUNTER — Ambulatory Visit: Payer: Medicare Other

## 2014-04-04 ENCOUNTER — Encounter: Payer: Self-pay | Admitting: Radiation Oncology

## 2014-04-04 ENCOUNTER — Ambulatory Visit
Admission: RE | Admit: 2014-04-04 | Discharge: 2014-04-04 | Disposition: A | Discharge status: Home / Self Care | Payer: Medicare Other | Source: Ambulatory Visit | Attending: Radiation Oncology | Admitting: Radiation Oncology

## 2014-04-04 DIAGNOSIS — Z510 Encounter for antineoplastic radiation therapy: Secondary | ICD-10-CM | POA: Diagnosis not present

## 2014-04-04 DIAGNOSIS — C50411 Malignant neoplasm of upper-outer quadrant of right female breast: Secondary | ICD-10-CM | POA: Diagnosis not present

## 2014-04-04 DIAGNOSIS — M79621 Pain in right upper arm: Secondary | ICD-10-CM | POA: Diagnosis not present

## 2014-04-04 DIAGNOSIS — Z90710 Acquired absence of both cervix and uterus: Secondary | ICD-10-CM | POA: Diagnosis not present

## 2014-04-04 DIAGNOSIS — Z170 Estrogen receptor positive status [ER+]: Secondary | ICD-10-CM | POA: Diagnosis not present

## 2014-04-04 DIAGNOSIS — Z9011 Acquired absence of right breast and nipple: Secondary | ICD-10-CM | POA: Diagnosis not present

## 2014-04-05 ENCOUNTER — Ambulatory Visit: Payer: Medicare Other

## 2014-04-05 ENCOUNTER — Ambulatory Visit
Admission: RE | Admit: 2014-04-05 | Discharge: 2014-04-05 | Disposition: A | Discharge status: Home / Self Care | Payer: Medicare Other | Source: Ambulatory Visit | Attending: Radiation Oncology | Admitting: Radiation Oncology

## 2014-04-05 DIAGNOSIS — Z90710 Acquired absence of both cervix and uterus: Secondary | ICD-10-CM | POA: Diagnosis not present

## 2014-04-05 DIAGNOSIS — Z9011 Acquired absence of right breast and nipple: Secondary | ICD-10-CM | POA: Diagnosis not present

## 2014-04-05 DIAGNOSIS — Z510 Encounter for antineoplastic radiation therapy: Secondary | ICD-10-CM | POA: Diagnosis not present

## 2014-04-05 DIAGNOSIS — M79621 Pain in right upper arm: Secondary | ICD-10-CM | POA: Diagnosis not present

## 2014-04-05 DIAGNOSIS — C50411 Malignant neoplasm of upper-outer quadrant of right female breast: Secondary | ICD-10-CM | POA: Diagnosis not present

## 2014-04-05 DIAGNOSIS — Z170 Estrogen receptor positive status [ER+]: Secondary | ICD-10-CM | POA: Diagnosis not present

## 2014-04-06 ENCOUNTER — Ambulatory Visit
Admission: RE | Admit: 2014-04-06 | Discharge: 2014-04-06 | Disposition: A | Discharge status: Home / Self Care | Payer: Medicare Other | Source: Ambulatory Visit | Attending: Radiation Oncology | Admitting: Radiation Oncology

## 2014-04-06 ENCOUNTER — Ambulatory Visit: Payer: Medicare Other

## 2014-04-06 DIAGNOSIS — M79621 Pain in right upper arm: Secondary | ICD-10-CM | POA: Diagnosis not present

## 2014-04-06 DIAGNOSIS — C50411 Malignant neoplasm of upper-outer quadrant of right female breast: Secondary | ICD-10-CM | POA: Diagnosis not present

## 2014-04-06 DIAGNOSIS — Z90710 Acquired absence of both cervix and uterus: Secondary | ICD-10-CM | POA: Diagnosis not present

## 2014-04-06 DIAGNOSIS — Z170 Estrogen receptor positive status [ER+]: Secondary | ICD-10-CM | POA: Diagnosis not present

## 2014-04-06 DIAGNOSIS — Z510 Encounter for antineoplastic radiation therapy: Secondary | ICD-10-CM | POA: Diagnosis not present

## 2014-04-06 DIAGNOSIS — Z9011 Acquired absence of right breast and nipple: Secondary | ICD-10-CM | POA: Diagnosis not present

## 2014-04-07 ENCOUNTER — Ambulatory Visit
Admission: RE | Admit: 2014-04-07 | Discharge: 2014-04-07 | Disposition: A | Discharge status: Home / Self Care | Payer: Medicare Other | Source: Ambulatory Visit | Attending: Radiation Oncology | Admitting: Radiation Oncology

## 2014-04-07 ENCOUNTER — Ambulatory Visit: Payer: Medicare Other

## 2014-04-07 DIAGNOSIS — C50411 Malignant neoplasm of upper-outer quadrant of right female breast: Secondary | ICD-10-CM | POA: Diagnosis not present

## 2014-04-07 DIAGNOSIS — Z170 Estrogen receptor positive status [ER+]: Secondary | ICD-10-CM | POA: Diagnosis not present

## 2014-04-07 DIAGNOSIS — Z9011 Acquired absence of right breast and nipple: Secondary | ICD-10-CM | POA: Diagnosis not present

## 2014-04-07 DIAGNOSIS — Z510 Encounter for antineoplastic radiation therapy: Secondary | ICD-10-CM | POA: Diagnosis not present

## 2014-04-07 DIAGNOSIS — M79621 Pain in right upper arm: Secondary | ICD-10-CM | POA: Diagnosis not present

## 2014-04-07 DIAGNOSIS — Z90710 Acquired absence of both cervix and uterus: Secondary | ICD-10-CM | POA: Diagnosis not present

## 2014-04-07 LAB — ESTRADIOL, ULTRA SENS: ESTRADIOL, ULTRA SENSITIVE: 3 pg/mL

## 2014-04-10 ENCOUNTER — Encounter: Payer: Self-pay | Admitting: Hematology

## 2014-04-10 ENCOUNTER — Ambulatory Visit: Payer: Medicare Other

## 2014-04-10 ENCOUNTER — Ambulatory Visit
Admission: RE | Admit: 2014-04-10 | Discharge: 2014-04-10 | Disposition: A | Discharge status: Home / Self Care | Payer: Medicare Other | Source: Ambulatory Visit | Attending: Radiation Oncology | Admitting: Radiation Oncology

## 2014-04-10 ENCOUNTER — Ambulatory Visit (HOSPITAL_BASED_OUTPATIENT_CLINIC_OR_DEPARTMENT_OTHER): Payer: Medicare Other | Admitting: Hematology

## 2014-04-10 VITALS — BP 112/69 | HR 64 | Temp 97.5°F | Resp 18 | Ht 68.0 in | Wt 200.3 lb

## 2014-04-10 DIAGNOSIS — C50411 Malignant neoplasm of upper-outer quadrant of right female breast: Secondary | ICD-10-CM

## 2014-04-10 DIAGNOSIS — Z90710 Acquired absence of both cervix and uterus: Secondary | ICD-10-CM | POA: Diagnosis not present

## 2014-04-10 DIAGNOSIS — Z510 Encounter for antineoplastic radiation therapy: Secondary | ICD-10-CM | POA: Diagnosis not present

## 2014-04-10 DIAGNOSIS — C50911 Malignant neoplasm of unspecified site of right female breast: Secondary | ICD-10-CM

## 2014-04-10 DIAGNOSIS — Z9011 Acquired absence of right breast and nipple: Secondary | ICD-10-CM | POA: Diagnosis not present

## 2014-04-10 DIAGNOSIS — M79621 Pain in right upper arm: Secondary | ICD-10-CM | POA: Diagnosis not present

## 2014-04-10 DIAGNOSIS — Z170 Estrogen receptor positive status [ER+]: Secondary | ICD-10-CM | POA: Diagnosis not present

## 2014-04-10 MED ORDER — RADIAPLEXRX EX GEL
Freq: Once | CUTANEOUS | Status: AC
  Administered 2014-04-10: 16:00:00 via TOPICAL

## 2014-04-10 NOTE — Progress Notes (Signed)
[facility]  [phone] Fax:([phone]) [place] Note   Patient Care Team: Abner Greenspan, MD as PCP - General Minus Breeding, MD as Consulting Physician (Cardiology) Holley Bouche, NP as Nurse Practitioner (Nurse Practitioner) Erroll Luna, MD as Consulting Physician (General Surgery) Thea Silversmith, MD as Consulting Physician (Radiation Oncology) Truitt Merle, MD as Consulting Physician (Hematology) 04/10/2014  CHIEF COMPLAINTS Follow up stage IA right breast cancer, pT1bN0    Breast cancer of upper-outer quadrant of right female breast   12/29/2013 Imaging Screening mammogram and [place]: an irregular shadowing hypoechoic mass right breast 9 o'clock location 4 cm from the nipple measuring 7 x 7 x 7 mm. No adenopathy    12/29/2013 Initial Diagnosis Breast cancer of upper-outer quadrant of right female breast   12/29/2013 Initial Biopsy Grade I-II IDA, ER+/PR+/HER2-, Ki67 10%    02/14/2014 Surgery Right lumpectomy; grade 2 IDC spanning 0.9 cm with associated grade 2 DCIS.  5 sentinel lymph nodes negative.  HER2 repeated; results pending.    02/14/2014 Pathologic Stage pT1cpN0; Stage IA   02/14/2014 Oncotype testing RS 21, no adjuvant chemo offered.      HISTORY OF PRESENTING ILLNESS:  [name] [name] 59 y.o. female presents to our breast multidisciplinary clinic today because of newly diagnosed breast cancer.   This was discovered by routine screening mammogram a few weeks ago. She underwent diagnostic mammogram and ultrasound on 12/29/2013 which showed an irregular shadowing hypoechoic mass in right breast likely position measuring 7 mm. No adenopathy. She underwent core needle biopsy on the same day, which showed grade 1-2 invasive ductal adenocarcinoma, ER positive PR positive and HER-2 negative, Ki-67 10%.  She has been feeling very anxious since the biopsy. She does have history of coronary artery disease, status post stent placement in [facility]  7. Since the breast cancer diagnosis, she has had several episodes of anxiety attack, hypoventilation, with chest tightness. She appeared quite nervous when I met her first in the clinic. Before the biopsy, she felt well in general, she does have intermittent migraine headaches, chronic leg swollen, intermittent chest pain, denies any other new symptoms, no weight loss or other changes.  She lives with her only son, who is 51 years old, on disability due to depression and suicidal ideas. She is his caregiver.  INTERIM HISTORY: She returns for follow up. She is still on radiation now, and will finish on 3/23. She is doing well overall with radiation, started having some skin burning lately, but no other side effects. She still has some right arm discomfort and some limitation in the rage of right shoulder movement. No other new complains. She has some joint discomfort in left shoulder and hip also.    MEDICAL HISTORY:  Past Medical History  Diagnosis Date  . Depression   . Anorexia   . Migraine   . GERD (gastroesophageal reflux disease)   . Seasonal allergies   . Arrhythmia   . CAD (coronary artery disease), s/p stent placement  2007   . Frequent UTI   . Edema   . Vitamin D deficiency   . Restless leg syndrome   . Microscopic hematuria   . Hypothyroid   She has psychologist Dr. Rica Mote   SURGICAL HISTORY: Past Surgical History  Procedure Laterality Date  . Cholecystectomy open    . Appendectomy    . Abdominal hysterectomy  1983  . Back surgery  1997    herniated disc repair  . Cystoscopy  neg  Back surgery in 1997   SOCIAL HISTORY: History   Social History  . Marital Status: Widowed, husband died of colon cancer in 63     Spouse Name: N/A    Number of Children: One son  37 yo, on disability for depression, who lives with her   . Years of Education: N/A   Occupational History  . Secretary, Clinical cytogeneticist, retired     Social History Main Topics  . Smoking status:  Former Smoker    Start date: 10/14/1971  . Smokeless tobacco: Never Used  . Alcohol Use: Yes     Comment: socially  . Drug Use: No  . Sexual Activity: Not on file   Other Topics Concern  . Not on file   Social History Narrative   Does not get any regular exercise      Doctors   -uro-Ottlein   -cardio--hochrein   -Neurology--Dr. Jannifer Franklin   -allergies--Dr. Carmelina Peal   Gyn past--Dr. Warnell Forester   Couselor--Dr. Mitchum   Endo-- Dr. Chalmers Cater      Lives with son who is 2 years old    FAMILY HISTORY: Family History  Problem Relation Age of Onset  . Coronary artery disease Mother   . Thyroid disease Mother   . Alzheimer's disease Mother   . Diabetes Mother   . Heart disease Mother   . Cancer Brother     melnoma  . Ovarian cancer Paternal Aunt 69  . Bone cancer Paternal Uncle   . Alzheimer's disease Maternal Grandmother   . Heart disease Maternal Grandfather   . Pancreatic cancer Maternal Grandfather 87  . Ovarian cancer Maternal Aunt     dx in 14s  . Throat cancer Maternal Uncle     smoker  . Lung cancer Maternal Uncle     heavy smoker  . Ovarian cancer Paternal Aunt     dx in her 9s  . Cancer Cousin     "female cancer"   GYN HISTORY  Menarchal: 10 LMP: 1983 hysrectomy  Contraceptive: 2 years  HRT: was on for 10-20 years, off several years  GxP1: several miscarriages     ALLERGIES:  is allergic to iohexol.  MEDICATIONS:  Current Outpatient Prescriptions  Medication Sig Dispense Refill  . aspirin 325 MG tablet Take 325 mg by mouth daily.     . cephALEXin (KEFLEX) 250 MG capsule Take 250 mg by mouth daily.      Marland Kitchen CINNAMON PO Take 1,000 mg by mouth daily.     . Coenzyme Q10 (CO Q-10) 200 MG CAPS Take 1 capsule by mouth daily.      . divalproex (DEPAKOTE) 250 MG DR tablet Take 1 tablet by mouth daily for 1 week then increase to 1 tablet by mouth twice a day. 60 tablet 3  . fish oil-omega-3 fatty acids 1000 MG capsule Take 1.2 g by mouth 2 (two) times daily.    .  furosemide (LASIX) 20 MG tablet Take 1 tablet (20 mg total) by mouth daily. 90 tablet 3  . Ginkgo Biloba 120 MG CAPS Take 1 capsule by mouth daily.      Marland Kitchen levothyroxine (SYNTHROID, LEVOTHROID) 112 MCG tablet Take 112 mcg by mouth daily.    . Multiple Vitamin (MULTIVITAMIN) capsule Take 1 capsule by mouth daily.    . nitroGLYCERIN (NITROSTAT) 0.4 MG SL tablet Place 0.4 mg under the tongue every 5 (five) minutes as needed. Pain    . pantoprazole (PROTONIX) 40 MG tablet Take 1 tablet (  40 mg total) by mouth 2 (two) times daily. 60 tablet 11  . potassium chloride SA (KLOR-CON M20) 20 MEQ tablet TAKE 2 TABLETS BY MOUTH TWICE A DAY AND 1/2 TABLET AT BEDTIME 135 tablet 11  . propranolol ER (INDERAL LA) 120 MG 24 hr capsule TAKE ONE CAPSULE BY MOUTH EVERY DAY AT BEDTIME 30 capsule 5  . ranitidine (ZANTAC) 300 MG tablet Take 1 tablet (300 mg total) by mouth 2 (two) times daily. 60 tablet 11  . simvastatin (ZOCOR) 40 MG tablet TAKE 1 TABLET BY MOUTH AT BEDTIME 30 tablet 9  .      . topiramate (TOPAMAX) 50 MG tablet Take 1 tablet  in the AM & 2 tablets in the PM for 1 week. Then to 1 tablet twice a day for 1 week, then 1 tablet daily for 1 week then stop. 50 tablet 0  . trimethoprim (TRIMPEX) 100 MG tablet Take 100 mg by mouth at bedtime.     No current facility-administered medications for this visit.    REVIEW OF SYSTEMS:   Constitutional: Denies fevers, chills or abnormal night sweats Eyes: Denies blurriness of vision, double vision or watery eyes Ears, nose, mouth, throat, and face: Denies mucositis or sore throat Respiratory: Denies cough, dyspnea or wheezes Cardiovascular: (+) palpitation and intermittent chest pain, (+) lower extremity swelling Gastrointestinal:  Denies nausea, heartburn or change in bowel habits Skin: Denies abnormal skin rashes Lymphatics: Denies new lymphadenopathy or easy bruising Neurological:Denies numbness, tingling or new weaknesses Behavioral/Psych: (+) very  anxious All other systems were reviewed with the patient and are negative.  PHYSICAL EXAMINATION: ECOG PERFORMANCE STATUS: 0 - Asymptomatic  Filed Vitals:   04/10/14 1317  BP: 112/69  Pulse: 64  Temp: 97.5 F (36.4 C)  Resp: 18   Filed Weights   04/10/14 1317  Weight: 200 lb 4.8 oz (90.855 kg)    GENERAL:alert, no distress and comfortable SKIN: skin color, texture, turgor are normal, no rashes or significant lesions EYES: normal, conjunctiva are pink and non-injected, sclera clear OROPHARYNX:no exudate, no erythema and lips, buccal mucosa, and tongue normal  NECK: supple, thyroid normal size, non-tender, without nodularity LYMPH:  no palpable lymphadenopathy in the cervical, axillary or inguinal LUNGS: clear to auscultation and percussion with normal breathing effort HEART: regular rate & rhythm and no murmurs and no lower extremity edema ABDOMEN:abdomen soft, non-tender and normal bowel sounds Musculoskeletal:no cyanosis of digits and no clubbing  PSYCH: alert & oriented x 3 with fluent speech NEURO: no focal motor/sensory deficits Breasts: Breast inspection showed them to be symmetrical with no nipple discharge. Palpation of the breasts and axilla revealed no obvious mass that I could appreciate. Surgical scar in the right breast is well-healed.   LABORATORY DATA:  I have reviewed the data as listed Lab Results  Component Value Date   WBC 4.9 04/03/2014   HGB 13.2 04/03/2014   HCT 40.0 04/03/2014   MCV 91.4 04/03/2014   PLT 175 04/03/2014    Recent Labs  11/16/13 1514 11/18/13 1438 01/04/14 0823 02/13/14 1100 04/03/14 1317  NA  --  142 144 142 144  K  --  4.2 4.2 4.5 4.5  CL  --  110  --  107  --   CO2  --  22 20* 25 23  GLUCOSE  --  116* 183* 120* 114  BUN  --  23 21.3 14 25.2  CREATININE  --  1.35* 1.6* 1.24* 1.3*  CALCIUM  --  9.4 10.1  9.9 9.8  GFRNONAA  --   --   --  47*  --   GFRAA  --   --   --  54*  --   PROT 7.3 6.6 7.2 7.4 7.1  ALBUMIN  --   4.1 4.1 4.2 4.0  AST _0 ALT _1 ALKPHOS 54 47 57 54 59  BILITOT 1.3* 1.4* 1.33* 1.3* 1.21*  BILIDIR 0.29  --   --   --   --    PATHOLOGY REPORT 12/30/2013 Diagnosis 1. Breast, lumpectomy, Right 02/14/2014 - INVASIVE GRADE II DUCTAL CARCINOMA SPANNING 0.9 CM IN GREATEST DIMENSION. - ASSOCIATED INTERMEDIATE GRADE DUCTAL CARCINOMA IN SITU. - MARGINS ARE NEGATIVE. - SEE ONCOLOGY TEMPLATE. 2. Breast, excision, Right - BENIGN BREAST PARENCHYMA WITH SCATTERED ACUTE AND CHRONIC INFLAMMATION. - NO ATYPIA, HYPERPLASIA, OR MALIGNANCY IDENTIFIED. 3. Lymph node, sentinel, biopsy, Right axillary - ONE BENIGN LYMPH NODE WITH NO TUMOR SEEN (0/1). 4. Lymph node, sentinel, biopsy, Right axillary - ONE BENIGN LYMPH NODE WITH NO TUMOR SEEN (0/1). 5. Lymph node, sentinel, biopsy, Right axillary - ONE BENIGN LYMPH NODE WITH NO TUMOR SEEN (0/1). 6. Lymph node, sentinel, biopsy, Right axillary - ONE BENIGN LYMPH NODE WITH NO TUMOR SEEN (0/1). 7. Lymph node, sentinel, biopsy, Right axillary - ONE BENIGN LYMPH NODE WITH NO TUMOR SEEN (0/1). 1. BREAST, INVASIVE TUMOR, WITH LYMPH NODES PRESENT Specimen, including laterality and lymph node sampling (sentinel, non-sentinel): Right partial breast with right axillary sentinel lymph node sampling. Procedure: Right breast lumpectomy with right axillary sentinel lymph node mapping and biopsy. Histologic type: Invasive ductal carcinoma. Grade: II. Tubule formation: 3. Nuclear pleomorphism: 2. Mitotic:1. Tumor size (gross measurement and microscopic measurement): 0.9 cm. 1 of 4 FINAL for MAKINSEY, PEPITONE ([id number]) Microscopic Comment(continued) Margins: Invasive, distance to closest margin: 0.4 cm (medial margin). In-situ, distance to closest margin: At least 0.4 cm (medial margin). Lymphovascular invasion: Definitive lymph/vascular invasion is not identified. Ductal carcinoma in situ: Yes, present. Grade: Intermediate  grade. Extensive intraductal component: No. Lobular neoplasia: Not identified. Tumor focality: Unifocal. Treatment effect: Not applicable. Extent of tumor: Tumor confined to breast parenchyma. Skin: Not received. Nipple: Not received. Skeletal muscle: Not received. Lymph nodes: Examined: 5 Sentinel. 5 Non-sentinel. 5 Total. Lymph nodes with metastasis: 0. Isolated tumor cells (< 0.2 mm): 0. Micrometastasis: (> 0.2 mm and < 2.0 mm): 0. Macrometastasis: (> 2.0 mm): 0. Extracapsular extension: Not applicable. Breast prognostic profile: Performed on previous biopsy ([id number]) Estrogen receptor: 98%, positive. Progesterone receptor: 81%, positive. Her 2 neu by C[facility]: 1.24 ratio, not amplified. Ki-67: 15% Non-neoplastic breast: Fat necrosis. TNM: pT1b, pN0.  ONCOTYPE DX RS score: 21    RADIOGRAPHIC STUDIES: I have personally reviewed the radiological images as listed and agreed with the findings in the report.  US Breast Ltd Uni Right Inc Axilla 12/29/2013      FINDINGS: On physical exam, I palpate no abnormality in the right breast 9 o'clock location.  Ultrasound is performed, showing an irregular shadowing hypoechoic mass right breast 9 o'clock location 4 cm from the nipple measuring 7 x 7 x 7 mm. This corresponds to the mammographic abnormality.  There is no right axillary lymphadenopathy.  IMPRESSION: Suspicious right breast mass 9 o'clock location. Ultrasound-guided core biopsy will be performed today and dictated separately.  RECOMMENDATION: Ultrasound-guided right breast biopsy  I have discussed the findings and recommendations with the patient. Results were also provided in writing at the conclusion of the  visit. If applicable, a reminder letter will be sent to the patient regarding the next appointment.  BI-RADS CATEGORY  4: Suspicious.   Electronically Signed   By: Conchita Paris M.D.   On: 12/29/2013 15:59   Mm Screening Breast Tomo Bilateral 12/08/2013      FINDINGS:  In the right breast, a possible mass warrants further evaluation with ultrasound. In the left breast, no findings suspicious for malignancy. Images were processed with CAD.   IMPRESSION: Further evaluation is suggested for possible mass in the right breast.  RECOMMENDATION: Ultrasound of the right breast. (Code:[id number]-R-[id number])  The patient will be contacted regarding the findings, and additional imaging will be scheduled.  BI-RADS CATEGORY  0: Incomplete. Need additional imaging evaluation and/or prior mammograms for comparison.   Electronically Signed   By: Curlene Dolphin M.D.   On: 12/08/2013 15:50     ASSESSMENT & PLAN:  59 year old postmenopausal woman, with past medical history of coronary disease, anxiety and depression, who was found by screening mammogram to have a T1b N0 M0 stage I a invasive ductal carcinoma, grade I-II, ER/PR strongly positive, HER-2 negative, Ki-67 10%.  1.  pT1b N0 M0 stage IA invasive ductal carcinoma of right breast, grade II, ER/PR strongly positive, HER-2 negative -I reviewed her surgical pathology results and Oncotype DX test result in great details. Her tumor has been completely resected. Based on the Oncotype DX results. She has risks of breast cancer recurrence about 13% in 10 years with adjuvant tamoxifen. The recurrence score is intermediate, the benefit of chemotherapy is controversial in this range, and I do not recommend adjuvant chemotherapy. Patient voiced good understanding, and agrees with it. -Based on her strong ER/PR positivity in the tumor, I strongly recommend adjuvant hormonal therapy, to reduce her risks of cancer recurrence. She is 20, had hysterectomy long time ago, and serum estrogen and FSH are in the range of postmenopause.  -Patient is a very hesitated to take endocrine therapy, due to the concern of side effects, especially cardiovascular side effects. I explained to her that hormonal therapy is likely reduce her risks of cancer recurrence by 30-40%,  and the severe side effects from aromatase inhibitor is small, and we can always stop medication if she experiences significant side effects. -She voiced good understanding, and would like to think about it.  Plan - I'll see her back in one month and decide if you would like to try Aromasin  -I will speak with your cardiologist about your cardiovascular risk of AI  All questions were answered. The patient knows to call the clinic with any problems, questions or concerns. I spent 20 minutes counseling the patient face to face. The total time spent in the appointment was 30 minutes and more than 50% was on counseling.     Truitt Merle, MD 04/10/2014 1:51 PM

## 2014-04-11 ENCOUNTER — Ambulatory Visit
Admission: RE | Admit: 2014-04-11 | Discharge: 2014-04-11 | Disposition: A | Discharge status: Home / Self Care | Payer: Medicare Other | Source: Ambulatory Visit | Attending: Radiation Oncology | Admitting: Radiation Oncology

## 2014-04-11 ENCOUNTER — Ambulatory Visit: Payer: Medicare Other | Admitting: Radiation Oncology

## 2014-04-11 ENCOUNTER — Ambulatory Visit: Payer: Medicare Other

## 2014-04-11 VITALS — BP 123/70 | HR 51 | Temp 97.7°F | Wt 200.2 lb

## 2014-04-11 DIAGNOSIS — Z170 Estrogen receptor positive status [ER+]: Secondary | ICD-10-CM | POA: Diagnosis not present

## 2014-04-11 DIAGNOSIS — Z510 Encounter for antineoplastic radiation therapy: Secondary | ICD-10-CM | POA: Diagnosis not present

## 2014-04-11 DIAGNOSIS — Z9011 Acquired absence of right breast and nipple: Secondary | ICD-10-CM | POA: Diagnosis not present

## 2014-04-11 DIAGNOSIS — Z90710 Acquired absence of both cervix and uterus: Secondary | ICD-10-CM | POA: Diagnosis not present

## 2014-04-11 DIAGNOSIS — C50411 Malignant neoplasm of upper-outer quadrant of right female breast: Secondary | ICD-10-CM

## 2014-04-11 DIAGNOSIS — M79621 Pain in right upper arm: Secondary | ICD-10-CM | POA: Diagnosis not present

## 2014-04-11 NOTE — Progress Notes (Signed)
Weekly Management Note Current Dose: 40.05  Gy  Projected Dose: 52.72 Gy   Narrative:  The patient presents for routine under treatment assessment.  CBCT/MVCT images/Port film x-rays were reviewed.  The chart was checked. Doing well. Arm slowly improving.   Physical Findings: Weight: 200 lb 3.2 oz (90.81 kg). Unchanged. Skin slightly pink with no moist desquamation.   Impression:  The patient is tolerating radiation.  Plan:  Continue treatment as planned. Continue radiaplex.

## 2014-04-12 ENCOUNTER — Ambulatory Visit: Payer: Medicare Other | Admitting: Radiation Oncology

## 2014-04-12 ENCOUNTER — Ambulatory Visit: Payer: Medicare Other

## 2014-04-12 ENCOUNTER — Telehealth: Payer: Self-pay | Admitting: *Deleted

## 2014-04-12 ENCOUNTER — Ambulatory Visit
Admission: RE | Admit: 2014-04-12 | Discharge: 2014-04-12 | Disposition: A | Discharge status: Home / Self Care | Payer: Medicare Other | Source: Ambulatory Visit | Attending: Radiation Oncology | Admitting: Radiation Oncology

## 2014-04-12 DIAGNOSIS — C50411 Malignant neoplasm of upper-outer quadrant of right female breast: Secondary | ICD-10-CM | POA: Diagnosis not present

## 2014-04-12 DIAGNOSIS — Z9011 Acquired absence of right breast and nipple: Secondary | ICD-10-CM | POA: Diagnosis not present

## 2014-04-12 DIAGNOSIS — M79621 Pain in right upper arm: Secondary | ICD-10-CM | POA: Diagnosis not present

## 2014-04-12 DIAGNOSIS — Z90710 Acquired absence of both cervix and uterus: Secondary | ICD-10-CM | POA: Diagnosis not present

## 2014-04-12 DIAGNOSIS — Z170 Estrogen receptor positive status [ER+]: Secondary | ICD-10-CM | POA: Diagnosis not present

## 2014-04-12 DIAGNOSIS — Z510 Encounter for antineoplastic radiation therapy: Secondary | ICD-10-CM | POA: Diagnosis not present

## 2014-04-12 NOTE — Telephone Encounter (Signed)
Pt seen in breast lobby & states that she wanted written info on medications that Dr Burr Medico wanted her to start.  Medical info sheets printed on tamoxifen, arimidex & aromasin & given to pt.

## 2014-04-13 ENCOUNTER — Ambulatory Visit
Admission: RE | Admit: 2014-04-13 | Discharge: 2014-04-13 | Disposition: A | Discharge status: Home / Self Care | Payer: Medicare Other | Source: Ambulatory Visit | Attending: Radiation Oncology | Admitting: Radiation Oncology

## 2014-04-13 DIAGNOSIS — Z90710 Acquired absence of both cervix and uterus: Secondary | ICD-10-CM | POA: Diagnosis not present

## 2014-04-13 DIAGNOSIS — C50411 Malignant neoplasm of upper-outer quadrant of right female breast: Secondary | ICD-10-CM | POA: Diagnosis not present

## 2014-04-13 DIAGNOSIS — M79621 Pain in right upper arm: Secondary | ICD-10-CM | POA: Diagnosis not present

## 2014-04-13 DIAGNOSIS — Z510 Encounter for antineoplastic radiation therapy: Secondary | ICD-10-CM | POA: Diagnosis not present

## 2014-04-13 DIAGNOSIS — Z170 Estrogen receptor positive status [ER+]: Secondary | ICD-10-CM | POA: Diagnosis not present

## 2014-04-13 DIAGNOSIS — Z9011 Acquired absence of right breast and nipple: Secondary | ICD-10-CM | POA: Diagnosis not present

## 2014-04-14 ENCOUNTER — Ambulatory Visit
Admission: RE | Admit: 2014-04-14 | Discharge: 2014-04-14 | Disposition: A | Discharge status: Home / Self Care | Payer: Medicare Other | Source: Ambulatory Visit | Attending: Radiation Oncology | Admitting: Radiation Oncology

## 2014-04-14 DIAGNOSIS — Z9011 Acquired absence of right breast and nipple: Secondary | ICD-10-CM | POA: Diagnosis not present

## 2014-04-14 DIAGNOSIS — M79621 Pain in right upper arm: Secondary | ICD-10-CM | POA: Diagnosis not present

## 2014-04-14 DIAGNOSIS — Z90710 Acquired absence of both cervix and uterus: Secondary | ICD-10-CM | POA: Diagnosis not present

## 2014-04-14 DIAGNOSIS — Z170 Estrogen receptor positive status [ER+]: Secondary | ICD-10-CM | POA: Diagnosis not present

## 2014-04-14 DIAGNOSIS — C50411 Malignant neoplasm of upper-outer quadrant of right female breast: Secondary | ICD-10-CM | POA: Diagnosis not present

## 2014-04-14 DIAGNOSIS — Z510 Encounter for antineoplastic radiation therapy: Secondary | ICD-10-CM | POA: Diagnosis not present

## 2014-04-17 ENCOUNTER — Encounter: Payer: Self-pay | Admitting: Adult Health

## 2014-04-17 ENCOUNTER — Ambulatory Visit
Admission: RE | Admit: 2014-04-17 | Discharge: 2014-04-17 | Disposition: A | Discharge status: Home / Self Care | Payer: Medicare Other | Source: Ambulatory Visit | Attending: Radiation Oncology | Admitting: Radiation Oncology

## 2014-04-17 DIAGNOSIS — Z510 Encounter for antineoplastic radiation therapy: Secondary | ICD-10-CM | POA: Diagnosis not present

## 2014-04-17 DIAGNOSIS — M79621 Pain in right upper arm: Secondary | ICD-10-CM | POA: Diagnosis not present

## 2014-04-17 DIAGNOSIS — C50411 Malignant neoplasm of upper-outer quadrant of right female breast: Secondary | ICD-10-CM | POA: Diagnosis not present

## 2014-04-17 DIAGNOSIS — Z9011 Acquired absence of right breast and nipple: Secondary | ICD-10-CM | POA: Diagnosis not present

## 2014-04-17 DIAGNOSIS — Z90710 Acquired absence of both cervix and uterus: Secondary | ICD-10-CM | POA: Diagnosis not present

## 2014-04-17 DIAGNOSIS — Z170 Estrogen receptor positive status [ER+]: Secondary | ICD-10-CM | POA: Diagnosis not present

## 2014-04-17 NOTE — Progress Notes (Signed)
I met with [name] [name] briefly after her radiation therapy treatment today.  She is scheduled to complete treatment on 04/19/14.  I gave her the "Life After Cancer for Every Survivor" survivorship pamphlet, along with her follow-up appointments.   She will see Dr. Pablo Ledger for her 51-monthRadiation Oncology follow-up on: 05/25/14 at 2pm.  She will then see me in the Survivorship Clinic on: 05/25/14 at 2:30pm.  [name] given a card with these appointments written on it and she expressed verbal understanding.  She was encouraged to call with any questions/concerns she may have before her next appointment here.   GMike Craze NP [id number]

## 2014-04-18 ENCOUNTER — Ambulatory Visit
Admission: RE | Admit: 2014-04-18 | Discharge: 2014-04-18 | Disposition: A | Discharge status: Home / Self Care | Payer: Medicare Other | Source: Ambulatory Visit | Attending: Radiation Oncology | Admitting: Radiation Oncology

## 2014-04-18 ENCOUNTER — Ambulatory Visit: Payer: Medicare Other

## 2014-04-18 ENCOUNTER — Encounter: Payer: Self-pay | Admitting: Radiation Oncology

## 2014-04-18 DIAGNOSIS — Z170 Estrogen receptor positive status [ER+]: Secondary | ICD-10-CM | POA: Diagnosis not present

## 2014-04-18 DIAGNOSIS — M79621 Pain in right upper arm: Secondary | ICD-10-CM | POA: Diagnosis not present

## 2014-04-18 DIAGNOSIS — Z510 Encounter for antineoplastic radiation therapy: Secondary | ICD-10-CM | POA: Diagnosis not present

## 2014-04-18 DIAGNOSIS — Z9011 Acquired absence of right breast and nipple: Secondary | ICD-10-CM | POA: Diagnosis not present

## 2014-04-18 DIAGNOSIS — C50411 Malignant neoplasm of upper-outer quadrant of right female breast: Secondary | ICD-10-CM | POA: Diagnosis not present

## 2014-04-18 DIAGNOSIS — Z90710 Acquired absence of both cervix and uterus: Secondary | ICD-10-CM | POA: Diagnosis not present

## 2014-04-18 NOTE — Progress Notes (Signed)
  Radiation Oncology         [phone] ________________________________  Name: [name] [name] MRN: [id number]  Date: 04/18/2014  DOB: 1955-12-17  Weekly Radiation Therapy Management  Diagnosis: Right breast cancer  Current Dose: 50.72 Gy     Planned Dose:  52.72 Gy  Narrative . . . . . . . . The patient presents for routine under treatment assessment.                                   The patient has some mild discomfort in the right breast relieved with Aleve.                                 Set-up films were reviewed.                                 The chart was checked. Physical Findings. . .  vitals were not taken for this visit.. The right breast area shows mild erythema without any skin breakdown. Impression . . . . . . . The patient is tolerating radiation. Plan . . . . . . . . . . . . Continue treatment as planned.  ________________________________   Blair Promise, PhD, MD

## 2014-04-18 NOTE — Progress Notes (Signed)
Weekly rad txs 20/21 right breast completed, mild erythema, skin intact, using radiaplex bid, takes aleve prn breast pain, appetite good energy level fair, drinking plenty water, gave 1 month f/u card appt to patient, flyer on [facility], , interested in next session 2:10 PM

## 2014-04-19 ENCOUNTER — Ambulatory Visit
Admission: RE | Admit: 2014-04-19 | Discharge: 2014-04-19 | Disposition: A | Discharge status: Home / Self Care | Payer: Medicare Other | Source: Ambulatory Visit | Attending: Radiation Oncology | Admitting: Radiation Oncology

## 2014-04-19 ENCOUNTER — Encounter: Payer: Self-pay | Admitting: Radiation Oncology

## 2014-04-19 DIAGNOSIS — Z170 Estrogen receptor positive status [ER+]: Secondary | ICD-10-CM | POA: Diagnosis not present

## 2014-04-19 DIAGNOSIS — Z9011 Acquired absence of right breast and nipple: Secondary | ICD-10-CM | POA: Diagnosis not present

## 2014-04-19 DIAGNOSIS — M79621 Pain in right upper arm: Secondary | ICD-10-CM | POA: Diagnosis not present

## 2014-04-19 DIAGNOSIS — C50411 Malignant neoplasm of upper-outer quadrant of right female breast: Secondary | ICD-10-CM | POA: Diagnosis not present

## 2014-04-19 DIAGNOSIS — Z510 Encounter for antineoplastic radiation therapy: Secondary | ICD-10-CM | POA: Diagnosis not present

## 2014-04-19 DIAGNOSIS — Z90710 Acquired absence of both cervix and uterus: Secondary | ICD-10-CM | POA: Diagnosis not present

## 2014-04-20 ENCOUNTER — Encounter: Payer: Self-pay | Admitting: Neurology

## 2014-04-20 NOTE — Progress Notes (Signed)
°  Radiation Oncology         [phone] ________________________________  Name: JANARI GAGNER MRN: [id number]  Date: 04/19/2014  DOB: Feb 12, 1955  End of Treatment Note  Diagnosis:   Breast cancer of upper-outer quadrant of right female breast   Staging form: Breast, AJCC 7th Edition     Clinical stage from 01/04/2014: Stage IA (T1b, N0, M0) - Unsigned     Pathologic stage from 02/14/2014: Stage IA (T1b, N0, cM0) - Unsigned     Indication for treatment:  Curative       Radiation treatment dates:   03/22/2014-04/19/2014   Radiation Oncology         [phone] ________________________________  Name: ALIYHA FORNES MRN: [id number]  Date: 04/19/2014  DOB: July 19, 1955  End of Treatment Note  Diagnosis:  Breast cancer of upper-outer quadrant of right female breast   Staging form: Breast, AJCC 7th Edition     Clinical stage from 01/04/2014: Stage IA (T1b, N0, M0) - Unsigned     Pathologic stage from 02/14/2014: Stage IA (T1b, N0, cM0) - Unsigned    Indication for treatment:  Curative    Site/dose:   Right breast/ 42.72 Gy at 2.67 Gy per fraction x 21 fractions.  Right breast boost/ 10 Gy at 2 Gy per fraction x 5 fractions  Beams/energy:  Opposed tangents with reduced fields / 6 MV photons Enface electrons / 15 MeV  Narrative: The patient tolerated radiation treatment relatively well.   She had some fatigue and some arm pain for which she was referred to physical therapy.   Plan: The patient has completed radiation treatment. The patient will return to radiation oncology clinic for routine followup in one month. I advised them to call or return sooner if they have any questions or concerns related to their recovery or treatment.  ------------------------------------------------  Thea Silversmith, MD

## 2014-04-21 ENCOUNTER — Ambulatory Visit (HOSPITAL_COMMUNITY)
Admission: RE | Admit: 2014-04-21 | Discharge: 2014-04-21 | Disposition: A | Discharge status: Home / Self Care | Payer: Medicare Other | Source: Ambulatory Visit | Attending: Hematology | Admitting: Hematology

## 2014-04-21 DIAGNOSIS — C50411 Malignant neoplasm of upper-outer quadrant of right female breast: Secondary | ICD-10-CM | POA: Insufficient documentation

## 2014-04-21 DIAGNOSIS — M8589 Other specified disorders of bone density and structure, multiple sites: Secondary | ICD-10-CM | POA: Diagnosis not present

## 2014-04-21 DIAGNOSIS — Z780 Asymptomatic menopausal state: Secondary | ICD-10-CM | POA: Diagnosis not present

## 2014-04-21 DIAGNOSIS — Z13820 Encounter for screening for osteoporosis: Secondary | ICD-10-CM | POA: Diagnosis not present

## 2014-04-28 DIAGNOSIS — F331 Major depressive disorder, recurrent, moderate: Secondary | ICD-10-CM | POA: Diagnosis not present

## 2014-05-09 ENCOUNTER — Telehealth: Payer: Self-pay | Admitting: Hematology

## 2014-05-09 ENCOUNTER — Ambulatory Visit (HOSPITAL_BASED_OUTPATIENT_CLINIC_OR_DEPARTMENT_OTHER): Payer: Medicare Other | Admitting: Hematology

## 2014-05-09 ENCOUNTER — Encounter: Payer: Self-pay | Admitting: Hematology

## 2014-05-09 VITALS — BP 107/51 | HR 57 | Temp 97.8°F | Resp 18 | Ht 68.0 in | Wt 205.7 lb

## 2014-05-09 DIAGNOSIS — C50811 Malignant neoplasm of overlapping sites of right female breast: Secondary | ICD-10-CM

## 2014-05-09 DIAGNOSIS — C50411 Malignant neoplasm of upper-outer quadrant of right female breast: Secondary | ICD-10-CM

## 2014-05-09 DIAGNOSIS — Z170 Estrogen receptor positive status [ER+]: Secondary | ICD-10-CM | POA: Diagnosis not present

## 2014-05-09 DIAGNOSIS — M8580 Other specified disorders of bone density and structure, unspecified site: Secondary | ICD-10-CM | POA: Diagnosis not present

## 2014-05-09 NOTE — Telephone Encounter (Signed)
gave and printed appt sched and avs fo rpt for OCT. °

## 2014-05-09 NOTE — Progress Notes (Signed)
[facility]  [phone] Fax:([phone]) [place] Note   Patient Care Team: Abner Greenspan, MD as PCP - General Minus Breeding, MD as Consulting Physician (Cardiology) Holley Bouche, NP as Nurse Practitioner (Nurse Practitioner) Erroll Luna, MD as Consulting Physician (General Surgery) Thea Silversmith, MD as Consulting Physician (Radiation Oncology) Truitt Merle, MD as Consulting Physician (Hematology) 05/09/2014  CHIEF COMPLAINTS Follow up stage IA right breast cancer, pT1bN0    Breast cancer of upper-outer quadrant of right female breast   12/29/2013 Imaging Screening mammogram and [place]: an irregular shadowing hypoechoic mass right breast 9 o'clock location 4 cm from the nipple measuring 7 x 7 x 7 mm. No adenopathy    12/29/2013 Initial Diagnosis Breast cancer of upper-outer quadrant of right female breast   12/29/2013 Initial Biopsy Grade I-II IDA, ER+/PR+/HER2-, Ki67 10%    02/14/2014 Surgery Right lumpectomy; grade 2 IDC spanning 0.9 cm with associated grade 2 DCIS.  5 sentinel lymph nodes negative.  HER2 repeated; results pending.    02/14/2014 Pathologic Stage pT1cpN0; Stage IA   02/14/2014 Oncotype testing RS 21, no adjuvant chemo offered.    03/22/2014 -  Radiation Therapy Adjuvant radiation     HISTORY OF PRESENTING ILLNESS:  [name] [name] 59 y.o. female presents to our breast multidisciplinary clinic today because of newly diagnosed breast cancer.   This was discovered by routine screening mammogram a few weeks ago. She underwent diagnostic mammogram and ultrasound on 12/29/2013 which showed an irregular shadowing hypoechoic mass in right breast likely position measuring 7 mm. No adenopathy. She underwent core needle biopsy on the same day, which showed grade 1-2 invasive ductal adenocarcinoma, ER positive PR positive and HER-2 negative, Ki-67 10%.  She has been feeling very anxious since the biopsy. She does have history of coronary  artery disease, status post stent placement in [facility] 7. Since the breast cancer diagnosis, she has had several episodes of anxiety attack, hypoventilation, with chest tightness. She appeared quite nervous when I met her first in the clinic. Before the biopsy, she felt well in general, she does have intermittent migraine headaches, chronic leg swollen, intermittent chest pain, denies any other new symptoms, no weight loss or other changes.  She lives with her only son, who is 5 years old, on disability due to depression and suicidal ideas. She is his caregiver.  INTERIM HISTORY: She returns for follow up. She has finished radiation on 3/23. She complains right shoulder and arm pain, which started after her breast surgery and got worse after radiation. She has limited range of motion on right shoulder, no arm edema. She also has mild nausea in the morning, no vomiting. These symptoms are usually worse in the morning and get better during the day. No other new complains. She has great appetite and eats well, and she actually gained 5 lbs since 3 weeks ago. She does exercise regularly.   MEDICAL HISTORY:  Past Medical History  Diagnosis Date  . Depression   . Anorexia   . Migraine   . GERD (gastroesophageal reflux disease)   . Seasonal allergies   . Arrhythmia   . CAD (coronary artery disease), s/p stent placement  2007   . Frequent UTI   . Edema   . Vitamin D deficiency   . Restless leg syndrome   . Microscopic hematuria   . Hypothyroid   She has psychologist Dr. Rica Mote   SURGICAL HISTORY: Past Surgical History  Procedure Laterality Date  .  Cholecystectomy open    . Appendectomy    . Abdominal hysterectomy  1983  . Back surgery  1997    herniated disc repair  . Cystoscopy      neg  Back surgery in 1997   SOCIAL HISTORY: History   Social History  . Marital Status: Widowed, husband died of colon cancer in 41     Spouse Name: N/A    Number of Children: One son  45 yo, on  disability for depression, who lives with her   . Years of Education: N/A   Occupational History  . Secretary, Clinical cytogeneticist, retired     Social History Main Topics  . Smoking status: Former Smoker    Start date: 10/14/1971  . Smokeless tobacco: Never Used  . Alcohol Use: Yes     Comment: socially  . Drug Use: No  . Sexual Activity: Not on file   Other Topics Concern  . Not on file   Social History Narrative   Does not get any regular exercise      Doctors   -uro-Ottlein   -cardio--hochrein   -Neurology--Dr. Jannifer Franklin   -allergies--Dr. Carmelina Peal   Gyn past--Dr. Warnell Forester   Couselor--Dr. Mitchum   Endo-- Dr. Chalmers Cater      Lives with son who is 9 years old    FAMILY HISTORY: Family History  Problem Relation Age of Onset  . Coronary artery disease Mother   . Thyroid disease Mother   . Alzheimer's disease Mother   . Diabetes Mother   . Heart disease Mother   . Cancer Brother     melnoma  . Ovarian cancer Paternal Aunt 41  . Bone cancer Paternal Uncle   . Alzheimer's disease Maternal Grandmother   . Heart disease Maternal Grandfather   . Pancreatic cancer Maternal Grandfather 22  . Ovarian cancer Maternal Aunt     dx in 6s  . Throat cancer Maternal Uncle     smoker  . Lung cancer Maternal Uncle     heavy smoker  . Ovarian cancer Paternal Aunt     dx in her 47s  . Cancer Cousin     "female cancer"   GYN HISTORY  Menarchal: 10 LMP: 1983 hysrectomy  Contraceptive: 2 years  HRT: was on for 10-20 years, off several years  GxP1: several miscarriages     ALLERGIES:  is allergic to iohexol.  MEDICATIONS:  Current Outpatient Prescriptions  Medication Sig Dispense Refill  . aspirin 325 MG tablet Take 325 mg by mouth daily.     . cephALEXin (KEFLEX) 250 MG capsule Take 250 mg by mouth daily.      Marland Kitchen CINNAMON PO Take 1,000 mg by mouth daily.     . Coenzyme Q10 (CO Q-10) 200 MG CAPS Take 1 capsule by mouth daily.      . divalproex (DEPAKOTE) 250 MG DR tablet Take 1  tablet by mouth daily for 1 week then increase to 1 tablet by mouth twice a day. 60 tablet 3  . fish oil-omega-3 fatty acids 1000 MG capsule Take 1.2 g by mouth 2 (two) times daily.    . furosemide (LASIX) 20 MG tablet Take 1 tablet (20 mg total) by mouth daily. 90 tablet 3  . Ginkgo Biloba 120 MG CAPS Take 1 capsule by mouth daily.      Marland Kitchen levothyroxine (SYNTHROID, LEVOTHROID) 112 MCG tablet Take 112 mcg by mouth daily.    . Multiple Vitamin (MULTIVITAMIN) capsule Take 1 capsule by mouth daily.    Marland Kitchen  nitroGLYCERIN (NITROSTAT) 0.4 MG SL tablet Place 0.4 mg under the tongue every 5 (five) minutes as needed. Pain    . pantoprazole (PROTONIX) 40 MG tablet Take 1 tablet (40 mg total) by mouth 2 (two) times daily. 60 tablet 11  . potassium chloride SA (KLOR-CON M20) 20 MEQ tablet TAKE 2 TABLETS BY MOUTH TWICE A DAY AND 1/2 TABLET AT BEDTIME 135 tablet 11  . propranolol ER (INDERAL LA) 120 MG 24 hr capsule TAKE ONE CAPSULE BY MOUTH EVERY DAY AT BEDTIME 30 capsule 5  . ranitidine (ZANTAC) 300 MG tablet Take 1 tablet (300 mg total) by mouth 2 (two) times daily. 60 tablet 11  . simvastatin (ZOCOR) 40 MG tablet TAKE 1 TABLET BY MOUTH AT BEDTIME 30 tablet 9  .      . topiramate (TOPAMAX) 50 MG tablet Take 1 tablet  in the AM & 2 tablets in the PM for 1 week. Then to 1 tablet twice a day for 1 week, then 1 tablet daily for 1 week then stop. 50 tablet 0  . trimethoprim (TRIMPEX) 100 MG tablet Take 100 mg by mouth at bedtime.     No current facility-administered medications for this visit.    REVIEW OF SYSTEMS:   Constitutional: Denies fevers, chills or abnormal night sweats Eyes: Denies blurriness of vision, double vision or watery eyes Ears, nose, mouth, throat, and face: Denies mucositis or sore throat Respiratory: Denies cough, dyspnea or wheezes Cardiovascular: (+) palpitation and intermittent chest pain, (+) lower extremity swelling Gastrointestinal:  Denies nausea, heartburn or change in bowel  habits Skin: Denies abnormal skin rashes Lymphatics: Denies new lymphadenopathy or easy bruising Neurological:Denies numbness, tingling or new weaknesses Behavioral/Psych: (+) very anxious All other systems were reviewed with the patient and are negative.  PHYSICAL EXAMINATION: ECOG PERFORMANCE STATUS: 0 - Asymptomatic  Filed Vitals:   05/09/14 1309  BP: 107/51  Pulse: 57  Temp: 97.8 F (36.6 C)  Resp: 18   Filed Weights   05/09/14 1309  Weight: 205 lb 11.2 oz (93.305 kg)    GENERAL:alert, no distress and comfortable SKIN: skin color, texture, turgor are normal, no rashes or significant lesions EYES: normal, conjunctiva are pink and non-injected, sclera clear OROPHARYNX:no exudate, no erythema and lips, buccal mucosa, and tongue normal  NECK: supple, thyroid normal size, non-tender, without nodularity LYMPH:  no palpable lymphadenopathy in the cervical, axillary or inguinal LUNGS: clear to auscultation and percussion with normal breathing effort HEART: regular rate & rhythm and no murmurs and no lower extremity edema ABDOMEN:abdomen soft, non-tender and normal bowel sounds Musculoskeletal:no cyanosis of digits and no clubbing  PSYCH: alert & oriented x 3 with fluent speech NEURO: no focal motor/sensory deficits Breasts: Breast inspection showed them to be symmetrical with no nipple discharge. Palpation of the breasts and axilla revealed no obvious mass that I could appreciate. Surgical scar in the right breast is well-healed. (+) Mild skin redness and pigmentation, thickness of skin in the right breast secondary to radiation.   LABORATORY DATA:  I have reviewed the data as listed Lab Results  Component Value Date   WBC 4.9 04/03/2014   HGB 13.2 04/03/2014   HCT 40.0 04/03/2014   MCV 91.4 04/03/2014   PLT 175 04/03/2014    Recent Labs  11/16/13 1514 11/18/13 1438 01/04/14 0823 02/13/14 1100 04/03/14 1317  NA  --  142 144 142 144  K  --  4.2 4.2 4.5 4.5  CL  --   110  --  107  --   CO2  --  22 20* 25 23  GLUCOSE  --  116* 183* 120* 114  BUN  --  23 21.3 14 25.2  CREATININE  --  1.35* 1.6* 1.24* 1.3*  CALCIUM  --  9.4 10.1 9.9 9.8  GFRNONAA  --   --   --  47*  --   GFRAA  --   --   --  54*  --   PROT 7.3 6.6 7.2 7.4 7.1  ALBUMIN  --  4.1 4.1 4.2 4.0  AST '26 20 22 26 21  ' ALT '28 22 26 26 20  ' ALKPHOS 54 47 57 54 59  BILITOT 1.3* 1.4* 1.33* 1.3* 1.21*  BILIDIR 0.29  --   --   --   --    PATHOLOGY REPORT 12/30/2013 Diagnosis 1. Breast, lumpectomy, Right 02/14/2014 - INVASIVE GRADE II DUCTAL CARCINOMA SPANNING 0.9 CM IN GREATEST DIMENSION. - ASSOCIATED INTERMEDIATE GRADE DUCTAL CARCINOMA IN SITU. - MARGINS ARE NEGATIVE. - SEE ONCOLOGY TEMPLATE. 2. Breast, excision, Right - BENIGN BREAST PARENCHYMA WITH SCATTERED ACUTE AND CHRONIC INFLAMMATION. - NO ATYPIA, HYPERPLASIA, OR MALIGNANCY IDENTIFIED. 3. Lymph node, sentinel, biopsy, Right axillary - ONE BENIGN LYMPH NODE WITH NO TUMOR SEEN (0/1). 4. Lymph node, sentinel, biopsy, Right axillary - ONE BENIGN LYMPH NODE WITH NO TUMOR SEEN (0/1). 5. Lymph node, sentinel, biopsy, Right axillary - ONE BENIGN LYMPH NODE WITH NO TUMOR SEEN (0/1). 6. Lymph node, sentinel, biopsy, Right axillary - ONE BENIGN LYMPH NODE WITH NO TUMOR SEEN (0/1). 7. Lymph node, sentinel, biopsy, Right axillary - ONE BENIGN LYMPH NODE WITH NO TUMOR SEEN (0/1). 1. BREAST, INVASIVE TUMOR, WITH LYMPH NODES PRESENT Specimen, including laterality and lymph node sampling (sentinel, non-sentinel): Right partial breast with right axillary sentinel lymph node sampling. Procedure: Right breast lumpectomy with right axillary sentinel lymph node mapping and biopsy. Histologic type: Invasive ductal carcinoma. Grade: II. Tubule formation: 3. Nuclear pleomorphism: 2. Mitotic:1. Tumor size (gross measurement and microscopic measurement): 0.9 cm. 1 of 4 FINAL for [name], [name] ([id number]) Microscopic  Comment(continued) Margins: Invasive, distance to closest margin: 0.4 cm (medial margin). In-situ, distance to closest margin: At least 0.4 cm (medial margin). Lymphovascular invasion: Definitive lymph/vascular invasion is not identified. Ductal carcinoma in situ: Yes, present. Grade: Intermediate grade. Extensive intraductal component: No. Lobular neoplasia: Not identified. Tumor focality: Unifocal. Treatment effect: Not applicable. Extent of tumor: Tumor confined to breast parenchyma. Skin: Not received. Nipple: Not received. Skeletal muscle: Not received. Lymph nodes: Examined: 5 Sentinel. 5 Non-sentinel. 5 Total. Lymph nodes with metastasis: 0. Isolated tumor cells (< 0.2 mm): 0. Micrometastasis: (> 0.2 mm and < 2.0 mm): 0. Macrometastasis: (> 2.0 mm): 0. Extracapsular extension: Not applicable. Breast prognostic profile: Performed on previous biopsy ([id number]) Estrogen receptor: 98%, positive. Progesterone receptor: 81%, positive. Her 2 neu by CISH: 1.24 ratio, not amplified. Ki-67: 15% Non-neoplastic breast: Fat necrosis. TNM: pT1b, pN0.  ONCOTYPE DX RS score: 21    RADIOGRAPHIC STUDIES: I have personally reviewed the radiological images as listed and agreed with the findings in the report.  US Breast Ltd Uni Right Inc Axilla 12/29/2013      FINDINGS: On physical exam, I palpate no abnormality in the right breast 9 o'clock location.  Ultrasound is performed, showing an irregular shadowing hypoechoic mass right breast 9 o'clock location 4 cm from the nipple measuring 7 x 7 x 7 mm. This corresponds to the mammographic abnormality.  There is no right axillary lymphadenopathy.  IMPRESSION: Suspicious right breast mass 9 o'clock location. Ultrasound-guided core biopsy will be performed today and dictated separately.  RECOMMENDATION: Ultrasound-guided right breast biopsy  I have discussed the findings and recommendations with the patient. Results were also provided in  writing at the conclusion of the visit. If applicable, a reminder letter will be sent to the patient regarding the next appointment.  BI-RADS CATEGORY  4: Suspicious.   Electronically Signed   By: Conchita Paris M.D.   On: 12/29/2013 15:59   Mm Screening Breast Tomo Bilateral 12/08/2013      FINDINGS: In the right breast, a possible mass warrants further evaluation with ultrasound. In the left breast, no findings suspicious for malignancy. Images were processed with CAD.   IMPRESSION: Further evaluation is suggested for possible mass in the right breast.  RECOMMENDATION: Ultrasound of the right breast. (Code:US-R-20M)  The patient will be contacted regarding the findings, and additional imaging will be scheduled.  BI-RADS CATEGORY  0: Incomplete. Need additional imaging evaluation and/or prior mammograms for comparison.   Electronically Signed   By: Curlene Dolphin M.D.   On: 12/08/2013 15:50     ASSESSMENT & PLAN:  59 year old postmenopausal woman, with past medical history of coronary disease, anxiety and depression, who was found by screening mammogram to have a T1b N0 M0 stage I a invasive ductal carcinoma, grade I-II, ER/PR strongly positive, HER-2 negative, Ki-67 10%.  1.  pT1b N0 M0 stage IA invasive ductal carcinoma of right breast, grade II, ER/PR strongly positive, HER-2 negative, Oncotype RS 21 -I reviewed her surgical pathology results and Oncotype DX test result in great details. Her tumor has been completely resected. Based on the Oncotype DX results. She has risks of breast cancer recurrence about 13% in 10 years with adjuvant tamoxifen. The recurrence score is intermediate, the benefit of chemotherapy is controversial in this range, and I do not recommend adjuvant chemotherapy. Patient voiced good understanding, and agrees with it. -Based on her strong ER/PR positivity in the tumor, I strongly recommend adjuvant hormonal therapy, to reduce her risks of cancer recurrence. She is 62, had  hysterectomy and oophorectomy long time ago. -Patient is a very hesitated to take endocrine therapy, due to the concern of side effects, especially cardiovascular side effects. I explained to her that hormonal therapy is likely reduce her risks of cancer recurrence by 30-40%, and the severe side effects from aromatase inhibitor is small, and we can always stop medication if she experiences significant side effects. -I discussed this with her cardiologist Dr.  Percival Spanish, who thinks she may have 1-3% risk of of cardiac event from her 5 year of AI therapy. He agrees with AI therapy.  -After lengthy discussion, pt declined adjuvant endocrine therapy. -We discussed the surveillance plan. She will see me every 6 months for the first few years, then you annually afterwards. She'll continue annual screening mammogram. -I'll refer her to our survivorship clinic, she agrees.  2. Osteopenia -She will continue calcium and vitamin D supplement.  3. Genetics -Given her strong family history of ovarian cancer, she was seen by genetic counselor -Negative BRCAPlus testing from [employer].  Genes tested include: BRCA1, BRCA2, CDH1, PTEN, PALB2, and TP53.  Report date is 01/25/14.   BRIP1 c.4921G>C VUS found on the CancerNext panel.  The CancerNext gene panel offered by [facility] and includes sequencing and rearrangement analysis for the following 32 genes:   APC, ATM, BARD1, BMPR1A, BRCA1, BRCA2, BRIP1, CDH1, CDK4, CDKN2A, CHEK2, EPCAM, GREM1, MLH1, MRE11A,  MSH2, MSH6, MUTYH, NBN, NF1, PALB2, PMS2, POLD1, POLE, PTEN, RAD50, RAD51D, SMAD4, SMARCA4, STK11, and TP53.  The report date is: February 13, 2014.  4. She'll continue follow-up with her primary care physician and cardiologist for her other medical problems.  Plan - I'll see her back in 6 month for lab and physical exam.  All questions were answered. The patient knows to call the clinic with any problems, questions or concerns. I spent 20 minutes  counseling the patient face to face. The total time spent in the appointment was 30 minutes and more than 50% was on counseling.     Truitt Merle, MD 05/09/2014 1:36 PM

## 2014-05-09 NOTE — Telephone Encounter (Signed)
Gave and printed appt sched and avs for pt for April and OCT

## 2014-05-19 NOTE — Progress Notes (Signed)
Name: FERMINA MISHKIN   MRN: [id number]  Date:  04/19/2014   DOB: 1956/01/05  Status:outpatient    DIAGNOSIS: Breast cancer of upper-outer quadrant of right female breast   Staging form: Breast, AJCC 7th Edition     Clinical stage from 01/04/2014: Stage IA (T1b, N0, M0) - Unsigned     Pathologic stage from 02/14/2014: Stage IA (T1b, N0, cM0) - Unsigned   CONSENT VERIFIED: yes   SET UP: Patient is setup supine   IMMOBILIZATION:  The following immobilization was used:Custom Moldable Pillow, breast board.   NARRATIVE: Valor [place] Pursley underwent complex simulation and treatment planning for her boost treatment today.  Her tumor volume was outlined on the planning CT scan. The depth of her cavity was felt to be appropriate for treatment with electrons    15  MeV electrons will be prescribed to the 100%  isodose line.   I personally oversaw and approved the construction of a unique block which will be used for beam modification purposes.  An isodose plan is requested.

## 2014-05-22 DIAGNOSIS — E890 Postprocedural hypothyroidism: Secondary | ICD-10-CM | POA: Diagnosis not present

## 2014-05-22 DIAGNOSIS — F331 Major depressive disorder, recurrent, moderate: Secondary | ICD-10-CM | POA: Diagnosis not present

## 2014-05-22 DIAGNOSIS — E780 Pure hypercholesterolemia: Secondary | ICD-10-CM | POA: Diagnosis not present

## 2014-05-22 DIAGNOSIS — E559 Vitamin D deficiency, unspecified: Secondary | ICD-10-CM | POA: Diagnosis not present

## 2014-05-23 ENCOUNTER — Ambulatory Visit: Payer: Medicare Other | Attending: Hematology | Admitting: Physical Therapy

## 2014-05-23 DIAGNOSIS — M25611 Stiffness of right shoulder, not elsewhere classified: Secondary | ICD-10-CM | POA: Diagnosis not present

## 2014-05-23 DIAGNOSIS — R29898 Other symptoms and signs involving the musculoskeletal system: Secondary | ICD-10-CM

## 2014-05-23 DIAGNOSIS — M25511 Pain in right shoulder: Secondary | ICD-10-CM | POA: Diagnosis not present

## 2014-05-23 DIAGNOSIS — Z9189 Other specified personal risk factors, not elsewhere classified: Secondary | ICD-10-CM | POA: Insufficient documentation

## 2014-05-23 NOTE — Therapy (Addendum)
[place], [place], [address] Phone: [phone]   Fax:  [phone]  Physical Therapy Evaluation  Patient Details  Name: [name] [name] MRN: [id number] Date of Birth: 1955-09-05 Referring Provider:  Truitt Merle, MD  Encounter Date: 05/23/2014      PT End of Session - 05/23/14 1802    Visit Number 1   Number of Visits 19   Date for PT Re-Evaluation 07/22/14   PT Start Time 1440   PT Stop Time 1529   PT Time Calculation (min) 49 min      Past Medical History  Diagnosis Date  . Depression   . Anorexia   . Migraine   . GERD (gastroesophageal reflux disease)   . Seasonal allergies   . Arrhythmia   . CAD (coronary artery disease)   . Frequent UTI   . Edema   . Vitamin D deficiency   . Restless leg syndrome   . Microscopic hematuria   . Hypothyroid   . Breast cancer     ER+/PR+/Her2-  . Wears glasses   . Anxiety   . Stented coronary artery     Past Surgical History  Procedure Laterality Date  . Cholecystectomy open    . Appendectomy    . Abdominal hysterectomy    . Back surgery  1997    herniated disc repair  . Cystoscopy      neg  . Cardiac catheterization  2007    stent  . Radioactive seed guided mastectomy with axillary sentinel lymph node biopsy Right 02/14/2014    Procedure: RADIOACTIVE SEED GUIDED PARTIAL MASTECTOMY WITH AXILLARY SENTINEL LYMPH NODE BIOPSY;  Surgeon: Erroll Luna, MD;  Location: [facility];  Service: General;  Laterality: Right;    There were no vitals filed for this visit.  Visit Diagnosis:  Pain in joint of right shoulder region - Plan: PT plan of care cert/re-cert  Stiffness of joint, shoulder region, right - Plan: PT plan of care cert/re-cert  Weakness of right arm - Plan: PT plan of care cert/re-cert  At risk for lymphedema - Plan: PT plan of care cert/re-cert      Subjective Assessment - 05/23/14 1448    Pertinent History Right  breast cancer diagnosed 12/29/13; underwent lumpectomy 02/14/14; completed radiation treatment end of March 2016; pt. declined taking a follow-up pill due to other medical concerns. Cardiac stent placed 2007; has NTG and sometimes forgets to carry it;   Has seen a nephrologist and pt. describes being diagnosed with kidney disease, but is not under certain treatment for it except drinking water.  Migraines.  h/o left rotator cuff injury with physical therapy for that and successful outcome without surgery.                                                         Patient Stated Goals Check for lymphedema and learn what I can do to get this well.  (Left arm pain.)   Currently in Pain? Yes   Pain Score 2   up to 10   Pain Location Arm   Pain Orientation Right;Upper;Medial  sometimes down to thumb   Pain Descriptors / Indicators Sharp;Other (Comment)  and continuous   Aggravating Factors  worse at night;    Pain Relieving Factors change  of arm position, moving the arm around; briefly better with pillow support   Effect of Pain on Daily Activities can't sleep at night            [facility] PT Assessment - 05/23/14 0001    Assessment   Medical Diagnosis right breast cancer s/p lumpectomy   Precautions   Precautions Other (comment)   Precaution Comments cancer precautions   Restrictions   Weight Bearing Restrictions No   Balance Screen   Has the patient fallen in the past 6 months No   Has the patient had a decrease in activity level because of a fear of falling?  No   Is the patient reluctant to leave their home because of a fear of falling?  No   Home Environment   Living Enviornment Private residence   Type of [place] Two level   Prior Function   Level of Independence Independent with basic ADLs;Independent with homemaking with ambulation;Independent with gait   Vocation Unemployed   Leisure treadmill daily for thirty minutes  even during radiation   Observation/Other  Assessments   Other Surveys  Select   ROM / Strength   AROM / PROM / Strength AROM;PROM;Strength   AROM   AROM Assessment Site Shoulder   Right/Left Shoulder Right;Left   Right Shoulder Flexion 98 Degrees  painful   Right Shoulder ABduction 80 Degrees  trembles   Right Shoulder Internal Rotation 74 Degrees  supine   Right Shoulder External Rotation 85 Degrees  supine   Left Shoulder Flexion 162 Degrees   Left Shoulder ABduction 176 Degrees   Left Shoulder Internal Rotation 80 Degrees  supine, approx.   Left Shoulder External Rotation 85 Degrees  supine, approx.   PROM   PROM Assessment Site Shoulder   Right/Left Shoulder Right   Right Shoulder Flexion 145 Degrees   Right Shoulder ABduction 105 Degrees   Strength   Overall Strength Comments right shoulder grossly 3-/5 (both weak and painful)   Strength Assessment Site Shoulder   Right/Left Shoulder Right           LYMPHEDEMA/ONCOLOGY QUESTIONNAIRE - 05/23/14 1501    Type   Cancer Type right breast    Surgeries   Lumpectomy Date 02/14/14   Number Lymph Nodes Removed 5   Treatment   Past Radiation Treatment Yes   Date 04/19/14  approx.   What other symptoms do you have   Are you having pitting edema No   Lymphedema Assessments   Lymphedema Assessments Upper extremities   Right Upper Extremity Lymphedema   10 cm Proximal to Olecranon Process 36 cm   Olecranon Process 29.1 cm   10 cm Proximal to Ulnar Styloid Process 27.4 cm   Just Proximal to Ulnar Styloid Process 18.9 cm   Across Hand at [employer] 21 cm   At [place] of 2nd Digit 6.7 cm   Left Upper Extremity Lymphedema   10 cm Proximal to Olecranon Process 33.7 cm   Olecranon Process 28.2 cm   10 cm Proximal to Ulnar Styloid Process 26.1 cm   Just Proximal to Ulnar Styloid Process 19.1 cm   Across Hand at [employer] 20.4 cm   At [place] of 2nd Digit 6.6 cm           Quick Dash - 05/23/14 0001    Open a tight or new jar Moderate difficulty   Do  heavy household chores (wash walls, wash floors) Moderate difficulty  Carry a shopping bag or briefcase Mild difficulty   Wash your back Mild difficulty   Use a knife to cut food No difficulty   Recreational activities in which you take some force or impact through your arm, shoulder, or hand (golf, hammering, tennis) Severe difficulty   During the past week, to what extent has your arm, shoulder or hand problem interfered with your normal social activities with family, friends, neighbors, or groups? Extremely   During the past week, to what extent has your arm, shoulder or hand problem limited your work or other regular daily activities Modererately   Arm, shoulder, or hand pain. Severe   Tingling (pins and needles) in your arm, shoulder, or hand Moderate   Difficulty Sleeping So much difficuSo much difficulty, I can't sleep   DASH Score 54.55 %                        Short Term Clinic Goals - 05/23/14 1808    CC Short Term Goal  #1   Title independent with initial hep for shoulder ROM and strengthening   Time 4   Period Weeks   Status New   CC Short Term Goal  #2   Title will report at least 40% decrease in right arm pain   Baseline 2-10/10 pain reported   Time 4   Period Weeks   Status New             Long Term Clinic Goals - 05/23/14 1809    CC Long Term Goal  #1   Title independent with more advanced HEP   Time 8   Period Weeks   Status New   CC Long Term Goal  #2   Title report at least 75% decrease in right arm pain   Time 8   Period Weeks   Status New   CC Long Term Goal  #3   Title right shoulder flexion to at least 140 degrees for improved overhead reach   Baseline 98 degrees right, 162 left   Time 8   Period Weeks   Status New   CC Long Term Goal  #4   Title right shoulder active abduction to at least 140 degrees for improved ADLs   Baseline 80 degrees right, 176 left   Time 8   Period Weeks   Status New            Plan -  05/23/14 1803    Clinical Impression Statement Patient with significant pain, limited ROM and decreased strength and function in right UE should benefit from physical therapy to address and minimize these issues.  She is s/p right breast cancer with lumpectomy and radiation, with worsening of these symptoms recently after completion of radiation treatments.  Currently she does not appear to have right UE lymphedema, but this bears watching.   Pt will benefit from skilled therapeutic intervention in order to improve on the following deficits Pain;Decreased range of motion;Decreased strength;Impaired UE functional use;Other (comment);Decreased knowledge of precautions  at risk for lymphedema   Rehab Potential Good   PT Frequency 2x / week   PT Duration 8 weeks   PT Treatment/Interventions Therapeutic exercise;Passive range of motion;Manual techniques;Patient/family education;Other (comment)  lymphedema risk reduction practice education   PT Next Visit Plan Begin HEP instruction (start with checking on ABC class stretches) for right shoulder ROM; begin manual therapy (PROM, myofascial release).   Consulted and Agree with Plan of Care Patient  G-Codes - 05/23/14 1810    Functional Assessment Tool Used quick dash   Functional Limitation Carrying, moving and handling objects   Carrying, Moving and Handling Objects Current Status [phone]) At least 40 percent but less than 60 percent impaired, limited or restricted   Carrying, Moving and Handling Objects Goal Status ([id number]) At least 1 percent but less than 20 percent impaired, limited or restricted       Problem List Patient Active Problem List   Diagnosis Date Noted  . Intractable chronic migraine without aura and without status migrainosus 03/15/2014  . Genetic testing 01/25/2014  . Breast cancer   . Breast cancer of upper-outer quadrant of right female breast 12/30/2013  . Hypothyroid 12/10/2011  . Adverse effects of medication  10/28/2010  . Routine general medical examination at a health care facility 09/30/2010  . PERIODIC LIMB MOVEMENT DISORDER 08/16/2008  . HYPERSOMNIA 06/21/2008  . Somnolence, daytime 06/07/2008  . UNSPECIFIED VITAMIN D DEFICIENCY 04/07/2008  . UTI'S, CHRONIC 11/05/2007  . Renal insufficiency 09/16/2007  . Other abnormal glucose 09/16/2007  . HEMATURIA, MICROSCOPIC, HX OF 09/16/2007  . OTHER ABNORMAL BLOOD CHEMISTRY 09/09/2007  . Hyperlipidemia 08/05/2007  . OBESITY 08/05/2007  . DEPRESSIVE DISORDER 08/05/2007  . Intractable chronic migraine without aura 08/05/2007  . C A D 08/05/2007  . GERD 08/05/2007  . DEGENERATIVE DISC DISEASE, LUMBAR SPINE 08/05/2007  . EDEMA 08/05/2007    SALISBURY,DONNA 05/23/2014, 6:13 PM  [place], [place], [address] Phone: [phone]   Fax:  [place], PT 05/23/2014 6:13 PM   PHYSICAL THERAPY DISCHARGE SUMMARY  Visits from Start of Care: 1  Current functional level related to goals / functional outcomes: Unknown: patient did not continue therapy due to other medical concerns.   Remaining deficits: unknown   Education / Equipment: about treatment options Plan: Patient agrees to discharge.  Patient goals were not met. Patient is being discharged due to a change in medical status.  ?????    Serafina Royals, PT 02/19/2015 10:06 AM

## 2014-05-25 ENCOUNTER — Ambulatory Visit
Admission: RE | Admit: 2014-05-25 | Discharge: 2014-05-25 | Disposition: A | Discharge status: Home / Self Care | Payer: Medicare Other | Source: Ambulatory Visit | Attending: Radiation Oncology | Admitting: Radiation Oncology

## 2014-05-25 ENCOUNTER — Encounter: Payer: Self-pay | Admitting: Radiation Oncology

## 2014-05-25 ENCOUNTER — Ambulatory Visit (HOSPITAL_BASED_OUTPATIENT_CLINIC_OR_DEPARTMENT_OTHER): Payer: Medicare Other | Admitting: Adult Health

## 2014-05-25 VITALS — BP 115/50 | HR 56 | Temp 97.8°F | Wt 206.2 lb

## 2014-05-25 VITALS — BP 115/50 | HR 56 | Temp 97.8°F

## 2014-05-25 DIAGNOSIS — C50411 Malignant neoplasm of upper-outer quadrant of right female breast: Secondary | ICD-10-CM

## 2014-05-25 DIAGNOSIS — F4322 Adjustment disorder with anxiety: Secondary | ICD-10-CM | POA: Diagnosis not present

## 2014-05-25 DIAGNOSIS — M79601 Pain in right arm: Secondary | ICD-10-CM | POA: Diagnosis not present

## 2014-05-25 DIAGNOSIS — Z853 Personal history of malignant neoplasm of breast: Secondary | ICD-10-CM

## 2014-05-25 HISTORY — DX: Reserved for concepts with insufficient information to code with codable children: IMO0002

## 2014-05-25 NOTE — Progress Notes (Signed)
CLINIC:  Cancer Survivorship   REASON FOR VISIT:  Routine follow-up post-treatment for a recent history of breast cancer.  BRIEF ONCOLOGIC HISTORY:    Breast cancer of upper-outer quadrant of right female breast   12/29/2013 Imaging Screening mammogram and [place]: an irregular shadowing hypoechoic mass right breast 9 o'clock location 4 cm from the nipple measuring 7 x 7 x 7 mm. No adenopathy    12/29/2013 Initial Diagnosis Breast cancer of upper-outer quadrant of right female breast   12/29/2013 Initial Biopsy Grade I-II IDC, ER+ (98%), PR+ (81%), HER2- (ratio 1.24), Ki67 15%    01/11/2014 Procedure Genetic counseling/testing: Revealed 1 VUS in ATM gene, p.D1641H (c.4921G>C).  Otherwise, genetic testing negative.    02/14/2014 Surgery Right lumpectomy with SLNB ([facility]): Grade 2 IDC spanning 0.9 cm with associated grade 2 DCIS.  Negative margins. 5 sentinel lymph nodes negative.  HER2 repeated and neg. (ratio 1.16).   02/14/2014 Pathologic Stage pT1bpN0; Stage IA   02/14/2014 Oncotype testing Recurrence score 21 (13% risk of distant recurrence); no adjuvant chemo offered.    03/22/2014 - 04/19/2014 Radiation Therapy Adjuvant radiation Pablo Ledger); Right breast: Total dose 42.72 Gy over 21 fractions. Right breast boost: Total dose 10 Gy over 5 fractions.     Anti-estrogen oral therapy Anti-estrogen therapy was recommended by Dr. Burr Medico; pt declines anti-estrogen treatment.     05/25/2014 Survivorship Survivorship Care Plan given to patient and reviewed with her in-person.     INTERVAL HISTORY:  Ms. Guerry presents to the [facility] today for our initial meeting to review her survivorship care plan detailing her treatment course for breast cancer, as well as monitoring long-term side effects of that treatment, education regarding health maintenance, screening, and overall wellness and health promotion.     Overall, Ms. Sorenson reports feeling quite well since completing her radiation therapy  approximately one month ago.  Her biggest complaints today are right arm pain and periodic anxiety.  The right arm pain is in her upper arm, intermittent, and she describes it as a "horrible ache that feels like I bumped it and it doesn't get better."  She reports that the pain radiates/"shoots" down her arm to her hand at times, and also radiates to her shoulder as well.  The pain makes it difficult for her to sleep on that side without excruciating pain. She first noted the pain about 3 months ago after her lumpectomy surgery.  The pain is in her right humerus area, between the shoulder and elbow.  She states that sometimes she will take an anti-inflammatory, like Aleve, which offers her some relief.  She tries to avoid these types of medications due to her history of kidney disease. She has had her initial evaluation with PT and will begin therapy soon. She relates that she tried to do several exercises on her own at home prior to her PT evaluation and that sometimes helped the arm pain. She has not had an xray of her arm since the pain began. With regards to anxiety, she does report that she has been experiencing some fear of her cancer returning, as well as anxiety related to the stress of caring for her disabled son, who is mentally ill. She does endorse some periodic anxiety, but she reports that she is "forcing herself" to be more involved and engaged with other survivors here at the cancer center.  She tells me that she has started to take advantage of many of our support groups and programs here, such as  the "Look Good.Marland KitchenMarland KitchenFeel Better" class, the Healthy Eating and other dietary classes, and she is currently enrolled in our "Finding Your New Normal" [facility]) course series designed for cancer survivors after treatment.   REVIEW OF SYSTEMS:  General: Denies fever, chills, unintentional weight loss. Endorses occasional fatigue. Cardiac: Denies palpitations, chest pain. Endorses some bilat LE edema, but is  seen by a cardiologist and takes a "fluid pill" for edema. Respiratory: Denies cough, shortness of breath, and dyspnea on exertion.  GI: Denies abdominal pain, constipation, diarrhea, nausea, or vomiting.  GU: Denies dysuria, hematuria, vaginal bleeding, vaginal discharge. Endorses vaginal dryness.  Musculoskeletal: Right arm pain as above. Neuro: Denies headache or recent falls. Denies peripheral neuropathy. Skin: Denies rash, pruritis, or open wounds.  Breast: Denies any new nodularity, masses, tenderness, nipple changes, or nipple discharge.  Psych: Anxiety per above.   A 14-point review of systems was completed and was negative, except as noted above.   ONCOLOGY TREATMENT TEAM:  1. Surgeon:  Dr. Brantley Stage at [facility] 2. Medical Oncologist: Dr. Burr Medico 3. Radiation Oncologist: Dr. Pablo Ledger    PAST MEDICAL/SURGICAL HISTORY:  Past Medical History  Diagnosis Date  . Depression   . Anorexia   . Migraine   . GERD (gastroesophageal reflux disease)   . Seasonal allergies   . Arrhythmia   . CAD (coronary artery disease)   . Frequent UTI   . Edema   . Vitamin D deficiency   . Restless leg syndrome   . Microscopic hematuria   . Hypothyroid   . Breast cancer     ER+/PR+/Her2-  . Wears glasses   . Anxiety   . Stented coronary artery   . Radiation 03/22/14-04/19/14    Breast Cancer upper-outer   Past Surgical History  Procedure Laterality Date  . Cholecystectomy open    . Appendectomy    . Abdominal hysterectomy    . Back surgery  1997    herniated disc repair  . Cystoscopy      neg  . Cardiac catheterization  2007    stent  . Radioactive seed guided mastectomy with axillary sentinel lymph node biopsy Right 02/14/2014    Procedure: RADIOACTIVE SEED GUIDED PARTIAL MASTECTOMY WITH AXILLARY SENTINEL LYMPH NODE BIOPSY;  Surgeon: Erroll Luna, MD;  Location: [facility];  Service: General;  Laterality: Right;     ALLERGIES:  Allergies  Allergen  Reactions  . Iohexol      Desc: throat selling resp distress hives.premedicate pt.entered 07/06/04, bsw.      CURRENT MEDICATIONS:  Outpatient Encounter Prescriptions as of 05/25/2014  Medication Sig  . aspirin 325 MG tablet Take 325 mg by mouth daily.   . cephALEXin (KEFLEX) 250 MG capsule Take 250 mg by mouth daily.    Marland Kitchen CINNAMON PO Take 1,000 mg by mouth daily.   . Coenzyme Q10 (CO Q-10) 200 MG CAPS Take 1 capsule by mouth daily.    . fish oil-omega-3 fatty acids 1000 MG capsule Take 1.2 g by mouth 2 (two) times daily.  . furosemide (LASIX) 20 MG tablet Take 1 tablet (20 mg total) by mouth daily.  . Ginkgo Biloba 120 MG CAPS Take 1 capsule by mouth daily.    Marland Kitchen levothyroxine (SYNTHROID, LEVOTHROID) 112 MCG tablet Take 112 mcg by mouth daily.  . Multiple Vitamin (MULTIVITAMIN) capsule Take 1 capsule by mouth daily.  . nitroGLYCERIN (NITROSTAT) 0.4 MG SL tablet Place 1 tablet (0.4 mg total) under the tongue every 5 (five) minutes as needed.  Pain  . pantoprazole (PROTONIX) 40 MG tablet Take 1 tablet (40 mg total) by mouth 2 (two) times daily.  . potassium chloride SA (KLOR-CON M20) 20 MEQ tablet TAKE 2 TABLETS BY MOUTH TWICE A DAY AND 1/2 TABLET AT BEDTIME  . propranolol (INDERAL) 60 MG tablet Take 1 tablet (60 mg total) by mouth daily.  . ranitidine (ZANTAC) 300 MG tablet Take 1 tablet (300 mg total) by mouth 2 (two) times daily.  . simvastatin (ZOCOR) 40 MG tablet TAKE 1 TABLET BY MOUTH AT BEDTIME  . topiramate (TOPAMAX) 100 MG tablet Take 100 mg by mouth every evening.     ONCOLOGIC FAMILY HISTORY:  Family History  Problem Relation Age of Onset  . Coronary artery disease Mother   . Thyroid disease Mother   . Alzheimer's disease Mother   . Diabetes Mother   . Heart disease Mother   . Cancer Brother     melnoma  . Ovarian cancer Paternal Aunt 65  . Bone cancer Paternal Uncle   . Alzheimer's disease Maternal Grandmother   . Heart disease Maternal Grandfather   . Pancreatic  cancer Maternal Grandfather 30  . Ovarian cancer Maternal Aunt     dx in 58s  . Throat cancer Maternal Uncle     smoker  . Lung cancer Maternal Uncle     heavy smoker  . Ovarian cancer Paternal Aunt     dx in her 43s  . Cancer Cousin     "female cancer"    *Genetic counseling/testing performed on 01/11/14: Revealed 1 VUS in ATM gene, p.D1641H (c.4921G>C).  Otherwise, genetic testing negative.   SOCIAL HISTORY:  CLARESSA HUGHLEY is widowed and lives with her adult son in [place], [place].  Her husband passed away with colon cancer and she was his primary caregiver.  She now cares for her son who suffers with mental illness and suicidal ideations.  She denies any current or history of tobacco, alcohol, or illicit drug use.     PHYSICAL EXAMINATION:  Vital Signs:   Filed Vitals:   05/26/14 1358  BP: 115/50  Pulse: 56  Temp: 97.8 F (36.6 C)   General: Well-nourished, well-appearing female in no acute distress.  She is unaccompanied today.   HEENT: Head is atraumatic and normocephalic.  Pupils equal and reactive to light and accomodation. Conjunctivae clear without exudate.  Sclerae anicteric. Oral mucosa is pink, moist, and intact without lesions.  Oropharynx is pink without lesions or erythema.  Lymph: No cervical, supraclavicular, infraclavicular, or axillary lymphadenopathy noted on palpation.  Cardiovascular: Bradycardic, but with regular rhythm and no murmurs, rubs, or gallops. Respiratory: Clear to auscultation bilaterally. Chest expansion symmetric without accessory muscle use on inspiration or expiration.  GI: Abdomen soft and round. No tenderness to palpation. Bowel sounds normoactive in 4 quadrants. No hepatosplenomegaly.   GU: Deferred.  Musculoskeletal: Right arm with slight weakness (4/5) due to pain.  Focal tenderness to palpation in the posterior upper portion of the humerus.  Radiating pain to hands and up to shoulder per patient during palpation of bone.  Mild edema  noted in upper arm near area of focal tenderness as well. Decreased right shoulder ROM.  Neuro: No focal deficits. Steady gait.  Psych: Mood and affect normal and appropriate for situation.  Extremities: No edema, cyanosis, or clubbing.  Skin: Warm and dry. No open lesions noted.   LABORATORY DATA:  None for this visit.  DIAGNOSTIC IMAGING:  X-ray right humerus pending after today's visit.  ASSESSMENT AND PLAN:   1. History of breast cancer:  Ms. Dopson will follow-up with her medical oncologist, Dr. Burr Medico in 10/2014 with history and physical exam per surveillance protocol.  Ms. Bisig has elected not to take any anti-estrogen therapy because she is concerned about the side effects, mostly with regards to her heart.  I reinforced that the chance of heart attack, blood clot, or stroke are similar to that of birth control pills (very low risk), but Ms. Remache would prefer not to take that chance.  She will now be in observation alone for her history of breast cancer.  A comprehensive survivorship care plan and treatment summary was reviewed with the patient today detailing her breast cancer diagnosis, treatment course, potential late/long-term effects of treatment, appropriate follow-up care with recommendations for the future, and patient education resources.  A copy of this summary, along with a letter will be sent to the patient's primary care provider via mail/fax/In Basket message after today's visit.  Ms. Andrew is welcome to return to the Survivorship Clinic in the future, as needed; no follow-up will be scheduled at this time.    2. Right arm pain: I have ordered a plain film x-ray of Ms. Elling's right humerus to evaluate for a potential pathologic fracture.  The bone is tender to palpation and her symptoms of radiating pain and upper arm edema warrant further investigation. The likelihood of this bone pain being a site of metastasis from her breast cancer is very low, given that she had a  very early breast cancer (Stage IA) with no lymph node involvement.  However, considering a differential diagnosis of pathologic fracture in the humerus is possible considering Ms. Fassnacht's history & presentation of osteopenia, chronic renal insufficiency, mild edema of the upper extremity, decreased ROM of the right arm, and pain without trauma. Ms. Macfarlane will have her x-ray on 05/26/14 at [facility] and I will contact her with the results when they are available.I have made our PT team aware of Ms. Durell's pending x-ray evaluation. If the x-ray is negative, she will continue with PT as previously scheduled.  3. Adjustment disorder with anxious mood: This is likely multi-factorial for Ms. Midgley given her recent diagnosis and treatment for breast cancer, as well as being the primary caregiver of her son, who is severely depressed and suicidal at times.  I acknowledged the great work she is doing with regards to becoming involved in our many support programs at the cancer center.  I also commended her on her commitment to physical fitness, as this can continue to help improve her mood as well.  Largely, I think her current symptoms of anxiety are related to an adjustment disorder.  I offered her support through acknowledgment and expressive supportive counseling.  She has many stressors currently and is seeking out resources to help, which is a great sign.  Overall, she is coping well and will likely continue to do well in her continued journey as a cancer survivor.    4. Bone health:  Given Ms. Venneman's age and history of breast cancer.  Per our records, her last DEXA scan was 04/21/14 and showed osteopenia.  Given that she has declined anti-estrogen therapy, she should have her next DEXA scan at the discretion of her PCP, likely in 1-2 years, or earlier as clinically indicated.  In the meantime, she was encouraged to increase her consumption of foods rich in calcium, as well as increase her  weight-bearing activities.  She  was given education on specific activities to promote bone health.  5. Cancer screening:  Due to Ms. Paragas's history and her age, she should receive screening for skin cancers, colon cancer, and gynecologic cancers.  The information and recommendations are listed on the patient's comprehensive care plan/treatment summary and were reviewed in detail with the patient.    6. Health maintenance and wellness promotion: Ms. Curl was encouraged to consume 5-7 servings of fruits and vegetables per day. She was also encouraged to engage in moderate to vigorous exercise for 30 minutes per day most days of the week. She was instructed to limit her alcohol consumption and continue to abstain from tobacco use.    7. Support services/counseling: It is not uncommon for this period of the patient's cancer care trajectory to be one of many emotions and stressors.  Ms. Bayless was encouraged to continue take advantage of our many support services programs, support groups, and/or counseling in coping with her new life as a cancer survivor after completing anti-cancer treatment.  She is currently enrolled in the [facility] series.  She was offered support today through active listening and expressive supportive counseling.  She was given information regarding our available services and encouraged to contact me with any questions or for help enrolling in any of our support group/programs.    A total of 65 minutes of face-to-face time was spent with this patient with greater than 50% of that time in counseling and care-coordination.   Mike Craze, NP Survivorship Program [facility] [phone]   Note: PRIMARY CARE PROVIDER Loura Pardon, [place] [phone]

## 2014-05-25 NOTE — Progress Notes (Signed)
Patient here for one month follow up completion of radiation to right breast.Skin is dry and pink.Applying lotion with aloe and vitamin e.Main discussion today regarding continued right arm shooting pain that affects writing.Patient has been scheduled to start physical therapy.Awaiting surgeon office to schedule follow up.

## 2014-05-25 NOTE — Progress Notes (Signed)
   Department of Radiation Oncology  Phone:  [phone] Fax:        [phone]   Name: [name] [name] MRN: [id number]  DOB: 04/23/1955  Date: 05/25/2014  Follow Up Visit Note  Diagnosis: Breast cancer of upper-outer quadrant of right female breast   Staging form: Breast, AJCC 7th Edition     Clinical stage from 01/04/2014: Stage IA (T1b, N0, M0) - Unsigned     Pathologic stage from 02/14/2014: Stage IA (T1b, N0, cM0) - Unsigned  Summary and Interval since last radiation: 1 month from completing 52.72 Gy to the right breast on 04/19/14.  Interval History: [name] [name] presents today for routine followup.  Her skin has healed up well. She has some reservations about whether she should have taken radiation or not due to concerns of normal tissue damage. She has declined antiestrogen therapy. She is ready to start PT. She is still walking 30 minutes on a treadmill daily.   Physical Exam:  Filed Vitals:   05/25/14 1357  BP: 115/50  Pulse: 56  Temp: 97.8 F (36.6 C)  Weight: 206 lb 3.2 oz (93.532 kg)  Pleasant female. No distress. Skin is well healed with a red miliary type rash over the breast which is faint and not raised.   IMPRESSION: [name] [name] is a 59 y.o. female with Stage I Right Breast Cancer  PLAN:  She is doing well. We discussed the need for follow up every 4-6 months which she has scheduled.  We discussed the need for yearly mammograms which she can schedule with her OBGYN or with medical oncology. We discussed the need for sun protection in the treated area.  She can always call me with questions.  I will follow up with her on an as needed basis. She has follow up with Dr. Burr Medico in October and will contact Dr. Brantley Stage for follow up as well.   This document serves as a record of services personally performed by Thea Silversmith, MD. It was created on her behalf by Darcus Austin, a trained medical scribe. The creation of this record is based on the scribe's personal observations and the  provider's statements to them. This document has been checked and approved by the attending provider.   Thea Silversmith, MD

## 2014-05-26 ENCOUNTER — Encounter: Payer: Self-pay | Admitting: Adult Health

## 2014-05-26 ENCOUNTER — Encounter (HOSPITAL_COMMUNITY): Payer: Self-pay | Admitting: Emergency Medicine

## 2014-05-26 ENCOUNTER — Emergency Department (HOSPITAL_COMMUNITY)
Admission: EM | Admit: 2014-05-26 | Discharge: 2014-05-26 | Disposition: A | Discharge status: Home / Self Care | Payer: Medicare Other | Attending: Emergency Medicine | Admitting: Emergency Medicine

## 2014-05-26 ENCOUNTER — Ambulatory Visit (HOSPITAL_COMMUNITY)
Admission: RE | Admit: 2014-05-26 | Discharge: 2014-05-26 | Disposition: A | Discharge status: Home / Self Care | Payer: Medicare Other | Source: Ambulatory Visit | Attending: Adult Health | Admitting: Adult Health

## 2014-05-26 ENCOUNTER — Emergency Department (HOSPITAL_COMMUNITY): Payer: Medicare Other

## 2014-05-26 ENCOUNTER — Telehealth: Payer: Self-pay | Admitting: Adult Health

## 2014-05-26 DIAGNOSIS — S060X0A Concussion without loss of consciousness, initial encounter: Secondary | ICD-10-CM | POA: Diagnosis not present

## 2014-05-26 DIAGNOSIS — E039 Hypothyroidism, unspecified: Secondary | ICD-10-CM | POA: Diagnosis not present

## 2014-05-26 DIAGNOSIS — M79601 Pain in right arm: Secondary | ICD-10-CM | POA: Diagnosis not present

## 2014-05-26 DIAGNOSIS — W228XXA Striking against or struck by other objects, initial encounter: Secondary | ICD-10-CM | POA: Insufficient documentation

## 2014-05-26 DIAGNOSIS — Z79899 Other long term (current) drug therapy: Secondary | ICD-10-CM | POA: Insufficient documentation

## 2014-05-26 DIAGNOSIS — S0990XA Unspecified injury of head, initial encounter: Secondary | ICD-10-CM | POA: Diagnosis present

## 2014-05-26 DIAGNOSIS — Z973 Presence of spectacles and contact lenses: Secondary | ICD-10-CM | POA: Insufficient documentation

## 2014-05-26 DIAGNOSIS — Y999 Unspecified external cause status: Secondary | ICD-10-CM | POA: Insufficient documentation

## 2014-05-26 DIAGNOSIS — M7989 Other specified soft tissue disorders: Secondary | ICD-10-CM | POA: Diagnosis not present

## 2014-05-26 DIAGNOSIS — Z792 Long term (current) use of antibiotics: Secondary | ICD-10-CM | POA: Insufficient documentation

## 2014-05-26 DIAGNOSIS — G43909 Migraine, unspecified, not intractable, without status migrainosus: Secondary | ICD-10-CM | POA: Diagnosis not present

## 2014-05-26 DIAGNOSIS — S060XAA Concussion with loss of consciousness status unknown, initial encounter: Secondary | ICD-10-CM

## 2014-05-26 DIAGNOSIS — Z87440 Personal history of urinary (tract) infections: Secondary | ICD-10-CM | POA: Insufficient documentation

## 2014-05-26 DIAGNOSIS — I2510 Atherosclerotic heart disease of native coronary artery without angina pectoris: Secondary | ICD-10-CM | POA: Diagnosis not present

## 2014-05-26 DIAGNOSIS — Z8659 Personal history of other mental and behavioral disorders: Secondary | ICD-10-CM | POA: Insufficient documentation

## 2014-05-26 DIAGNOSIS — Y929 Unspecified place or not applicable: Secondary | ICD-10-CM | POA: Insufficient documentation

## 2014-05-26 DIAGNOSIS — Z7982 Long term (current) use of aspirin: Secondary | ICD-10-CM | POA: Insufficient documentation

## 2014-05-26 DIAGNOSIS — R4781 Slurred speech: Secondary | ICD-10-CM | POA: Insufficient documentation

## 2014-05-26 DIAGNOSIS — Y939 Activity, unspecified: Secondary | ICD-10-CM | POA: Insufficient documentation

## 2014-05-26 DIAGNOSIS — S060X9A Concussion with loss of consciousness of unspecified duration, initial encounter: Secondary | ICD-10-CM

## 2014-05-26 DIAGNOSIS — K219 Gastro-esophageal reflux disease without esophagitis: Secondary | ICD-10-CM | POA: Diagnosis not present

## 2014-05-26 DIAGNOSIS — Z853 Personal history of malignant neoplasm of breast: Secondary | ICD-10-CM | POA: Diagnosis not present

## 2014-05-26 DIAGNOSIS — Z955 Presence of coronary angioplasty implant and graft: Secondary | ICD-10-CM | POA: Insufficient documentation

## 2014-05-26 HISTORY — DX: Concussion with loss of consciousness of unspecified duration, initial encounter: S06.0X9A

## 2014-05-26 HISTORY — DX: Concussion with loss of consciousness status unknown, initial encounter: S06.0XAA

## 2014-05-26 NOTE — ED Notes (Signed)
Bed: EZ66 Expected date:  Expected time:  Means of arrival:  Comments: hold

## 2014-05-26 NOTE — Discharge Instructions (Signed)
Concussion Call [facility] or [facility] office if you're not back to normal by next week. Return if concerned for any reason or if your condition worsens A concussion, or closed-head injury, is a brain injury caused by a direct blow to the head or by a quick and sudden movement (jolt) of the head or neck. Concussions are usually not life-threatening. Even so, the effects of a concussion can be serious. If you have had a concussion before, you are more likely to experience concussion-like symptoms after a direct blow to the head.  CAUSES  Direct blow to the head, such as from running into another player during a soccer game, being hit in a fight, or hitting your head on a hard surface.  A jolt of the head or neck that causes the brain to move back and forth inside the skull, such as in a car crash. SIGNS AND SYMPTOMS The signs of a concussion can be hard to notice. Early on, they may be missed by you, family members, and health care providers. You may look fine but act or feel differently. Symptoms are usually temporary, but they may last for days, weeks, or even longer. Some symptoms may appear right away while others may not show up for hours or days. Every head injury is different. Symptoms include:  Mild to moderate headaches that will not go away.  A feeling of pressure inside your head.  Having more trouble than usual:  Learning or remembering things you have heard.  Answering questions.  Paying attention or concentrating.  Organizing daily tasks.  Making decisions and solving problems.  Slowness in thinking, acting or reacting, speaking, or reading.  Getting lost or being easily confused.  Feeling tired all the time or lacking energy (fatigued).  Feeling drowsy.  Sleep disturbances.  Sleeping more than usual.  Sleeping less than usual.  Trouble falling asleep.  Trouble sleeping (insomnia).  Loss of balance or feeling lightheaded or  dizzy.  Nausea or vomiting.  Numbness or tingling.  Increased sensitivity to:  Sounds.  Lights.  Distractions.  Vision problems or eyes that tire easily.  Diminished sense of taste or smell.  Ringing in the ears.  Mood changes such as feeling sad or anxious.  Becoming easily irritated or angry for little or no reason.  Lack of motivation.  Seeing or hearing things other people do not see or hear (hallucinations). DIAGNOSIS Your health care provider can usually diagnose a concussion based on a description of your injury and symptoms. He or she will ask whether you passed out (lost consciousness) and whether you are having trouble remembering events that happened right before and during your injury. Your evaluation might include:  A brain scan to look for signs of injury to the brain. Even if the test shows no injury, you may still have a concussion.  Blood tests to be sure other problems are not present. TREATMENT  Concussions are usually treated in an emergency department, in urgent care, or at a clinic. You may need to stay in the hospital overnight for further treatment.  Tell your health care provider if you are taking any medicines, including prescription medicines, over-the-counter medicines, and natural remedies. Some medicines, such as blood thinners (anticoagulants) and aspirin, may increase the chance of complications. Also tell your health care provider whether you have had alcohol or are taking illegal drugs. This information may affect treatment.  Your health care provider will send you home with important instructions to follow.  How fast you will recover from a concussion depends on many factors. These factors include how severe your concussion is, what part of your brain was injured, your age, and how healthy you were before the concussion.  Most people with mild injuries recover fully. Recovery can take time. In general, recovery is slower in older persons.  Also, persons who have had a concussion in the past or have other medical problems may find that it takes longer to recover from their current injury. HOME CARE INSTRUCTIONS General Instructions  Carefully follow the directions your health care provider gave you.  Only take over-the-counter or prescription medicines for pain, discomfort, or fever as directed by your health care provider.  Take only those medicines that your health care provider has approved.  Do not drink alcohol until your health care provider says you are well enough to do so. Alcohol and certain other drugs may slow your recovery and can put you at risk of further injury.  If it is harder than usual to remember things, write them down.  If you are easily distracted, try to do one thing at a time. For example, do not try to watch TV while fixing dinner.  Talk with family members or close friends when making important decisions.  Keep all follow-up appointments. Repeated evaluation of your symptoms is recommended for your recovery.  Watch your symptoms and tell others to do the same. Complications sometimes occur after a concussion. Older adults with a brain injury may have a higher risk of serious complications, such as a blood clot on the brain.  Tell your teachers, school nurse, school counselor, coach, athletic trainer, or work Freight forwarder about your injury, symptoms, and restrictions. Tell them about what you can or cannot do. They should watch for:  Increased problems with attention or concentration.  Increased difficulty remembering or learning new information.  Increased time needed to complete tasks or assignments.  Increased irritability or decreased ability to cope with stress.  Increased symptoms.  Rest. Rest helps the brain to heal. Make sure you:  Get plenty of sleep at night. Avoid staying up late at night.  Keep the same bedtime hours on weekends and weekdays.  Rest during the day. Take daytime  naps or rest breaks when you feel tired.  Limit activities that require a lot of thought or concentration. These include:  Doing homework or job-related work.  Watching TV.  Working on the computer.  Avoid any situation where there is potential for another head injury (football, hockey, soccer, basketball, martial arts, downhill snow sports and horseback riding). Your condition will get worse every time you experience a concussion. You should avoid these activities until you are evaluated by the appropriate follow-up health care providers. Returning To Your Regular Activities You will need to return to your normal activities slowly, not all at once. You must give your body and brain enough time for recovery.  Do not return to sports or other athletic activities until your health care provider tells you it is safe to do so.  Ask your health care provider when you can drive, ride a bicycle, or operate heavy machinery. Your ability to react may be slower after a brain injury. Never do these activities if you are dizzy.  Ask your health care provider about when you can return to work or school. Preventing Another Concussion It is very important to avoid another brain injury, especially before you have recovered. In rare cases, another injury can lead to permanent  brain damage, brain swelling, or death. The risk of this is greatest during the first 7-10 days after a head injury. Avoid injuries by:  Wearing a seat belt when riding in a car.  Drinking alcohol only in moderation.  Wearing a helmet when biking, skiing, skateboarding, skating, or doing similar activities.  Avoiding activities that could lead to a second concussion, such as contact or recreational sports, until your health care provider says it is okay.  Taking safety measures in your home.  Remove clutter and tripping hazards from floors and stairways.  Use grab bars in bathrooms and handrails by stairs.  Place non-slip  mats on floors and in bathtubs.  Improve lighting in dim areas. SEEK MEDICAL CARE IF:  You have increased problems paying attention or concentrating.  You have increased difficulty remembering or learning new information.  You need more time to complete tasks or assignments than before.  You have increased irritability or decreased ability to cope with stress.  You have more symptoms than before. Seek medical care if you have any of the following symptoms for more than 2 weeks after your injury:  Lasting (chronic) headaches.  Dizziness or balance problems.  Nausea.  Vision problems.  Increased sensitivity to noise or light.  Depression or mood swings.  Anxiety or irritability.  Memory problems.  Difficulty concentrating or paying attention.  Sleep problems.  Feeling tired all the time. SEEK IMMEDIATE MEDICAL CARE IF:  You have severe or worsening headaches. These may be a sign of a blood clot in the brain.  You have weakness (even if only in one hand, leg, or part of the face).  You have numbness.  You have decreased coordination.  You vomit repeatedly.  You have increased sleepiness.  One pupil is larger than the other.  You have convulsions.  You have slurred speech.  You have increased confusion. This may be a sign of a blood clot in the brain.  You have increased restlessness, agitation, or irritability.  You are unable to recognize people or places.  You have neck pain.  It is difficult to wake you up.  You have unusual behavior changes.  You lose consciousness. MAKE SURE YOU:  Understand these instructions.  Will watch your condition.  Will get help right away if you are not doing well or get worse. Document Released: 04/05/2003 Document Revised: 01/18/2013 Document Reviewed: 08/05/2012 [facility] Patient Information 2015 [place], [place]. This information is not intended to replace advice given to you by your health care provider. Make  sure you discuss any questions you have with your health care provider.

## 2014-05-26 NOTE — ED Notes (Signed)
MD at bedside. 

## 2014-05-26 NOTE — ED Provider Notes (Signed)
CSN: [id number]     Arrival date & time 05/26/14  1731 History   First MD Initiated Contact with Patient 05/26/14 1756     Chief Complaint  Patient presents with  . Head Injury  . Stuttered speech      (Consider location/radiation/quality/duration/timing/severity/associated sxs/prior Treatment) HPI History is obtained from Lyndle Herrlich, x-ray technician via telephone. Patient bumped her head on x-ray tube at 4:45 PM today. Immediately after the event she had slurred speech lasting one or 2 minutes and has had stuttering speech since bumping her head. Initially she complained of headache. Presently denies any headache whatsoever. She was obtaining outpatient x-ray of her right humerus when the event occurred. Past Medical History  Diagnosis Date  . Depression   . Anorexia   . Migraine   . GERD (gastroesophageal reflux disease)   . Seasonal allergies   . Arrhythmia   . CAD (coronary artery disease)   . Frequent UTI   . Edema   . Vitamin D deficiency   . Restless leg syndrome   . Microscopic hematuria   . Hypothyroid   . Breast cancer     ER+/PR+/Her2-  . Wears glasses   . Anxiety   . Stented coronary artery   . Radiation 03/22/14-04/19/14    Breast Cancer upper-outer   Past Surgical History  Procedure Laterality Date  . Cholecystectomy open    . Appendectomy    . Abdominal hysterectomy    . Back surgery  1997    herniated disc repair  . Cystoscopy      neg  . Cardiac catheterization  2007    stent  . Radioactive seed guided mastectomy with axillary sentinel lymph node biopsy Right 02/14/2014    Procedure: RADIOACTIVE SEED GUIDED PARTIAL MASTECTOMY WITH AXILLARY SENTINEL LYMPH NODE BIOPSY;  Surgeon: Erroll Luna, MD;  Location: [facility];  Service: General;  Laterality: Right;   Family History  Problem Relation Age of Onset  . Coronary artery disease Mother   . Thyroid disease Mother   . Alzheimer's disease Mother   . Diabetes Mother   . Heart  disease Mother   . Cancer Brother     melnoma  . Ovarian cancer Paternal Aunt 76  . Bone cancer Paternal Uncle   . Alzheimer's disease Maternal Grandmother   . Heart disease Maternal Grandfather   . Pancreatic cancer Maternal Grandfather 40  . Ovarian cancer Maternal Aunt     dx in 34s  . Throat cancer Maternal Uncle     smoker  . Lung cancer Maternal Uncle     heavy smoker  . Ovarian cancer Paternal Aunt     dx in her 60s  . Cancer Cousin     "female cancer"   History  Substance Use Topics  . Smoking status: Never Smoker   . Smokeless tobacco: Never Used  . Alcohol Use: 0.0 oz/week    0 Standard drinks or equivalent per week     Comment: socially   OB History    No data available     Review of Systems  Constitutional: Negative.   HENT: Negative.   Respiratory: Negative.   Cardiovascular: Negative.   Gastrointestinal: Negative.   Musculoskeletal: Negative.   Skin: Negative.   Allergic/Immunologic: Positive for immunocompromised state.       Cancer patient  Neurological: Positive for speech difficulty.  Psychiatric/Behavioral: Negative.   All other systems reviewed and are negative.     Allergies  Iohexol  Home Medications  Prior to Admission medications   Medication Sig Start Date End Date Taking? Authorizing Provider  aspirin 325 MG tablet Take 325 mg by mouth daily.    Yes Historical Provider, MD  cephALEXin (KEFLEX) 250 MG capsule Take 250 mg by mouth daily.     Yes Historical Provider, MD  CINNAMON PO Take 1,000 mg by mouth daily.    Yes Historical Provider, MD  Coenzyme Q10 (CO Q-10) 200 MG CAPS Take 1 capsule by mouth daily.     Yes Historical Provider, MD  fish oil-omega-3 fatty acids 1000 MG capsule Take 1.2 g by mouth 2 (two) times daily.   Yes Historical Provider, MD  furosemide (LASIX) 20 MG tablet Take 1 tablet (20 mg total) by mouth daily. 11/18/13  Yes Abner Greenspan, MD  Ginkgo Biloba 120 MG CAPS Take 1 capsule by mouth daily.     Yes  Historical Provider, MD  levothyroxine (SYNTHROID, LEVOTHROID) 112 MCG tablet Take 112 mcg by mouth daily.   Yes Historical Provider, MD  Multiple Vitamin (MULTIVITAMIN) capsule Take 1 capsule by mouth daily.   Yes Historical Provider, MD  nitroGLYCERIN (NITROSTAT) 0.4 MG SL tablet Place 1 tablet (0.4 mg total) under the tongue every 5 (five) minutes as needed. Pain 03/28/14  Yes Minus Breeding, MD  pantoprazole (PROTONIX) 40 MG tablet Take 1 tablet (40 mg total) by mouth 2 (two) times daily. 11/18/13  Yes Abner Greenspan, MD  potassium chloride SA (KLOR-CON M20) 20 MEQ tablet TAKE 2 TABLETS BY MOUTH TWICE A DAY AND 1/2 TABLET AT BEDTIME 11/18/13  Yes Abner Greenspan, MD  propranolol (INDERAL) 60 MG tablet Take 1 tablet (60 mg total) by mouth daily. 03/07/14  Yes Minus Breeding, MD  ranitidine (ZANTAC) 300 MG tablet Take 1 tablet (300 mg total) by mouth 2 (two) times daily. 11/18/13  Yes Abner Greenspan, MD  simvastatin (ZOCOR) 40 MG tablet TAKE 1 TABLET BY MOUTH AT BEDTIME 10/05/13  Yes Minus Breeding, MD  topiramate (TOPAMAX) 100 MG tablet Take 100 mg by mouth every evening.   Yes Historical Provider, MD   BP 127/72 mmHg  Pulse 59  Temp(Src) 97.7 F (36.5 C) (Oral)  SpO2 100% Physical Exam  Constitutional: She appears well-developed and well-nourished.  HENT:  Head: Normocephalic and atraumatic.  Eyes: Conjunctivae are normal. Pupils are equal, round, and reactive to light.  Neck: Neck supple. No tracheal deviation present. No thyromegaly present.  Cardiovascular: Normal rate and regular rhythm.   No murmur heard. Pulmonary/Chest: Effort normal and breath sounds normal.  Abdominal: Soft. Bowel sounds are normal. She exhibits no distension. There is no tenderness.  Musculoskeletal: Normal range of motion. She exhibits no edema or tenderness.  Gait normal Romberg normal pronator drift normal DTR symmetric bilaterally knee jerk ankle jerk and biceps finger to nose normal . Stutters occasionally when  she speaks. Speech is not slurred.  Neurological: She is alert. Coordination normal.  Skin: Skin is warm and dry. No rash noted.  Psychiatric: She has a normal mood and affect.  Nursing note and vitals reviewed.   ED Course  Procedures (including critical care time) Labs Review Labs Reviewed - No data to display  Imaging Review Dg Humerus Right  05/26/2014   CLINICAL DATA:  Hx of RADIOACTIVE SEED GUIDED PARTIAL MASTECTOMY WITH AXILLARY SENTINEL LYMPH NODE BIOPSY. Hx of anxiety and depression. Pt states right humerus at mid shaft started hurting and swelling following surgery and has not stopped. Her MD originally thought it  could be from lymphedema, but not they are not sure and want to see x-rays.  EXAM: RIGHT HUMERUS - 2+ VIEW  COMPARISON:  None.  FINDINGS: No fracture. Any humeral head there is a 1.3 cm sclerotic lesion which could represent bone infarct or enchondroma. No abnormal soft tissue opacities.  IMPRESSION: Enchondroma versus bone infarct humeral head.   Electronically Signed   By: Skipper Cliche M.D.   On: 05/26/2014 17:00     EKG Interpretation None     9 PM patient alert Glasgow Coma Score 15 speech markedly improved. Results for orders placed or performed in visit on 60/10/93  FSH-Follicle stimulating hormone  Result Value Ref Range   FSH 44.9 mIU/mL  LH-Luteinizing hormone  Result Value Ref Range   LH 7.2 mIU/mL  CBC with Differential  Result Value Ref Range   WBC 4.9 3.9 - 10.3 10e3/uL   NEUT# 3.1 1.5 - 6.5 10e3/uL   HGB 13.2 11.6 - 15.9 g/dL   HCT 40.0 34.8 - 46.6 %   Platelets 175 145 - 400 10e3/uL   MCV 91.4 79.5 - 101.0 fL   MCH 30.2 25.1 - 34.0 pg   MCHC 33.0 31.5 - 36.0 g/dL   RBC 4.37 3.70 - 5.45 10e6/uL   RDW 13.3 11.2 - 14.5 %   lymph# 1.1 0.9 - 3.3 10e3/uL   MONO# 0.5 0.1 - 0.9 10e3/uL   Eosinophils Absolute 0.2 0.0 - 0.5 10e3/uL   Basophils Absolute 0.0 0.0 - 0.1 10e3/uL   NEUT% 63.5 38.4 - 76.8 %   LYMPH% 23.4 14.0 - 49.7 %   MONO% 9.4  0.0 - 14.0 %   EOS% 3.2 0.0 - 7.0 %   BASO% 0.5 0.0 - 2.0 %  Comprehensive metabolic panel (Cmet) - [facility]  Result Value Ref Range   Sodium 144 136 - 145 mEq/L   Potassium 4.5 3.5 - 5.1 mEq/L   Chloride 111 (H) 98 - 109 mEq/L   CO2 23 22 - 29 mEq/L   Glucose 114 70 - 140 mg/dl   BUN 25.2 7.0 - 26.0 mg/dL   Creatinine 1.3 (H) 0.6 - 1.1 mg/dL   Total Bilirubin 1.21 (H) 0.20 - 1.20 mg/dL   Alkaline Phosphatase 59 40 - 150 U/L   AST 21 5 - 34 U/L   ALT 20 0 - 55 U/L   Total Protein 7.1 6.4 - 8.3 g/dL   Albumin 4.0 3.5 - 5.0 g/dL   Calcium 9.8 8.4 - 10.4 mg/dL   Anion Gap 9 3 - 11 mEq/L   EGFR 46 (L) >90 ml/min/1.73 m2  Estradiol, Ultra Sens  Result Value Ref Range   Estradiol, Ultra Sensitive 3 pg/mL   Ct Head Wo Contrast  05/26/2014   CLINICAL DATA:  Initial encounter for hit right side of head on x-ray tube. Slurred speech.  EXAM: CT HEAD WITHOUT CONTRAST  TECHNIQUE: Contiguous axial images were obtained from the base of the skull through the vertex without intravenous contrast.  COMPARISON:  None.  FINDINGS: Sinuses/Soft tissues: No significant soft tissue swelling. Hypoplastic frontal sinuses. Clear mastoid air cells. No skull fracture.  Intracranial: No mass lesion, hemorrhage, hydrocephalus, acute infarct, intra-axial, or extra-axial fluid collection.  IMPRESSION: Normal head CT.   Electronically Signed   By: Abigail Miyamoto M.D.   On: 05/26/2014 19:55   Dg Humerus Right  05/26/2014   CLINICAL DATA:  Hx of RADIOACTIVE SEED GUIDED PARTIAL MASTECTOMY WITH AXILLARY SENTINEL LYMPH NODE BIOPSY. Hx of anxiety and  depression. Pt states right humerus at mid shaft started hurting and swelling following surgery and has not stopped. Her MD originally thought it could be from lymphedema, but not they are not sure and want to see x-rays.  EXAM: RIGHT HUMERUS - 2+ VIEW  COMPARISON:  None.  FINDINGS: No fracture. Any humeral head there is a 1.3 cm sclerotic lesion which could represent bone infarct or  enchondroma. No abnormal soft tissue opacities.  IMPRESSION: Enchondroma versus bone infarct humeral head.   Electronically Signed   By: Skipper Cliche M.D.   On: 05/26/2014 17:00    MDM  Plan discharged to home. Referral to [facility] and [facility] neurology office as needed if still symptomatic by next week Final diagnoses:  None   diagnosis concussion      Orlie Dakin, MD 05/26/14 2105

## 2014-05-26 NOTE — ED Notes (Addendum)
Pt from radiology-hit head hard on x-ray machine. X-ray tech reports slurred speech immediately after with altered mental status. Moving all extremities equally except for right arm (recent lumpectomy). Pt was here for x-ray of right humerus d/t mid shaft pain. Patient is stuttering at this time with some slurred speech. Face symmetrical. No gross neurologic deficits. Able to answer all questions appropriately. A&Ox4. Reports a sharp, "electric type" pain down the right eye and right cheek "intermittently." Denies numbness or tingling in face or any extremity. Head was hit on left upper side. No other c/c. Not on blood thinners. Recently finished radiology treatment.

## 2014-05-26 NOTE — Telephone Encounter (Signed)
I spoke with Radiology Central Scheduling and then called [name] [name] to ask her to come to [facility] Radiology Dept for her right arm xray that was ordered yesterday in clinic. Per radiology scheduling, she does not need an appointment for an x-ray and can simply show up.    I called [name] [name] and asked that she come to [facility] today and I would call her today or this weekend with the results as soon as the x-ray is read and completed by the radiologist.  [name] expressed appreciation for my call and states that she will come to [facility] today for her imaging.   I encouraged her to call me with any questions or concerns and I would be in touch with results when they are available.    Mike Craze, NP [place] [phone]

## 2014-05-26 NOTE — Progress Notes (Addendum)
Patient presented to [facility] today (05/26/14) for right humerus xray.   CLINICAL DATA: Hx of RADIOACTIVE SEED GUIDED PARTIAL MASTECTOMY WITH AXILLARY SENTINEL LYMPH NODE BIOPSY. Hx of anxiety and depression. Pt states right humerus at mid shaft started hurting and swelling following surgery and has not stopped. Her MD originally thought it could be from lymphedema, but not they are not sure and want to see x-rays.  EXAM: RIGHT HUMERUS - 2+ VIEW  COMPARISON: None.  FINDINGS: No fracture. Any humeral head there is a 1.3 cm sclerotic lesion which could represent bone infarct or enchondroma. No abnormal soft tissue opacities.  IMPRESSION: Enchondroma versus bone infarct humeral head.   Electronically Signed By: Skipper Cliche M.D. On: 05/26/2014 17:00  --------------------------------------------------- **Of note: According to ED notes, the patient hit her head on the x-ray machine and experienced a period of AMS and speech changes.  Therefore, she was sent from the radiology dept to the ED for further evaluation.    **Plan: With regards to the findings of the x-ray, we will likely consult an orthopedic specialist for further evaluation and treatment.  I will contact the patient to make her aware of this plan and place a referral if needed.   Mike Craze, NP [place] [phone]

## 2014-05-29 ENCOUNTER — Telehealth: Payer: Self-pay | Admitting: Adult Health

## 2014-05-29 ENCOUNTER — Other Ambulatory Visit: Payer: Self-pay | Admitting: Adult Health

## 2014-05-29 ENCOUNTER — Other Ambulatory Visit: Payer: Self-pay | Admitting: Family Medicine

## 2014-05-29 DIAGNOSIS — M79621 Pain in right upper arm: Secondary | ICD-10-CM

## 2014-05-29 NOTE — Telephone Encounter (Signed)
I received a voicemail from Ms. Vardaman's son returning my call from this morning, inquiring about the results of her x-ray, which was completed on Friday, 05/26/14.    I returned their call and was able to speak with Ms. Bridwell on the phone.  I asked how she was feeling since she hit her head on the x-ray tube on Friday after her x-ray and was sent to the ED.  She tells me that she is having trouble talking (she was stuttering pretty badly on the phone during our entire conversation).  She denies headaches.  I reinforced for her that her non-contrast CT of the head that was completed during her ED evaluation was completely normal. She will be following up with a neurologist soon; she has made arrangements to do that per her report.  I shared with her the results of her right humerus x-ray.  I read the report to her and let her know that the radiologists suspected an enchondroma or a bony infarct.  I explained to her that enchondromas are usually benign, but that because she is having pain, the lesion warrants additional work-up and evaluation with Orthopedic Surgery.  She expressed concern "how do you know this isn't cancer" and I again reiterated that often these lesions are benign, but she should have further evaluation, which could include additional imaging if deemed appropriate by our orthopedic surgery colleagues.  I placed the referral to Orthopedic Surgery today and let Ms. Kleiber know that she would be contacted regarding this referral.    Ms. Plamondon expressed verbal understanding.  She asked that I spell "enchondroma" for her so that she could look it up.  I encouraged her to ask questions and all other questions were answered to her satisfaction.  I encouraged her to call me directly if she has any additional questions or concerns and I would be happy to help her in any way I can.    Mike Craze, NP [place] [phone]  Of note: Drs. Burr Medico Futures trader), Dr. Brantley Stage (Breast Surgeon), and Dr. Pablo Ledger (Radiation Oncologist) are aware of Ms. Franko's x-ray results and agree with the above plan of care.

## 2014-05-29 NOTE — Telephone Encounter (Signed)
I attempted to reach [name] [name] to give her the results of her right humerus x-ray, as well as to check on her since her recent visit to the ED over the weekend.  I expressed concern and asked that she return my call when she was able.    Mike Craze, NP [place] [phone]

## 2014-05-29 NOTE — Progress Notes (Signed)
Referral for Orthopedic Surgery placed today based on the findings of the plain film x-ray of [name] [name]'s right humerus (completed on 05/26/14).  The x-ray showed a 1.3 sclerotic lesion suggestive of enchondroma vs. bony infarct.  Given that she has focal bone tenderness and mild edema on physical exam, the referral has been placed for evaluation and treatment, as clinically indicated.   I will call [name] [name] and make her aware of this referral and of her x-ray results.    Mike Craze, NP [place] [phone]

## 2014-05-30 ENCOUNTER — Telehealth: Payer: Self-pay | Admitting: Adult Health

## 2014-05-30 ENCOUNTER — Ambulatory Visit (INDEPENDENT_AMBULATORY_CARE_PROVIDER_SITE_OTHER): Payer: Medicare Other | Admitting: Neurology

## 2014-05-30 ENCOUNTER — Encounter: Payer: Self-pay | Admitting: Neurology

## 2014-05-30 VITALS — BP 131/68 | HR 63 | Ht 68.0 in | Wt 203.0 lb

## 2014-05-30 DIAGNOSIS — G43719 Chronic migraine without aura, intractable, without status migrainosus: Secondary | ICD-10-CM

## 2014-05-30 MED ORDER — PREDNISONE 5 MG PO TABS: ORAL_TABLET | ORAL | Status: DC

## 2014-05-30 NOTE — Progress Notes (Signed)
Reason for visit: Migraine headache  [name] [name] is an 59 y.o. female  History of present illness:  [name] [name] is a 59 year old right-handed white female with a history of intractable migraine. The patient is getting Botox injections on a regular basis. She usually gets about 2 or 3 headaches a week. The headaches are generally on the left side the head. The patient last got her Botox treatment in February 2016. The patient indicates that she gets her headaches they may last all day long. She may have some nausea, no vomiting. On 05/26/2014. The patient was in the hospital getting an x-ray for her arm. She was stepping up on a stool to get on a table to have the x-ray done, and she bumped her head on the x-ray tube. She hit the head on the right frontal area. The patient apparently had some slurring of speech afterwards, and within 2 minutes she developed a stuttering speech pattern. The patient underwent a CT scan of the head that was normal. The patient has had an increase in frequency of her headaches since that time. She did not lose consciousness with the event. She has continued to have a stuttering speech pattern. The patient indicates that she did not stutter is a child. She reports some numbness on the right face. She denies any numbness or weakness of the extremities. She comes in today for an evaluation.  Past Medical History  Diagnosis Date  . Depression   . Anorexia   . Migraine   . GERD (gastroesophageal reflux disease)   . Seasonal allergies   . Arrhythmia   . CAD (coronary artery disease)   . Frequent UTI   . Edema   . Vitamin D deficiency   . Restless leg syndrome   . Microscopic hematuria   . Hypothyroid   . Breast cancer     ER+/PR+/Her2-  . Wears glasses   . Anxiety   . Stented coronary artery   . Radiation 03/22/14-04/19/14    Breast Cancer upper-outer  . Concussion 05/26/14    Past Surgical History  Procedure Laterality Date  . Cholecystectomy open      . Appendectomy    . Abdominal hysterectomy    . Back surgery  1997    herniated disc repair  . Cystoscopy      neg  . Cardiac catheterization  2007    stent  . Radioactive seed guided mastectomy with axillary sentinel lymph node biopsy Right 02/14/2014    Procedure: RADIOACTIVE SEED GUIDED PARTIAL MASTECTOMY WITH AXILLARY SENTINEL LYMPH NODE BIOPSY;  Surgeon: Erroll Luna, MD;  Location: [facility];  Service: General;  Laterality: Right;    Family History  Problem Relation Age of Onset  . Coronary artery disease Mother   . Thyroid disease Mother   . Alzheimer's disease Mother   . Diabetes Mother   . Heart disease Mother   . Cancer Brother     melnoma  . Ovarian cancer Paternal Aunt 43  . Bone cancer Paternal Uncle   . Alzheimer's disease Maternal Grandmother   . Heart disease Maternal Grandfather   . Pancreatic cancer Maternal Grandfather 69  . Ovarian cancer Maternal Aunt     dx in 90s  . Throat cancer Maternal Uncle     smoker  . Lung cancer Maternal Uncle     heavy smoker  . Ovarian cancer Paternal Aunt     dx in her 67s  . Cancer Cousin     "  female cancer"    Social history:  reports that she has never smoked. She has never used smokeless tobacco. She reports that she does not drink alcohol or use illicit drugs.    Allergies  Allergen Reactions  . Iohexol      Desc: throat selling resp distress hives.premedicate pt.entered 07/06/04, bsw.     Medications:  Prior to Admission medications   Medication Sig Start Date End Date Taking? Authorizing Provider  aspirin 325 MG tablet Take 325 mg by mouth daily.    Yes Historical Provider, MD  cephALEXin (KEFLEX) 250 MG capsule Take 250 mg by mouth daily.     Yes Historical Provider, MD  CINNAMON PO Take 1,000 mg by mouth daily.    Yes Historical Provider, MD  Coenzyme Q10 (CO Q-10) 200 MG CAPS Take 1 capsule by mouth daily.     Yes Historical Provider, MD  fish oil-omega-3 fatty acids 1000 MG capsule  Take 1.2 g by mouth 2 (two) times daily.   Yes Historical Provider, MD  furosemide (LASIX) 20 MG tablet Take 1 tablet (20 mg total) by mouth daily. 11/18/13  Yes Abner Greenspan, MD  Ginkgo Biloba 120 MG CAPS Take 1 capsule by mouth daily.     Yes Historical Provider, MD  levothyroxine (SYNTHROID, LEVOTHROID) 112 MCG tablet Take 112 mcg by mouth daily.   Yes Historical Provider, MD  Multiple Vitamin (MULTIVITAMIN) capsule Take 1 capsule by mouth daily.   Yes Historical Provider, MD  nitroGLYCERIN (NITROSTAT) 0.4 MG SL tablet Place 1 tablet (0.4 mg total) under the tongue every 5 (five) minutes as needed. Pain 03/28/14  Yes Minus Breeding, MD  pantoprazole (PROTONIX) 40 MG tablet Take 1 tablet (40 mg total) by mouth 2 (two) times daily. 11/18/13  Yes Abner Greenspan, MD  potassium chloride SA (KLOR-CON M20) 20 MEQ tablet TAKE 2 TABLETS BY MOUTH TWICE A DAY AND 1/2 TABLET AT BEDTIME 11/18/13  Yes Abner Greenspan, MD  propranolol (INDERAL) 60 MG tablet Take 1 tablet (60 mg total) by mouth daily. 03/07/14  Yes Minus Breeding, MD  ranitidine (ZANTAC) 300 MG tablet Take 1 tablet (300 mg total) by mouth 2 (two) times daily. 11/18/13  Yes Abner Greenspan, MD  simvastatin (ZOCOR) 40 MG tablet TAKE 1 TABLET BY MOUTH AT BEDTIME 10/05/13  Yes Minus Breeding, MD  topiramate (TOPAMAX) 100 MG tablet Take 100 mg by mouth every evening.   Yes Historical Provider, MD    ROS:  Out of a complete 14 system review of symptoms, the patient complains only of the following symptoms, and all other reviewed systems are negative.  Decreased activity, appetite change, fatigue Eye pain Cold intolerance Nausea Dizziness, headache, numbness, speech difficulty, facial drooping Agitation, confusion, depression, anxiety  Blood pressure 131/68, pulse 63, height '5\' 8"'  (1.727 m), weight 203 lb (92.08 kg).  Physical Exam  General: The patient is alert and cooperative at the time of the examination. The patient is markedly obese. Skin: No  significant peripheral edema is noted. There is no evidence of bruising on the head or face.   Neurologic Exam  Mental status: The patient is alert and oriented x 3 at the time of the examination. The patient has apparent normal recent and remote memory, with an apparently normal attention span and concentration ability.   Cranial nerves: Facial symmetry is present. Speech is not dysarthric or aphasic, the patient has a stuttering speech.. Extraocular movements are full. Visual fields are full.  Motor: The  patient has good strength in all 4 extremities.  Sensory examination: Soft touch sensation is symmetric on the face, arms, and legs. Vibration sensation is symmetric on the face, arms, and legs.  Coordination: The patient has good finger-nose-finger and heel-to-shin bilaterally.  Gait and station: The patient has a normal gait. Tandem gait is minimally unsteady. Romberg is negative. No drift is seen.  Reflexes: Deep tendon reflexes are symmetric.   Assessment/Plan:  1. Minor head trauma  2. Migraine headache, intractable  3. Trauma triggered migraine  4. Stuttering speech, nonorganic  The patient appears to have a nonorganic stuttering speech pattern that may represent a stress reaction. The patient had a minor bump to the head, very low energy while getting up on a x-ray table in the hospital. The patient did not sustain loss of consciousness, but her headaches have increased. The patient likely has trauma triggered migraine. The patient was placed on a prednisone Dosepak, 5 mg 6 day pack. She will follow-up in 3 weeks for an evaluation. I see no indication for any further workup. The friend that accompanies the patient is concerned about a concussion, but I do not believe that this is the case as the energy involved with the head impact was quite low. At any rate, the treatment is conservative in nature.  Jill Alexanders MD 05/30/2014 8:24 PM  [facility] [address] [place], [place] [address]  Phone [phone] Fax [phone]

## 2014-05-30 NOTE — Patient Instructions (Signed)

## 2014-05-30 NOTE — Telephone Encounter (Signed)
I spoke with [name] today to follow-up on her symptoms, particularly her concern regarding her speech and stuttering.  She tells me that she is feeling a little better today, but "I don't really know how my speech is doing because I haven't needed to talk very much today."    She sounds improved from our conversation via phone yesterday.  She is scheduled to see the neurologist at [facility] today.  I encouraged her to seek medical attention at the ER if her symptoms got worse; she voiced understanding and agreed.  We will await our neurology colleagues' recommendations.  [name] knows to call me with any further questions or concerns.   I will follow-up with our schedulers and the patient regarding her referral to orthopedic surgery.  I discussed her case with Dr. Burr Medico (the patient's primary medical oncologist) and provided her with an update as well.   Mike Craze, NP [place] [phone]

## 2014-06-01 ENCOUNTER — Telehealth: Payer: Self-pay | Admitting: Adult Health

## 2014-06-01 NOTE — Telephone Encounter (Signed)
I received a call from Ms. Kaine at about 1pm today.  She called me to inquire about the scheduling of her referral I placed to orthopedic surgery for her right arm.  I let Ms. Elison know that I did not have any updates and would follow-up with our scheduler, Cl[employer] to check the status.    I spoke with Clamensia and she let me know that the patient's records had been sent to [facility] for review before appointment scheduling.  I have asked Clamensia to follow-up with the patient via telephone to give her an update. Clamensia agreed.   Mike Craze, NP [place] [phone]

## 2014-06-02 ENCOUNTER — Telehealth: Payer: Self-pay | Admitting: Adult Health

## 2014-06-02 NOTE — Telephone Encounter (Signed)
I spoke with Ms. Giglia to make her aware of her orthopedic appointment.  Below is the information regarding this appt that was scheduled by Romie Jumper and the information was relayed to Ms. Moody by myself.    [facility] [address], [place] [phone]  Thursday, 06/08/14 at 9:15am with Dr. Maxie Better and his PA, Kennyth Lose.   Ms. Birkel gave verbal understanding of this appointment.  I encouraged her to call them if she needs them before that appt.  She also may call me at any time for any questions or concerns.   Mike Craze, NP [place] [phone]

## 2014-06-06 NOTE — Telephone Encounter (Signed)
I received this refill request.  However, the patient, per the previous note, will not be taking 120 mg of Inderal XL but is to be started on Inderal XL 60 mg daily po.  OK to give a 90 day prescription with 3 refills.

## 2014-06-07 ENCOUNTER — Other Ambulatory Visit: Payer: Self-pay | Admitting: *Deleted

## 2014-06-07 MED ORDER — PROPRANOLOL HCL 60 MG PO TABS: 60.0000 mg | ORAL_TABLET | Freq: Every day | ORAL | Status: DC

## 2014-06-07 NOTE — Telephone Encounter (Signed)
Med refilled.

## 2014-06-08 DIAGNOSIS — M7541 Impingement syndrome of right shoulder: Secondary | ICD-10-CM | POA: Diagnosis not present

## 2014-06-16 DIAGNOSIS — Z853 Personal history of malignant neoplasm of breast: Secondary | ICD-10-CM | POA: Diagnosis not present

## 2014-06-19 DIAGNOSIS — F331 Major depressive disorder, recurrent, moderate: Secondary | ICD-10-CM | POA: Diagnosis not present

## 2014-06-23 ENCOUNTER — Encounter: Payer: Self-pay | Admitting: Neurology

## 2014-06-23 ENCOUNTER — Ambulatory Visit (INDEPENDENT_AMBULATORY_CARE_PROVIDER_SITE_OTHER): Payer: Medicare Other | Admitting: Neurology

## 2014-06-23 VITALS — BP 103/61 | HR 50 | Ht 68.0 in | Wt 200.0 lb

## 2014-06-23 DIAGNOSIS — G43719 Chronic migraine without aura, intractable, without status migrainosus: Secondary | ICD-10-CM

## 2014-06-23 MED ORDER — TOPIRAMATE 50 MG PO TABS: ORAL_TABLET | ORAL | Status: DC

## 2014-06-23 NOTE — Patient Instructions (Signed)
We will go up on the Topamax taking 50 mg in the morning and 100 mg in the evening. May consider going on magnesium supplementation, 250 mg a day to start with, look out for diarrhea. Try to go on riboflavin, 400 mg a day. Magnesium and riboflavin have been shown to help migraine on clinical trials.   Migraine Headache A migraine headache is an intense, throbbing pain on one or both sides of your head. A migraine can last for 30 minutes to several hours. CAUSES  The exact cause of a migraine headache is not always known. However, a migraine may be caused when nerves in the brain become irritated and release chemicals that cause inflammation. This causes pain. Certain things may also trigger migraines, such as:  Alcohol.  Smoking.  Stress.  Menstruation.  Aged cheeses.  Foods or drinks that contain nitrates, glutamate, aspartame, or tyramine.  Lack of sleep.  Chocolate.  Caffeine.  Hunger.  Physical exertion.  Fatigue.  Medicines used to treat chest pain (nitroglycerine), birth control pills, estrogen, and some blood pressure medicines. SIGNS AND SYMPTOMS  Pain on one or both sides of your head.  Pulsating or throbbing pain.  Severe pain that prevents daily activities.  Pain that is aggravated by any physical activity.  Nausea, vomiting, or both.  Dizziness.  Pain with exposure to bright lights, loud noises, or activity.  General sensitivity to bright lights, loud noises, or smells. Before you get a migraine, you may get warning signs that a migraine is coming (aura). An aura may include:  Seeing flashing lights.  Seeing bright spots, halos, or zigzag lines.  Having tunnel vision or blurred vision.  Having feelings of numbness or tingling.  Having trouble talking.  Having muscle weakness. DIAGNOSIS  A migraine headache is often diagnosed based on:  Symptoms.  Physical exam.  A CT scan or MRI of your head. These imaging tests cannot diagnose  migraines, but they can help rule out other causes of headaches. TREATMENT Medicines may be given for pain and nausea. Medicines can also be given to help prevent recurrent migraines.  HOME CARE INSTRUCTIONS  Only take over-the-counter or prescription medicines for pain or discomfort as directed by your health care provider. The use of long-term narcotics is not recommended.  Lie down in a dark, quiet room when you have a migraine.  Keep a journal to find out what may trigger your migraine headaches. For example, write down:  What you eat and drink.  How much sleep you get.  Any change to your diet or medicines.  Limit alcohol consumption.  Quit smoking if you smoke.  Get 7-9 hours of sleep, or as recommended by your health care provider.  Limit stress.  Keep lights dim if bright lights bother you and make your migraines worse. SEEK IMMEDIATE MEDICAL CARE IF:   Your migraine becomes severe.  You have a fever.  You have a stiff neck.  You have vision loss.  You have muscular weakness or loss of muscle control.  You start losing your balance or have trouble walking.  You feel faint or pass out.  You have severe symptoms that are different from your first symptoms. MAKE SURE YOU:   Understand these instructions.  Will watch your condition.  Will get help right away if you are not doing well or get worse. Document Released: 01/13/2005 Document Revised: 05/30/2013 Document Reviewed: 09/20/2012 [facility] Patient Information 2015 [place], [place]. This information is not intended to replace advice given  to you by your health care provider. Make sure you discuss any questions you have with your health care provider.  

## 2014-06-23 NOTE — Progress Notes (Signed)
Reason for visit: Migraine headache  [name] [name] is an 59 y.o. female  History of present illness:  [name] [name] is a 59 year old right-handed white female with a history of intractable migraine headache. The patient was seen recently after she had a minor bump to the head on 05/26/2014. The patient's had a flurry of headaches since that time. She developed a nonorganic stuttering speech pattern following this. Fortunately, this has resolved since she was seen last. The patient was given a trial of prednisone Dosepak, but this offered no benefit. The patient indicates that instead of being primarily a left-sided headache, the headaches are now generalized. The patient is having daily headaches that are of moderate severity. Previously, she was having mild headaches daily, and severe headaches 1 or 2 days of the week. The patient was getting Botox injections, the patient is not sure whether or not this was helpful for her. She is no longer getting the injection secondary to insurance issues. The patient has been reduced on the propranolol to 60 mg daily as she was having a lot of fatigue on the medication. She is on 100 mg of Topamax, she seems to be tolerating this well. She is taking hydrocodone at night for pain in the right shoulder. The patient has a lot of visual blurring with the headache, and she indicates that cold exposure, even to air-conditioning seems to worsen her headache.  Past Medical History  Diagnosis Date  . Depression   . Anorexia   . Migraine   . GERD (gastroesophageal reflux disease)   . Seasonal allergies   . Arrhythmia   . CAD (coronary artery disease)   . Frequent UTI   . Edema   . Vitamin D deficiency   . Restless leg syndrome   . Microscopic hematuria   . Hypothyroid   . Breast cancer     ER+/PR+/Her2-  . Wears glasses   . Anxiety   . Stented coronary artery   . Radiation 03/22/14-04/19/14    Breast Cancer upper-outer  . Concussion 05/26/14    Past  Surgical History  Procedure Laterality Date  . Cholecystectomy open    . Appendectomy    . Abdominal hysterectomy    . Back surgery  1997    herniated disc repair  . Cystoscopy      neg  . Cardiac catheterization  2007    stent  . Radioactive seed guided mastectomy with axillary sentinel lymph node biopsy Right 02/14/2014    Procedure: RADIOACTIVE SEED GUIDED PARTIAL MASTECTOMY WITH AXILLARY SENTINEL LYMPH NODE BIOPSY;  Surgeon: Erroll Luna, MD;  Location: [facility];  Service: General;  Laterality: Right;    Family History  Problem Relation Age of Onset  . Coronary artery disease Mother   . Thyroid disease Mother   . Alzheimer's disease Mother   . Diabetes Mother   . Heart disease Mother   . Cancer Brother     melnoma  . Ovarian cancer Paternal Aunt 41  . Bone cancer Paternal Uncle   . Alzheimer's disease Maternal Grandmother   . Heart disease Maternal Grandfather   . Pancreatic cancer Maternal Grandfather 92  . Ovarian cancer Maternal Aunt     dx in 53s  . Throat cancer Maternal Uncle     smoker  . Lung cancer Maternal Uncle     heavy smoker  . Ovarian cancer Paternal Aunt     dx in her 47s  . Cancer Cousin     "  female cancer"    Social history:  reports that she has never smoked. She has never used smokeless tobacco. She reports that she does not drink alcohol or use illicit drugs.    Allergies  Allergen Reactions  . Iohexol      Desc: throat selling resp distress hives.premedicate pt.entered 07/06/04, bsw.     Medications:  Prior to Admission medications   Medication Sig Start Date End Date Taking? Authorizing Provider  aspirin 325 MG tablet Take 325 mg by mouth daily.    Yes Historical Provider, MD  cephALEXin (KEFLEX) 250 MG capsule Take 250 mg by mouth daily.     Yes Historical Provider, MD  CINNAMON PO Take 1,000 mg by mouth daily.    Yes Historical Provider, MD  Coenzyme Q10 (CO Q-10) 200 MG CAPS Take 1 capsule by mouth daily.     Yes  Historical Provider, MD  fish oil-omega-3 fatty acids 1000 MG capsule Take 1.2 g by mouth 2 (two) times daily.   Yes Historical Provider, MD  furosemide (LASIX) 20 MG tablet Take 1 tablet (20 mg total) by mouth daily. 11/18/13  Yes Abner Greenspan, MD  Ginkgo Biloba 120 MG CAPS Take 1 capsule by mouth daily.     Yes Historical Provider, MD  HYDROcodone-acetaminophen (NORCO/VICODIN) 5-325 MG per tablet Take 1 tablet by mouth at bedtime as needed. 06/08/14  Yes Historical Provider, MD  levothyroxine (SYNTHROID, LEVOTHROID) 112 MCG tablet Take 112 mcg by mouth daily.   Yes Historical Provider, MD  Multiple Vitamin (MULTIVITAMIN) capsule Take 1 capsule by mouth daily.   Yes Historical Provider, MD  nitroGLYCERIN (NITROSTAT) 0.4 MG SL tablet Place 1 tablet (0.4 mg total) under the tongue every 5 (five) minutes as needed. Pain 03/28/14  Yes Minus Breeding, MD  pantoprazole (PROTONIX) 40 MG tablet Take 1 tablet (40 mg total) by mouth 2 (two) times daily. 11/18/13  Yes Abner Greenspan, MD  potassium chloride SA (KLOR-CON M20) 20 MEQ tablet TAKE 2 TABLETS BY MOUTH TWICE A DAY AND 1/2 TABLET AT BEDTIME 11/18/13  Yes Abner Greenspan, MD  propranolol ER (INDERAL LA) 60 MG 24 hr capsule Take 1 capsule (60 mg total) by mouth at bedtime. 06/07/14  Yes Minus Breeding, MD  ranitidine (ZANTAC) 300 MG tablet Take 1 tablet (300 mg total) by mouth 2 (two) times daily. 11/18/13  Yes Abner Greenspan, MD  simvastatin (ZOCOR) 40 MG tablet TAKE 1 TABLET BY MOUTH AT BEDTIME 10/05/13  Yes Minus Breeding, MD  topiramate (TOPAMAX) 100 MG tablet Take 100 mg by mouth every evening.   Yes Historical Provider, MD    ROS:  Out of a complete 14 system review of symptoms, the patient complains only of the following symptoms, and all other reviewed systems are negative.  Visual blurring Flushing Headache, numbness  Blood pressure 103/61, pulse 50, height '5\' 8"'  (1.727 m), weight 200 lb (90.719 kg).  Physical Exam  General: The patient is  alert and cooperative at the time of the examination. The patient is moderately obese.  Neuromuscular: Range of movement of the cervical spine is full.  Skin: No significant peripheral edema is noted.   Neurologic Exam  Mental status: The patient is alert and oriented x 3 at the time of the examination. The patient has apparent normal recent and remote memory, with an apparently normal attention span and concentration ability.   Cranial nerves: Facial symmetry is present. Speech is normal, no aphasia or dysarthria is noted. Extraocular movements  are full. Visual fields are full.  Motor: The patient has good strength in all 4 extremities.  Sensory examination: Soft touch sensation is symmetric on the face, arms, and legs.  Coordination: The patient has good finger-nose-finger and heel-to-shin bilaterally.  Gait and station: The patient has a normal gait. Tandem gait is normal. Romberg is negative. No drift is seen.  Reflexes: Deep tendon reflexes are symmetric.   Assessment/Plan:  1. Intractable migraine headache  2. Stuttering speech pattern, resolved  The patient likely has had an increase in her migraines associated with the minor head trauma that occurred on 05/26/2014. The patient will be increased on the Topamax taking 50 mg in the morning and 100 mg in the evening. The propranolol cannot be increased. The patient is to go on riboflavin at 400 mg daily, and on magnesium supplementation. She will follow-up in about 3 months.  Jill Alexanders MD 06/26/2014 4:29 PM  [facility] [address] [place], [place] [address]  Phone [phone] Fax [phone]

## 2014-06-30 ENCOUNTER — Encounter: Payer: Self-pay | Admitting: Adult Health

## 2014-06-30 NOTE — Progress Notes (Signed)
A birthday card was mailed to the patient today on behalf of the Survivorship Program at [facility].   Gretchen Dawson, NP Survivorship Program [facility] [phone]  

## 2014-07-08 DIAGNOSIS — M7541 Impingement syndrome of right shoulder: Secondary | ICD-10-CM | POA: Diagnosis not present

## 2014-07-17 DIAGNOSIS — F331 Major depressive disorder, recurrent, moderate: Secondary | ICD-10-CM | POA: Diagnosis not present

## 2014-07-17 DIAGNOSIS — M7541 Impingement syndrome of right shoulder: Secondary | ICD-10-CM | POA: Diagnosis not present

## 2014-07-20 DIAGNOSIS — H2513 Age-related nuclear cataract, bilateral: Secondary | ICD-10-CM | POA: Diagnosis not present

## 2014-07-21 ENCOUNTER — Encounter: Payer: Self-pay | Admitting: Cardiology

## 2014-07-21 ENCOUNTER — Telehealth: Payer: Self-pay | Admitting: Cardiology

## 2014-07-25 NOTE — Telephone Encounter (Signed)
Close encounter 

## 2014-07-29 DIAGNOSIS — M7541 Impingement syndrome of right shoulder: Secondary | ICD-10-CM | POA: Diagnosis not present

## 2014-08-04 DIAGNOSIS — M79601 Pain in right arm: Secondary | ICD-10-CM | POA: Diagnosis not present

## 2014-08-04 DIAGNOSIS — M7541 Impingement syndrome of right shoulder: Secondary | ICD-10-CM | POA: Diagnosis not present

## 2014-08-14 DIAGNOSIS — F331 Major depressive disorder, recurrent, moderate: Secondary | ICD-10-CM | POA: Diagnosis not present

## 2014-08-15 DIAGNOSIS — M7541 Impingement syndrome of right shoulder: Secondary | ICD-10-CM | POA: Diagnosis not present

## 2014-08-21 ENCOUNTER — Other Ambulatory Visit: Payer: Self-pay

## 2014-08-21 ENCOUNTER — Other Ambulatory Visit: Payer: Self-pay | Admitting: Cardiology

## 2014-08-21 MED ORDER — SIMVASTATIN 40 MG PO TABS: ORAL_TABLET | ORAL | Status: DC

## 2014-08-25 DIAGNOSIS — M7521 Bicipital tendinitis, right shoulder: Secondary | ICD-10-CM | POA: Diagnosis not present

## 2014-08-25 DIAGNOSIS — M79601 Pain in right arm: Secondary | ICD-10-CM | POA: Diagnosis not present

## 2014-08-29 ENCOUNTER — Other Ambulatory Visit: Payer: Self-pay | Admitting: Adult Health

## 2014-08-29 NOTE — Telephone Encounter (Signed)
Last OV note says: The patient will be increased on the Topamax taking 50 mg in the morning and 100 mg in the evening

## 2014-09-07 DIAGNOSIS — M79601 Pain in right arm: Secondary | ICD-10-CM | POA: Diagnosis not present

## 2014-09-14 ENCOUNTER — Ambulatory Visit: Payer: Medicare Other | Admitting: Cardiology

## 2014-09-14 DIAGNOSIS — M7521 Bicipital tendinitis, right shoulder: Secondary | ICD-10-CM | POA: Diagnosis not present

## 2014-09-14 DIAGNOSIS — M7541 Impingement syndrome of right shoulder: Secondary | ICD-10-CM | POA: Diagnosis not present

## 2014-09-25 DIAGNOSIS — F331 Major depressive disorder, recurrent, moderate: Secondary | ICD-10-CM | POA: Diagnosis not present

## 2014-10-20 ENCOUNTER — Ambulatory Visit (INDEPENDENT_AMBULATORY_CARE_PROVIDER_SITE_OTHER): Payer: Medicare Other | Admitting: Cardiology

## 2014-10-20 ENCOUNTER — Encounter: Payer: Self-pay | Admitting: Cardiology

## 2014-10-20 VITALS — BP 112/62 | HR 76 | Ht 68.0 in | Wt 202.0 lb

## 2014-10-20 DIAGNOSIS — I2510 Atherosclerotic heart disease of native coronary artery without angina pectoris: Secondary | ICD-10-CM | POA: Diagnosis not present

## 2014-10-20 DIAGNOSIS — E785 Hyperlipidemia, unspecified: Secondary | ICD-10-CM | POA: Diagnosis not present

## 2014-10-20 LAB — COMPREHENSIVE METABOLIC PANEL
ALK PHOS: 45 U/L (ref 33–130)
ALT: 32 U/L — AB (ref 6–29)
AST: 27 U/L (ref 10–35)
Albumin: 4.4 g/dL (ref 3.6–5.1)
BUN: 21 mg/dL (ref 7–25)
CO2: 23 mmol/L (ref 20–31)
Calcium: 9.6 mg/dL (ref 8.6–10.4)
Chloride: 109 mmol/L (ref 98–110)
Creat: 1.31 mg/dL — ABNORMAL HIGH (ref 0.50–0.99)
GLUCOSE: 108 mg/dL — AB (ref 65–99)
Potassium: 4.5 mmol/L (ref 3.5–5.3)
Sodium: 141 mmol/L (ref 135–146)
Total Bilirubin: 1.6 mg/dL — ABNORMAL HIGH (ref 0.2–1.2)
Total Protein: 6.9 g/dL (ref 6.1–8.1)

## 2014-10-20 LAB — LIPID PANEL
CHOL/HDL RATIO: 3.5 ratio (ref <=5.0)
CHOLESTEROL: 155 mg/dL (ref 125–200)
HDL: 44 mg/dL — ABNORMAL LOW (ref >=46)
LDL CALC: 74 mg/dL (ref <130)
TRIGLYCERIDES: 183 mg/dL — AB (ref <150)
VLDL: 37 mg/dL — ABNORMAL HIGH (ref <30)

## 2014-10-20 NOTE — Progress Notes (Signed)
HPI The patient presents for followup of her known coronary disease. At the last visit she was having some chest pain which was somewhat atypical and some palpitations. We restarted propranolol and this seems to have helped. She's not having any new cardiovascular symptoms and his walking on her treadmill and trying to do this almost every day. She cannot bring on any symptoms with this. She has no new shortness of breath, PND or orthopnea. No palpitations, presyncope or syncope.  Allergies  Allergen Reactions  . Iohexol      Desc: throat selling resp distress hives.premedicate pt.entered 07/06/04, bsw.     Current Outpatient Prescriptions  Medication Sig Dispense Refill  . aspirin 325 MG tablet Take 325 mg by mouth daily.     . cephALEXin (KEFLEX) 250 MG capsule Take 250 mg by mouth daily.      Marland Kitchen CINNAMON PO Take 1,000 mg by mouth daily.     . Coenzyme Q10 (CO Q-10) 200 MG CAPS Take 1 capsule by mouth daily.      . fish oil-omega-3 fatty acids 1000 MG capsule Take 1.2 g by mouth 2 (two) times daily.    . furosemide (LASIX) 20 MG tablet Take 1 tablet (20 mg total) by mouth daily. 90 tablet 3  . Ginkgo Biloba 120 MG CAPS Take 1 capsule by mouth daily.      Marland Kitchen HYDROcodone-acetaminophen (NORCO/VICODIN) 5-325 MG per tablet Take 1 tablet by mouth at bedtime as needed.  0  . levothyroxine (SYNTHROID, LEVOTHROID) 112 MCG tablet Take 112 mcg by mouth daily.    . Magnesium 250 MG TABS Take 250 mg by mouth daily.    . Multiple Vitamin (MULTIVITAMIN) capsule Take 1 capsule by mouth daily.    . nitroGLYCERIN (NITROSTAT) 0.4 MG SL tablet Place 1 tablet (0.4 mg total) under the tongue every 5 (five) minutes as needed. Pain 25 tablet 3  . pantoprazole (PROTONIX) 40 MG tablet Take 1 tablet (40 mg total) by mouth 2 (two) times daily. 60 tablet 11  . potassium chloride SA (KLOR-CON M20) 20 MEQ tablet TAKE 2 TABLETS BY MOUTH TWICE A DAY AND 1/2 TABLET AT BEDTIME 135 tablet 11  . propranolol ER (INDERAL LA)  60 MG 24 hr capsule Take 1 capsule (60 mg total) by mouth at bedtime. 90 capsule 3  . ranitidine (ZANTAC) 300 MG tablet Take 1 tablet (300 mg total) by mouth 2 (two) times daily. 60 tablet 11  . Riboflavin 400 MG TABS Take 400 mg by mouth daily.    . simvastatin (ZOCOR) 40 MG tablet TAKE 1 TABLET BY MOUTH AT BEDTIME 30 tablet 9  . topiramate (TOPAMAX) 50 MG tablet Take 50 mg in the morning and 100 mg in the evening 270 tablet 0   No current facility-administered medications for this visit.    Past Medical History  Diagnosis Date  . Depression   . Anorexia   . Migraine   . GERD (gastroesophageal reflux disease)   . Seasonal allergies   . Arrhythmia   . Frequent UTI   . Vitamin D deficiency   . Restless leg syndrome   . Microscopic hematuria   . Hypothyroid   . Breast cancer     ER+/PR+/Her2-  . Wears glasses   . Anxiety   . Stented coronary artery     LAD 95% 2007.  . Radiation 03/22/14-04/19/14    Breast Cancer upper-outer  . Concussion 05/26/14    Past Surgical History  Procedure Laterality Date  .  Cholecystectomy open    . Appendectomy    . Abdominal hysterectomy    . Back surgery  1997    herniated disc repair  . Cystoscopy      neg  . Cardiac catheterization  2007    stent  . Radioactive seed guided mastectomy with axillary sentinel lymph node biopsy Right 02/14/2014    Procedure: RADIOACTIVE SEED GUIDED PARTIAL MASTECTOMY WITH AXILLARY SENTINEL LYMPH NODE BIOPSY;  Surgeon: Erroll Luna, MD;  Location: [facility];  Service: General;  Laterality: Right;    ROS:  As stated in the HPI and negative for all other systems.  PHYSICAL EXAM BP 112/62 mmHg  Pulse 76  Ht '5\' 8"'  (1.727 m)  Wt 202 lb (91.627 kg)  BMI 30.72 kg/m2 GENERAL:  Well appearing NECK:  No jugular venous distention, waveform within normal limits, carotid upstroke brisk and symmetric, no bruits, no thyromegaly LUNGS:  Clear to auscultation bilaterally BACK:  No CVA  tenderness CHEST:  Unremarkable HEART:  PMI not displaced or sustained,S1 and S2 within normal limits, no S3, no S4, no clicks, no rubs, no murmurs ABD:  Flat, positive bowel sounds normal in frequency in pitch, no bruits, no rebound, no guarding, no midline pulsatile mass, no hepatomegaly, no splenomegaly EXT:  2 plus pulses throughout, no edema, no cyanosis no clubbing  ASSESSMENT AND PLAN   Palpitations - These are well controlled. She'll continue the meds as listed.  CAD - Reduce ASA 81. No further cardiac testing testing is suggested.  DYSLIPIDEMIA -  She will come back for fasting lipid profile and liver enzymes.

## 2014-10-20 NOTE — Patient Instructions (Signed)
Your physician wants you to follow-up in: 1 Year. You will receive a reminder letter in the mail two months in advance. If you don't receive a letter, please call our office to schedule the follow-up appointment.  Your physician has recommended you make the following change in your medication: Decrease Aspirin to 81 mg daily  Your physician recommends that you return for lab work in: Fasting lipids and CMP

## 2014-10-21 ENCOUNTER — Other Ambulatory Visit: Payer: Self-pay | Admitting: Family Medicine

## 2014-10-23 DIAGNOSIS — F331 Major depressive disorder, recurrent, moderate: Secondary | ICD-10-CM | POA: Diagnosis not present

## 2014-10-23 NOTE — Telephone Encounter (Signed)
Please schedule winter f/u and refill until then 

## 2014-10-23 NOTE — Telephone Encounter (Signed)
Electronic refill request, no recent/future appt., please advise  

## 2014-10-23 NOTE — Telephone Encounter (Signed)
Left voicemail requesting pt to call office back 

## 2014-10-24 NOTE — Telephone Encounter (Signed)
appt scheduled and med refilled 

## 2014-10-26 ENCOUNTER — Ambulatory Visit (INDEPENDENT_AMBULATORY_CARE_PROVIDER_SITE_OTHER): Payer: Medicare Other | Admitting: Neurology

## 2014-10-26 ENCOUNTER — Other Ambulatory Visit: Payer: Self-pay | Admitting: Adult Health

## 2014-10-26 ENCOUNTER — Encounter: Payer: Self-pay | Admitting: Neurology

## 2014-10-26 ENCOUNTER — Ambulatory Visit
Admission: RE | Admit: 2014-10-26 | Discharge: 2014-10-26 | Disposition: A | Discharge status: Home / Self Care | Payer: Medicare Other | Source: Ambulatory Visit | Attending: Radiation Oncology | Admitting: Radiation Oncology

## 2014-10-26 VITALS — BP 129/46 | HR 74 | Temp 98.0°F | Wt 209.4 lb

## 2014-10-26 VITALS — BP 110/55 | HR 57 | Ht 68.0 in | Wt 207.0 lb

## 2014-10-26 DIAGNOSIS — G8929 Other chronic pain: Principal | ICD-10-CM

## 2014-10-26 DIAGNOSIS — G43719 Chronic migraine without aura, intractable, without status migrainosus: Secondary | ICD-10-CM | POA: Diagnosis not present

## 2014-10-26 DIAGNOSIS — C50411 Malignant neoplasm of upper-outer quadrant of right female breast: Secondary | ICD-10-CM | POA: Diagnosis not present

## 2014-10-26 DIAGNOSIS — I2510 Atherosclerotic heart disease of native coronary artery without angina pectoris: Secondary | ICD-10-CM

## 2014-10-26 DIAGNOSIS — G4701 Insomnia due to medical condition: Secondary | ICD-10-CM

## 2014-10-26 MED ORDER — TOPIRAMATE 100 MG PO TABS: 100.0000 mg | ORAL_TABLET | Freq: Two times a day (BID) | ORAL | Status: DC

## 2014-10-26 MED ORDER — TRAMADOL HCL 50 MG PO TABS: 50.0000 mg | ORAL_TABLET | Freq: Four times a day (QID) | ORAL | Status: DC | PRN

## 2014-10-26 NOTE — Patient Instructions (Addendum)
  Increase the topamax to 100 mg twice a day.   Migraine Headache A migraine headache is an intense, throbbing pain on one or both sides of your head. A migraine can last for 30 minutes to several hours. CAUSES  The exact cause of a migraine headache is not always known. However, a migraine may be caused when nerves in the brain become irritated and release chemicals that cause inflammation. This causes pain. Certain things may also trigger migraines, such as:  Alcohol.  Smoking.  Stress.  Menstruation.  Aged cheeses.  Foods or drinks that contain nitrates, glutamate, aspartame, or tyramine.  Lack of sleep.  Chocolate.  Caffeine.  Hunger.  Physical exertion.  Fatigue.  Medicines used to treat chest pain (nitroglycerine), birth control pills, estrogen, and some blood pressure medicines. SIGNS AND SYMPTOMS  Pain on one or both sides of your head.  Pulsating or throbbing pain.  Severe pain that prevents daily activities.  Pain that is aggravated by any physical activity.  Nausea, vomiting, or both.  Dizziness.  Pain with exposure to bright lights, loud noises, or activity.  General sensitivity to bright lights, loud noises, or smells. Before you get a migraine, you may get warning signs that a migraine is coming (aura). An aura may include:  Seeing flashing lights.  Seeing bright spots, halos, or zigzag lines.  Having tunnel vision or blurred vision.  Having feelings of numbness or tingling.  Having trouble talking.  Having muscle weakness. DIAGNOSIS  A migraine headache is often diagnosed based on:  Symptoms.  Physical exam.  A CT scan or MRI of your head. These imaging tests cannot diagnose migraines, but they can help rule out other causes of headaches. TREATMENT Medicines may be given for pain and nausea. Medicines can also be given to help prevent recurrent migraines.  HOME CARE INSTRUCTIONS  Only take over-the-counter or prescription  medicines for pain or discomfort as directed by your health care Elden Brucato. The use of long-term narcotics is not recommended.  Lie down in a dark, quiet room when you have a migraine.  Keep a journal to find out what may trigger your migraine headaches. For example, write down:  What you eat and drink.  How much sleep you get.  Any change to your diet or medicines.  Limit alcohol consumption.  Quit smoking if you smoke.  Get 7-9 hours of sleep, or as recommended by your health care Hanaa Payes.  Limit stress.  Keep lights dim if bright lights bother you and make your migraines worse. SEEK IMMEDIATE MEDICAL CARE IF:   Your migraine becomes severe.  You have a fever.  You have a stiff neck.  You have vision loss.  You have muscular weakness or loss of muscle control.  You start losing your balance or have trouble walking.  You feel faint or pass out.  You have severe symptoms that are different from your first symptoms. MAKE SURE YOU:   Understand these instructions.  Will watch your condition.  Will get help right away if you are not doing well or get worse. Document Released: 01/13/2005 Document Revised: 05/30/2013 Document Reviewed: 09/20/2012 [facility] Patient Information 2015 [place], [place]. This information is not intended to replace advice given to you by your health care Lonette Stevison. Make sure you discuss any questions you have with your health care Tocarra Gassen.

## 2014-10-26 NOTE — Progress Notes (Signed)
REASON FOR VISIT:  Follow-up visit regarding right arm pain   HISTORY OF PRESENT ILLNESS:  [name] [name] is a 59 y.o. female with recent history of Stage IA right breast cancer s/p lumpectomy and radiation therapy.  She completed treatment for breast cancer in 03/2014.  Patient declined adjuvant anti-estrogen therapy due to concern for side effects.   On 05/25/14, [name] [name] was seen in the Survivorship Clinic to review her survivorship care plan, follow-up/surveillance guidelines, and for management of potential late/long-term effects of anti-cancer treatments.  At that time, [name] [name] reported right upper arm/humerus pain which was tender to palpation, had minor associated edema, and limited shoulder ROM with no reported history of trauma/injury to the shoulder/arm.  The pain had reportedly been a concern for her since her lumpectomy surgery on 02/14/14.  Therefore, she was sent for a plain film x-ray of the right humerus, which showed enchondroma.  The patient was notified of the results of the x-ray and referral was placed to orthopedics for evaluation and recommendations for treatment.  Dr. Susa Day ([facility]) sent the patient for MRI, which revealed right rotator cuff full-thickness tear, as well as stable enchondroma benign lesion.    BRIEF INTERVAL HISTORY:  [name] [name] reports to clinic today to meet with Dr. Pablo Ledger and myself regarding continued concerns for her right arm pain.  She has been attending breast cancer support groups here at the cancer center and has voiced concerns that the benign bone lesion could be cancer and this has been very distressing to her.  Therefore, Dr. Pablo Ledger and myself requested the patient's records and images from [facility] for further review and had the patient come in to further discuss her continued concerns regarding her right arm pain.    [name] [name] reports that she cannot sleep at night due to the pain.  She is able to  functionally use her right arm during the day, with very little discomfort.  She says that the arm often feels "very tired", but the pain is minimal.  Her worst pain is at night when she tries to lie down and this has affected her ability to sleep well.  She is concerned that the enchondroma is metastatic breast cancer and is very fearful of that as a possibility.  She tells me that both her father and brother were recently diagnosed with cancer and are undergoing treatment right now.  She is tearful in expressing her concerns regarding her own cancer experience, the anxiety associated with her right arm pain, and her perception that this pain could be cancer-related.     PHYSICAL EXAM:  Filed Vitals:   10/26/14 0851  BP: 129/46  Pulse: 74  Temp: 98 F (36.7 C)   Filed Weights   10/26/14 0851  Weight: 209 lb 6.4 oz (94.983 kg)   Right arm: Non-tender to palpation. Full ROM observed.   Left arm: Full ROM observed.     ASSESSMENT & PLAN:   1. Right arm pain:  I reviewed the MRI images with Ms. Ra and reassured her that the presentation of the enchondroma lesion does not appear to be cancer.  Also, we reviewed the very unlikely case that a Stage IA breast cancer, without lymph node involvement, would recur with bone metastasis at this point.  While this likelihood is not 154% certain this is a benign lesion, it is extremely unlikely to be cancer given her presentation and diagnostic imaging workup.  Per Dr. Reather Littler recommendations, he will  see her in follow-up in 3-4 months.  If she is continuing to have symptoms, then he will consider additional MRI imaging at that time.  He does not recommend surgery for the full-thickness rotator cuff tear at this time, as his note expressed concern that it may worsen her pain and impair functional use of the arm (since she currently has full ROM).  I again reiterated that if this lesion were to be cancerous (which I clearly communicated we do not expect at  all at this time), then you would expect the lesion to be larger on repeat imaging in the future.  When future imaging is done and it demonstrates a stable lesion, then that should provide her additional reassurance that the lesion in her arm is indeed not cancerous.  We discussed the risks and benefits of getting a bone biopsy "to know for sure that it is not cancer."  However, I explained to Ms. Treaster that a bone biopsy (particularly to biopsy this lesion), would be very invasive and may cause her more significant chronic pain than she is experiencing now.  Additionally, we would only recommend a biopsy if we had a high suspicion of a malignant lesion and I again reiterated that we do not suspect cancer for this lesion.  I offered reassurance that while we will defer to orthopedics on their recommendations on how best to monitor and manage the enchondroma and full thickness rotator cuff tear, she will continue to be monitored for her history of breast cancer regularly and can continue to let us know if her symptoms worsen or there are any changes.  She expressed verbal understanding and agreed with this plan.  She will see Dr. Burr Medico for her breast cancer surveillance on 11/09/14.  I will forward this note to Drs. Buffalo so they are aware of this visit with the patient today.   2. Sleep disturbance related to pain:  Given that [name] reports that her pain is worse at night, it appears that her pain is impacting her ability to sleep.  She stated, "If I could just get comfortable and get to sleep, then I would likely stay asleep and maybe feel a little better."  We discussed pain medication options to help manage her pain so that she can rest at night.  She does not like to take narcotics because "I am worried I will get addicted to them."  Therefore, we discussed Tramadol as an option to help manage her pain.  I let her know that I would be happy to prescribe that her for today, but that she would need  to see her PCP, Dr. Glori Bickers, for follow-up.  Dr. Glori Bickers will evaluate her and decide if she wants to continue to prescribe pain medications for Ms. Deeds, if clinically indicated at that time.  Therefore, I have prescribed Tramadol 50 mg po Q6Hprn, with 2 refills, to help with the patient's pain until she sees her PCP in mid-November.  I encouraged her to call me if she has any concerns regarding the Tramadol.  I will route today's note to Dr. Glori Bickers for her records of our visit as well.   3. Emotional support/counseling: I encouraged Ms. Robinson to continue to see her counselor.  She states that she has talked to him about her concerns and he has tried to help cope with her anxiety, fears, and uncertainty.  It is not an uncommon complaint for cancer survivors to experience fear of recurrence and difficulty in  coping with uncertainty.  I offered her support today through active listening and expressive supportive counseling.  She will continue to see her counselor as clinically indicated.  I have also discussed this patient's case with Loren Racer, LCSW and she will be reaching out to the patient for additional support as well.  I encouraged Ms. Flood to continue to attend cancer support groups, as a community of survivors with similar concerns may help validate Ms. Mazzocco's fears, living with uncertainty, and strategies to help her cope.    I spent 70 minutes face-to-face with this patient, with greater than 50% of that time spent in counseling and care coordination.  Dr. Pablo Ledger also saw this patient today; please see her documentation for additional details.   Mike Craze, NP [facility] [phone]

## 2014-10-26 NOTE — Progress Notes (Signed)
Reason for visit: headache  [name] [name] is an 59 y.o. female  History of present illness:  [name] [name] is a 59 year old right-handed white female with a history of intractable migraine headaches. The patient is on Inderal, magnesium, riboflavin, and Topamax for the headache. When seen last in May 2016, her headaches were daily if that point. The headaches are now occurring about 3 times a week, and are still mainly left-sided. The patient indicates that the headache is not over the entire left side of the head, but appears to have 4 or 5 specific focal spots where the headache may come on, and the headache is somewhat more intense. There may be some electric shock sensations with the headache. It may last about 2 hours. The patient is not sleeping well. She has some right shoulder discomfort. She returns to the office today for an evaluation.  Past Medical History  Diagnosis Date  . Depression   . Anorexia   . Migraine   . GERD (gastroesophageal reflux disease)   . Seasonal allergies   . Arrhythmia   . Frequent UTI   . Vitamin D deficiency   . Restless leg syndrome   . Microscopic hematuria   . Hypothyroid   . Breast cancer     ER+/PR+/Her2-  . Wears glasses   . Anxiety   . Stented coronary artery     LAD 95% 2007.  . Radiation 03/22/14-04/19/14    Breast Cancer upper-outer  . Concussion 05/26/14  . Radiation 03/2014    right breast    Past Surgical History  Procedure Laterality Date  . Cholecystectomy open    . Appendectomy    . Abdominal hysterectomy    . Back surgery  1997    herniated disc repair  . Cystoscopy      neg  . Cardiac catheterization  2007    stent  . Radioactive seed guided mastectomy with axillary sentinel lymph node biopsy Right 02/14/2014    Procedure: RADIOACTIVE SEED GUIDED PARTIAL MASTECTOMY WITH AXILLARY SENTINEL LYMPH NODE BIOPSY;  Surgeon: [name] Luna, MD;  Location: [facility];  Service: General;  Laterality: Right;     Family History  Problem Relation Age of Onset  . Coronary artery disease Mother   . Thyroid disease Mother   . Alzheimer's disease Mother   . Diabetes Mother   . Heart disease Mother   . Cancer Brother     melnoma  . Ovarian cancer Paternal Aunt 45  . Bone cancer Paternal Uncle   . Alzheimer's disease Maternal Grandmother   . Heart disease Maternal Grandfather   . Pancreatic cancer Maternal Grandfather 90  . Ovarian cancer Maternal Aunt     dx in 22s  . Throat cancer Maternal Uncle     smoker  . Lung cancer Maternal Uncle     heavy smoker  . Ovarian cancer Paternal Aunt     dx in her 72s  . Cancer Cousin     "female cancer"  . Cancer Father     Social history:  reports that she has never smoked. She has never used smokeless tobacco. She reports that she does not drink alcohol or use illicit drugs.    Allergies  Allergen Reactions  . Iohexol      Desc: throat selling resp distress hives.premedicate pt.entered 07/06/04, bsw.     Medications:  Prior to Admission medications   Medication Sig Start Date End Date Taking? Authorizing Provider  aspirin 81 MG  tablet Take 81 mg by mouth daily.   Yes Historical Provider, MD  cephALEXin (KEFLEX) 250 MG capsule Take 250 mg by mouth daily.     Yes Historical Provider, MD  CINNAMON PO Take 1,000 mg by mouth daily.    Yes Historical Provider, MD  Coenzyme Q10 (CO Q-10) 200 MG CAPS Take 1 capsule by mouth daily.     Yes Historical Provider, MD  fish oil-omega-3 fatty acids 1000 MG capsule Take 1.2 g by mouth 2 (two) times daily.   Yes Historical Provider, MD  furosemide (LASIX) 20 MG tablet Take 1 tablet (20 mg total) by mouth daily. 11/18/13  Yes Abner Greenspan, MD  Ginkgo Biloba 120 MG CAPS Take 1 capsule by mouth daily.     Yes Historical Provider, MD  HYDROcodone-acetaminophen (NORCO/VICODIN) 5-325 MG per tablet Take 1 tablet by mouth at bedtime as needed. 06/08/14  Yes Historical Provider, MD  KLOR-CON M20 20 MEQ tablet TAKE 2  TABLETS BY MOUTH TWICE A DAY AND 1/2 TABLET AT BEDTIME 10/24/14  Yes Marne A Tower, MD  levothyroxine (SYNTHROID, LEVOTHROID) 112 MCG tablet Take 112 mcg by mouth daily.   Yes Historical Provider, MD  Magnesium 250 MG TABS Take 250 mg by mouth daily.   Yes Historical Provider, MD  Multiple Vitamin (MULTIVITAMIN) capsule Take 1 capsule by mouth daily.   Yes Historical Provider, MD  nitroGLYCERIN (NITROSTAT) 0.4 MG SL tablet Place 1 tablet (0.4 mg total) under the tongue every 5 (five) minutes as needed. Pain 03/28/14  Yes Minus Breeding, MD  pantoprazole (PROTONIX) 40 MG tablet Take 1 tablet (40 mg total) by mouth 2 (two) times daily. 11/18/13  Yes Abner Greenspan, MD  propranolol ER (INDERAL LA) 60 MG 24 hr capsule Take 1 capsule (60 mg total) by mouth at bedtime. 06/07/14  Yes Minus Breeding, MD  ranitidine (ZANTAC) 300 MG tablet Take 1 tablet (300 mg total) by mouth 2 (two) times daily. 11/18/13  Yes Abner Greenspan, MD  Riboflavin 400 MG TABS Take 400 mg by mouth daily.   Yes Historical Provider, MD  simvastatin (ZOCOR) 40 MG tablet TAKE 1 TABLET BY MOUTH AT BEDTIME 08/21/14  Yes Minus Breeding, MD  traMADol (ULTRAM) 50 MG tablet Take 1 tablet (50 mg total) by mouth every 6 (six) hours as needed for moderate pain. 10/26/14  Yes Holley Bouche, NP  topiramate (TOPAMAX) 100 MG tablet Take 1 tablet (100 mg total) by mouth 2 (two) times daily. 10/26/14   Kathrynn Ducking, MD    ROS:  Out of a complete 14 system review of symptoms, the patient complains only of the following symptoms, and all other reviewed systems are negative.  Headache Right shoulder pain  Blood pressure 110/55, pulse 57, height '5\' 8"'  (1.727 m), weight 207 lb (93.895 kg).  Physical Exam  General: The patient is alert and cooperative at the time of the examination. The patient is moderately to markedly obese.  Skin: No significant peripheral edema is noted.   Neurologic Exam  Mental status: The patient is alert and oriented x  3 at the time of the examination. The patient has apparent normal recent and remote memory, with an apparently normal attention span and concentration ability.   Cranial nerves: Facial symmetry is present. Speech is normal, no aphasia or dysarthria is noted. Extraocular movements are full. Visual fields are full.  Motor: The patient has good strength in all 4 extremities.  Sensory examination: Soft touch sensation is symmetric  on the face, arms, and legs.  Coordination: The patient has good finger-nose-finger and heel-to-shin bilaterally.  Gait and station: The patient has a normal gait. Tandem gait is normal. Romberg is negative. No drift is seen.  Reflexes: Deep tendon reflexes are symmetric.   Assessment/Plan:  1. Intractable migraine headache  The patient is having fewer headaches, but the headaches are more intense. The patient will go up on the Topamax taking 100 mg twice daily, the 100 mg tablets were called in. She will follow-up in 6 months, sooner if needed.  Jill Alexanders MD 10/26/2014 7:02 PM  [facility] [address] [place], [place] [address]  Phone [phone] Fax [phone]

## 2014-10-26 NOTE — Progress Notes (Signed)
Department of Radiation Oncology  Phone:  [phone] Fax:        [phone]   Name: [name] [name] MRN: [id number]  DOB: 08/11/1955  Date: 10/26/2014  Follow Up Visit Note  Diagnosis: Breast cancer of upper-outer quadrant of right female breast   Staging form: Breast, AJCC 7th Edition     Clinical stage from 01/04/2014: Stage IA (T1b, N0, M0) - Unsigned     Pathologic stage from 02/14/2014: Stage IA (T1b, N0, cM0) - Unsigned  Summary and Interval since last radiation: 1 month from completing 52.72 Gy to the right breast on 04/19/14.  Interval History: [name] [name] presents today for routine followup.  Her skin has healed up well. She has some reservations about whether she should have taken radiation or not due to concerns of normal tissue damage. She has declined antiestrogen therapy. She is ready to start PT. She is still walking 30 minutes on a treadmill daily. She was referred to orthopedic surgery for investigation of the endocondroma in the right humeral head. This was felt to be benign on MRI. The MRI also found a torn rotator cuff. Discussion was had having surgical intervention vs. Observation. She was felt to be asymptomic enough to warrant further evaluation and follow-up was recommended. She was confused about her plan for Orthopedic surgery and emailed our NP regarding this. We obatined her records from [facility] and I have reviewed these extensivley. She is very concerned that this area could represent metastatic disease and has questioned why the surgeons will not biopsy this right humeral head lesion. From the surgeons notes and from speaking with them, it appears that this will require an open biopsy. This comes with a lot of side effects and possible increase in her pain. She has had two cortizone injections at this time and does not feel these have been helpful. She has declined anti-estrogen therapy. She is upset as 2 family members have recently been diagnosed with  cancer. Her arm pain is worse at night.   Physical Exam:  Filed Vitals:   10/26/14 0851  BP: 129/46  Pulse: 74  Temp: 98 F (36.7 C)  Weight: 209 lb 6.4 oz (94.983 kg)  Pleasant female. No distress. Skin is well healed with a red miliary type rash over the breast which is faint and not raised. There is no significant changes to the status of the paients overall health to be noted at this time. The patient is alert and oriented.   IMPRESSION: [name] [name] is a 59 y.o. female presenting to clinic in regards to her with Stage I Right Breast Cancer with benign endochondroma along with rotator cuff of the right shoulder. The patent understands the results of her most recent MRI and the importance of managing vocalized symptoms appropriately via recommended healthy management methods in regards to her diagnosis. The patient understands that she can access her appointments and medical records via [facility].   PLAN:  I have gone over her outside consultation notes as well as her most recent MRI scan. I am not familiar with what a rotator cuff looks like on MRI imaging, but I was able to show her and discuss her benign legion. She will continue follow-up with Dr. Burr Medico, MD in October of 2016. I have also scanned her ortho surgery notes and placed them under the media tab of [facility] for other providers review.   We have encouraged her to seek counseling as she is "dealing with a lot right now" and have given  her a one time prescription of tramadol to help her pain at night. We encouraged her to follow up with orthopedic surgery for her arm issues.   I will see the patient back on an as needed basis. We discussed the need for yearly mammograms which she can schedule with her OBGYN or with medical oncology. All vocalized questions and concerns have been addressed. If the patient develops any further questions or concerns in regards to her treatment and recovery, she has been encouraged to contact Dr. Pablo Ledger, MD. I  will follow up with her on an as needed basis. She has follow up with Dr. Burr Medico in October and will contact Dr. Brantley Stage for follow up as well.    This document serves as a record of services personally performed by Thea Silversmith , MD. It was created on her behalf by Lenn Cal, a trained medical scribe. The creation of this record is based on the scribe's personal observations and the provider's statements to them. This document has been checked and approved by the attending provider.   ------------------------------------------------  Thea Silversmith, MD

## 2014-11-08 ENCOUNTER — Other Ambulatory Visit: Payer: Self-pay | Admitting: Family Medicine

## 2014-11-09 ENCOUNTER — Telehealth: Payer: Self-pay | Admitting: Hematology

## 2014-11-09 ENCOUNTER — Encounter: Payer: Self-pay | Admitting: Hematology

## 2014-11-09 ENCOUNTER — Ambulatory Visit (HOSPITAL_BASED_OUTPATIENT_CLINIC_OR_DEPARTMENT_OTHER): Payer: Medicare Other | Admitting: Hematology

## 2014-11-09 ENCOUNTER — Other Ambulatory Visit (HOSPITAL_BASED_OUTPATIENT_CLINIC_OR_DEPARTMENT_OTHER): Payer: Medicare Other

## 2014-11-09 VITALS — BP 116/57 | HR 70 | Temp 96.9°F | Resp 18 | Ht 68.0 in | Wt 206.9 lb

## 2014-11-09 DIAGNOSIS — M8580 Other specified disorders of bone density and structure, unspecified site: Secondary | ICD-10-CM

## 2014-11-09 DIAGNOSIS — Z170 Estrogen receptor positive status [ER+]: Secondary | ICD-10-CM

## 2014-11-09 DIAGNOSIS — C50411 Malignant neoplasm of upper-outer quadrant of right female breast: Secondary | ICD-10-CM

## 2014-11-09 DIAGNOSIS — Z8041 Family history of malignant neoplasm of ovary: Secondary | ICD-10-CM | POA: Diagnosis not present

## 2014-11-09 LAB — CBC WITH DIFFERENTIAL/PLATELET
BASO%: 0.9 % (ref 0.0–2.0)
Basophils Absolute: 0 10*3/uL (ref 0.0–0.1)
EOS ABS: 0.1 10*3/uL (ref 0.0–0.5)
EOS%: 2.1 % (ref 0.0–7.0)
HCT: 38.1 % (ref 34.8–46.6)
HEMOGLOBIN: 13 g/dL (ref 11.6–15.9)
LYMPH#: 1.5 10*3/uL (ref 0.9–3.3)
LYMPH%: 27.7 % (ref 14.0–49.7)
MCH: 31.1 pg (ref 25.1–34.0)
MCHC: 34 g/dL (ref 31.5–36.0)
MCV: 91.5 fL (ref 79.5–101.0)
MONO#: 0.6 10*3/uL (ref 0.1–0.9)
MONO%: 11 % (ref 0.0–14.0)
NEUT%: 58.3 % (ref 38.4–76.8)
NEUTROS ABS: 3.1 10*3/uL (ref 1.5–6.5)
Platelets: 163 10*3/uL (ref 145–400)
RBC: 4.17 10*6/uL (ref 3.70–5.45)
RDW: 13.4 % (ref 11.2–14.5)
WBC: 5.3 10*3/uL (ref 3.9–10.3)

## 2014-11-09 LAB — COMPREHENSIVE METABOLIC PANEL (CC13)
ALBUMIN: 4.1 g/dL (ref 3.5–5.0)
ALK PHOS: 49 U/L (ref 40–150)
ALT: 32 U/L (ref 0–55)
AST: 27 U/L (ref 5–34)
Anion Gap: 7 mEq/L (ref 3–11)
BILIRUBIN TOTAL: 1.21 mg/dL — AB (ref 0.20–1.20)
BUN: 18.3 mg/dL (ref 7.0–26.0)
CO2: 22 mEq/L (ref 22–29)
CREATININE: 1.2 mg/dL — AB (ref 0.6–1.1)
Calcium: 9.8 mg/dL (ref 8.4–10.4)
Chloride: 113 mEq/L — ABNORMAL HIGH (ref 98–109)
EGFR: 49 mL/min/{1.73_m2} — ABNORMAL LOW (ref >90)
GLUCOSE: 87 mg/dL (ref 70–140)
Potassium: 4.8 mEq/L (ref 3.5–5.1)
SODIUM: 141 meq/L (ref 136–145)
TOTAL PROTEIN: 7 g/dL (ref 6.4–8.3)

## 2014-11-09 NOTE — Telephone Encounter (Signed)
Gave and printed appt sched and avs for pt for NOV and April 2017

## 2014-11-09 NOTE — Progress Notes (Signed)
[place]  [phone] Fax:([phone]) [place] Note   Patient Care Team: Abner Greenspan, MD as PCP - General Minus Breeding, MD as Consulting Physician (Cardiology) Holley Bouche, NP as Nurse Practitioner (Nurse Practitioner) Erroll Luna, MD as Consulting Physician (General Surgery) Thea Silversmith, MD as Consulting Physician (Radiation Oncology) Truitt Merle, MD as Consulting Physician (Hematology) 11/09/2014  CHIEF COMPLAINTS Follow up stage IA right breast cancer, pT1bN0    Breast cancer of upper-outer quadrant of right female breast ([place])   12/29/2013 Imaging Screening mammogram and [place]: an irregular shadowing hypoechoic mass right breast 9 o'clock location 4 cm from the nipple measuring 7 x 7 x 7 mm. No adenopathy    12/29/2013 Initial Diagnosis Breast cancer of upper-outer quadrant of right female breast   12/29/2013 Initial Biopsy Grade I-II IDC, ER+ (98%), PR+ (81%), HER2- (ratio 1.24), Ki67 15%    01/11/2014 Procedure Genetic counseling/testing: Revealed 1 VUS in ATM gene, p.D1641H (c.4921G>C).  Otherwise, genetic testing negative.    02/14/2014 Surgery Right lumpectomy with SLNB ([facility]): Grade 2 IDC spanning 0.9 cm with associated grade 2 DCIS.  Negative margins. 5 sentinel lymph nodes negative.  HER2 repeated and neg. (ratio 1.16).   02/14/2014 Pathologic Stage pT1bpN0; Stage IA   02/14/2014 Oncotype testing Recurrence score 21 (13% risk of distant recurrence); no adjuvant chemo offered.    03/22/2014 - 04/19/2014 Radiation Therapy Adjuvant radiation Pablo Ledger); Right breast: Total dose 42.72 Gy over 21 fractions. Right breast boost: Total dose 10 Gy over 5 fractions.     Anti-estrogen oral therapy Anti-estrogen therapy was recommended by Dr. Burr Medico; pt declines anti-estrogen treatment.     05/25/2014 Survivorship Survivorship Care Plan given to patient and reviewed with her in-person.      HISTORY OF PRESENTING ILLNESS:  [name] [name] 59 y.o. female presents to our breast multidisciplinary clinic today because of newly diagnosed breast cancer.   This was discovered by routine screening mammogram a few weeks ago. She underwent diagnostic mammogram and ultrasound on 12/29/2013 which showed an irregular shadowing hypoechoic mass in right breast likely position measuring 7 mm. No adenopathy. She underwent core needle biopsy on the same day, which showed grade 1-2 invasive ductal adenocarcinoma, ER positive PR positive and HER-2 negative, Ki-67 10%.  She has been feeling very anxious since the biopsy. She does have history of coronary artery disease, status post stent placement in [facility] 7. Since the breast cancer diagnosis, she has had several episodes of anxiety attack, hypoventilation, with chest tightness. She appeared quite nervous when I met her first in the clinic. Before the biopsy, she felt well in general, she does have intermittent migraine headaches, chronic leg swollen, intermittent chest pain, denies any other new symptoms, no weight loss or other changes.  She lives with her only son, who is 50 years old, on disability due to depression and suicidal ideas. She is his caregiver.  INTERIM HISTORY: She returns for follow up. She is doing well overall. She has had some worsening of her migraine headaches, and was seen by her neurologist lately. She otherwise is doing well, denies any new pain, or other symptoms. She has good appetite and eating well.   MEDICAL HISTORY:  Past Medical History  Diagnosis Date  . Depression   . Anorexia   . Migraine   . GERD (gastroesophageal reflux disease)   . Seasonal allergies   . Arrhythmia   . CAD (coronary artery disease), s/p stent placement  2007   . Frequent UTI   . Edema   . Vitamin D deficiency   . Restless leg syndrome   . Microscopic hematuria   . Hypothyroid   She has psychologist Dr. Rica Mote   SURGICAL HISTORY: Past Surgical History  Procedure Laterality  Date  . Cholecystectomy open    . Appendectomy    . Abdominal hysterectomy  1983  . Back surgery  1997    herniated disc repair  . Cystoscopy      neg  Back surgery in 1997   SOCIAL HISTORY: History   Social History  . Marital Status: Widowed, husband died of colon cancer in 97     Spouse Name: N/A    Number of Children: One son  31 yo, on disability for depression, who lives with her   . Years of Education: N/A   Occupational History  . Secretary, Clinical cytogeneticist, retired     Social History Main Topics  . Smoking status: Former Smoker    Start date: 10/14/1971  . Smokeless tobacco: Never Used  . Alcohol Use: Yes     Comment: socially  . Drug Use: No  . Sexual Activity: Not on file   Other Topics Concern  . Not on file   Social History Narrative   Does not get any regular exercise      Doctors   -uro-[place]   -cardio--hochrein   -Neurology--Dr. Jannifer Franklin   -allergies--Dr. Carmelina Peal   Gyn past--Dr. Warnell Forester   Couselor--Dr. Mitchum   Endo-- Dr. Chalmers Cater      Lives with son who is 34 years old    FAMILY HISTORY: Family History  Problem Relation Age of Onset  . Coronary artery disease Mother   . Thyroid disease Mother   . Alzheimer's disease Mother   . Diabetes Mother   . Heart disease Mother   . Cancer Brother     melnoma  . Ovarian cancer Paternal Aunt 65  . Bone cancer Paternal Uncle   . Alzheimer's disease Maternal Grandmother   . Heart disease Maternal Grandfather   . Pancreatic cancer Maternal Grandfather 26  . Ovarian cancer Maternal Aunt     dx in 17s  . Throat cancer Maternal Uncle     smoker  . Lung cancer Maternal Uncle     heavy smoker  . Ovarian cancer Paternal Aunt     dx in her 55s  . Cancer Cousin     "female cancer"  . Cancer Father    GYN HISTORY  Menarchal: 10 LMP: 1983 hysrectomy  Contraceptive: 2 years  HRT: was on for 10-20 years, off several years  GxP1: several miscarriages     ALLERGIES:  is allergic to  iohexol.  MEDICATIONS:  Current Outpatient Prescriptions  Medication Sig Dispense Refill  . aspirin 325 MG tablet Take 325 mg by mouth daily.     . cephALEXin (KEFLEX) 250 MG capsule Take 250 mg by mouth daily.      Marland Kitchen CINNAMON PO Take 1,000 mg by mouth daily.     . Coenzyme Q10 (CO Q-10) 200 MG CAPS Take 1 capsule by mouth daily.      . divalproex (DEPAKOTE) 250 MG DR tablet Take 1 tablet by mouth daily for 1 week then increase to 1 tablet by mouth twice a day. 60 tablet 3  . fish oil-omega-3 fatty acids 1000 MG capsule Take 1.2 g by mouth 2 (two) times daily.    . furosemide (LASIX) 20 MG tablet Take  1 tablet (20 mg total) by mouth daily. 90 tablet 3  . Ginkgo Biloba 120 MG CAPS Take 1 capsule by mouth daily.      Marland Kitchen levothyroxine (SYNTHROID, LEVOTHROID) 112 MCG tablet Take 112 mcg by mouth daily.    . Multiple Vitamin (MULTIVITAMIN) capsule Take 1 capsule by mouth daily.    . nitroGLYCERIN (NITROSTAT) 0.4 MG SL tablet Place 0.4 mg under the tongue every 5 (five) minutes as needed. Pain    . pantoprazole (PROTONIX) 40 MG tablet Take 1 tablet (40 mg total) by mouth 2 (two) times daily. 60 tablet 11  . potassium chloride SA (KLOR-CON M20) 20 MEQ tablet TAKE 2 TABLETS BY MOUTH TWICE A DAY AND 1/2 TABLET AT BEDTIME 135 tablet 11  . propranolol ER (INDERAL LA) 120 MG 24 hr capsule TAKE ONE CAPSULE BY MOUTH EVERY DAY AT BEDTIME 30 capsule 5  . ranitidine (ZANTAC) 300 MG tablet Take 1 tablet (300 mg total) by mouth 2 (two) times daily. 60 tablet 11  . simvastatin (ZOCOR) 40 MG tablet TAKE 1 TABLET BY MOUTH AT BEDTIME 30 tablet 9  .      . topiramate (TOPAMAX) 50 MG tablet Take 1 tablet  in the AM & 2 tablets in the PM for 1 week. Then to 1 tablet twice a day for 1 week, then 1 tablet daily for 1 week then stop. 50 tablet 0  . trimethoprim (TRIMPEX) 100 MG tablet Take 100 mg by mouth at bedtime.     No current facility-administered medications for this visit.    REVIEW OF SYSTEMS:    Constitutional: Denies fevers, chills or abnormal night sweats Eyes: Denies blurriness of vision, double vision or watery eyes Ears, nose, mouth, throat, and face: Denies mucositis or sore throat Respiratory: Denies cough, dyspnea or wheezes Cardiovascular: (+) palpitation and intermittent chest pain, (+) lower extremity swelling Gastrointestinal:  Denies nausea, heartburn or change in bowel habits Skin: Denies abnormal skin rashes Lymphatics: Denies new lymphadenopathy or easy bruising Neurological:Denies numbness, tingling or new weaknesses Behavioral/Psych: (+) very anxious All other systems were reviewed with the patient and are negative.  PHYSICAL EXAMINATION: ECOG PERFORMANCE STATUS: 0 - Asymptomatic  Filed Vitals:   11/09/14 1428  BP: 116/57  Pulse: 70  Temp: 96.9 F (36.1 C)  Resp: 18   Filed Weights   11/09/14 1428  Weight: 206 lb 14.4 oz (93.849 kg)    GENERAL:alert, no distress and comfortable SKIN: skin color, texture, turgor are normal, no rashes or significant lesions EYES: normal, conjunctiva are pink and non-injected, sclera clear OROPHARYNX:no exudate, no erythema and lips, buccal mucosa, and tongue normal  NECK: supple, thyroid normal size, non-tender, without nodularity LYMPH:  no palpable lymphadenopathy in the cervical, axillary or inguinal LUNGS: clear to auscultation and percussion with normal breathing effort HEART: regular rate & rhythm and no murmurs and no lower extremity edema ABDOMEN:abdomen soft, non-tender and normal bowel sounds Musculoskeletal:no cyanosis of digits and no clubbing  PSYCH: alert & oriented x 3 with fluent speech NEURO: no focal motor/sensory deficits Breasts: Breast inspection showed them to be symmetrical with no nipple discharge. Palpation of the breasts and axilla revealed no obvious mass that I could appreciate. Surgical scar in the right breast is well-healed with some scar tissue underneath. (+) Mild skin pigmentation,  improved.   LABORATORY DATA:  I have reviewed the data as listed Lab Results  Component Value Date   WBC 5.3 11/09/2014   HGB 13.0 11/09/2014   HCT  38.1 11/09/2014   MCV 91.5 11/09/2014   PLT 163 11/09/2014    Recent Labs  11/16/13 1514  11/18/13 1438  02/13/14 1100 04/03/14 1317 10/20/14 1511 11/09/14 1411  NA  --   < > 142  < > 142 144 141 141  K  --   < > 4.2  < > 4.5 4.5 4.5 4.8  CL  --   --  110  --  107  --  109  --   CO2  --   < > 22  < > _0 GLUCOSE  --   < > 116*  < > 120* 114 108* 87  BUN  --   < > 23  < > 14 25.2 21 18.3  CREATININE  --   --  1.35*  < > 1.24* 1.3* 1.31* 1.2*  CALCIUM  --   < > 9.4  < > 9.9 9.8 9.6 9.8  GFRNONAA  --   --   --   --  47*  --   --   --   GFRAA  --   --   --   --  54*  --   --   --   PROT 7.3  < > 6.6  < > 7.4 7.1 6.9 7.0  ALBUMIN 4.6  < > 4.1  < > 4.2 4.0 4.4 4.1  AST 26  --  20  < > _1 ALT 28  --  22  < > 26 20 32* 32  ALKPHOS 54  --  47  < > 54 59 45 49  BILITOT 1.3*  --  1.4*  < > 1.3* 1.21* 1.6* 1.21*  BILIDIR 0.29  --   --   --   --   --   --   --   < > = values in this interval not displayed. PATHOLOGY REPORT 12/30/2013 Diagnosis 1. Breast, lumpectomy, Right 02/14/2014 - INVASIVE GRADE II DUCTAL CARCINOMA SPANNING 0.9 CM IN GREATEST DIMENSION. - ASSOCIATED INTERMEDIATE GRADE DUCTAL CARCINOMA IN SITU. - MARGINS ARE NEGATIVE. - SEE ONCOLOGY TEMPLATE. 2. Breast, excision, Right - BENIGN BREAST PARENCHYMA WITH SCATTERED ACUTE AND CHRONIC INFLAMMATION. - NO ATYPIA, HYPERPLASIA, OR MALIGNANCY IDENTIFIED. 3. Lymph node, sentinel, biopsy, Right axillary - ONE BENIGN LYMPH NODE WITH NO TUMOR SEEN (0/1). 4. Lymph node, sentinel, biopsy, Right axillary - ONE BENIGN LYMPH NODE WITH NO TUMOR SEEN (0/1). 5. Lymph node, sentinel, biopsy, Right axillary - ONE BENIGN LYMPH NODE WITH NO TUMOR SEEN (0/1). 6. Lymph node, sentinel, biopsy, Right axillary - ONE BENIGN LYMPH NODE WITH NO TUMOR SEEN (0/1). 7. Lymph  node, sentinel, biopsy, Right axillary - ONE BENIGN LYMPH NODE WITH NO TUMOR SEEN (0/1). 1. BREAST, INVASIVE TUMOR, WITH LYMPH NODES PRESENT Specimen, including laterality and lymph node sampling (sentinel, non-sentinel): Right partial breast with right axillary sentinel lymph node sampling. Procedure: Right breast lumpectomy with right axillary sentinel lymph node mapping and biopsy. Histologic type: Invasive ductal carcinoma. Grade: II. Tubule formation: 3. Nuclear pleomorphism: 2. Mitotic:1. Tumor size (gross measurement and microscopic measurement): 0.9 cm. 1 of 4 FINAL for ROSSIE, SCARFONE ([id number]) Microscopic Comment(continued) Margins: Invasive, distance to closest margin: 0.4 cm (medial margin). In-situ, distance to closest margin: At least 0.4 cm (medial margin). Lymphovascular invasion: Definitive lymph/vascular invasion is not identified. Ductal carcinoma in situ: Yes, present. Grade: Intermediate grade. Extensive intraductal component: No. Lobular neoplasia: Not identified. Tumor focality: Unifocal. Treatment effect: Not  applicable. Extent of tumor: Tumor confined to breast parenchyma. Skin: Not received. Nipple: Not received. Skeletal muscle: Not received. Lymph nodes: Examined: 5 Sentinel. 5 Non-sentinel. 5 Total. Lymph nodes with metastasis: 0. Isolated tumor cells (< 0.2 mm): 0. Micrometastasis: (> 0.2 mm and < 2.0 mm): 0. Macrometastasis: (> 2.0 mm): 0. Extracapsular extension: Not applicable. Breast prognostic profile: Performed on previous biopsy ([id number]) Estrogen receptor: 98%, positive. Progesterone receptor: 81%, positive. Her 2 neu by C[facility]: 1.24 ratio, not amplified. Ki-67: 15% Non-neoplastic breast: Fat necrosis. TNM: pT1b, pN0.  ONCOTYPE DX RS score: 21    RADIOGRAPHIC STUDIES: I have personally reviewed the radiological images as listed and agreed with the findings in the report.  US Breast Ltd Uni Right Inc Axilla 12/29/2013       FINDINGS: On physical exam, I palpate no abnormality in the right breast 9 o'clock location.  Ultrasound is performed, showing an irregular shadowing hypoechoic mass right breast 9 o'clock location 4 cm from the nipple measuring 7 x 7 x 7 mm. This corresponds to the mammographic abnormality.  There is no right axillary lymphadenopathy.  IMPRESSION: Suspicious right breast mass 9 o'clock location. Ultrasound-guided core biopsy will be performed today and dictated separately.  RECOMMENDATION: Ultrasound-guided right breast biopsy  I have discussed the findings and recommendations with the patient. Results were also provided in writing at the conclusion of the visit. If applicable, a reminder letter will be sent to the patient regarding the next appointment.  BI-RADS CATEGORY  4: Suspicious.   Electronically Signed   By: Conchita Paris M.D.   On: 12/29/2013 15:59   Mm Screening Breast Tomo Bilateral 12/08/2013      FINDINGS: In the right breast, a possible mass warrants further evaluation with ultrasound. In the left breast, no findings suspicious for malignancy. Images were processed with CAD.   IMPRESSION: Further evaluation is suggested for possible mass in the right breast.  RECOMMENDATION: Ultrasound of the right breast. (Code:[id number]-[id number])  The patient will be contacted regarding the findings, and additional imaging will be scheduled.  BI-RADS CATEGORY  0: Incomplete. Need additional imaging evaluation and/or prior mammograms for comparison.   Electronically Signed   By: Curlene Dolphin M.D.   On: 12/08/2013 15:50     ASSESSMENT & PLAN:  59 year old postmenopausal woman, with past medical history of coronary disease, anxiety and depression, who was found by screening mammogram to have a T1b N0 M0 stage I a invasive ductal carcinoma, grade I-II, ER/PR strongly positive, HER-2 negative, Ki-67 10%.  1.  pT1b N0 M0 stage IA invasive ductal carcinoma of right breast, grade II, ER/PR strongly positive, HER-2  negative, Oncotype RS 21 -She is status post lumpectomy, and adjuvant breast radiation. -She declined adjuvant endocrine therapy, due to the concern of side effects. -We will continue the surveillance plan. She will see me every 6 months for the first few years, then you annually afterwards. She'll continue annual screening mammogram, she is due next months, I will order one for her today. -She is clinically doing well, exam was unremarkable today. Lab reviewed with her. No evidence of recurrence. -I encouraged her to have healthy diet and exercise regularly.  2. Osteopenia -She will continue calcium and vitamin D supplement.  3. Genetics -Given her strong family history of ovarian cancer, she was seen by genetic counselor -Negative BRCAPlus testing from [employer] [employer].  Genes tested include: BRCA1, BRCA2, CDH1, PTEN, PALB2, and TP53.  Report date is 01/25/14.   BRIP1 c.4921G>C VUS found on  the CancerNext panel.  The CancerNext gene panel offered by [facility] and includes sequencing and rearrangement analysis for the following 32 genes:   APC, ATM, BARD1, BMPR1A, BRCA1, BRCA2, BRIP1, CDH1, CDK4, CDKN2A, CHEK2, EPCAM, GREM1, MLH1, MRE11A, MSH2, MSH6, MUTYH, NBN, NF1, PALB2, PMS2, POLD1, POLE, PTEN, RAD50, RAD51D, SMAD4, SMARCA4, STK11, and TP53.  The report date is: February 13, 2014.  4. She'll continue follow-up with her primary care physician and cardiologist for her other medical problems.  Plan - I'll see her back in 6 month for lab and physical exam. Diagnostic Mammogram next months.  All questions were answered. The patient knows to call the clinic with any problems, questions or concerns. I spent 20 minutes counseling the patient face to face. The total time spent in the appointment was 30 minutes and more than 50% was on counseling.     Truitt Merle, MD 11/09/2014 5:41 PM

## 2014-11-14 ENCOUNTER — Other Ambulatory Visit: Payer: Self-pay | Admitting: Family Medicine

## 2014-11-16 DIAGNOSIS — F331 Major depressive disorder, recurrent, moderate: Secondary | ICD-10-CM | POA: Diagnosis not present

## 2014-12-05 ENCOUNTER — Other Ambulatory Visit: Payer: Self-pay | Admitting: Family Medicine

## 2014-12-12 ENCOUNTER — Ambulatory Visit
Admission: RE | Admit: 2014-12-12 | Discharge: 2014-12-12 | Disposition: A | Discharge status: Home / Self Care | Payer: Medicare Other | Source: Ambulatory Visit | Attending: Hematology | Admitting: Hematology

## 2014-12-12 ENCOUNTER — Other Ambulatory Visit: Payer: Self-pay | Admitting: Hematology

## 2014-12-12 DIAGNOSIS — R928 Other abnormal and inconclusive findings on diagnostic imaging of breast: Secondary | ICD-10-CM | POA: Diagnosis not present

## 2014-12-12 DIAGNOSIS — C50411 Malignant neoplasm of upper-outer quadrant of right female breast: Secondary | ICD-10-CM

## 2014-12-13 ENCOUNTER — Ambulatory Visit (INDEPENDENT_AMBULATORY_CARE_PROVIDER_SITE_OTHER): Payer: Medicare Other | Admitting: Family Medicine

## 2014-12-13 ENCOUNTER — Encounter: Payer: Self-pay | Admitting: Family Medicine

## 2014-12-13 VITALS — BP 122/64 | HR 56 | Temp 98.4°F | Ht 68.0 in | Wt 204.2 lb

## 2014-12-13 DIAGNOSIS — C50911 Malignant neoplasm of unspecified site of right female breast: Secondary | ICD-10-CM | POA: Diagnosis not present

## 2014-12-13 DIAGNOSIS — Z1159 Encounter for screening for other viral diseases: Secondary | ICD-10-CM

## 2014-12-13 DIAGNOSIS — I2510 Atherosclerotic heart disease of native coronary artery without angina pectoris: Secondary | ICD-10-CM | POA: Diagnosis not present

## 2014-12-13 DIAGNOSIS — M79601 Pain in right arm: Secondary | ICD-10-CM | POA: Diagnosis not present

## 2014-12-13 DIAGNOSIS — E785 Hyperlipidemia, unspecified: Secondary | ICD-10-CM

## 2014-12-13 DIAGNOSIS — Z114 Encounter for screening for human immunodeficiency virus [HIV]: Secondary | ICD-10-CM | POA: Diagnosis not present

## 2014-12-13 DIAGNOSIS — K219 Gastro-esophageal reflux disease without esophagitis: Secondary | ICD-10-CM

## 2014-12-13 DIAGNOSIS — N289 Disorder of kidney and ureter, unspecified: Secondary | ICD-10-CM | POA: Diagnosis not present

## 2014-12-13 LAB — HEPATITIS C ANTIBODY: HCV Ab: NEGATIVE

## 2014-12-13 MED ORDER — HYDROCODONE-ACETAMINOPHEN 5-325 MG PO TABS: ORAL_TABLET | ORAL | Status: DC

## 2014-12-13 NOTE — Progress Notes (Signed)
Subjective:    Patient ID: [name] [name], female    DOB: [birth year], 59 y.o.   MRN: [id number]  HPI Here for f/u of chronic health problems   Wt is down 2 lb  bmi of 31- obese   Screening Hep C/HIV Would like to get checked   Declines flu shot  Hx of breast ca in the last year - had lumpectome (ER and PR pos) , had radiation/ declined anti estrogen tx  Yesterday's mammogram was clear  Thrilled with that   Interested in zoster vaccine -perhaps in the future    Hyperlipidemia Lab Results  Component Value Date   CHOL 155 10/20/2014   CHOL 118 09/13/2013   CHOL 134 12/10/2011   Lab Results  Component Value Date   HDL 44* 10/20/2014   HDL 37* 09/13/2013   HDL 38.90* 12/10/2011   Lab Results  Component Value Date   LDLCALC 74 10/20/2014   LDLCALC 58 09/13/2013   LDLCALC 70 12/10/2011   Lab Results  Component Value Date   TRIG 183* 10/20/2014   TRIG 116 09/13/2013   TRIG 124.0 12/10/2011   Lab Results  Component Value Date   CHOLHDL 3.5 10/20/2014   CHOLHDL 3.2 09/13/2013   CHOLHDL 3 12/10/2011   No results found for: LDLDIRECT  On zocor and diet  Eats as healthy as she can  Not getting any exercise right now (she was- on the treadmill)- now having problems with her R arm since her lumpectomy  (on pain med)   Hx of renal insuff Lab Results  Component Value Date   CREATININE 1.2* 11/09/2014   BUN 18.3 11/09/2014   NA 141 11/09/2014   K 4.8 11/09/2014   CL 109 10/20/2014   CO2 22 11/09/2014   Cr is slt improved Hx of chronic uti   Needs chronic pain med for her R arm - ortho/ Dr Tonita Cong (who thought it was bursitis)- had injections 2-3 times and they did not help ? If carpal tunnel or a bone spur - (whole arm) Has had 2 MRI Decided it was a torn rotator cuff - and told she will need surgery later  ? There was a question of a tumor?- will have another MRI in several months "enchondroma" Last took tramadol (from the cancer center who sent her to  ortho)- it kept her dizzy and she fell and sust a concussion , also concentration was problematic  She cannot sleep due to the pain  nsaids have not helped  Usually tries to control her chronic pain with self hypnosis (with back pain in the past)  She tends to do well with hydrocodone for short periods of time -tried norco  Stomach problems  On zantac and protonix  Worried about kidneys and other poss side effects also   Patient Active Problem List   Diagnosis Date Noted  . Intractable chronic migraine without aura and without status migrainosus 03/15/2014  . Genetic testing 01/25/2014  . Breast cancer ([place])   . Breast cancer of upper-outer quadrant of right female breast ([place]) 12/30/2013  . Hypothyroid 12/10/2011  . Adverse effects of medication 10/28/2010  . Routine general medical examination at a health care facility 09/30/2010  . PERIODIC LIMB MOVEMENT DISORDER 08/16/2008  . HYPERSOMNIA 06/21/2008  . Somnolence, daytime 06/07/2008  . UNSPECIFIED VITAMIN D DEFICIENCY 04/07/2008  . UTI'S, CHRONIC 11/05/2007  . Renal insufficiency 09/16/2007  . Other abnormal glucose 09/16/2007  . HEMATURIA, MICROSCOPIC, HX OF 09/16/2007  . OTHER  ABNORMAL BLOOD CHEMISTRY 09/09/2007  . Hyperlipidemia 08/05/2007  . OBESITY 08/05/2007  . DEPRESSIVE DISORDER 08/05/2007  . Intractable chronic migraine without aura 08/05/2007  . Coronary atherosclerosis 08/05/2007  . GERD 08/05/2007  . DEGENERATIVE DISC DISEASE, LUMBAR SPINE 08/05/2007  . EDEMA 08/05/2007   Past Medical History  Diagnosis Date  . Depression   . Anorexia   . Migraine   . GERD (gastroesophageal reflux disease)   . Seasonal allergies   . Arrhythmia   . Frequent UTI   . Vitamin D deficiency   . Restless leg syndrome   . Microscopic hematuria   . Hypothyroid   . Breast cancer ([place])     ER+/PR+/Her2-  . Wears glasses   . Anxiety   . Stented coronary artery     LAD 95% 2007.  . Radiation 03/22/14-04/19/14    Breast Cancer  upper-outer  . Concussion 05/26/14  . Radiation 03/2014    right breast   Past Surgical History  Procedure Laterality Date  . Cholecystectomy open    . Appendectomy    . Abdominal hysterectomy    . Back surgery  1997    herniated disc repair  . Cystoscopy      neg  . Cardiac catheterization  2007    stent  . Radioactive seed guided mastectomy with axillary sentinel lymph node biopsy Right 02/14/2014    Procedure: RADIOACTIVE SEED GUIDED PARTIAL MASTECTOMY WITH AXILLARY SENTINEL LYMPH NODE BIOPSY;  Surgeon: Erroll Luna, MD;  Location: [facility];  Service: General;  Laterality: Right;   Social History  Substance Use Topics  . Smoking status: Never Smoker   . Smokeless tobacco: Never Used  . Alcohol Use: No     Comment: socially   Family History  Problem Relation Age of Onset  . Coronary artery disease Mother   . Thyroid disease Mother   . Alzheimer's disease Mother   . Diabetes Mother   . Heart disease Mother   . Cancer Brother     melnoma  . Ovarian cancer Paternal Aunt 75  . Bone cancer Paternal Uncle   . Alzheimer's disease Maternal Grandmother   . Heart disease Maternal Grandfather   . Pancreatic cancer Maternal Grandfather 42  . Ovarian cancer Maternal Aunt     dx in 70s  . Throat cancer Maternal Uncle     smoker  . Lung cancer Maternal Uncle     heavy smoker  . Ovarian cancer Paternal Aunt     dx in her 76s  . Cancer Cousin     "female cancer"  . Cancer Father    Allergies  Allergen Reactions  . Iohexol      Desc: throat selling resp distress hives.premedicate pt.entered 07/06/04, bsw.    Current Outpatient Prescriptions on File Prior to Visit  Medication Sig Dispense Refill  . aspirin 81 MG tablet Take 81 mg by mouth daily.    . cephALEXin (KEFLEX) 250 MG capsule Take 250 mg by mouth daily.      Marland Kitchen CINNAMON PO Take 1,000 mg by mouth daily.     . Coenzyme Q10 (CO Q-10) 200 MG CAPS Take 1 capsule by mouth daily.      . fish oil-omega-3  fatty acids 1000 MG capsule Take 1.2 g by mouth 2 (two) times daily.    . furosemide (LASIX) 20 MG tablet TAKE 1 TABLET (20 MG TOTAL) BY MOUTH DAILY. 90 tablet 0  . Ginkgo Biloba 120 MG CAPS Take 1 capsule by  mouth daily.      Marland Kitchen KLOR-CON M20 20 MEQ tablet TAKE 2 TABLETS BY MOUTH TWICE A DAY AND 1/2 TABLET AT BEDTIME 135 tablet 1  . levothyroxine (SYNTHROID, LEVOTHROID) 112 MCG tablet Take 112 mcg by mouth daily.    . Magnesium 250 MG TABS Take 250 mg by mouth daily.    . Multiple Vitamin (MULTIVITAMIN) capsule Take 1 capsule by mouth daily.    . nitroGLYCERIN (NITROSTAT) 0.4 MG SL tablet Place 1 tablet (0.4 mg total) under the tongue every 5 (five) minutes as needed. Pain 25 tablet 3  . pantoprazole (PROTONIX) 40 MG tablet TAKE 1 TABLET (40 MG TOTAL) BY MOUTH 2 (TWO) TIMES DAILY. 60 tablet 0  . PAZEO 0.7 % SOLN Apply 1 drop to eye daily. 1 drop each eye daily.    . propranolol ER (INDERAL LA) 60 MG 24 hr capsule Take 1 capsule (60 mg total) by mouth at bedtime. 90 capsule 3  . ranitidine (ZANTAC) 300 MG tablet TAKE 1 TABLET (300 MG TOTAL) BY MOUTH 2 (TWO) TIMES DAILY. 60 tablet 1  . Riboflavin 400 MG TABS Take 400 mg by mouth daily.    . simvastatin (ZOCOR) 40 MG tablet TAKE 1 TABLET BY MOUTH AT BEDTIME 30 tablet 9   No current facility-administered medications on file prior to visit.      Review of Systems Review of Systems  Constitutional: Negative for fever, appetite change, fatigue and unexpected weight change.  Eyes: Negative for pain and visual disturbance.  Respiratory: Negative for cough and shortness of breath.   Cardiovascular: Negative for cp or palpitations    Gastrointestinal: Negative for nausea, diarrhea and constipation.  Genitourinary: Negative for urgency and frequency.  Skin: Negative for pallor or rash   MSK pos for severe R arm and shoulder pain that keeps her from resting or sleeping Neurological: Negative for weakness, light-headedness, numbness and pos for chronic  headaches.  Hematological: Negative for adenopathy. Does not bruise/bleed easily.  Psychiatric/Behavioral: pos for anxiety and depression (seeing counselor ongoing)         Objective:   Physical Exam  Constitutional: She appears well-developed and well-nourished. No distress.  obese and well appearing    HENT:  Head: Normocephalic and atraumatic.  Mouth/Throat: Oropharynx is clear and moist.  Eyes: Conjunctivae and EOM are normal. Pupils are equal, round, and reactive to light.  Neck: Normal range of motion. Neck supple. No JVD present. Carotid bruit is not present. No thyromegaly present.  Cardiovascular: Normal rate, regular rhythm, normal heart sounds and intact distal pulses.  Exam reveals no gallop.   Pulmonary/Chest: Effort normal and breath sounds normal. No respiratory distress. She has no wheezes. She has no rales.  No crackles  Abdominal: Soft. Bowel sounds are normal. She exhibits no distension, no abdominal bruit and no mass. There is no tenderness.  Musculoskeletal: She exhibits tenderness. She exhibits no edema.  Poor rom of R shoulder and arm  Tender to the touch over humerus and shoulder area  She holds that arm close to her body  Lymphadenopathy:    She has no cervical adenopathy.  Neurological: She is alert. She has normal reflexes. No cranial nerve deficit. She exhibits normal muscle tone. Coordination normal.  Skin: Skin is warm and dry. No rash noted.  Psychiatric: Her mood appears anxious. Thought content is not paranoid. She exhibits a depressed mood. She expresses no homicidal and no suicidal ideation.  Depressed and anxious and tearful at times  Assessment & Plan:   Problem List Items Addressed This Visit      Digestive   GERD    Pt is worried about renal (and other) long term side eff of GERD tx  Since she is doing well - inst her to hold the protonix (continue ranitidine) and see how it goes (there will be some rebound acid at first)    Will see if this is tolerated  Will continue to follow         Genitourinary   Renal insufficiency - Primary    Lab Results  Component Value Date   CREATININE 1.2* 11/09/2014   This was slt improved Pt wants to see if she can do w/o PPI Also enc water intake Continue to follow On proph abx for uti and sees urology        Other   Arm pain, right    Per pt - unsure of cause  Poss rotator cuff tear- also mention made of ? Bone lesion- she tells me there will be another upcoming MRI  Needs pain control Intol of tramadol (even partial dose gives her dizziness and pre syncope) Acetaminophen not strong enough alone  No imp with trial of several nsaids Has had several bursal injections with no imp  norco gives her short term relief-since opt are limited we will try this carefully -disc risks of sedation/falls and habit- voiced understanding  She was inf re: our controlled subst agreement Aware of constipation side eff Hope this will be short term until she finds out if she needs surgery on her shoulder (sees Dr Tonita Cong) Px norco 5-325 to take tid prn pain- and she will let us know if this is adequate so we can titrate accordingly  >25 minutes spent in face to face time with patient, >50% spent in counselling or coordination of care       Breast cancer [facility])    S/p lumpectomy and radiation  Doing very well overall She declined anti estrogen treatment       Relevant Medications   topiramate (TOPAMAX) 50 MG tablet   HYDROcodone-acetaminophen (NORCO) 5-325 MG tablet   Hyperlipidemia    Disc goals for lipids and reasons to control them Rev labs with pt Rev low sat fat diet in detail On statin and diet       Need for hepatitis C screening test    Pt desires screening -will do this today      Relevant Orders   Hepatitis C antibody (Completed)   Screening for HIV (human immunodeficiency virus)    Pt desires screening -will do that today      Relevant Orders   HIV  antibody (with reflex) (Completed)

## 2014-12-13 NOTE — Patient Instructions (Signed)
Try holding your protonix if you want to - stay on the zantac  Hepatitis C and HIV screen today  Try the norco for pain - let's start with three times daily as needed - and you let me know how that goes / (if we need to change dosing)- also continue the self hypnosis  You may need a stool softener as well  Continue follow up with your other doctors  If you are interested in a shingles/zoster vaccine - call your insurance to check on coverage,( you should not get it within 1 month of other vaccines) , then call us for a prescription  for it to take to a pharmacy that gives the shot , or make a nurse visit to get it here depending on your coverage  Also check with your oncologist

## 2014-12-13 NOTE — Progress Notes (Signed)
Pre visit review using our clinic review tool, if applicable. No additional management support is needed unless otherwise documented below in the visit note. 

## 2014-12-14 DIAGNOSIS — M79601 Pain in right arm: Secondary | ICD-10-CM | POA: Insufficient documentation

## 2014-12-14 DIAGNOSIS — F331 Major depressive disorder, recurrent, moderate: Secondary | ICD-10-CM | POA: Diagnosis not present

## 2014-12-14 LAB — HIV ANTIBODY (ROUTINE TESTING W REFLEX): HIV 1&2 Ab, 4th Generation: NONREACTIVE

## 2014-12-14 NOTE — Assessment & Plan Note (Signed)
Per pt - unsure of cause  Poss rotator cuff tear- also mention made of ? Bone lesion- she tells me there will be another upcoming MRI  Needs pain control Intol of tramadol (even partial dose gives her dizziness and pre syncope) Acetaminophen not strong enough alone  No imp with trial of several nsaids Has had several bursal injections with no imp  norco gives her short term relief-since opt are limited we will try this carefully -disc risks of sedation/falls and habit- voiced understanding  She was inf re: our controlled subst agreement Aware of constipation side eff Hope this will be short term until she finds out if she needs surgery on her shoulder (sees Dr Tonita Cong) Px norco 5-325 to take tid prn pain- and she will let us know if this is adequate so we can titrate accordingly  >25 minutes spent in face to face time with patient, >50% spent in counselling or coordination of care

## 2014-12-14 NOTE — Assessment & Plan Note (Signed)
S/p lumpectomy and radiation  Doing very well overall She declined anti estrogen treatment

## 2014-12-14 NOTE — Assessment & Plan Note (Signed)
Pt desires screening -will do this today

## 2014-12-14 NOTE — Assessment & Plan Note (Signed)
Disc goals for lipids and reasons to control them Rev labs with pt Rev low sat fat diet in detail On statin and diet  

## 2014-12-14 NOTE — Assessment & Plan Note (Signed)
Pt is worried about renal (and other) long term side eff of GERD tx  Since she is doing well - inst her to hold the protonix (continue ranitidine) and see how it goes (there will be some rebound acid at first)  Will see if this is tolerated  Will continue to follow

## 2014-12-14 NOTE — Assessment & Plan Note (Signed)
Pt desires screening -will do that today

## 2014-12-14 NOTE — Assessment & Plan Note (Signed)
Lab Results  Component Value Date   CREATININE 1.2* 11/09/2014   This was slt improved Pt wants to see if she can do w/o PPI Also enc water intake Continue to follow On proph abx for uti and sees urology

## 2014-12-16 ENCOUNTER — Other Ambulatory Visit: Payer: Self-pay | Admitting: Family Medicine

## 2015-01-03 ENCOUNTER — Other Ambulatory Visit: Payer: Self-pay | Admitting: Family Medicine

## 2015-01-04 ENCOUNTER — Telehealth: Payer: Self-pay

## 2015-01-04 NOTE — Telephone Encounter (Addendum)
Called patient to r/s 04/30/15  appt w/ NP-Megan. Left vmail.

## 2015-01-08 ENCOUNTER — Other Ambulatory Visit: Payer: Self-pay

## 2015-01-08 MED ORDER — HYDROCODONE-ACETAMINOPHEN 5-325 MG PO TABS: ORAL_TABLET | ORAL | Status: DC

## 2015-01-08 NOTE — Telephone Encounter (Signed)
Pt left v/m requesting rx hydrocodone apap. Call when ready for pick up. rx last printed # 90 on 12/13/14. Last seen 12/13/14.

## 2015-01-08 NOTE — Telephone Encounter (Signed)
Px printed for pick up in IN box  

## 2015-01-09 ENCOUNTER — Encounter: Payer: Self-pay | Admitting: Family Medicine

## 2015-01-09 DIAGNOSIS — Z79891 Long term (current) use of opiate analgesic: Secondary | ICD-10-CM | POA: Diagnosis not present

## 2015-01-09 NOTE — Telephone Encounter (Signed)
Left voicemail letting pt know Rx ready for pick up 

## 2015-01-11 DIAGNOSIS — F331 Major depressive disorder, recurrent, moderate: Secondary | ICD-10-CM | POA: Diagnosis not present

## 2015-01-30 ENCOUNTER — Encounter: Payer: Self-pay | Admitting: Family Medicine

## 2015-02-10 ENCOUNTER — Other Ambulatory Visit: Payer: Self-pay | Admitting: Family Medicine

## 2015-02-15 ENCOUNTER — Other Ambulatory Visit: Payer: Self-pay

## 2015-02-15 DIAGNOSIS — F331 Major depressive disorder, recurrent, moderate: Secondary | ICD-10-CM | POA: Diagnosis not present

## 2015-02-15 NOTE — Telephone Encounter (Signed)
Pt left v/m requesting rx hydrocodone apap. Call when ready for pick up. rx last printed # 90 on 01/08/15; last seen 12/13/14.

## 2015-02-16 MED ORDER — HYDROCODONE-ACETAMINOPHEN 5-325 MG PO TABS: ORAL_TABLET | ORAL | Status: DC

## 2015-02-16 NOTE — Telephone Encounter (Signed)
Left voicemail letting pt know Rx ready for pick up 

## 2015-02-16 NOTE — Telephone Encounter (Signed)
Px printed for pick up in IN box  

## 2015-02-19 ENCOUNTER — Other Ambulatory Visit: Payer: Self-pay | Admitting: Neurology

## 2015-03-22 ENCOUNTER — Other Ambulatory Visit: Payer: Self-pay

## 2015-03-22 NOTE — Telephone Encounter (Signed)
Pt left v/m requesting rx hydrocodone apap. Call when ready for pick up. rx last printed # 90 on 02/16/15. Last seen 12/13/14.

## 2015-03-23 MED ORDER — HYDROCODONE-ACETAMINOPHEN 5-325 MG PO TABS: ORAL_TABLET | ORAL | Status: DC

## 2015-03-23 NOTE — Telephone Encounter (Signed)
Px printed for pick up in IN box  

## 2015-03-23 NOTE — Telephone Encounter (Signed)
Left voicemail letting pt know Rx ready for pick up 

## 2015-03-29 DIAGNOSIS — F331 Major depressive disorder, recurrent, moderate: Secondary | ICD-10-CM | POA: Diagnosis not present

## 2015-04-25 ENCOUNTER — Telehealth: Payer: Self-pay | Admitting: Family Medicine

## 2015-04-25 ENCOUNTER — Encounter: Payer: Self-pay | Admitting: Family Medicine

## 2015-04-25 MED ORDER — HYDROCODONE-ACETAMINOPHEN 5-325 MG PO TABS: ORAL_TABLET | ORAL | Status: DC

## 2015-04-25 NOTE — Telephone Encounter (Signed)
Refill of [place] to 4  Per day -due to increased pain  Let her know it is ready to pick up  Printed and in IN box

## 2015-04-26 DIAGNOSIS — F331 Major depressive disorder, recurrent, moderate: Secondary | ICD-10-CM | POA: Diagnosis not present

## 2015-04-26 NOTE — Telephone Encounter (Signed)
Left voicemail letting pt know Rx ready for pick up 

## 2015-04-30 ENCOUNTER — Encounter: Payer: Self-pay | Admitting: Neurology

## 2015-04-30 ENCOUNTER — Ambulatory Visit (INDEPENDENT_AMBULATORY_CARE_PROVIDER_SITE_OTHER): Payer: Medicare Other | Admitting: Neurology

## 2015-04-30 VITALS — BP 93/58 | HR 57 | Ht 68.0 in | Wt 209.0 lb

## 2015-04-30 DIAGNOSIS — G43719 Chronic migraine without aura, intractable, without status migrainosus: Secondary | ICD-10-CM | POA: Diagnosis not present

## 2015-04-30 MED ORDER — TOPIRAMATE 50 MG PO TABS: ORAL_TABLET | ORAL | Status: DC

## 2015-04-30 MED ORDER — GABAPENTIN 300 MG PO CAPS: 300.0000 mg | ORAL_CAPSULE | Freq: Two times a day (BID) | ORAL | Status: DC

## 2015-04-30 NOTE — Progress Notes (Signed)
Reason for visit: Headache  [name] [name] is an 60 y.o. female  History of present illness:  [name] [name] is a 60 year old right-handed white female with a history of intractable headache. The patient indicates that on Topamax she has gained some benefit, but she will have occasional severe headaches. She has a dull pressure sensation in the back of the head on the left that is daily, occasionally the headache pain may be very severe and may last up to one day. The patient has had 4 such events since last seen. She indicates that she is taking hydrocodone tablets 4 times daily for her right shoulder pain. She indicates that the right shoulder does not bother her until she goes to sleep at night. She has been told she has a rotator cuff tear and surgery was recommended. The patient returns to this office for an evaluation.  Past Medical History  Diagnosis Date  . Depression   . Anorexia   . Migraine   . GERD (gastroesophageal reflux disease)   . Seasonal allergies   . Arrhythmia   . Frequent UTI   . Vitamin D deficiency   . Restless leg syndrome   . Microscopic hematuria   . Hypothyroid   . Breast cancer ([place])     ER+/PR+/Her2-  . Wears glasses   . Anxiety   . Stented coronary artery     LAD 95% 2007.  . Radiation 03/22/14-04/19/14    Breast Cancer upper-outer  . Concussion 05/26/14  . Radiation 03/2014    right breast    Past Surgical History  Procedure Laterality Date  . Cholecystectomy open    . Appendectomy    . Abdominal hysterectomy    . Back surgery  1997    herniated disc repair  . Cystoscopy      neg  . Cardiac catheterization  2007    stent  . Radioactive seed guided mastectomy with axillary sentinel lymph node biopsy Right 02/14/2014    Procedure: RADIOACTIVE SEED GUIDED PARTIAL MASTECTOMY WITH AXILLARY SENTINEL LYMPH NODE BIOPSY;  Surgeon: Erroll Luna, MD;  Location: [facility];  Service: General;  Laterality: Right;    Family History    Problem Relation Age of Onset  . Coronary artery disease Mother   . Thyroid disease Mother   . Alzheimer's disease Mother   . Diabetes Mother   . Heart disease Mother   . Cancer Brother     melnoma  . Ovarian cancer Paternal Aunt 44  . Bone cancer Paternal Uncle   . Alzheimer's disease Maternal Grandmother   . Heart disease Maternal Grandfather   . Pancreatic cancer Maternal Grandfather 24  . Ovarian cancer Maternal Aunt     dx in 42s  . Throat cancer Maternal Uncle     smoker  . Lung cancer Maternal Uncle     heavy smoker  . Ovarian cancer Paternal Aunt     dx in her 62s  . Cancer Cousin     "female cancer"  . Cancer Father     Social history:  reports that she has never smoked. She has never used smokeless tobacco. She reports that she does not drink alcohol or use illicit drugs.    Allergies  Allergen Reactions  . Iohexol      Desc: throat selling resp distress hives.premedicate pt.entered 07/06/04, bsw.     Medications:  Prior to Admission medications   Medication Sig Start Date End Date Taking? Authorizing Provider  aspirin  81 MG tablet Take 81 mg by mouth daily.   Yes Historical Provider, MD  cephALEXin (KEFLEX) 250 MG capsule Take 250 mg by mouth daily.     Yes Historical Provider, MD  CINNAMON PO Take 1,000 mg by mouth daily.    Yes Historical Provider, MD  Coenzyme Q10 (CO Q-10) 200 MG CAPS Take 1 capsule by mouth daily.     Yes Historical Provider, MD  fish oil-omega-3 fatty acids 1000 MG capsule Take 1.2 g by mouth 2 (two) times daily.   Yes Historical Provider, MD  furosemide (LASIX) 20 MG tablet TAKE 1 TABLET (20 MG TOTAL) BY MOUTH DAILY. 02/12/15  Yes Abner Greenspan, MD  Ginkgo Biloba 120 MG CAPS Take 1 capsule by mouth daily.     Yes Historical Provider, MD  HYDROcodone-acetaminophen (NORCO) 5-325 MG tablet Take one pill by mouth up to 4 times daily as needed for pain 04/25/15  Yes Abner Greenspan, MD  KLOR-CON M20 20 MEQ tablet TAKE 2 TABLETS BY MOUTH TWICE  A DAY AND 1/2 TABLET AT BEDTIME 12/18/14  Yes [place], MD  levothyroxine (SYNTHROID, LEVOTHROID) 112 MCG tablet Take 112 mcg by mouth daily.   Yes Historical Provider, MD  Magnesium 250 MG TABS Take 250 mg by mouth daily.   Yes Historical Provider, MD  Multiple Vitamin (MULTIVITAMIN) capsule Take 1 capsule by mouth daily.   Yes Historical Provider, MD  nitroGLYCERIN (NITROSTAT) 0.4 MG SL tablet Place 1 tablet (0.4 mg total) under the tongue every 5 (five) minutes as needed. Pain 03/28/14  Yes Minus Breeding, MD  pantoprazole (PROTONIX) 40 MG tablet TAKE 1 TABLET (40 MG TOTAL) BY MOUTH 2 (TWO) TIMES DAILY. 01/03/15  Yes Abner Greenspan, MD  PAZEO 0.7 % SOLN Apply 1 drop to eye daily. 1 drop each eye daily. 11/08/14  Yes Historical Provider, MD  propranolol ER (INDERAL LA) 60 MG 24 hr capsule Take 1 capsule (60 mg total) by mouth at bedtime. 06/07/14  Yes Minus Breeding, MD  ranitidine (ZANTAC) 300 MG tablet TAKE 1 TABLET (300 MG TOTAL) BY MOUTH 2 (TWO) TIMES DAILY. 01/03/15  Yes Abner Greenspan, MD  Riboflavin 400 MG TABS Take 400 mg by mouth daily.   Yes Historical Provider, MD  simvastatin (ZOCOR) 40 MG tablet TAKE 1 TABLET BY MOUTH AT BEDTIME 08/21/14  Yes Minus Breeding, MD  topiramate (TOPAMAX) 50 MG tablet TAKE ONE TABLET IN THE MORNING AND 2 IN THE EVENING 11/26/14  Yes Historical Provider, MD    ROS:  Out of a complete 14 system review of symptoms, the patient complains only of the following symptoms, and all other reviewed systems are negative.  Runny nose Cold intolerance Restless legs, daytime sleepiness Back pain Dizziness, headache, speech difficulty Decreased concentration  Blood pressure 93/58, pulse 57, height '5\' 8"'  (1.727 m), weight 209 lb (94.802 kg).  Physical Exam  General: The patient is alert and cooperative at the time of the examination. The patient is moderately obese.  Skin: No significant peripheral edema is noted.   Neurologic Exam  Mental status: The  patient is alert and oriented x 3 at the time of the examination. The patient has apparent normal recent and remote memory, with an apparently normal attention span and concentration ability.   Cranial nerves: Facial symmetry is present. Speech is normal, no aphasia or dysarthria is noted. Extraocular movements are full. Visual fields are full.  Motor: The patient has good strength in all 4 extremities.  Sensory examination: Soft touch sensation is symmetric on the face, arms, and legs.  Coordination: The patient has good finger-nose-finger and heel-to-shin bilaterally.  Gait and station: The patient has a normal gait. Tandem gait is normal. Romberg is negative. No drift is seen.  Reflexes: Deep tendon reflexes are symmetric.   Assessment/Plan:  1. Chronic daily headache  2. Right shoulder pain  The patient will be placed on gabapentin in hopes of helping both the headache and the shoulder discomfort. The patient will start on gabapentin 300 mg twice daily. She will continue on the Topamax taking 50 mg in the morning and 100 mg in the evening. Will follow-up in 4 or 5 months.  Jill Alexanders MD 04/30/2015 6:25 PM  [facility] [address] [place], [place] [address]  Phone [phone] Fax [phone]

## 2015-05-10 ENCOUNTER — Other Ambulatory Visit (HOSPITAL_BASED_OUTPATIENT_CLINIC_OR_DEPARTMENT_OTHER): Payer: Medicare Other

## 2015-05-10 ENCOUNTER — Ambulatory Visit (HOSPITAL_BASED_OUTPATIENT_CLINIC_OR_DEPARTMENT_OTHER): Payer: Medicare Other | Admitting: Hematology

## 2015-05-10 ENCOUNTER — Telehealth: Payer: Self-pay | Admitting: Hematology

## 2015-05-10 ENCOUNTER — Encounter: Payer: Self-pay | Admitting: Hematology

## 2015-05-10 VITALS — BP 100/46 | HR 64 | Temp 97.7°F | Resp 18 | Ht 68.0 in | Wt 212.8 lb

## 2015-05-10 DIAGNOSIS — Z170 Estrogen receptor positive status [ER+]: Secondary | ICD-10-CM

## 2015-05-10 DIAGNOSIS — C50411 Malignant neoplasm of upper-outer quadrant of right female breast: Secondary | ICD-10-CM

## 2015-05-10 DIAGNOSIS — M8580 Other specified disorders of bone density and structure, unspecified site: Secondary | ICD-10-CM | POA: Diagnosis not present

## 2015-05-10 DIAGNOSIS — M79601 Pain in right arm: Secondary | ICD-10-CM | POA: Diagnosis not present

## 2015-05-10 DIAGNOSIS — M899 Disorder of bone, unspecified: Secondary | ICD-10-CM

## 2015-05-10 LAB — CBC WITH DIFFERENTIAL/PLATELET
BASO%: 0.4 % (ref 0.0–2.0)
BASOS ABS: 0 10*3/uL (ref 0.0–0.1)
EOS ABS: 0.2 10*3/uL (ref 0.0–0.5)
EOS%: 3.3 % (ref 0.0–7.0)
HEMATOCRIT: 36.7 % (ref 34.8–46.6)
HEMOGLOBIN: 12.7 g/dL (ref 11.6–15.9)
LYMPH#: 1.6 10*3/uL (ref 0.9–3.3)
LYMPH%: 29 % (ref 14.0–49.7)
MCH: 31 pg (ref 25.1–34.0)
MCHC: 34.6 g/dL (ref 31.5–36.0)
MCV: 89.5 fL (ref 79.5–101.0)
MONO#: 0.5 10*3/uL (ref 0.1–0.9)
MONO%: 8.8 % (ref 0.0–14.0)
NEUT#: 3.2 10*3/uL (ref 1.5–6.5)
NEUT%: 58.5 % (ref 38.4–76.8)
Platelets: 142 10*3/uL — ABNORMAL LOW (ref 145–400)
RBC: 4.1 10*6/uL (ref 3.70–5.45)
RDW: 13.3 % (ref 11.2–14.5)
WBC: 5.4 10*3/uL (ref 3.9–10.3)

## 2015-05-10 LAB — COMPREHENSIVE METABOLIC PANEL
ALK PHOS: 53 U/L (ref 40–150)
ALT: 30 U/L (ref 0–55)
AST: 26 U/L (ref 5–34)
Albumin: 3.7 g/dL (ref 3.5–5.0)
Anion Gap: 7 mEq/L (ref 3–11)
BUN: 22.3 mg/dL (ref 7.0–26.0)
CHLORIDE: 113 meq/L — AB (ref 98–109)
CO2: 22 meq/L (ref 22–29)
Calcium: 9.6 mg/dL (ref 8.4–10.4)
Creatinine: 1.4 mg/dL — ABNORMAL HIGH (ref 0.6–1.1)
EGFR: 42 mL/min/{1.73_m2} — AB (ref >90)
GLUCOSE: 122 mg/dL (ref 70–140)
POTASSIUM: 4.2 meq/L (ref 3.5–5.1)
SODIUM: 143 meq/L (ref 136–145)
Total Bilirubin: 0.83 mg/dL (ref 0.20–1.20)
Total Protein: 6.7 g/dL (ref 6.4–8.3)

## 2015-05-10 NOTE — Progress Notes (Signed)
[place]  [phone] Fax:([phone]  Clinic Follow Up Note   Patient Care Team: Abner Greenspan, MD as PCP - General Minus Breeding, MD as Consulting Physician (Cardiology) Holley Bouche, NP as Nurse Practitioner (Nurse Practitioner) Erroll Luna, MD as Consulting Physician (General Surgery) Thea Silversmith, MD as Consulting Physician (Radiation Oncology) Truitt Merle, MD as Consulting Physician (Hematology) Susa Day, MD as Consulting Physician (Orthopedic Surgery) 05/10/2015  CHIEF COMPLAINTS Follow up stage IA right breast cancer, pT1bN0    Breast cancer of upper-outer quadrant of right female breast ([place])   12/29/2013 Imaging Screening mammogram and [place]: an irregular shadowing hypoechoic mass right breast 9 o'clock location 4 cm from the nipple measuring 7 x 7 x 7 mm. No adenopathy    12/29/2013 Initial Diagnosis Breast cancer of upper-outer quadrant of right female breast   12/29/2013 Initial Biopsy Grade I-II IDC, ER+ (98%), PR+ (81%), HER2- (ratio 1.24), Ki67 15%    01/11/2014 Procedure Genetic counseling/testing: Revealed 1 VUS in ATM gene, p.D1641H (c.4921G>C).  Otherwise, genetic testing negative.    02/14/2014 Surgery Right lumpectomy with SLNB ([place]): Grade 2 IDC spanning 0.9 cm with associated grade 2 DCIS.  Negative margins. 5 sentinel lymph nodes negative.  HER2 repeated and neg. (ratio 1.16).   02/14/2014 Pathologic Stage pT1bpN0; Stage IA   02/14/2014 Oncotype testing Recurrence score 21 (13% risk of distant recurrence); no adjuvant chemo offered.    03/22/2014 - 04/19/2014 Radiation Therapy Adjuvant radiation Pablo Ledger); Right breast: Total dose 42.72 Gy over 21 fractions. Right breast boost: Total dose 10 Gy over 5 fractions.     Anti-estrogen oral therapy Anti-estrogen therapy was recommended by Dr. Burr Medico; pt declines anti-estrogen treatment.     05/25/2014 Survivorship Survivorship Care Plan given to patient and reviewed with her  in-person.      HISTORY OF PRESENTING ILLNESS (01/04/2014):  [name] [name] 60 y.o. female presents to our breast multidisciplinary clinic today because of newly diagnosed breast cancer.   This was discovered by routine screening mammogram a few weeks ago. She underwent diagnostic mammogram and ultrasound on 12/29/2013 which showed an irregular shadowing hypoechoic mass in right breast likely position measuring 7 mm. No adenopathy. She underwent core needle biopsy on the same day, which showed grade 1-2 invasive ductal adenocarcinoma, ER positive PR positive and HER-2 negative, Ki-67 10%.  She has been feeling very anxious since the biopsy. She does have history of coronary artery disease, status post stent placement in [facility] 7. Since the breast cancer diagnosis, she has had several episodes of anxiety attack, hypoventilation, with chest tightness. She appeared quite nervous when I met her first in the clinic. Before the biopsy, she felt well in general, she does have intermittent migraine headaches, chronic leg swollen, intermittent chest pain, denies any other new symptoms, no weight loss or other changes.  She lives with her only son, who is 3 years old, on disability due to depression and suicidal ideas. She is his caregiver.  INTERIM HISTORY: She returns for follow up. She complains about worsening right upper arm pain, which started after her breast surgery in generally 2016. No limited range of motion of her right shoulder, no tenderness, pain is persistent, moderate to severe. She has tried tramadol cannot tolerate due to the dizziness, she has been on Norco 4 tablets a day for the past months, which was prescribed by her primary care physician. She was seen by orthopedic surgeon Dr. Tonita Cong, head MRI and other tests, but no  definitive diagnosis or treatment plan. She is very frustrated, and would like to see a different orthopedic surgeon for second opinion. She otherwise doing well, denies  any other pain or other symptoms.    MEDICAL HISTORY:  Past Medical History  Diagnosis Date  . Depression   . Anorexia   . Migraine   . GERD (gastroesophageal reflux disease)   . Seasonal allergies   . Arrhythmia   . CAD (coronary artery disease), s/p stent placement  2007   . Frequent UTI   . Edema   . Vitamin D deficiency   . Restless leg syndrome   . Microscopic hematuria   . Hypothyroid   She has psychologist Dr. Rica Mote   SURGICAL HISTORY: Past Surgical History  Procedure Laterality Date  . Cholecystectomy open    . Appendectomy    . Abdominal hysterectomy  1983  . Back surgery  1997    herniated disc repair  . Cystoscopy      neg  Back surgery in 1997   SOCIAL HISTORY: History   Social History  . Marital Status: Widowed, husband died of colon cancer in 12     Spouse Name: N/A    Number of Children: One son  60 yo, on disability for depression, who lives with her   . Years of Education: N/A   Occupational History  . Secretary, Clinical cytogeneticist, retired     Social History Main Topics  . Smoking status: Former Smoker    Start date: 10/14/1971  . Smokeless tobacco: Never Used  . Alcohol Use: Yes     Comment: socially  . Drug Use: No  . Sexual Activity: Not on file   Other Topics Concern  . Not on file   Social History Narrative   Does not get any regular exercise      Doctors   -uro-Ottlein   -cardio--hochrein   -Neurology--Dr. Jannifer Franklin   -allergies--Dr. Carmelina Peal   Gyn past--Dr. Warnell Forester   Couselor--Dr. Mitchum   Endo-- Dr. Chalmers Cater      Lives with son who is 65 years old    FAMILY HISTORY: Family History  Problem Relation Age of Onset  . Coronary artery disease Mother   . Thyroid disease Mother   . Alzheimer's disease Mother   . Diabetes Mother   . Heart disease Mother   . Cancer Brother     melnoma  . Ovarian cancer Paternal Aunt 24  . Bone cancer Paternal Uncle   . Alzheimer's disease Maternal Grandmother   . Heart disease Maternal  Grandfather   . Pancreatic cancer Maternal Grandfather 65  . Ovarian cancer Maternal Aunt     dx in 11s  . Throat cancer Maternal Uncle     smoker  . Lung cancer Maternal Uncle     heavy smoker  . Ovarian cancer Paternal Aunt     dx in her 2s  . Cancer Cousin     "female cancer"  . Cancer Father    GYN HISTORY  Menarchal: 10 LMP: 1983 hysrectomy  Contraceptive: 2 years  HRT: was on for 10-20 years, off several years  GxP1: several miscarriages     ALLERGIES:  is allergic to iohexol.  MEDICATIONS:    Medication List       This list is accurate as of: 05/10/15  3:37 PM.  Always use your most recent med list.               aspirin 81 MG tablet  Take  81 mg by mouth daily.     CINNAMON PO  Take 1,000 mg by mouth daily.     Co Q-10 200 MG Caps  Take 1 capsule by mouth daily.     fish oil-omega-3 fatty acids 1000 MG capsule  Take 1.2 g by mouth 2 (two) times daily.     furosemide 20 MG tablet  Commonly known as:  LASIX  TAKE 1 TABLET (20 MG TOTAL) BY MOUTH DAILY.     gabapentin 300 MG capsule  Commonly known as:  NEURONTIN  Take 1 capsule (300 mg total) by mouth 2 (two) times daily.     Ginkgo Biloba 120 MG Caps  Take 1 capsule by mouth daily.     HYDROcodone-acetaminophen 5-325 MG tablet  Commonly known as:  NORCO  Take one pill by mouth up to 4 times daily as needed for pain     KLOR-CON M20 20 MEQ tablet  Generic drug:  potassium chloride SA  TAKE 2 TABLETS BY MOUTH TWICE A DAY AND 1/2 TABLET AT BEDTIME     levothyroxine 112 MCG tablet  Commonly known as:  SYNTHROID, LEVOTHROID  Take 112 mcg by mouth daily.     Magnesium 250 MG Tabs  Take 250 mg by mouth daily.     multivitamin capsule  Take 1 capsule by mouth daily.     nitroGLYCERIN 0.4 MG SL tablet  Commonly known as:  NITROSTAT  Place 1 tablet (0.4 mg total) under the tongue every 5 (five) minutes as needed. Pain     pantoprazole 40 MG tablet  Commonly known as:  PROTONIX  TAKE 1  TABLET (40 MG TOTAL) BY MOUTH 2 (TWO) TIMES DAILY.     PAZEO 0.7 % Soln  Generic drug:  Olopatadine HCl  Apply 1 drop to eye daily. 1 drop each eye daily.     propranolol ER 60 MG 24 hr capsule  Commonly known as:  INDERAL LA  Take 1 capsule (60 mg total) by mouth at bedtime.     ranitidine 300 MG tablet  Commonly known as:  ZANTAC  TAKE 1 TABLET (300 MG TOTAL) BY MOUTH 2 (TWO) TIMES DAILY.     Riboflavin 400 MG Tabs  Take 400 mg by mouth daily.     simvastatin 40 MG tablet  Commonly known as:  ZOCOR  TAKE 1 TABLET BY MOUTH AT BEDTIME     topiramate 50 MG tablet  Commonly known as:  TOPAMAX  TAKE ONE TABLET IN THE MORNING AND 2 IN THE EVENING         REVIEW OF SYSTEMS:   Constitutional: Denies fevers, chills or abnormal night sweats Eyes: Denies blurriness of vision, double vision or watery eyes Ears, nose, mouth, throat, and face: Denies mucositis or sore throat Respiratory: Denies cough, dyspnea or wheezes Cardiovascular: (+) palpitation and intermittent chest pain, (+) lower extremity swelling Gastrointestinal:  Denies nausea, heartburn or change in bowel habits Skin: Denies abnormal skin rashes Lymphatics: Denies new lymphadenopathy or easy bruising Neurological:Denies numbness, tingling or new weaknesses, (+) right upper arm pain  Behavioral/Psych: (+) very anxious All other systems were reviewed with the patient and are negative.  PHYSICAL EXAMINATION: ECOG PERFORMANCE STATUS: 0 - Asymptomatic  Filed Vitals:   05/10/15 1456  BP: 100/46  Pulse: 64  Temp: 97.7 F (36.5 C)  Resp: 18   Filed Weights   05/10/15 1456  Weight: 212 lb 12.8 oz (96.525 kg)    GENERAL:alert, no distress and comfortable SKIN:  skin color, texture, turgor are normal, no rashes or significant lesions EYES: normal, conjunctiva are pink and non-injected, sclera clear OROPHARYNX:no exudate, no erythema and lips, buccal mucosa, and tongue normal  NECK: supple, thyroid normal size,  non-tender, without nodularity LYMPH:  no palpable lymphadenopathy in the cervical, axillary or inguinal LUNGS: clear to auscultation and percussion with normal breathing effort HEART: regular rate & rhythm and no murmurs and no lower extremity edema ABDOMEN:abdomen soft, non-tender and normal bowel sounds Musculoskeletal:no cyanosis of digits and no clubbing, no significant edema on all her extremities, no tenderness of the right shoulder or arm, the range of right shoulder is normal. PSYCH: alert & oriented x 3 with fluent speech NEURO: no focal motor/sensory deficits Breasts: Breast inspection showed them to be symmetrical with no nipple discharge. Palpation of the breasts and axilla revealed no obvious mass that I could appreciate. Surgical scar in the right breast is well-healed with some scar tissue underneath.    LABORATORY DATA:  I have reviewed the data as listed Lab Results  Component Value Date   WBC 5.4 05/10/2015   HGB 12.7 05/10/2015   HCT 36.7 05/10/2015   MCV 89.5 05/10/2015   PLT 142* 05/10/2015    Recent Labs  10/20/14 1511 11/09/14 1411 05/10/15 1412  NA 141 141 143  K 4.5 4.8 4.2  CL 109  --   --   CO2 '23 22 22  ' GLUCOSE 108* 87 122  BUN 21 18.3 22.3  CREATININE 1.31* 1.2* 1.4*  CALCIUM 9.6 9.8 9.6  PROT 6.9 7.0 6.7  ALBUMIN 4.4 4.1 3.7  AST '27 27 26  ' ALT 32* 32 30  ALKPHOS 45 49 53  BILITOT 1.6* 1.21* 0.83   PATHOLOGY REPORT 12/30/2013 Diagnosis 1. Breast, lumpectomy, Right 02/14/2014 - INVASIVE GRADE II DUCTAL CARCINOMA SPANNING 0.9 CM IN GREATEST DIMENSION. - ASSOCIATED INTERMEDIATE GRADE DUCTAL CARCINOMA IN SITU. - MARGINS ARE NEGATIVE. - SEE ONCOLOGY TEMPLATE. 2. Breast, excision, Right - BENIGN BREAST PARENCHYMA WITH SCATTERED ACUTE AND CHRONIC INFLAMMATION. - NO ATYPIA, HYPERPLASIA, OR MALIGNANCY IDENTIFIED. 3. Lymph node, sentinel, biopsy, Right axillary - ONE BENIGN LYMPH NODE WITH NO TUMOR SEEN (0/1). 4. Lymph node, sentinel, biopsy,  Right axillary - ONE BENIGN LYMPH NODE WITH NO TUMOR SEEN (0/1). 5. Lymph node, sentinel, biopsy, Right axillary - ONE BENIGN LYMPH NODE WITH NO TUMOR SEEN (0/1). 6. Lymph node, sentinel, biopsy, Right axillary - ONE BENIGN LYMPH NODE WITH NO TUMOR SEEN (0/1). 7. Lymph node, sentinel, biopsy, Right axillary - ONE BENIGN LYMPH NODE WITH NO TUMOR SEEN (0/1). 1. BREAST, INVASIVE TUMOR, WITH LYMPH NODES PRESENT Specimen, including laterality and lymph node sampling (sentinel, non-sentinel): Right partial breast with right axillary sentinel lymph node sampling. Procedure: Right breast lumpectomy with right axillary sentinel lymph node mapping and biopsy. Histologic type: Invasive ductal carcinoma. Grade: II. Tubule formation: 3. Nuclear pleomorphism: 2. Mitotic:1. Tumor size (gross measurement and microscopic measurement): 0.9 cm. 1 of 4 FINAL for SHAKEDA, PEARSE ([id number]) Microscopic Comment(continued) Margins: Invasive, distance to closest margin: 0.4 cm (medial margin). In-situ, distance to closest margin: At least 0.4 cm (medial margin). Lymphovascular invasion: Definitive lymph/vascular invasion is not identified. Ductal carcinoma in situ: Yes, present. Grade: Intermediate grade. Extensive intraductal component: No. Lobular neoplasia: Not identified. Tumor focality: Unifocal. Treatment effect: Not applicable. Extent of tumor: Tumor confined to breast parenchyma. Skin: Not received. Nipple: Not received. Skeletal muscle: Not received. Lymph nodes: Examined: 5 Sentinel. 5 Non-sentinel. 5 Total. Lymph nodes with metastasis: 0. Isolated  tumor cells (< 0.2 mm): 0. Micrometastasis: (> 0.2 mm and < 2.0 mm): 0. Macrometastasis: (> 2.0 mm): 0. Extracapsular extension: Not applicable. Breast prognostic profile: Performed on previous biopsy ([id number]) Estrogen receptor: 98%, positive. Progesterone receptor: 81%, positive. Her 2 neu by CISH: 1.24 ratio, not  amplified. Ki-67: 15% Non-neoplastic breast: Fat necrosis. TNM: pT1b, pN0.  ONCOTYPE DX RS score: 21    RADIOGRAPHIC STUDIES: I have personally reviewed the radiological images as listed and agreed with the findings in the report.  Mm DIAG Breast Tomo Bilateral 12/12/2014 IMPRESSION: No mammographic evidence of malignancy in either breast.  RECOMMENDATION: Screening mammogram in one year.(Code:SM-B-01Y)   ASSESSMENT & PLAN:  60 year old postmenopausal woman, with past medical history of coronary disease, anxiety and depression, who was found by screening mammogram to have a T1b N0 M0 stage I a invasive ductal carcinoma, grade I-II, ER/PR strongly positive, HER-2 negative, Ki-67 10%.  1.  pT1b N0 M0 stage IA invasive ductal carcinoma of right breast, grade II, ER/PR strongly positive, HER-2 negative, Oncotype RS 21 -She is status post lumpectomy, and adjuvant breast radiation. -She declined adjuvant endocrine therapy, due to the concern of side effects. -We will continue the surveillance plan. She will see me every 6 months for the first few years, then annually afterwards. She'll continue annual screening mammogram, she is due in July -She is clinically doing well, exam was unremarkable today. Lab reviewed with her. No evidence of recurrence. -I encouraged her to have healthy diet and exercise regularly.  2. Osteopenia -She will continue calcium and vitamin D supplement.  3. Genetics -Given her strong family history of ovarian cancer, she was seen by genetic counselor -Negative BRCAPlus testing from [employer].  Genes tested include: BRCA1, BRCA2, CDH1, PTEN, PALB2, and TP53.  Report date is 01/25/14.   BRIP1 c.4921G>C VUS found on the CancerNext panel.  The CancerNext gene panel offered by [employer] and includes sequencing and rearrangement analysis for the following 32 genes:   APC, ATM, BARD1, BMPR1A, BRCA1, BRCA2, BRIP1, CDH1, CDK4, CDKN2A, CHEK2, EPCAM, GREM1, MLH1,  MRE11A, MSH2, MSH6, MUTYH, NBN, NF1, PALB2, PMS2, POLD1, POLE, PTEN, RAD50, RAD51D, SMAD4, SMARCA4, STK11, and TP53.  The report date is: February 13, 2014.  4. Right upper arm pain, ?enchondroma -her x-ray of right humerus in April 2016 showed a 1.3 cm sclerotic lesion, possible enchondroma. -I'll refer her orthopedic surgeon Dr. Berenice Primas for a second opinion.  5. She'll continue follow-up with her primary care physician and cardiologist for her other medical problems.  Plan - I'll see her back in 6 month for lab and physical exam. Diagnostic Mammogram in 11/2015 -Refer her to orthopedic surgeon Dr. Berenice Primas for right upper arm pain.  All questions were answered. The patient knows to call the clinic with any problems, questions or concerns. I spent 20 minutes counseling the patient face to face. The total time spent in the appointment was 30 minutes and more than 50% was on counseling.     Truitt Merle, MD 05/10/2015 3:37 PM

## 2015-05-10 NOTE — Telephone Encounter (Signed)
Gave and printed appt sched and avs fo rpt for OCT °

## 2015-05-15 DIAGNOSIS — E890 Postprocedural hypothyroidism: Secondary | ICD-10-CM | POA: Diagnosis not present

## 2015-05-15 DIAGNOSIS — E559 Vitamin D deficiency, unspecified: Secondary | ICD-10-CM | POA: Diagnosis not present

## 2015-05-22 DIAGNOSIS — E559 Vitamin D deficiency, unspecified: Secondary | ICD-10-CM | POA: Diagnosis not present

## 2015-05-22 DIAGNOSIS — E7800 Pure hypercholesterolemia, unspecified: Secondary | ICD-10-CM | POA: Diagnosis not present

## 2015-05-22 DIAGNOSIS — E890 Postprocedural hypothyroidism: Secondary | ICD-10-CM | POA: Diagnosis not present

## 2015-05-24 ENCOUNTER — Other Ambulatory Visit: Payer: Self-pay | Admitting: Cardiology

## 2015-05-24 DIAGNOSIS — F331 Major depressive disorder, recurrent, moderate: Secondary | ICD-10-CM | POA: Diagnosis not present

## 2015-05-24 NOTE — Telephone Encounter (Signed)
Rx has been sent to the pharmacy electronically. ° °

## 2015-05-28 ENCOUNTER — Encounter: Payer: Self-pay | Admitting: Hematology

## 2015-05-28 ENCOUNTER — Telehealth: Payer: Self-pay | Admitting: *Deleted

## 2015-05-28 ENCOUNTER — Other Ambulatory Visit: Payer: Self-pay | Admitting: Family Medicine

## 2015-05-28 MED ORDER — HYDROCODONE-ACETAMINOPHEN 5-325 MG PO TABS: ORAL_TABLET | ORAL | Status: DC

## 2015-05-28 NOTE — Telephone Encounter (Signed)
Last office visit 12/13/2014.  Last refilled 04/25/2015 for #120 with no refills.  Ok to refill?

## 2015-05-28 NOTE — Telephone Encounter (Signed)
Px printed for pick up in IN box  

## 2015-05-28 NOTE — Telephone Encounter (Signed)
Left voicemail letting pt know Rx ready for pick up 

## 2015-05-28 NOTE — Telephone Encounter (Signed)
Spoke with Ailene Ravel, pt coordinator @ Dr. Dorna Leitz, orthopedist about pt's referral.  Faxed referral along with last office notes from Dr. Burr Medico to [place].   Ailene Ravel stated pt will be contacted with appt date and time.  Nurse sent a reply via MyChart to pt with same info. Dr. Dorna Leitz      Phone     [phone]      ;       Fax        [phone].

## 2015-06-05 DIAGNOSIS — M542 Cervicalgia: Secondary | ICD-10-CM | POA: Diagnosis not present

## 2015-06-06 ENCOUNTER — Other Ambulatory Visit: Payer: Self-pay | Admitting: Family Medicine

## 2015-06-06 ENCOUNTER — Other Ambulatory Visit: Payer: Self-pay | Admitting: Cardiology

## 2015-06-06 NOTE — Telephone Encounter (Signed)
Rx has been sent to the pharmacy electronically. ° °

## 2015-06-11 DIAGNOSIS — M79601 Pain in right arm: Secondary | ICD-10-CM | POA: Diagnosis not present

## 2015-06-18 DIAGNOSIS — Z853 Personal history of malignant neoplasm of breast: Secondary | ICD-10-CM | POA: Diagnosis not present

## 2015-06-21 DIAGNOSIS — F331 Major depressive disorder, recurrent, moderate: Secondary | ICD-10-CM | POA: Diagnosis not present

## 2015-06-27 ENCOUNTER — Encounter: Payer: Self-pay | Admitting: Adult Health

## 2015-06-27 NOTE — Progress Notes (Signed)
A birthday card was mailed to the patient today on behalf of the Survivorship Program at [facility].   Adger Cantera, NP Survivorship Program [facility] [phone]  

## 2015-07-04 DIAGNOSIS — G5601 Carpal tunnel syndrome, right upper limb: Secondary | ICD-10-CM | POA: Diagnosis not present

## 2015-07-10 ENCOUNTER — Telehealth: Payer: Self-pay | Admitting: *Deleted

## 2015-07-10 MED ORDER — POTASSIUM CHLORIDE CRYS ER 20 MEQ PO TBCR: EXTENDED_RELEASE_TABLET | ORAL | Status: DC

## 2015-07-10 NOTE — Telephone Encounter (Signed)
Received fax refill request for 90 day supply, done

## 2015-07-11 ENCOUNTER — Other Ambulatory Visit: Payer: Self-pay | Admitting: Family Medicine

## 2015-07-11 MED ORDER — HYDROCODONE-ACETAMINOPHEN 5-325 MG PO TABS: ORAL_TABLET | ORAL | Status: DC

## 2015-07-11 NOTE — Telephone Encounter (Signed)
Px printed for pick up in IN box  

## 2015-07-11 NOTE — Telephone Encounter (Signed)
Last office visit 12/13/2014.  Last refilled 05/28/2015 for #120 with no refills.  Ok to refill?

## 2015-07-12 NOTE — Telephone Encounter (Signed)
Left voicemail letting pt know Rx ready for pick up 

## 2015-07-19 DIAGNOSIS — F331 Major depressive disorder, recurrent, moderate: Secondary | ICD-10-CM | POA: Diagnosis not present

## 2015-07-23 DIAGNOSIS — E890 Postprocedural hypothyroidism: Secondary | ICD-10-CM | POA: Diagnosis not present

## 2015-07-24 DIAGNOSIS — M7501 Adhesive capsulitis of right shoulder: Secondary | ICD-10-CM | POA: Diagnosis not present

## 2015-08-14 DIAGNOSIS — M7501 Adhesive capsulitis of right shoulder: Secondary | ICD-10-CM | POA: Diagnosis not present

## 2015-08-16 DIAGNOSIS — F331 Major depressive disorder, recurrent, moderate: Secondary | ICD-10-CM | POA: Diagnosis not present

## 2015-08-21 ENCOUNTER — Other Ambulatory Visit: Payer: Self-pay | Admitting: *Deleted

## 2015-08-21 DIAGNOSIS — M25511 Pain in right shoulder: Secondary | ICD-10-CM | POA: Diagnosis not present

## 2015-08-21 DIAGNOSIS — M7501 Adhesive capsulitis of right shoulder: Secondary | ICD-10-CM | POA: Diagnosis not present

## 2015-08-21 MED ORDER — GABAPENTIN 300 MG PO CAPS: 300.0000 mg | ORAL_CAPSULE | Freq: Two times a day (BID) | ORAL | 5 refills | Status: DC

## 2015-08-23 DIAGNOSIS — M25511 Pain in right shoulder: Secondary | ICD-10-CM | POA: Diagnosis not present

## 2015-08-23 DIAGNOSIS — M7501 Adhesive capsulitis of right shoulder: Secondary | ICD-10-CM | POA: Diagnosis not present

## 2015-08-27 DIAGNOSIS — M25511 Pain in right shoulder: Secondary | ICD-10-CM | POA: Diagnosis not present

## 2015-08-27 DIAGNOSIS — M7501 Adhesive capsulitis of right shoulder: Secondary | ICD-10-CM | POA: Diagnosis not present

## 2015-08-29 ENCOUNTER — Ambulatory Visit (INDEPENDENT_AMBULATORY_CARE_PROVIDER_SITE_OTHER): Payer: Medicare Other | Admitting: Adult Health

## 2015-08-29 ENCOUNTER — Encounter: Payer: Self-pay | Admitting: Adult Health

## 2015-08-29 ENCOUNTER — Other Ambulatory Visit: Payer: Self-pay | Admitting: Family Medicine

## 2015-08-29 VITALS — Ht 68.0 in | Wt 208.6 lb

## 2015-08-29 DIAGNOSIS — G43019 Migraine without aura, intractable, without status migrainosus: Secondary | ICD-10-CM | POA: Diagnosis not present

## 2015-08-29 NOTE — Progress Notes (Signed)
I have read the note, and I agree with the clinical assessment and plan.  [name] [name] KEITH   

## 2015-08-29 NOTE — Progress Notes (Signed)
PATIENT: [name] [name] DOB: [birth year]  REASON FOR VISIT: follow up- headache HISTORY FROM: patient  HISTORY OF PRESENT ILLNESS: [name] [name] is a 60 year old female with a history of intractable headaches. She returns today for follow-up. At the last visit Topamax was increased to 50 mg in the morning and 100 mg in the evening. She was also started on gabapentin 300 mg twice a day. She reports that this may help some for her headaches. She continues to have daily headaches. She states that she's been having trouble with her thyroid since April. She feels that this may be contributing to her headaches. She states that she has a true migraine once every 2 weeks. Her headaches are located on the left side. She describes the pain as a sharp shooting pain initially. She does have photophobia, phonophobia, nausea and vomiting. She states that she continues on Norco for her shoulder pain. She just started therapy last week for this. She returns today for evaluation.   HISTORY 04/30/15 (MM): [name] [name] is a 60 year old right-handed white female with a history of intractable headache. The patient indicates that on Topamax she has gained some benefit, but she will have occasional severe headaches. She has a dull pressure sensation in the back of the head on the left that is daily, occasionally the headache pain may be very severe and may last up to one day. The patient has had 4 such events since last seen. She indicates that she is taking hydrocodone tablets 4 times daily for her right shoulder pain. She indicates that the right shoulder does not bother her until she goes to sleep at night. She has been told she has a rotator cuff tear and surgery was recommended. The patient returns to this office for an evaluation.  REVIEW OF SYSTEMS: Out of a complete 14 system review of symptoms, the patient complains only of the following symptoms, and all other reviewed systems are negative.  Memory loss, dizziness,  headache, numbness, speech difficulty, decreased concentration, walking difficulty, frequent waking, daytime sleepiness, light sensitivity, hearing loss, runny nose, chest pain, activity change  ALLERGIES: Allergies  Allergen Reactions  . Iohexol      Desc: throat selling resp distress hives.premedicate pt.entered 07/06/04, bsw.     HOME MEDICATIONS: Outpatient Medications Prior to Visit  Medication Sig Dispense Refill  . aspirin 81 MG tablet Take 81 mg by mouth daily.    Marland Kitchen CINNAMON PO Take 1,000 mg by mouth daily.     . Coenzyme Q10 (CO Q-10) 200 MG CAPS Take 1 capsule by mouth daily.      . fish oil-omega-3 fatty acids 1000 MG capsule Take 1.2 g by mouth 2 (two) times daily.    . furosemide (LASIX) 20 MG tablet TAKE 1 TABLET (20 MG TOTAL) BY MOUTH DAILY. 90 tablet 2  . gabapentin (NEURONTIN) 300 MG capsule Take 1 capsule (300 mg total) by mouth 2 (two) times daily. 60 capsule 5  . Ginkgo Biloba 120 MG CAPS Take 1 capsule by mouth daily.      Marland Kitchen HYDROcodone-acetaminophen (NORCO) 5-325 MG tablet Take one pill by mouth up to 4 times daily as needed for pain 120 tablet 0  . levothyroxine (SYNTHROID, LEVOTHROID) 112 MCG tablet Take 112 mcg by mouth daily.    . Magnesium 250 MG TABS Take 250 mg by mouth daily.    . Multiple Vitamin (MULTIVITAMIN) capsule Take 1 capsule by mouth daily.    . nitroGLYCERIN (NITROSTAT) 0.4 MG SL tablet  Place 1 tablet (0.4 mg total) under the tongue every 5 (five) minutes as needed. Pain 25 tablet 3  . pantoprazole (PROTONIX) 40 MG tablet TAKE 1 TABLET (40 MG TOTAL) BY MOUTH 2 (TWO) TIMES DAILY. 60 tablet 11  . PAZEO 0.7 % SOLN Apply 1 drop to eye daily. 1 drop each eye daily.    . potassium chloride SA (KLOR-CON M20) 20 MEQ tablet TAKE 2 TABLETS BY MOUTH TWICE A DAY AND 1/2 TABLET AT BEDTIME 405 tablet 0  . propranolol ER (INDERAL LA) 60 MG 24 hr capsule TAKE 1 CAPSULE (60 MG TOTAL) BY MOUTH AT BEDTIME. 90 capsule 3  . ranitidine (ZANTAC) 300 MG tablet TAKE 1  TABLET (300 MG TOTAL) BY MOUTH 2 (TWO) TIMES DAILY. 60 tablet 11  . Riboflavin 400 MG TABS Take 400 mg by mouth daily.    . simvastatin (ZOCOR) 40 MG tablet TAKE 1 TABLET BY MOUTH AT BEDTIME 30 tablet 5  . topiramate (TOPAMAX) 50 MG tablet TAKE ONE TABLET IN THE MORNING AND 2 IN THE EVENING 270 tablet 3   No facility-administered medications prior to visit.     PAST MEDICAL HISTORY: Past Medical History:  Diagnosis Date  . Anorexia   . Anxiety   . Arrhythmia   . Breast cancer ([place])    ER+/PR+/Her2-  . Concussion 05/26/14  . Depression   . Frequent UTI   . GERD (gastroesophageal reflux disease)   . Hypothyroid   . Microscopic hematuria   . Migraine   . Radiation 03/22/14-04/19/14   Breast Cancer upper-outer  . Radiation 03/2014   right breast  . Restless leg syndrome   . Seasonal allergies   . Stented coronary artery    LAD 95% 2007.  . Vitamin D deficiency   . Wears glasses     PAST SURGICAL HISTORY: Past Surgical History:  Procedure Laterality Date  . ABDOMINAL HYSTERECTOMY    . APPENDECTOMY    . back surgery  1997   herniated disc repair  . CARDIAC CATHETERIZATION  2007   stent  . CHOLECYSTECTOMY OPEN    . CYSTOSCOPY     neg  . RADIOACTIVE SEED GUIDED MASTECTOMY WITH AXILLARY SENTINEL LYMPH NODE BIOPSY Right 02/14/2014   Procedure: RADIOACTIVE SEED GUIDED PARTIAL MASTECTOMY WITH AXILLARY SENTINEL LYMPH NODE BIOPSY;  Surgeon: Erroll Luna, MD;  Location: [facility];  Service: General;  Laterality: Right;    FAMILY HISTORY: Family History  Problem Relation Age of Onset  . Coronary artery disease Mother   . Thyroid disease Mother   . Alzheimer's disease Mother   . Diabetes Mother   . Heart disease Mother   . Cancer Brother     melnoma  . Ovarian cancer Paternal Aunt 68  . Bone cancer Paternal Uncle   . Alzheimer's disease Maternal Grandmother   . Heart disease Maternal Grandfather   . Pancreatic cancer Maternal Grandfather 17  . Ovarian  cancer Maternal Aunt     dx in 34s  . Throat cancer Maternal Uncle     smoker  . Lung cancer Maternal Uncle     heavy smoker  . Ovarian cancer Paternal Aunt     dx in her 88s  . Cancer Cousin     "female cancer"  . Cancer Father     SOCIAL HISTORY: Social History   Social History  . Marital status: Widowed    Spouse name: N/A  . Number of children: 1  . Years of education: some  coll.   Occupational History  . not working    Social History Main Topics  . Smoking status: Never Smoker  . Smokeless tobacco: Never Used  . Alcohol use No     Comment: socially  . Drug use: No  . Sexual activity: Not on file   Other Topics Concern  . Not on file   Social History Narrative   Does not get any regular exercise      Doctors   -uro-[facility]   -cardio--hochrein   -Neurology--Dr. Jannifer Franklin   -allergies--Dr. Carmelina Peal   Gyn past--Dr. Warnell Forester   Couselor--Dr. Mitchum   Endo-- Dr. Chalmers Cater      Lives with son who is 71 years old   Patient is right handed.   Patient drinks 1-2 cups of caffeine daily.      PHYSICAL EXAM  Vitals:   08/29/15 1459  Weight: 208 lb 9.6 oz (94.6 kg)  Height: 5' 8" (1.727 m)   Body mass index is 31.72 kg/m.  Generalized: Well developed, in no acute distress   Neurological examination  Mentation: Alert oriented to time, place, history taking. Follows all commands speech and language fluent Cranial nerve II-XII: Pupils were equal round reactive to light. Extraocular movements were full, visual field were full on confrontational test. Facial sensation and strength were normal. Uvula tongue midline. Head turning and shoulder shrug  were normal and symmetric. Motor: The motor testing reveals 5 over 5 strength of all 4 extremities. Good symmetric motor tone is noted throughout.  Sensory: Sensory testing is intact to soft touch on all 4 extremities. No evidence of extinction is noted.  Coordination: Cerebellar testing reveals good finger-nose-finger and  heel-to-shin bilaterally.  Gait and station: Gait is normal. Tandem gait is normal. Romberg is negative. No drift is seen.  Reflexes: Deep tendon reflexes are symmetric and normal bilaterally.   DIAGNOSTIC DATA (LABS, IMAGING, TESTING) - I reviewed patient records, labs, notes, testing and imaging myself where available.  Lab Results  Component Value Date   WBC 5.4 05/10/2015   HGB 12.7 05/10/2015   HCT 36.7 05/10/2015   MCV 89.5 05/10/2015   PLT 142 (L) 05/10/2015      Component Value Date/Time   NA 143 05/10/2015 1412   K 4.2 05/10/2015 1412   CL 109 10/20/2014 1511   CO2 22 05/10/2015 1412   GLUCOSE 122 05/10/2015 1412   BUN 22.3 05/10/2015 1412   CREATININE 1.4 (H) 05/10/2015 1412   CALCIUM 9.6 05/10/2015 1412   PROT 6.7 05/10/2015 1412   ALBUMIN 3.7 05/10/2015 1412   AST 26 05/10/2015 1412   ALT 30 05/10/2015 1412   ALKPHOS 53 05/10/2015 1412   BILITOT 0.83 05/10/2015 1412   GFRNONAA 47 (L) 02/13/2014 1100   GFRAA 54 (L) 02/13/2014 1100   Lab Results  Component Value Date   CHOL 155 10/20/2014   HDL 44 (L) 10/20/2014   LDLCALC 74 10/20/2014   TRIG 183 (H) 10/20/2014   CHOLHDL 3.5 10/20/2014        ASSESSMENT AND PLAN 60 y.o. year old female  has a past medical history of Anorexia; Anxiety; Arrhythmia; Breast cancer ([place]); Concussion (05/26/14); Depression; Frequent UTI; GERD (gastroesophageal reflux disease); Hypothyroid; Microscopic hematuria; Migraine; Radiation (03/22/14-04/19/14); Radiation (03/2014); Restless leg syndrome; Seasonal allergies; Stented coronary artery; Vitamin D deficiency; and Wears glasses. here with:  1. Headache  Overall the patient has remained same. She feels that her headaches may be contributed to her thyroid. She goes back at the end  of the month for labs. Also advised that taking Norco could cause a rebound headache.Will continue on Topamax 50 mg in the morning and 100 mg in the evening. Continue Topamax 300 mg twice a day. She  verbalized understanding. Advised that if her symptoms worsen or she develops any new symptoms she should let us know. Follow-up in 6 months or sooner if needed.     Ward Givens, MSN, NP-C 08/29/2015, 3:04 PM [facility] [address], [place], [place] [id number] [phone]

## 2015-08-29 NOTE — Patient Instructions (Signed)
Continue Topamax and gabapentin If your symptoms worsen or you develop new symptoms please let us know.

## 2015-08-30 DIAGNOSIS — M7501 Adhesive capsulitis of right shoulder: Secondary | ICD-10-CM | POA: Diagnosis not present

## 2015-08-30 DIAGNOSIS — M25511 Pain in right shoulder: Secondary | ICD-10-CM | POA: Diagnosis not present

## 2015-08-30 NOTE — Telephone Encounter (Signed)
Dr. Glori Bickers and myself are both out of the office today, last filled on 07/11/15 #120 tabs with 0 refills

## 2015-08-31 NOTE — Telephone Encounter (Signed)
will await PCP return. Pt notified.

## 2015-08-31 NOTE — Telephone Encounter (Signed)
I am not back until Monday If she cannot wait until then- would one of the other providers consider refilling times one?   Thanks

## 2015-08-31 NOTE — Telephone Encounter (Signed)
It seemed like patient was requesting change in therapy not refill.

## 2015-09-03 ENCOUNTER — Other Ambulatory Visit: Payer: Self-pay | Admitting: Family Medicine

## 2015-09-03 NOTE — Telephone Encounter (Signed)
All narcotics can produce analgesic rebound  She does not tolerate tramadol and nsaids have been tried  Acetaminophen was not helpful  She has had several bursal injections w/o help  I know the problem itself needs to be resolved by orthopedics   The only reason we put her on a narcotic is because of all of the above -I do not know what to try next   Would she be open to a pain clinic referral for help with this ?   Did her neurologist have any other ideas for pain control?

## 2015-09-04 MED ORDER — TRAMADOL HCL 50 MG PO TABS: 50.0000 mg | ORAL_TABLET | Freq: Three times a day (TID) | ORAL | 0 refills | Status: DC | PRN

## 2015-09-04 NOTE — Telephone Encounter (Signed)
See other phone note with Dr. Marliss Coots comments.   Called home # and no voicemail set up, called cell # and no answer so left voicemail requesting pt to call the office back

## 2015-09-04 NOTE — Telephone Encounter (Signed)
Rx called in as prescribed, per pt request I left voicemail letting pt know Rx sent and advise her of Dr. Marliss Coots comments

## 2015-09-04 NOTE — Telephone Encounter (Signed)
Pt said that she is in PT right now so she declined a referral to Pain management. Pt said that her neurologist suggested Tramadol. Pt said the only side eff she had in the past to the Tramadol was it made her a little dizzy but given pt has tried all the other meds she is willing to try the tramadol again, she did ? If there is anything she can do for the dizziness if not she still wants to try the tramadol, please [facility]

## 2015-09-04 NOTE — Telephone Encounter (Signed)
Called home # and no voicemail set up, called cell # and no answer so left voicemail requesting pt to call the office back

## 2015-09-04 NOTE — Telephone Encounter (Signed)
Addressed through other phone note ?

## 2015-09-04 NOTE — Telephone Encounter (Signed)
Px written for call in   Nothing I know of to prevent dizziness -perhaps take with food  Let's send in small supply first just to see how she tolerates it and then she can let me know

## 2015-09-06 DIAGNOSIS — M25511 Pain in right shoulder: Secondary | ICD-10-CM | POA: Diagnosis not present

## 2015-09-06 DIAGNOSIS — M7501 Adhesive capsulitis of right shoulder: Secondary | ICD-10-CM | POA: Diagnosis not present

## 2015-09-10 DIAGNOSIS — M7501 Adhesive capsulitis of right shoulder: Secondary | ICD-10-CM | POA: Diagnosis not present

## 2015-09-10 DIAGNOSIS — M25511 Pain in right shoulder: Secondary | ICD-10-CM | POA: Diagnosis not present

## 2015-09-12 DIAGNOSIS — F331 Major depressive disorder, recurrent, moderate: Secondary | ICD-10-CM | POA: Diagnosis not present

## 2015-09-13 DIAGNOSIS — M25511 Pain in right shoulder: Secondary | ICD-10-CM | POA: Diagnosis not present

## 2015-09-13 DIAGNOSIS — M7501 Adhesive capsulitis of right shoulder: Secondary | ICD-10-CM | POA: Diagnosis not present

## 2015-09-14 ENCOUNTER — Other Ambulatory Visit: Payer: Self-pay | Admitting: Family Medicine

## 2015-09-14 DIAGNOSIS — M67911 Unspecified disorder of synovium and tendon, right shoulder: Secondary | ICD-10-CM | POA: Diagnosis not present

## 2015-09-16 ENCOUNTER — Other Ambulatory Visit: Payer: Self-pay | Admitting: Family Medicine

## 2015-09-17 MED ORDER — TRAMADOL HCL 50 MG PO TABS: 50.0000 mg | ORAL_TABLET | Freq: Three times a day (TID) | ORAL | 0 refills | Status: DC | PRN

## 2015-09-17 NOTE — Telephone Encounter (Signed)
Last filled on 09/04/15 #30 with 0 refill, no recent/future appts but she is f/u with oncology, please advise

## 2015-09-17 NOTE — Telephone Encounter (Signed)
Rx called in as prescribed 

## 2015-09-17 NOTE — Telephone Encounter (Signed)
Px written for call in   

## 2015-09-18 ENCOUNTER — Other Ambulatory Visit: Payer: Self-pay | Admitting: Orthopedic Surgery

## 2015-09-18 DIAGNOSIS — M25511 Pain in right shoulder: Secondary | ICD-10-CM

## 2015-09-19 NOTE — Progress Notes (Signed)
13 hour prep [facility]. Called pt with instructions and to call if any questions about her prep.

## 2015-09-24 DIAGNOSIS — E890 Postprocedural hypothyroidism: Secondary | ICD-10-CM | POA: Diagnosis not present

## 2015-10-02 ENCOUNTER — Ambulatory Visit
Admission: RE | Admit: 2015-10-02 | Discharge: 2015-10-02 | Disposition: A | Discharge status: Home / Self Care | Payer: Medicare Other | Source: Ambulatory Visit | Attending: Orthopedic Surgery | Admitting: Orthopedic Surgery

## 2015-10-02 DIAGNOSIS — M25511 Pain in right shoulder: Secondary | ICD-10-CM

## 2015-10-02 DIAGNOSIS — M7581 Other shoulder lesions, right shoulder: Secondary | ICD-10-CM | POA: Diagnosis not present

## 2015-10-02 MED ORDER — IOPAMIDOL (ISOVUE-M 200) INJECTION 41%
15.0000 mL | Freq: Once | INTRAMUSCULAR | Status: AC
  Administered 2015-10-02: 15 mL via INTRA_ARTICULAR

## 2015-10-05 ENCOUNTER — Other Ambulatory Visit: Payer: Self-pay | Admitting: Family Medicine

## 2015-10-05 MED ORDER — HYDROCODONE-ACETAMINOPHEN 5-325 MG PO TABS: ORAL_TABLET | ORAL | 0 refills | Status: DC

## 2015-10-05 NOTE — Telephone Encounter (Signed)
Last OV was 12/13/15, no future appts, last filled on 07/11/15 #120 tabs with 0 refills, please advise

## 2015-10-05 NOTE — Telephone Encounter (Signed)
Left voicemail letting pt know Rx ready for pick up 

## 2015-10-05 NOTE — Telephone Encounter (Signed)
Px printed for pick up in IN box  

## 2015-10-09 DIAGNOSIS — M67911 Unspecified disorder of synovium and tendon, right shoulder: Secondary | ICD-10-CM | POA: Diagnosis not present

## 2015-10-12 ENCOUNTER — Other Ambulatory Visit: Payer: Self-pay | Admitting: *Deleted

## 2015-10-12 MED ORDER — PANTOPRAZOLE SODIUM 40 MG PO TBEC: DELAYED_RELEASE_TABLET | ORAL | 3 refills | Status: DC

## 2015-10-12 NOTE — Telephone Encounter (Signed)
Done

## 2015-10-12 NOTE — Telephone Encounter (Signed)
No recent/future appts., please advise  

## 2015-10-12 NOTE — Telephone Encounter (Signed)
Please refill times 3 

## 2015-10-15 NOTE — Progress Notes (Addendum)
HPI The patient presents for followup of her known coronary disease. Since I last saw her she has had increasing palpitations. She's had trouble with her thyroid apparently being slightly hyperthyroid. She's had palpitations with this. This is distressing to her. He's also having problems with her shoulder history shoulder surgery soon. She's not having any chest pressure, neck or arm discomfort. She's not having any presyncope or syncope. She has no new shortness of breath, PND or orthopnea. He said no weight gain or edema.   Allergies  Allergen Reactions  . Iohexol      Desc: throat selling resp distress hives.premedicate pt.entered 07/06/04, bsw.     Current Outpatient Prescriptions  Medication Sig Dispense Refill  . aspirin 81 MG tablet Take 81 mg by mouth daily.    . cephALEXin (KEFLEX) 250 MG capsule Take 250 mg by mouth at bedtime.     Marland Kitchen CINNAMON PO Take 1,000 mg by mouth daily.     . Coenzyme Q10 (CO Q-10) 200 MG CAPS Take 1 capsule by mouth daily.      . fish oil-omega-3 fatty acids 1000 MG capsule Take 1.2 g by mouth 2 (two) times daily.    . furosemide (LASIX) 20 MG tablet TAKE 1 TABLET (20 MG TOTAL) BY MOUTH DAILY. 90 tablet 2  . gabapentin (NEURONTIN) 300 MG capsule Take 1 capsule (300 mg total) by mouth 2 (two) times daily. 60 capsule 5  . Ginkgo Biloba 120 MG CAPS Take 1 capsule by mouth daily.      Marland Kitchen HYDROcodone-acetaminophen (NORCO) 5-325 MG tablet Take one pill by mouth up to 4 times daily as needed for pain 120 tablet 0  . levothyroxine (SYNTHROID, LEVOTHROID) 137 MCG tablet Take 137 mcg by mouth daily before breakfast.     . Magnesium 250 MG TABS Take 250 mg by mouth daily.    . Multiple Vitamin (MULTIVITAMIN) capsule Take 1 capsule by mouth daily.    . nitroGLYCERIN (NITROSTAT) 0.4 MG SL tablet Place 1 tablet (0.4 mg total) under the tongue every 5 (five) minutes as needed. Pain 25 tablet 3  . pantoprazole (PROTONIX) 40 MG tablet TAKE 1 TABLET (40 MG TOTAL) BY MOUTH  2 (TWO) TIMES DAILY. 180 tablet 3  . PAZEO 0.7 % SOLN Apply 1 drop to eye daily. 1 drop each eye daily.    . potassium chloride SA (KLOR-CON M20) 20 MEQ tablet TAKE 2 TABLETS BY MOUTH TWICE A DAY AND 1/2 TABLET AT BEDTIME 405 tablet 0  . propranolol ER (INDERAL LA) 60 MG 24 hr capsule TAKE 1 CAPSULE (60 MG TOTAL) BY MOUTH AT BEDTIME. 90 capsule 3  . ranitidine (ZANTAC) 300 MG tablet TAKE 1 TABLET (300 MG TOTAL) BY MOUTH 2 (TWO) TIMES DAILY. 60 tablet 11  . Riboflavin 400 MG TABS Take 400 mg by mouth daily.    . simvastatin (ZOCOR) 40 MG tablet TAKE 1 TABLET BY MOUTH AT BEDTIME 30 tablet 5  . topiramate (TOPAMAX) 50 MG tablet TAKE ONE TABLET IN THE MORNING AND 2 IN THE EVENING 270 tablet 3   No current facility-administered medications for this visit.     Past Medical History:  Diagnosis Date  . Anorexia   . Anxiety   . Arrhythmia   . Breast cancer ([place])    ER+/PR+/Her2-  . Concussion 05/26/14  . Depression   . Frequent UTI   . GERD (gastroesophageal reflux disease)   . Hypothyroid   . Microscopic hematuria   . Migraine   .  Radiation 03/22/14-04/19/14   Breast Cancer upper-outer  . Radiation 03/2014   right breast  . Restless leg syndrome   . Seasonal allergies   . Stented coronary artery    LAD 95% 2007.  . Vitamin D deficiency   . Wears glasses     Past Surgical History:  Procedure Laterality Date  . ABDOMINAL HYSTERECTOMY    . APPENDECTOMY    . back surgery  1997   herniated disc repair  . CARDIAC CATHETERIZATION  2007   stent  . CHOLECYSTECTOMY OPEN    . CYSTOSCOPY     neg  . RADIOACTIVE SEED GUIDED MASTECTOMY WITH AXILLARY SENTINEL LYMPH NODE BIOPSY Right 02/14/2014   Procedure: RADIOACTIVE SEED GUIDED PARTIAL MASTECTOMY WITH AXILLARY SENTINEL LYMPH NODE BIOPSY;  Surgeon: Erroll Luna, MD;  Location: [facility];  Service: General;  Laterality: Right;    ROS:   As stated in the HPI and negative for all other systems.  PHYSICAL EXAM BP 122/80    Pulse (!) 50   Ht '5\' 8"'  (1.727 m)   Wt 205 lb (93 kg)   BMI 31.17 kg/m  GENERAL:  Well appearing NECK:  No jugular venous distention, waveform within normal limits, carotid upstroke brisk and symmetric, no bruits, no thyromegaly LUNGS:  Clear to auscultation bilaterally BACK:  No CVA tenderness CHEST:  Unremarkable HEART:  PMI not displaced or sustained,S1 and S2 within normal limits, no S3, no S4, no clicks, no rubs, no murmurs ABD:  Flat, positive bowel sounds normal in frequency in pitch, no bruits, no rebound, no guarding, no midline pulsatile mass, no hepatomegaly, no splenomegaly EXT:  2 plus pulses throughout, no edema, no cyanosis no clubbing   EKG:  Sinus bradycardia, rate 51, axis within normal limits, intervals within normal limits, no acute ST-T wave changes.   ASSESSMENT AND PLAN   Palpitations - I prescribed an Alive Cor to help monitor her rhythm.  I am reluctant to increase any of her meds as she has baseline bradycardia.  However I will use the Alive Cor to guide therapy.   CAD - She is going for shoulder surgery.    I will bring the patient back for a POET (Plain Old Exercise Test). This will allow me to screen for obstructive coronary disease, risk stratify and very importantly provide a prescription for exercise.  DYSLIPIDEMIA -  She will come back for fasting lipid profile and liver enzymes.

## 2015-10-16 ENCOUNTER — Encounter: Payer: Self-pay | Admitting: Cardiology

## 2015-10-16 ENCOUNTER — Ambulatory Visit (INDEPENDENT_AMBULATORY_CARE_PROVIDER_SITE_OTHER): Payer: Medicare Other | Admitting: Cardiology

## 2015-10-16 VITALS — BP 122/80 | HR 50 | Ht 68.0 in | Wt 205.0 lb

## 2015-10-16 DIAGNOSIS — I2510 Atherosclerotic heart disease of native coronary artery without angina pectoris: Secondary | ICD-10-CM | POA: Diagnosis not present

## 2015-10-16 DIAGNOSIS — E785 Hyperlipidemia, unspecified: Secondary | ICD-10-CM | POA: Diagnosis not present

## 2015-10-16 DIAGNOSIS — Z79899 Other long term (current) drug therapy: Secondary | ICD-10-CM

## 2015-10-16 NOTE — Patient Instructions (Signed)
Your physician has requested that you have an exercise tolerance test. For further information please visit [email or web address]. Please also follow instruction sheet, as given.  Your physician recommends that you return for lab work fasting.   Your physician wants you to follow-up in: 6 months or sooner if needed with Dr Percival Spanish. You will receive a reminder letter in the mail two months in advance. If you don't receive a letter, please call our office to schedule the follow-up appointment.

## 2015-10-18 ENCOUNTER — Telehealth (HOSPITAL_COMMUNITY): Payer: Self-pay

## 2015-10-18 DIAGNOSIS — F331 Major depressive disorder, recurrent, moderate: Secondary | ICD-10-CM | POA: Diagnosis not present

## 2015-10-18 NOTE — Telephone Encounter (Signed)
Encounter complete. 

## 2015-10-19 NOTE — Addendum Note (Signed)
Addended by: Vennie Homans on: 10/19/2015 12:34 PM   Modules accepted: Orders

## 2015-10-22 DIAGNOSIS — Z79899 Other long term (current) drug therapy: Secondary | ICD-10-CM | POA: Diagnosis not present

## 2015-10-22 DIAGNOSIS — E785 Hyperlipidemia, unspecified: Secondary | ICD-10-CM | POA: Diagnosis not present

## 2015-10-23 ENCOUNTER — Ambulatory Visit (HOSPITAL_COMMUNITY)
Admission: RE | Admit: 2015-10-23 | Discharge: 2015-10-23 | Disposition: A | Discharge status: Home / Self Care | Payer: Medicare Other | Source: Ambulatory Visit | Attending: Cardiology | Admitting: Cardiology

## 2015-10-23 DIAGNOSIS — I2510 Atherosclerotic heart disease of native coronary artery without angina pectoris: Secondary | ICD-10-CM | POA: Insufficient documentation

## 2015-10-23 LAB — HEPATIC FUNCTION PANEL
ALBUMIN: 4.3 g/dL (ref 3.6–5.1)
ALK PHOS: 44 U/L (ref 33–130)
ALT: 33 U/L — AB (ref 6–29)
AST: 31 U/L (ref 10–35)
BILIRUBIN INDIRECT: 0.9 mg/dL (ref 0.2–1.2)
Bilirubin, Direct: 0.2 mg/dL (ref <=0.2)
TOTAL PROTEIN: 6.5 g/dL (ref 6.1–8.1)
Total Bilirubin: 1.1 mg/dL (ref 0.2–1.2)

## 2015-10-23 LAB — LIPID PANEL
CHOL/HDL RATIO: 2.9 ratio (ref <=5.0)
Cholesterol: 135 mg/dL (ref 125–200)
HDL: 47 mg/dL (ref >=46)
LDL Cholesterol: 67 mg/dL (ref <130)
Triglycerides: 105 mg/dL (ref <150)
VLDL: 21 mg/dL (ref <30)

## 2015-10-23 LAB — EXERCISE TOLERANCE TEST
CSEPED: 6 min
CSEPEDS: 1 s
CSEPHR: 63 %
CSEPPHR: 101 {beats}/min
Estimated workload: 7 METS
MPHR: 159 {beats}/min
RPE: 15
Rest HR: 60 {beats}/min

## 2015-10-28 ENCOUNTER — Other Ambulatory Visit: Payer: Self-pay | Admitting: Family Medicine

## 2015-10-29 ENCOUNTER — Telehealth: Payer: Self-pay | Admitting: *Deleted

## 2015-10-29 ENCOUNTER — Telehealth: Payer: Self-pay | Admitting: Cardiology

## 2015-10-29 DIAGNOSIS — I2510 Atherosclerotic heart disease of native coronary artery without angina pectoris: Secondary | ICD-10-CM

## 2015-10-29 NOTE — Telephone Encounter (Signed)
No recent/future appts., please advise  

## 2015-10-29 NOTE — Telephone Encounter (Signed)
Pt aware of her stress test and labs, lexiscan ordered and send to scheduler to be schedule

## 2015-10-29 NOTE — Telephone Encounter (Signed)
Follow up   Pt verbalized that she is returning call for [facility]

## 2015-10-29 NOTE — Telephone Encounter (Signed)
Lexiscan order and send to scheduling

## 2015-10-29 NOTE — Telephone Encounter (Signed)
Will refill electronically  

## 2015-10-29 NOTE — Telephone Encounter (Signed)
New Message  Pt voiced she is returning nurses call.  Please f/u with pt

## 2015-10-30 ENCOUNTER — Other Ambulatory Visit: Payer: Self-pay | Admitting: Family Medicine

## 2015-10-30 NOTE — Telephone Encounter (Signed)
No recent/future appts., please advise  

## 2015-10-30 NOTE — Telephone Encounter (Signed)
Will refill electronically  

## 2015-11-01 ENCOUNTER — Other Ambulatory Visit: Payer: Self-pay | Admitting: Family Medicine

## 2015-11-01 ENCOUNTER — Telehealth: Payer: Self-pay | Admitting: Cardiology

## 2015-11-01 NOTE — Telephone Encounter (Signed)
New message     10/5 pt verbalized that they are returning call for rn

## 2015-11-02 MED ORDER — HYDROCODONE-ACETAMINOPHEN 5-325 MG PO TABS: ORAL_TABLET | ORAL | 0 refills | Status: DC

## 2015-11-02 NOTE — Telephone Encounter (Signed)
Called patient and notified R/X ready for pick up.  Reviewed contract which is not scheduled for re-eval until 12/2015, currently does not need lab work.     R/X placed up front for pick up.

## 2015-11-02 NOTE — Telephone Encounter (Signed)
I am following progress with her shoulder  Px printed for pick up in IN box

## 2015-11-02 NOTE — Telephone Encounter (Signed)
See mychart message, last filled on 10/05/15 #120 with 0 refills, please advise

## 2015-11-06 ENCOUNTER — Other Ambulatory Visit: Payer: Self-pay | Admitting: Cardiology

## 2015-11-14 ENCOUNTER — Telehealth (HOSPITAL_COMMUNITY): Payer: Self-pay

## 2015-11-14 DIAGNOSIS — F331 Major depressive disorder, recurrent, moderate: Secondary | ICD-10-CM | POA: Diagnosis not present

## 2015-11-14 NOTE — Telephone Encounter (Signed)
Encounter complete. 

## 2015-11-15 ENCOUNTER — Ambulatory Visit (HOSPITAL_BASED_OUTPATIENT_CLINIC_OR_DEPARTMENT_OTHER): Payer: Medicare Other | Admitting: Hematology

## 2015-11-15 ENCOUNTER — Telehealth: Payer: Self-pay | Admitting: Hematology

## 2015-11-15 ENCOUNTER — Encounter: Payer: Self-pay | Admitting: Hematology

## 2015-11-15 ENCOUNTER — Other Ambulatory Visit (HOSPITAL_BASED_OUTPATIENT_CLINIC_OR_DEPARTMENT_OTHER): Payer: Medicare Other

## 2015-11-15 VITALS — BP 104/55 | HR 57 | Temp 97.5°F | Resp 18 | Ht 68.0 in | Wt 208.0 lb

## 2015-11-15 DIAGNOSIS — Z23 Encounter for immunization: Secondary | ICD-10-CM | POA: Diagnosis not present

## 2015-11-15 DIAGNOSIS — C50411 Malignant neoplasm of upper-outer quadrant of right female breast: Secondary | ICD-10-CM

## 2015-11-15 DIAGNOSIS — Z170 Estrogen receptor positive status [ER+]: Secondary | ICD-10-CM | POA: Diagnosis not present

## 2015-11-15 DIAGNOSIS — M79601 Pain in right arm: Secondary | ICD-10-CM

## 2015-11-15 DIAGNOSIS — M8580 Other specified disorders of bone density and structure, unspecified site: Secondary | ICD-10-CM

## 2015-11-15 LAB — COMPREHENSIVE METABOLIC PANEL
ALBUMIN: 3.8 g/dL (ref 3.5–5.0)
ALK PHOS: 56 U/L (ref 40–150)
ALT: 32 U/L (ref 0–55)
AST: 32 U/L (ref 5–34)
Anion Gap: 8 mEq/L (ref 3–11)
BUN: 16.9 mg/dL (ref 7.0–26.0)
CALCIUM: 9.7 mg/dL (ref 8.4–10.4)
CO2: 20 mEq/L — ABNORMAL LOW (ref 22–29)
CREATININE: 1.3 mg/dL — AB (ref 0.6–1.1)
Chloride: 113 mEq/L — ABNORMAL HIGH (ref 98–109)
EGFR: 46 mL/min/{1.73_m2} — ABNORMAL LOW (ref >90)
Glucose: 119 mg/dl (ref 70–140)
Potassium: 4.3 mEq/L (ref 3.5–5.1)
Sodium: 141 mEq/L (ref 136–145)
Total Bilirubin: 1.3 mg/dL — ABNORMAL HIGH (ref 0.20–1.20)
Total Protein: 7.1 g/dL (ref 6.4–8.3)

## 2015-11-15 LAB — CBC WITH DIFFERENTIAL/PLATELET
BASO%: 0.2 % (ref 0.0–2.0)
Basophils Absolute: 0 10*3/uL (ref 0.0–0.1)
EOS%: 2.8 % (ref 0.0–7.0)
Eosinophils Absolute: 0.1 10*3/uL (ref 0.0–0.5)
HEMATOCRIT: 37.5 % (ref 34.8–46.6)
HEMOGLOBIN: 13.2 g/dL (ref 11.6–15.9)
LYMPH#: 1.3 10*3/uL (ref 0.9–3.3)
LYMPH%: 25.7 % (ref 14.0–49.7)
MCH: 31.1 pg (ref 25.1–34.0)
MCHC: 35.2 g/dL (ref 31.5–36.0)
MCV: 88.4 fL (ref 79.5–101.0)
MONO#: 0.5 10*3/uL (ref 0.1–0.9)
MONO%: 8.8 % (ref 0.0–14.0)
NEUT#: 3.2 10*3/uL (ref 1.5–6.5)
NEUT%: 62.5 % (ref 38.4–76.8)
Platelets: 145 10*3/uL (ref 145–400)
RBC: 4.24 10*6/uL (ref 3.70–5.45)
RDW: 12.7 % (ref 11.2–14.5)
WBC: 5.1 10*3/uL (ref 3.9–10.3)

## 2015-11-15 MED ORDER — INFLUENZA VAC SPLIT QUAD 0.5 ML IM SUSY
0.5000 mL | PREFILLED_SYRINGE | Freq: Once | INTRAMUSCULAR | Status: AC
  Administered 2015-11-15: 0.5 mL via INTRAMUSCULAR
  Filled 2015-11-15: qty 0.5

## 2015-11-15 NOTE — Telephone Encounter (Signed)
GAVE PATIENT AVS REPORT AND APPOINTMENTS FOR April. °

## 2015-11-15 NOTE — Progress Notes (Signed)
[place]  Telephone:(336) [phone] Fax:(336) [phone]  Clinic Follow Up Note   Patient Care Team: Abner Greenspan, MD as PCP - General Minus Breeding, MD as Consulting Physician (Cardiology) Holley Bouche, NP as Nurse Practitioner (Nurse Practitioner) Erroll Luna, MD as Consulting Physician (General Surgery) Thea Silversmith, MD as Consulting Physician (Radiation Oncology) Truitt Merle, MD as Consulting Physician (Hematology) Susa Day, MD as Consulting Physician (Orthopedic Surgery) 11/15/2015  CHIEF COMPLAINTS Follow up stage IA right breast cancer, pT1bN0M0    Breast cancer of upper-outer quadrant of right female breast ([place])   12/29/2013 Imaging    Screening mammogram and [place]: an irregular shadowing hypoechoic mass right breast 9 o'clock location 4 cm from the nipple measuring 7 x 7 x 7 mm. No adenopathy       12/29/2013 Initial Diagnosis    Breast cancer of upper-outer quadrant of right female breast      12/29/2013 Initial Biopsy    Grade I-II IDC, ER+ (98%), PR+ (81%), HER2- (ratio 1.24), Ki67 15%       01/11/2014 Procedure    Genetic counseling/testing: Revealed 1 VUS in ATM gene, p.D1641H (c.4921G>C).  Otherwise, genetic testing negative.       02/14/2014 Surgery    Right lumpectomy with SLNB ([facility]): Grade 2 IDC spanning 0.9 cm with associated grade 2 DCIS.  Negative margins. 5 sentinel lymph nodes negative.  HER2 repeated and neg. (ratio 1.16).      02/14/2014 Pathologic Stage    pT1bpN0; Stage IA      02/14/2014 Oncotype testing    Recurrence score 21 (13% risk of distant recurrence); no adjuvant chemo offered.       03/22/2014 - 04/19/2014 Radiation Therapy    Adjuvant radiation Pablo Ledger); Right breast: Total dose 42.72 Gy over 21 fractions. Right breast boost: Total dose 10 Gy over 5 fractions.        Anti-estrogen oral therapy    Anti-estrogen therapy was recommended by Dr. Burr Medico; pt declines anti-estrogen treatment.        05/25/2014 Survivorship    Survivorship Care Plan given to patient and reviewed with her in-person.         HISTORY OF PRESENTING ILLNESS (01/04/2014):  [name] [name] 60 y.o. female presents to our breast multidisciplinary clinic today because of newly diagnosed breast cancer.   This was discovered by routine screening mammogram a few weeks ago. She underwent diagnostic mammogram and ultrasound on 12/29/2013 which showed an irregular shadowing hypoechoic mass in right breast likely position measuring 7 mm. No adenopathy. She underwent core needle biopsy on the same day, which showed grade 1-2 invasive ductal adenocarcinoma, ER positive PR positive and HER-2 negative, Ki-67 10%.  She has been feeling very anxious since the biopsy. She does have history of coronary artery disease, status post stent placement in [facility] 7. Since the breast cancer diagnosis, she has had several episodes of anxiety attack, hypoventilation, with chest tightness. She appeared quite nervous when I met her first in the clinic. Before the biopsy, she felt well in general, she does have intermittent migraine headaches, chronic leg swollen, intermittent chest pain, denies any other new symptoms, no weight loss or other changes.  She lives with her only son, who is 75 years old, on disability due to depression and suicidal ideas. She is his caregiver.  CURRENT THERAPY: Surveillance  INTERIM HISTORY: She returns for follow up. She is doing well overall. She has seen orthopedic surgeon Dr. Berenice Primas for her right shoulder problem,  and surgery was recommended, she plans to get it done in the next few months. She otherwise doing well, denies any other new pain, or other symptoms.  MEDICAL HISTORY:  Past Medical History  Diagnosis Date  . Depression   . Anorexia   . Migraine   . GERD (gastroesophageal reflux disease)   . Seasonal allergies   . Arrhythmia   . CAD (coronary artery disease), s/p stent placement  2007   .  Frequent UTI   . Edema   . Vitamin D deficiency   . Restless leg syndrome   . Microscopic hematuria   . Hypothyroid   She has psychologist Dr. Rica Mote   SURGICAL HISTORY: Past Surgical History  Procedure Laterality Date  . Cholecystectomy open    . Appendectomy    . Abdominal hysterectomy  1983  . Back surgery  1997    herniated disc repair  . Cystoscopy      neg  Back surgery in 1997   SOCIAL HISTORY: History   Social History  . Marital Status: Widowed, husband died of colon cancer in 98     Spouse Name: N/A    Number of Children: One son  28 yo, on disability for depression, who lives with her   . Years of Education: N/A   Occupational History  . Secretary, Clinical cytogeneticist, retired     Social History Main Topics  . Smoking status: Former Smoker    Start date: 10/14/1971  . Smokeless tobacco: Never Used  . Alcohol Use: Yes     Comment: socially  . Drug Use: No  . Sexual Activity: Not on file   Other Topics Concern  . Not on file   Social History Narrative   Does not get any regular exercise      Doctors   -uro-[place]ttlein   -cardio--hochrein   -Neurology--Dr. Jannifer Franklin   -allergies--Dr. Carmelina Peal   Gyn past--Dr. Warnell Forester   Couselor--Dr. Mitchum   Endo-- Dr. Chalmers Cater      Lives with son who is 69 years old    FAMILY HISTORY: Family History  Problem Relation Age of Onset  . Coronary artery disease Mother   . Thyroid disease Mother   . Alzheimer's disease Mother   . Diabetes Mother   . Heart disease Mother   . Cancer Brother     melnoma  . Ovarian cancer Paternal Aunt 22  . Bone cancer Paternal Uncle   . Alzheimer's disease Maternal Grandmother   . Heart disease Maternal Grandfather   . Pancreatic cancer Maternal Grandfather 26  . Ovarian cancer Maternal Aunt     dx in 72s  . Throat cancer Maternal Uncle     smoker  . Lung cancer Maternal Uncle     heavy smoker  . Ovarian cancer Paternal Aunt     dx in her 33s  . Cancer Cousin     "female cancer"  .  Cancer Father    GYN HISTORY  Menarchal: 10 LMP: 1983 hysrectomy  Contraceptive: 2 years  HRT: was on for 10-20 years, off several years  GxP1: several miscarriages     ALLERGIES:  is allergic to iohexol.  MEDICATIONS:    Medication List       Accurate as of 11/15/15 10:48 PM. Always use your most recent med list.          aspirin 81 MG tablet Take 81 mg by mouth daily.   cephALEXin 250 MG capsule Commonly known as:  KEFLEX Take 250  mg by mouth at bedtime.   CINNAMON PO Take 1,000 mg by mouth daily.   Co Q-10 200 MG Caps Take 1 capsule by mouth daily.   fish oil-omega-3 fatty acids 1000 MG capsule Take 1.2 g by mouth 2 (two) times daily.   furosemide 20 MG tablet Commonly known as:  LASIX TAKE 1 TABLET (20 MG TOTAL) BY MOUTH DAILY.   gabapentin 300 MG capsule Commonly known as:  NEURONTIN Take 1 capsule (300 mg total) by mouth 2 (two) times daily.   Ginkgo Biloba 120 MG Caps Take 1 capsule by mouth daily.   HYDROcodone-acetaminophen 5-325 MG tablet Commonly known as:  NORCO Take one pill by mouth up to 4 times daily as needed for pain   KLOR-CON M20 20 MEQ tablet Generic drug:  potassium chloride SA TAKE 2 TABLETS BY MOUTH TWICE A DAY AND 1/2 TABLET AT BEDTIME   levothyroxine 137 MCG tablet Commonly known as:  SYNTHROID, LEVOTHROID Take 137 mcg by mouth daily before breakfast.   Magnesium 250 MG Tabs Take 250 mg by mouth daily.   multivitamin capsule Take 1 capsule by mouth daily.   nitroGLYCERIN 0.4 MG SL tablet Commonly known as:  NITROSTAT Place 1 tablet (0.4 mg total) under the tongue every 5 (five) minutes as needed. Pain   pantoprazole 40 MG tablet Commonly known as:  PROTONIX TAKE 1 TABLET (40 MG TOTAL) BY MOUTH 2 (TWO) TIMES DAILY.   PAZEO 0.7 % Soln Generic drug:  Olopatadine HCl Apply 1 drop to eye daily. 1 drop each eye daily.   propranolol ER 60 MG 24 hr capsule Commonly known as:  INDERAL LA TAKE 1 CAPSULE (60 MG TOTAL) BY  MOUTH AT BEDTIME.   ranitidine 300 MG tablet Commonly known as:  ZANTAC TAKE 1 TABLET (300 MG TOTAL) BY MOUTH 2 (TWO) TIMES DAILY.   Riboflavin 400 MG Tabs Take 400 mg by mouth daily.   simvastatin 40 MG tablet Commonly known as:  ZOCOR TAKE 1 TABLET BY MOUTH AT BEDTIME   topiramate 50 MG tablet Commonly known as:  TOPAMAX TAKE ONE TABLET IN THE MORNING AND 2 IN THE EVENING        REVIEW OF SYSTEMS:   Constitutional: Denies fevers, chills or abnormal night sweats Eyes: Denies blurriness of vision, double vision or watery eyes Ears, nose, mouth, throat, and face: Denies mucositis or sore throat Respiratory: Denies cough, dyspnea or wheezes Cardiovascular: (+) palpitation and intermittent chest pain, (+) lower extremity swelling Gastrointestinal:  Denies nausea, heartburn or change in bowel habits Skin: Denies abnormal skin rashes Lymphatics: Denies new lymphadenopathy or easy bruising Neurological:Denies numbness, tingling or new weaknesses, (+) right upper arm pain  Behavioral/Psych: (+) very anxious All other systems were reviewed with the patient and are negative.  PHYSICAL EXAMINATION: ECOG PERFORMANCE STATUS: 1  Vitals:   11/15/15 1548  BP: (!) 104/55  Pulse: (!) 57  Resp: 18  Temp: 97.5 F (36.4 C)   Filed Weights   11/15/15 1548  Weight: 208 lb (94.3 kg)    GENERAL:alert, no distress and comfortable SKIN: skin color, texture, turgor are normal, no rashes or significant lesions EYES: normal, conjunctiva are pink and non-injected, sclera clear OROPHARYNX:no exudate, no erythema and lips, buccal mucosa, and tongue normal  NECK: supple, thyroid normal size, non-tender, without nodularity LYMPH:  no palpable lymphadenopathy in the cervical, axillary or inguinal LUNGS: clear to auscultation and percussion with normal breathing effort HEART: regular rate & rhythm and no murmurs and no  lower extremity edema ABDOMEN:abdomen soft, non-tender and normal bowel  sounds Musculoskeletal:no cyanosis of digits and no clubbing, no significant edema on all her extremities, no tenderness of the right shoulder or arm, the range of right shoulder is normal. PSYCH: alert & oriented x 3 with fluent speech NEURO: no focal motor/sensory deficits Breasts: Breast inspection showed them to be symmetrical with no nipple discharge. Palpation of the breasts and axilla revealed no obvious mass that I could appreciate. Surgical scar in the right breast is well-healed with some scar tissue underneath.    LABORATORY DATA:  I have reviewed the data as listed Lab Results  Component Value Date   WBC 5.1 11/15/2015   HGB 13.2 11/15/2015   HCT 37.5 11/15/2015   MCV 88.4 11/15/2015   PLT 145 11/15/2015    Recent Labs  05/10/15 1412 10/22/15 1145 11/15/15 1535  NA 143  --  141  K 4.2  --  4.3  CO2 22  --  20*  GLUCOSE 122  --  119  BUN 22.3  --  16.9  CREATININE 1.4*  --  1.3*  CALCIUM 9.6  --  9.7  PROT 6.7 6.5 7.1  ALBUMIN 3.7 4.3 3.8  AST 26 31 32  ALT 30 33* 32  ALKPHOS 53 44 56  BILITOT 0.83 1.1 1.30*  BILIDIR  --  0.2  --   IBILI  --  0.9  --    PATHOLOGY REPORT 12/30/2013 Diagnosis 1. Breast, lumpectomy, Right 02/14/2014 - INVASIVE GRADE II DUCTAL CARCINOMA SPANNING 0.9 CM IN GREATEST DIMENSION. - ASSOCIATED INTERMEDIATE GRADE DUCTAL CARCINOMA IN SITU. - MARGINS ARE NEGATIVE. - SEE ONCOLOGY TEMPLATE. 2. Breast, excision, Right - BENIGN BREAST PARENCHYMA WITH SCATTERED ACUTE AND CHRONIC INFLAMMATION. - NO ATYPIA, HYPERPLASIA, OR MALIGNANCY IDENTIFIED. 3. Lymph node, sentinel, biopsy, Right axillary - ONE BENIGN LYMPH NODE WITH NO TUMOR SEEN (0/1). 4. Lymph node, sentinel, biopsy, Right axillary - ONE BENIGN LYMPH NODE WITH NO TUMOR SEEN (0/1). 5. Lymph node, sentinel, biopsy, Right axillary - ONE BENIGN LYMPH NODE WITH NO TUMOR SEEN (0/1). 6. Lymph node, sentinel, biopsy, Right axillary - ONE BENIGN LYMPH NODE WITH NO TUMOR SEEN (0/1). 7.  Lymph node, sentinel, biopsy, Right axillary - ONE BENIGN LYMPH NODE WITH NO TUMOR SEEN (0/1). 1. BREAST, INVASIVE TUMOR, WITH LYMPH NODES PRESENT Specimen, including laterality and lymph node sampling (sentinel, non-sentinel): Right partial breast with right axillary sentinel lymph node sampling. Procedure: Right breast lumpectomy with right axillary sentinel lymph node mapping and biopsy. Histologic type: Invasive ductal carcinoma. Grade: II. Tubule formation: 3. Nuclear pleomorphism: 2. Mitotic:1. Tumor size (gross measurement and microscopic measurement): 0.9 cm. 1 of 4 FINAL for NISSA, STANNARD ([id number]) Microscopic Comment(continued) Margins: Invasive, distance to closest margin: 0.4 cm (medial margin). In-situ, distance to closest margin: At least 0.4 cm (medial margin). Lymphovascular invasion: Definitive lymph/vascular invasion is not identified. Ductal carcinoma in situ: Yes, present. Grade: Intermediate grade. Extensive intraductal component: No. Lobular neoplasia: Not identified. Tumor focality: Unifocal. Treatment effect: Not applicable. Extent of tumor: Tumor confined to breast parenchyma. Skin: Not received. Nipple: Not received. Skeletal muscle: Not received. Lymph nodes: Examined: 5 Sentinel. 5 Non-sentinel. 5 Total. Lymph nodes with metastasis: 0. Isolated tumor cells (< 0.2 mm): 0. Micrometastasis: (> 0.2 mm and < 2.0 mm): 0. Macrometastasis: (> 2.0 mm): 0. Extracapsular extension: Not applicable. Breast prognostic profile: Performed on previous biopsy ([id number]) Estrogen receptor: 98%, positive. Progesterone receptor: 81%, positive. Her 2 neu by C[facility]: 1.24 ratio, not  amplified. Ki-67: 15% Non-neoplastic breast: Fat necrosis. TNM: pT1b, pN0.  ONCOTYPE DX RS score: 21    RADIOGRAPHIC STUDIES: I have personally reviewed the radiological images as listed and agreed with the findings in the report.  Mm DIAG Breast Tomo Bilateral  12/12/2014 IMPRESSION: No mammographic evidence of malignancy in either breast.  RECOMMENDATION: Screening mammogram in one year.(Code:[id number]-B-01Y)   ASSESSMENT & PLAN:  60 year old postmenopausal woman, with past medical history of coronary disease, anxiety and depression, who was found by screening mammogram to have a T1b N0 M0 stage I a invasive ductal carcinoma, grade I-II, ER/PR strongly positive, HER-2 negative, Ki-67 10%.  1.  pT1b N0 M0 stage IA invasive ductal carcinoma of right breast, grade II, ER/PR strongly positive, HER-2 negative, Oncotype RS 21 -She is status post lumpectomy, and adjuvant breast radiation. -She declined adjuvant endocrine therapy, due to the concern of side effects. -We will continue the surveillance plan. She will see me every 6 months for the first few years, then annually afterwards. She'll continue annual screening mammogram, next due in a month.  -She is clinically doing well, exam was unremarkable today. Lab reviewed with her. No evidence of recurrence. -I encouraged her to have healthy diet and exercise regularly.  2. Osteopenia -She will continue calcium and vitamin D supplement.  3. Genetics -Given her strong family history of ovarian cancer, she was seen by genetic counselor -Negative BRCAPlus testing from [employer].  Genes tested include: BRCA1, BRCA2, CDH1, PTEN, PALB2, and TP53.  Report date is 01/25/14.   BRIP1 c.4921G>C VUS found on the CancerNext panel.  The CancerNext gene panel offered by [employer] and includes sequencing and rearrangement analysis for the following 32 genes:   APC, ATM, BARD1, BMPR1A, BRCA1, BRCA2, BRIP1, CDH1, CDK4, CDKN2A, CHEK2, EPCAM, GREM1, MLH1, MRE11A, MSH2, MSH6, MUTYH, NBN, NF1, PALB2, PMS2, POLD1, POLE, PTEN, RAD50, RAD51D, SMAD4, SMARCA4, STK11, and TP53.  The report date is: February 13, 2014.  4. Right upper arm pain -her x-ray of right humerus in April 2016 showed a 1.3 cm sclerotic lesion, possible  enchondroma. -she will follow up with orthopedic surgeon Dr. Berenice Primas, she is planning to have her right shoulder surgery in a few months  5. Mild hyperbilirubinemia  -She has had mild intermittent elevated total bilirubin, liver enzymes are normal -We'll continue follow.   6. She'll continue follow-up with her primary care physician and cardiologist for her other medical problems.  Plan - I'll see her back in 6 month for lab and physical exam. Diagnostic Mammogram in 11/2015 -She is going to have right shoulder surgery in a few months. -flu shot today   All questions were answered. The patient knows to call the clinic with any problems, questions or concerns.  I spent 20 minutes counseling the patient face to face. The total time spent in the appointment was 30 minutes and more than 50% was on counseling.     Truitt Merle, MD 11/15/2015

## 2015-11-16 ENCOUNTER — Ambulatory Visit (HOSPITAL_COMMUNITY)
Admission: RE | Admit: 2015-11-16 | Discharge: 2015-11-16 | Disposition: A | Discharge status: Home / Self Care | Payer: Medicare Other | Source: Ambulatory Visit | Attending: Cardiology | Admitting: Cardiology

## 2015-11-16 DIAGNOSIS — I2510 Atherosclerotic heart disease of native coronary artery without angina pectoris: Secondary | ICD-10-CM

## 2015-11-16 LAB — MYOCARDIAL PERFUSION IMAGING
CHL CUP NUCLEAR SDS: 1
CHL CUP NUCLEAR SRS: 0
CHL CUP RESTING HR STRESS: 52 {beats}/min
LV dias vol: 75 mL (ref 46–106)
LVSYSVOL: 30 mL
NUC STRESS TID: 1.66
Peak HR: 86 {beats}/min
SSS: 1

## 2015-11-16 MED ORDER — AMINOPHYLLINE 25 MG/ML IV SOLN
75.0000 mg | Freq: Once | INTRAVENOUS | Status: AC
  Administered 2015-11-16: 75 mg via INTRAVENOUS

## 2015-11-16 MED ORDER — TECHNETIUM TC 99M TETROFOSMIN IV KIT
10.3000 | PACK | Freq: Once | INTRAVENOUS | Status: AC | PRN
  Administered 2015-11-16: 10.3 via INTRAVENOUS
  Filled 2015-11-16: qty 11

## 2015-11-16 MED ORDER — TECHNETIUM TC 99M TETROFOSMIN IV KIT
31.1000 | PACK | Freq: Once | INTRAVENOUS | Status: AC | PRN
  Administered 2015-11-16: 31.1 via INTRAVENOUS
  Filled 2015-11-16: qty 32

## 2015-11-16 MED ORDER — REGADENOSON 0.4 MG/5ML IV SOLN
0.4000 mg | Freq: Once | INTRAVENOUS | Status: AC
  Administered 2015-11-16: 0.4 mg via INTRAVENOUS

## 2015-12-05 ENCOUNTER — Other Ambulatory Visit: Payer: Self-pay | Admitting: Family Medicine

## 2015-12-05 NOTE — Telephone Encounter (Signed)
No recent/future appts. Please advise

## 2015-12-05 NOTE — Telephone Encounter (Signed)
Please schedule 30 min f/u for spring and refill until then thanks

## 2015-12-06 NOTE — Telephone Encounter (Signed)
Left voicemail requesting pt to call office back and schedule a f/u in the Spring, med refilled

## 2015-12-07 ENCOUNTER — Other Ambulatory Visit: Payer: Self-pay | Admitting: Family Medicine

## 2015-12-07 MED ORDER — HYDROCODONE-ACETAMINOPHEN 5-325 MG PO TABS: ORAL_TABLET | ORAL | 0 refills | Status: DC

## 2015-12-07 NOTE — Telephone Encounter (Signed)
Left voicemail letting pt know Rx ready for pick up 

## 2015-12-07 NOTE — Telephone Encounter (Signed)
Printed.  Thanks.  

## 2015-12-07 NOTE — Telephone Encounter (Signed)
Dr Glori Bickers out of the office today, please advise

## 2015-12-10 DIAGNOSIS — F331 Major depressive disorder, recurrent, moderate: Secondary | ICD-10-CM | POA: Diagnosis not present

## 2015-12-13 ENCOUNTER — Ambulatory Visit
Admission: RE | Admit: 2015-12-13 | Discharge: 2015-12-13 | Disposition: A | Discharge status: Home / Self Care | Payer: Medicare Other | Source: Ambulatory Visit | Attending: Hematology | Admitting: Hematology

## 2015-12-13 DIAGNOSIS — C50411 Malignant neoplasm of upper-outer quadrant of right female breast: Secondary | ICD-10-CM

## 2015-12-13 DIAGNOSIS — R928 Other abnormal and inconclusive findings on diagnostic imaging of breast: Secondary | ICD-10-CM | POA: Diagnosis not present

## 2015-12-13 DIAGNOSIS — Z170 Estrogen receptor positive status [ER+]: Principal | ICD-10-CM

## 2015-12-14 ENCOUNTER — Telehealth: Payer: Self-pay | Admitting: Cardiology

## 2015-12-14 NOTE — Telephone Encounter (Signed)
She needs to ask her surgeon if she really needs to stop ASA.  Typically they would not need to hold.

## 2015-12-14 NOTE — Telephone Encounter (Signed)
New message  Scheduled for surgery/next/on shoulder  Needs to know when to stop baby Asprin

## 2015-12-17 NOTE — Telephone Encounter (Signed)
Requesting surgical clearance:   1. Type of surgery: Right -Shoulder Arthroscopy w- rotator cuff repair  2. Surgeon: Dr Dorna Leitz  3. Surgical date: 12/19/2015  4. Medications that need to be help: Aspirin  5. [facility]: Mamie Nick) [phone] (F) [phone]  Is pt cleared for surgery?

## 2015-12-17 NOTE — Telephone Encounter (Signed)
Stress test result clearing pt for surgery was faxed to [facility] via epic

## 2015-12-17 NOTE — Telephone Encounter (Signed)
New message ° ° ° °Pt verbalized that she is returning call for rn  °

## 2015-12-17 NOTE — Telephone Encounter (Signed)
Please see the results and clearance on the stress result.

## 2015-12-19 DIAGNOSIS — M7541 Impingement syndrome of right shoulder: Secondary | ICD-10-CM | POA: Diagnosis not present

## 2015-12-19 DIAGNOSIS — M19011 Primary osteoarthritis, right shoulder: Secondary | ICD-10-CM | POA: Diagnosis not present

## 2015-12-19 DIAGNOSIS — G8918 Other acute postprocedural pain: Secondary | ICD-10-CM | POA: Diagnosis not present

## 2015-12-19 DIAGNOSIS — M75121 Complete rotator cuff tear or rupture of right shoulder, not specified as traumatic: Secondary | ICD-10-CM | POA: Diagnosis not present

## 2015-12-19 DIAGNOSIS — M7521 Bicipital tendinitis, right shoulder: Secondary | ICD-10-CM | POA: Diagnosis not present

## 2015-12-19 DIAGNOSIS — S43431A Superior glenoid labrum lesion of right shoulder, initial encounter: Secondary | ICD-10-CM | POA: Diagnosis not present

## 2016-01-01 DIAGNOSIS — M19011 Primary osteoarthritis, right shoulder: Secondary | ICD-10-CM | POA: Diagnosis not present

## 2016-01-03 DIAGNOSIS — E890 Postprocedural hypothyroidism: Secondary | ICD-10-CM | POA: Diagnosis not present

## 2016-01-07 DIAGNOSIS — M75121 Complete rotator cuff tear or rupture of right shoulder, not specified as traumatic: Secondary | ICD-10-CM | POA: Diagnosis not present

## 2016-01-07 DIAGNOSIS — M25511 Pain in right shoulder: Secondary | ICD-10-CM | POA: Diagnosis not present

## 2016-01-07 DIAGNOSIS — M25611 Stiffness of right shoulder, not elsewhere classified: Secondary | ICD-10-CM | POA: Diagnosis not present

## 2016-01-10 DIAGNOSIS — M25511 Pain in right shoulder: Secondary | ICD-10-CM | POA: Diagnosis not present

## 2016-01-10 DIAGNOSIS — M25611 Stiffness of right shoulder, not elsewhere classified: Secondary | ICD-10-CM | POA: Diagnosis not present

## 2016-01-10 DIAGNOSIS — M75121 Complete rotator cuff tear or rupture of right shoulder, not specified as traumatic: Secondary | ICD-10-CM | POA: Diagnosis not present

## 2016-01-14 DIAGNOSIS — M25611 Stiffness of right shoulder, not elsewhere classified: Secondary | ICD-10-CM | POA: Diagnosis not present

## 2016-01-14 DIAGNOSIS — M25511 Pain in right shoulder: Secondary | ICD-10-CM | POA: Diagnosis not present

## 2016-01-14 DIAGNOSIS — M75121 Complete rotator cuff tear or rupture of right shoulder, not specified as traumatic: Secondary | ICD-10-CM | POA: Diagnosis not present

## 2016-01-16 DIAGNOSIS — M25511 Pain in right shoulder: Secondary | ICD-10-CM | POA: Diagnosis not present

## 2016-01-16 DIAGNOSIS — M25611 Stiffness of right shoulder, not elsewhere classified: Secondary | ICD-10-CM | POA: Diagnosis not present

## 2016-01-16 DIAGNOSIS — M75121 Complete rotator cuff tear or rupture of right shoulder, not specified as traumatic: Secondary | ICD-10-CM | POA: Diagnosis not present

## 2016-01-17 DIAGNOSIS — Z9889 Other specified postprocedural states: Secondary | ICD-10-CM | POA: Diagnosis not present

## 2016-01-23 DIAGNOSIS — M75121 Complete rotator cuff tear or rupture of right shoulder, not specified as traumatic: Secondary | ICD-10-CM | POA: Diagnosis not present

## 2016-01-23 DIAGNOSIS — M25611 Stiffness of right shoulder, not elsewhere classified: Secondary | ICD-10-CM | POA: Diagnosis not present

## 2016-01-23 DIAGNOSIS — M25511 Pain in right shoulder: Secondary | ICD-10-CM | POA: Diagnosis not present

## 2016-01-25 DIAGNOSIS — M25611 Stiffness of right shoulder, not elsewhere classified: Secondary | ICD-10-CM | POA: Diagnosis not present

## 2016-01-25 DIAGNOSIS — M25511 Pain in right shoulder: Secondary | ICD-10-CM | POA: Diagnosis not present

## 2016-01-25 DIAGNOSIS — M75121 Complete rotator cuff tear or rupture of right shoulder, not specified as traumatic: Secondary | ICD-10-CM | POA: Diagnosis not present

## 2016-01-31 DIAGNOSIS — M25511 Pain in right shoulder: Secondary | ICD-10-CM | POA: Diagnosis not present

## 2016-01-31 DIAGNOSIS — M25611 Stiffness of right shoulder, not elsewhere classified: Secondary | ICD-10-CM | POA: Diagnosis not present

## 2016-01-31 DIAGNOSIS — M75121 Complete rotator cuff tear or rupture of right shoulder, not specified as traumatic: Secondary | ICD-10-CM | POA: Diagnosis not present

## 2016-02-04 DIAGNOSIS — M25611 Stiffness of right shoulder, not elsewhere classified: Secondary | ICD-10-CM | POA: Diagnosis not present

## 2016-02-04 DIAGNOSIS — M75121 Complete rotator cuff tear or rupture of right shoulder, not specified as traumatic: Secondary | ICD-10-CM | POA: Diagnosis not present

## 2016-02-04 DIAGNOSIS — M25511 Pain in right shoulder: Secondary | ICD-10-CM | POA: Diagnosis not present

## 2016-02-06 DIAGNOSIS — M25511 Pain in right shoulder: Secondary | ICD-10-CM | POA: Diagnosis not present

## 2016-02-06 DIAGNOSIS — M25611 Stiffness of right shoulder, not elsewhere classified: Secondary | ICD-10-CM | POA: Diagnosis not present

## 2016-02-06 DIAGNOSIS — M75121 Complete rotator cuff tear or rupture of right shoulder, not specified as traumatic: Secondary | ICD-10-CM | POA: Diagnosis not present

## 2016-02-11 DIAGNOSIS — M25611 Stiffness of right shoulder, not elsewhere classified: Secondary | ICD-10-CM | POA: Diagnosis not present

## 2016-02-11 DIAGNOSIS — M25511 Pain in right shoulder: Secondary | ICD-10-CM | POA: Diagnosis not present

## 2016-02-11 DIAGNOSIS — M75121 Complete rotator cuff tear or rupture of right shoulder, not specified as traumatic: Secondary | ICD-10-CM | POA: Diagnosis not present

## 2016-02-12 DIAGNOSIS — M25511 Pain in right shoulder: Secondary | ICD-10-CM | POA: Diagnosis not present

## 2016-02-18 DIAGNOSIS — M25611 Stiffness of right shoulder, not elsewhere classified: Secondary | ICD-10-CM | POA: Diagnosis not present

## 2016-02-18 DIAGNOSIS — M75121 Complete rotator cuff tear or rupture of right shoulder, not specified as traumatic: Secondary | ICD-10-CM | POA: Diagnosis not present

## 2016-02-18 DIAGNOSIS — M25511 Pain in right shoulder: Secondary | ICD-10-CM | POA: Diagnosis not present

## 2016-02-20 DIAGNOSIS — M25611 Stiffness of right shoulder, not elsewhere classified: Secondary | ICD-10-CM | POA: Diagnosis not present

## 2016-02-20 DIAGNOSIS — M75121 Complete rotator cuff tear or rupture of right shoulder, not specified as traumatic: Secondary | ICD-10-CM | POA: Diagnosis not present

## 2016-02-20 DIAGNOSIS — M25511 Pain in right shoulder: Secondary | ICD-10-CM | POA: Diagnosis not present

## 2016-02-25 DIAGNOSIS — M75121 Complete rotator cuff tear or rupture of right shoulder, not specified as traumatic: Secondary | ICD-10-CM | POA: Diagnosis not present

## 2016-02-25 DIAGNOSIS — M25511 Pain in right shoulder: Secondary | ICD-10-CM | POA: Diagnosis not present

## 2016-02-25 DIAGNOSIS — M25611 Stiffness of right shoulder, not elsewhere classified: Secondary | ICD-10-CM | POA: Diagnosis not present

## 2016-02-28 DIAGNOSIS — M25611 Stiffness of right shoulder, not elsewhere classified: Secondary | ICD-10-CM | POA: Diagnosis not present

## 2016-02-28 DIAGNOSIS — M75121 Complete rotator cuff tear or rupture of right shoulder, not specified as traumatic: Secondary | ICD-10-CM | POA: Diagnosis not present

## 2016-02-28 DIAGNOSIS — M25511 Pain in right shoulder: Secondary | ICD-10-CM | POA: Diagnosis not present

## 2016-02-29 ENCOUNTER — Other Ambulatory Visit: Payer: Self-pay | Admitting: Cardiology

## 2016-03-03 ENCOUNTER — Ambulatory Visit (INDEPENDENT_AMBULATORY_CARE_PROVIDER_SITE_OTHER): Payer: Medicare Other | Admitting: Neurology

## 2016-03-03 ENCOUNTER — Encounter: Payer: Self-pay | Admitting: Neurology

## 2016-03-03 VITALS — BP 129/62 | HR 51 | Ht 68.0 in | Wt 206.5 lb

## 2016-03-03 DIAGNOSIS — G43719 Chronic migraine without aura, intractable, without status migrainosus: Secondary | ICD-10-CM | POA: Diagnosis not present

## 2016-03-03 MED ORDER — GABAPENTIN 300 MG PO CAPS: ORAL_CAPSULE | ORAL | 1 refills | Status: DC

## 2016-03-03 NOTE — Telephone Encounter (Signed)
Rx(s) sent to pharmacy electronically.  

## 2016-03-03 NOTE — Progress Notes (Signed)
Reason for visit: Headache  [name] [name] is an 61 y.o. female  History of present illness:  [name] is a 61 year old right-handed white female with a history of frequent headaches. The patient has just had some surgery on her right shoulder for a rotator cuff tear, she is on scheduled dosing of Percocet taking 4 tablets daily. The patient has had some improvement in her headache frequency, she was having daily headaches and she is now having 2 or 3 headaches a week. She may have one severe headache a week. She is on Topamax and gabapentin. She seems to be tolerating these medications fairly well. She remains in physical therapy for her right shoulder. She is not sleeping well at night in part secondary to pain. She returns to this office for an evaluation. Her headaches are on the left side of the head and are going into the frontotemporal area and around the left eye.  Past Medical History:  Diagnosis Date  . Anorexia   . Anxiety   . Arrhythmia   . Breast cancer ([place])    ER+/PR+/Her2-  . Concussion 05/26/14  . Depression   . Frequent UTI   . GERD (gastroesophageal reflux disease)   . Hypothyroid   . Microscopic hematuria   . Migraine   . Radiation 03/22/14-04/19/14   Breast Cancer upper-outer  . Radiation 03/2014   right breast  . Restless leg syndrome   . Seasonal allergies   . Stented coronary artery    LAD 95% 2007.  . Vitamin D deficiency   . Wears glasses     Past Surgical History:  Procedure Laterality Date  . ABDOMINAL HYSTERECTOMY    . APPENDECTOMY    . back surgery  1997   herniated disc repair  . CARDIAC CATHETERIZATION  2007   stent  . CHOLECYSTECTOMY OPEN    . CYSTOSCOPY     neg  . RADIOACTIVE SEED GUIDED MASTECTOMY WITH AXILLARY SENTINEL LYMPH NODE BIOPSY Right 02/14/2014   Procedure: RADIOACTIVE SEED GUIDED PARTIAL MASTECTOMY WITH AXILLARY SENTINEL LYMPH NODE BIOPSY;  Surgeon: Erroll Luna, MD;  Location: [facility];  Service:  General;  Laterality: Right;  . SHOULDER SURGERY      Family History  Problem Relation Age of Onset  . Coronary artery disease Mother   . Thyroid disease Mother   . Alzheimer's disease Mother   . Diabetes Mother   . Heart disease Mother   . Cancer Brother     melnoma  . Ovarian cancer Paternal Aunt 65  . Bone cancer Paternal Uncle   . Alzheimer's disease Maternal Grandmother   . Heart disease Maternal Grandfather   . Pancreatic cancer Maternal Grandfather 5  . Ovarian cancer Maternal Aunt     dx in 55s  . Throat cancer Maternal Uncle     smoker  . Lung cancer Maternal Uncle     heavy smoker  . Ovarian cancer Paternal Aunt     dx in her 22s  . Cancer Cousin     "female cancer"  . Cancer Father     Social history:  reports that she has never smoked. She has never used smokeless tobacco. She reports that she does not drink alcohol or use drugs.    Allergies  Allergen Reactions  . Iohexol      Desc: throat selling resp distress hives.premedicate pt.entered 07/06/04, bsw.     Medications:  Prior to Admission medications   Medication Sig Start Date End  Date Taking? Authorizing Provider  aspirin 81 MG tablet Take 81 mg by mouth daily.   Yes Historical Provider, MD  cephALEXin (KEFLEX) 250 MG capsule Take 250 mg by mouth at bedtime.  08/11/15  Yes Historical Provider, MD  CINNAMON PO Take 1,000 mg by mouth daily.    Yes Historical Provider, MD  Coenzyme Q10 (CO Q-10) 200 MG CAPS Take 1 capsule by mouth daily.     Yes Historical Provider, MD  fish oil-omega-3 fatty acids 1000 MG capsule Take 1.2 g by mouth 2 (two) times daily.   Yes Historical Provider, MD  furosemide (LASIX) 20 MG tablet TAKE 1 TABLET (20 MG TOTAL) BY MOUTH DAILY. 10/30/15  Yes Abner Greenspan, MD  gabapentin (NEURONTIN) 300 MG capsule One capsule in the morning and 2 in the evening 03/03/16  Yes Kathrynn Ducking, MD  Ginkgo Biloba 120 MG CAPS Take 1 capsule by mouth daily.     Yes Historical Provider, MD    KLOR-CON M20 20 MEQ tablet TAKE 2 TABLETS BY MOUTH TWICE A DAY AND 1/2 TABLET AT BEDTIME 10/29/15  Yes Abner Greenspan, MD  levothyroxine (SYNTHROID, LEVOTHROID) 137 MCG tablet Take 137 mcg by mouth daily before breakfast.  08/21/15  Yes Historical Provider, MD  Magnesium 250 MG TABS Take 250 mg by mouth daily.   Yes Historical Provider, MD  Multiple Vitamin (MULTIVITAMIN) capsule Take 1 capsule by mouth daily.   Yes Historical Provider, MD  nitroGLYCERIN (NITROSTAT) 0.4 MG SL tablet Place 1 tablet (0.4 mg total) under the tongue every 5 (five) minutes as needed. Pain 03/28/14  Yes Minus Breeding, MD  oxyCODONE-acetaminophen (PERCOCET/ROXICET) 5-325 MG tablet Take 1 tablet by mouth 4 (four) times daily. 02/20/16  Yes Historical Provider, MD  pantoprazole (PROTONIX) 40 MG tablet TAKE 1 TABLET (40 MG TOTAL) BY MOUTH 2 (TWO) TIMES DAILY. 10/12/15  Yes [place], MD  PAZEO 0.7 % SOLN Apply 1 drop to eye daily. 1 drop each eye daily. 11/08/14  Yes Historical Provider, MD  propranolol ER (INDERAL LA) 60 MG 24 hr capsule TAKE 1 CAPSULE (60 MG TOTAL) BY MOUTH AT BEDTIME. 05/24/15  Yes Minus Breeding, MD  ranitidine (ZANTAC) 300 MG tablet TAKE 1 TABLET (300 MG TOTAL) BY MOUTH 2 (TWO) TIMES DAILY. 12/06/15  Yes Abner Greenspan, MD  Riboflavin 400 MG TABS Take 400 mg by mouth daily.   Yes Historical Provider, MD  simvastatin (ZOCOR) 40 MG tablet TAKE ONE TABLET BY MOUTH AT BEDTIME 03/03/16  Yes Minus Breeding, MD  topiramate (TOPAMAX) 50 MG tablet TAKE ONE TABLET IN THE MORNING AND 2 IN THE EVENING 04/30/15  Yes Kathrynn Ducking, MD    ROS:  Out of a complete 14 system review of symptoms, the patient complains only of the following symptoms, and all other reviewed systems are negative.  Runny nose, difficulty swallowing Eye discomfort Nausea Frequent waking Joint pain, back pain, neck pain Headache, numbness, weakness  Blood pressure 129/62, pulse (!) 51, height '5\' 8"'  (1.727 m), weight 206 lb 8 oz (93.7  kg).  Physical Exam  General: The patient is alert and cooperative at the time of the examination. The patient is moderately to markedly obese.  Neuromuscular: The patient is able to elevate the right arm only to about 50.  Skin: No significant peripheral edema is noted.   Neurologic Exam  Mental status: The patient is alert and oriented x 3 at the time of the examination. The patient has apparent normal  recent and remote memory, with an apparently normal attention span and concentration ability.   Cranial nerves: Facial symmetry is present. Speech is normal, no aphasia or dysarthria is noted. Extraocular movements are full. Visual fields are full.  Motor: The patient has good strength in all 4 extremities.  Sensory examination: Soft touch sensation is symmetric on the face, arms, and legs.  Coordination: The patient has good finger-nose-finger and heel-to-shin bilaterally.  Gait and station: The patient has a normal gait. Tandem gait is normal. Romberg is negative. No drift is seen.  Reflexes: Deep tendon reflexes are symmetric.   Assessment/Plan:  1. Intractable migraine headache  The patient currently does not have a headache frequency that would justify the use of Botox. The patient will go up on the gabapentin taking 1 in the morning and 2 in the evening of the 300 mg capsules. The patient will remain on Topamax at 50 mg in the morning and 100 mg in the evening. The patient will call for any dose adjustments. She will follow-up in about 4 months.  Jill Alexanders MD 03/03/2016 2:52 PM  [facility] [address] [place], [place] [address]  Phone [phone] Fax [phone]

## 2016-03-04 DIAGNOSIS — M25511 Pain in right shoulder: Secondary | ICD-10-CM | POA: Diagnosis not present

## 2016-03-04 DIAGNOSIS — M25611 Stiffness of right shoulder, not elsewhere classified: Secondary | ICD-10-CM | POA: Diagnosis not present

## 2016-03-04 DIAGNOSIS — M75121 Complete rotator cuff tear or rupture of right shoulder, not specified as traumatic: Secondary | ICD-10-CM | POA: Diagnosis not present

## 2016-03-25 DIAGNOSIS — M25511 Pain in right shoulder: Secondary | ICD-10-CM | POA: Diagnosis not present

## 2016-03-28 ENCOUNTER — Encounter: Payer: Self-pay | Admitting: Family Medicine

## 2016-03-28 ENCOUNTER — Ambulatory Visit (INDEPENDENT_AMBULATORY_CARE_PROVIDER_SITE_OTHER): Payer: Medicare Other | Admitting: Family Medicine

## 2016-03-28 VITALS — BP 116/68 | HR 66 | Temp 98.6°F | Ht 68.0 in | Wt 204.5 lb

## 2016-03-28 DIAGNOSIS — E039 Hypothyroidism, unspecified: Secondary | ICD-10-CM | POA: Diagnosis not present

## 2016-03-28 DIAGNOSIS — E559 Vitamin D deficiency, unspecified: Secondary | ICD-10-CM

## 2016-03-28 DIAGNOSIS — E7800 Pure hypercholesterolemia, unspecified: Secondary | ICD-10-CM | POA: Diagnosis not present

## 2016-03-28 DIAGNOSIS — Z6831 Body mass index (BMI) 31.0-31.9, adult: Secondary | ICD-10-CM

## 2016-03-28 DIAGNOSIS — N390 Urinary tract infection, site not specified: Secondary | ICD-10-CM

## 2016-03-28 DIAGNOSIS — E6609 Other obesity due to excess calories: Secondary | ICD-10-CM | POA: Diagnosis not present

## 2016-03-28 DIAGNOSIS — Z170 Estrogen receptor positive status [ER+]: Secondary | ICD-10-CM

## 2016-03-28 DIAGNOSIS — N289 Disorder of kidney and ureter, unspecified: Secondary | ICD-10-CM

## 2016-03-28 DIAGNOSIS — R7309 Other abnormal glucose: Secondary | ICD-10-CM | POA: Diagnosis not present

## 2016-03-28 DIAGNOSIS — C50411 Malignant neoplasm of upper-outer quadrant of right female breast: Secondary | ICD-10-CM | POA: Diagnosis not present

## 2016-03-28 LAB — COMPREHENSIVE METABOLIC PANEL
ALT: 37 U/L — ABNORMAL HIGH (ref 6–29)
AST: 35 U/L (ref 10–35)
Albumin: 4.3 g/dL (ref 3.6–5.1)
Alkaline Phosphatase: 55 U/L (ref 33–130)
BILIRUBIN TOTAL: 1.7 mg/dL — AB (ref 0.2–1.2)
BUN: 19 mg/dL (ref 7–25)
CO2: 24 mmol/L (ref 20–31)
CREATININE: 1.19 mg/dL — AB (ref 0.50–0.99)
Calcium: 10.1 mg/dL (ref 8.6–10.4)
Chloride: 105 mmol/L (ref 98–110)
GLUCOSE: 100 mg/dL — AB (ref 65–99)
Potassium: 4.7 mmol/L (ref 3.5–5.3)
SODIUM: 139 mmol/L (ref 135–146)
Total Protein: 7 g/dL (ref 6.1–8.1)

## 2016-03-28 NOTE — Progress Notes (Signed)
Subjective:    Patient ID: [name] [name], female    DOB: October 16, 1955, 61 y.o.   MRN: [id number]  HPI Here for f/u of chronic health problems   Doing ok overall  Still recovering from surgery in Nov (arm/shoulder) Just stepped down from percocet to hydrocodone   Also had the flu - even though she had a flu shot  Ears are still clogged up - hears echo  Still a little cough    Wt Readings from Last 3 Encounters:  03/28/16 204 lb 8 oz (92.8 kg)  03/03/16 206 lb 8 oz (93.7 kg)  11/16/15 205 lb (93 kg)  down 2 lb - not really working on it  Diet is fairly healthy- she makes and effort  Limited for exercise - after shoulder surgery- hopes to get back on treadmill soon  bmi is 31.0  Hx of chronic migraine -up and down  Sees neurology On gabapentin and topamax   Hx of hypothyroidism She sees endocrinologist - continues to go  Went up on her dose and now leveled off   Hx of renal insuff with past chronic utis  Lab Results  Component Value Date   CREATININE 1.3 (H) 11/15/2015   BUN 16.9 11/15/2015   NA 141 11/15/2015   K 4.3 11/15/2015   CL 109 10/20/2014   CO2 20 (L) 11/15/2015   takes keflex for the utis (supposed to be prophylactic -but she takes on and off)  Still gets an infection here and there  Drinks lots of water to prevent utis and help kidney Will check today    Hx of low vit D level 23 in 2013 here  She gets E and D supplement together   Hx of elevated glucose level Lab Results  Component Value Date   HGBA1C 5.4 08/20/2012  diet is not optimal for sugar - she craves salt more than sugar Likes rice and potatoes     BP Readings from Last 3 Encounters:  03/28/16 116/68  03/03/16 129/62  11/15/15 (!) 104/55      Hx of hyperlipidemia Lab Results  Component Value Date   CHOL 135 10/22/2015   HDL 47 10/22/2015   LDLCALC 67 10/22/2015   TRIG 105 10/22/2015   CHOLHDL 2.9 10/22/2015   On zocor and diet   Hx of breast cancer   Her psychology  status is fair - has appt with counseling at end of march    Patient Active Problem List   Diagnosis Date Noted  . Arm pain, right 12/14/2014  . Screening for HIV (human immunodeficiency virus) 12/13/2014  . Need for hepatitis C screening test 12/13/2014  . Intractable chronic migraine without aura and without status migrainosus 03/15/2014  . Genetic testing 01/25/2014  . Breast cancer ([place])   . Breast cancer of upper-outer quadrant of right female breast ([place]) 12/30/2013  . Hypothyroid 12/10/2011  . Adverse effects of medication 10/28/2010  . Routine general medical examination at a health care facility 09/30/2010  . PERIODIC LIMB MOVEMENT DISORDER 08/16/2008  . HYPERSOMNIA 06/21/2008  . Somnolence, daytime 06/07/2008  . Vitamin D deficiency 04/07/2008  . Chronic UTI 11/05/2007  . Renal insufficiency 09/16/2007  . Other abnormal glucose 09/16/2007  . HEMATURIA, MICROSCOPIC, HX OF 09/16/2007  . OTHER ABNORMAL BLOOD CHEMISTRY 09/09/2007  . Hyperlipidemia 08/05/2007  . Obesity 08/05/2007  . DEPRESSIVE DISORDER 08/05/2007  . Intractable chronic migraine without aura 08/05/2007  . Coronary atherosclerosis 08/05/2007  . GERD 08/05/2007  . DEGENERATIVE DISC DISEASE,  LUMBAR SPINE 08/05/2007  . EDEMA 08/05/2007   Past Medical History:  Diagnosis Date  . Anorexia   . Anxiety   . Arrhythmia   . Breast cancer ([place])    ER+/PR+/Her2-  . Concussion 05/26/14  . Depression   . Frequent UTI   . GERD (gastroesophageal reflux disease)   . Hypothyroid   . Microscopic hematuria   . Migraine   . Radiation 03/22/14-04/19/14   Breast Cancer upper-outer  . Radiation 03/2014   right breast  . Restless leg syndrome   . Seasonal allergies   . Stented coronary artery    LAD 95% 2007.  . Vitamin D deficiency   . Wears glasses    Past Surgical History:  Procedure Laterality Date  . ABDOMINAL HYSTERECTOMY    . APPENDECTOMY    . back surgery  1997   herniated disc repair  . CARDIAC  CATHETERIZATION  2007   stent  . CHOLECYSTECTOMY OPEN    . CYSTOSCOPY     neg  . RADIOACTIVE SEED GUIDED MASTECTOMY WITH AXILLARY SENTINEL LYMPH NODE BIOPSY Right 02/14/2014   Procedure: RADIOACTIVE SEED GUIDED PARTIAL MASTECTOMY WITH AXILLARY SENTINEL LYMPH NODE BIOPSY;  Surgeon: Erroll Luna, MD;  Location: [facility];  Service: General;  Laterality: Right;  . SHOULDER SURGERY     Social History  Substance Use Topics  . Smoking status: Never Smoker  . Smokeless tobacco: Never Used  . Alcohol use No     Comment: socially   Family History  Problem Relation Age of Onset  . Coronary artery disease Mother   . Thyroid disease Mother   . Alzheimer's disease Mother   . Diabetes Mother   . Heart disease Mother   . Cancer Brother     melnoma  . Ovarian cancer Paternal Aunt 67  . Bone cancer Paternal Uncle   . Alzheimer's disease Maternal Grandmother   . Heart disease Maternal Grandfather   . Pancreatic cancer Maternal Grandfather 7  . Ovarian cancer Maternal Aunt     dx in 30s  . Throat cancer Maternal Uncle     smoker  . Lung cancer Maternal Uncle     heavy smoker  . Ovarian cancer Paternal Aunt     dx in her 12s  . Cancer Cousin     "female cancer"  . Cancer Father    Allergies  Allergen Reactions  . Iohexol      Desc: throat selling resp distress hives.premedicate pt.entered 07/06/04, bsw.    Current Outpatient Prescriptions on File Prior to Visit  Medication Sig Dispense Refill  . aspirin 81 MG tablet Take 81 mg by mouth daily.    . cephALEXin (KEFLEX) 250 MG capsule Take 250 mg by mouth at bedtime.     Marland Kitchen CINNAMON PO Take 1,000 mg by mouth daily.     . Coenzyme Q10 (CO Q-10) 200 MG CAPS Take 1 capsule by mouth daily.      . fish oil-omega-3 fatty acids 1000 MG capsule Take 1.2 g by mouth 2 (two) times daily.    . furosemide (LASIX) 20 MG tablet TAKE 1 TABLET (20 MG TOTAL) BY MOUTH DAILY. 90 tablet 2  . gabapentin (NEURONTIN) 300 MG capsule One  capsule in the morning and 2 in the evening 270 capsule 1  . Ginkgo Biloba 120 MG CAPS Take 1 capsule by mouth daily.      Marland Kitchen KLOR-CON M20 20 MEQ tablet TAKE 2 TABLETS BY MOUTH TWICE A DAY AND  1/2 TABLET AT BEDTIME 405 tablet 1  . levothyroxine (SYNTHROID, LEVOTHROID) 137 MCG tablet Take 137 mcg by mouth daily before breakfast.     . Magnesium 250 MG TABS Take 250 mg by mouth daily.    . Multiple Vitamin (MULTIVITAMIN) capsule Take 1 capsule by mouth daily.    . nitroGLYCERIN (NITROSTAT) 0.4 MG SL tablet Place 1 tablet (0.4 mg total) under the tongue every 5 (five) minutes as needed. Pain 25 tablet 3  . oxyCODONE-acetaminophen (PERCOCET/ROXICET) 5-325 MG tablet Take 1 tablet by mouth 4 (four) times daily.    . pantoprazole (PROTONIX) 40 MG tablet TAKE 1 TABLET (40 MG TOTAL) BY MOUTH 2 (TWO) TIMES DAILY. 180 tablet 3  . PAZEO 0.7 % SOLN Apply 1 drop to eye daily. 1 drop each eye daily.    . propranolol ER (INDERAL LA) 60 MG 24 hr capsule TAKE 1 CAPSULE (60 MG TOTAL) BY MOUTH AT BEDTIME. 90 capsule 3  . ranitidine (ZANTAC) 300 MG tablet TAKE 1 TABLET (300 MG TOTAL) BY MOUTH 2 (TWO) TIMES DAILY. 60 tablet 3  . Riboflavin 400 MG TABS Take 400 mg by mouth daily.    . simvastatin (ZOCOR) 40 MG tablet TAKE ONE TABLET BY MOUTH AT BEDTIME 90 tablet 0  . topiramate (TOPAMAX) 50 MG tablet TAKE ONE TABLET IN THE MORNING AND 2 IN THE EVENING 270 tablet 3   No current facility-administered medications on file prior to visit.     Review of Systems    Review of Systems  Constitutional: Negative for fever, appetite change,  and unexpected weight change. Pos for chronic fatigue  Eyes: Negative for pain and visual disturbance.  Respiratory: Negative for cough and shortness of breath.   Cardiovascular: Negative for cp or palpitations    Gastrointestinal: Negative for nausea, diarrhea and constipation.  Genitourinary: Negative for urgency and frequency.  Skin: Negative for pallor or rash   MSK pos for arm pain  that is improving now after surgery Neurological: Negative for weakness, light-headedness, numbness and headaches.  Hematological: Negative for adenopathy. Does not bruise/bleed easily.  Psychiatric/Behavioral: pos for stable anx and dep mood       Objective:   Physical Exam  Constitutional: She appears well-developed and well-nourished. No distress.  obese and well appearing   HENT:  Head: Normocephalic and atraumatic.  Mouth/Throat: Oropharynx is clear and moist.  Eyes: Conjunctivae and EOM are normal. Pupils are equal, round, and reactive to light.  Neck: Normal range of motion. Neck supple. No JVD present. Carotid bruit is not present. No thyromegaly present.  Cardiovascular: Normal rate, regular rhythm, normal heart sounds and intact distal pulses.  Exam reveals no gallop.   Pulmonary/Chest: Effort normal and breath sounds normal. No respiratory distress. She has no wheezes. She has no rales.  No crackles  Abdominal: Soft. Bowel sounds are normal. She exhibits no distension, no abdominal bruit and no mass. There is no tenderness.  No suprapubic tenderness or fullness  No cva tenderness   Musculoskeletal: She exhibits no edema.  Lymphadenopathy:    She has no cervical adenopathy.  Neurological: She is alert. She has normal reflexes.  Skin: Skin is warm and dry. No rash noted. No pallor.  Psychiatric: Her mood appears anxious. She exhibits a depressed mood.  Baseline affect- dep and anx but improved from last visit and she is talkative Not tearful          Assessment & Plan:   Problem List Items Addressed This Visit  Endocrine   Hypothyroid - Primary    Seeing endocrinology - stable per pt         Genitourinary   Chronic UTI    Pt takes her keflex more prn than daily - meant to be prophylactic  Hx of renal insuff and want to prevent utis      Renal insufficiency    Renal panel today  Hx of frequent utis -supp to be on prophylactic keflex but she takes it  more prn (disc this) Lab today  Enc fluid intake-at least 64 oz daily      Relevant Orders   Comprehensive metabolic panel (Completed)     Other   Breast cancer of upper-outer quadrant of right female breast ([place])    Doing well with f/u after treatment -no new developments       Hyperlipidemia    Disc goals for lipids and reasons to control them Rev last labs with pt Rev low sat fat diet in detail  On zocor and diet      Obesity   Other abnormal glucose    Due for A1C She is eating less sugar and wt is stable  Disc imp of low glycemic diet and wt loss to prevent DM      Relevant Orders   Hemoglobin A1c (Completed)   Vitamin D deficiency    Check D level today        Relevant Orders   VITAMIN D 25 Hydroxy (Vit-D Deficiency, Fractures) (Completed)

## 2016-03-28 NOTE — Patient Instructions (Addendum)
Keep up the good work with PT  For ear congestion-try flonase over the counter - daily for 2 weeks or longer if you have allergies (through the allergy season)   Aim for 64 oz of fluids per day (mostly water)   Take care of yourself and stay as active as you can be  Try to get your carbohydrates from produce (fruits and veg)- and avoid the refined/processed carbohydrates  Eat lean protein    Labs today

## 2016-03-28 NOTE — Progress Notes (Signed)
Pre visit review using our clinic review tool, if applicable. No additional management support is needed unless otherwise documented below in the visit note. 

## 2016-03-29 LAB — HEMOGLOBIN A1C
Hgb A1c MFr Bld: 5.8 % — ABNORMAL HIGH (ref <5.7)
MEAN PLASMA GLUCOSE: 120 mg/dL

## 2016-03-29 LAB — VITAMIN D 25 HYDROXY (VIT D DEFICIENCY, FRACTURES): Vit D, 25-Hydroxy: 38 ng/mL (ref 30–100)

## 2016-03-30 NOTE — Assessment & Plan Note (Signed)
Disc goals for lipids and reasons to control them Rev last labs with pt Rev low sat fat diet in detail  On zocor and diet

## 2016-03-30 NOTE — Assessment & Plan Note (Signed)
Renal panel today  Hx of frequent utis -supp to be on prophylactic keflex but she takes it more prn (disc this) Lab today  Enc fluid intake-at least 64 oz daily

## 2016-03-30 NOTE — Assessment & Plan Note (Signed)
Pt takes her keflex more prn than daily - meant to be prophylactic  Hx of renal insuff and want to prevent utis

## 2016-03-30 NOTE — Assessment & Plan Note (Signed)
Check D level today

## 2016-03-30 NOTE — Assessment & Plan Note (Signed)
Due for A1C She is eating less sugar and wt is stable  Disc imp of low glycemic diet and wt loss to prevent DM

## 2016-03-30 NOTE — Assessment & Plan Note (Signed)
Seeing endocrinology - stable per pt

## 2016-03-30 NOTE — Assessment & Plan Note (Signed)
Doing well with f/u after treatment -no new developments

## 2016-04-01 DIAGNOSIS — M25611 Stiffness of right shoulder, not elsewhere classified: Secondary | ICD-10-CM | POA: Diagnosis not present

## 2016-04-01 DIAGNOSIS — M75121 Complete rotator cuff tear or rupture of right shoulder, not specified as traumatic: Secondary | ICD-10-CM | POA: Diagnosis not present

## 2016-04-01 DIAGNOSIS — M25511 Pain in right shoulder: Secondary | ICD-10-CM | POA: Diagnosis not present

## 2016-04-08 DIAGNOSIS — F331 Major depressive disorder, recurrent, moderate: Secondary | ICD-10-CM | POA: Diagnosis not present

## 2016-04-09 DIAGNOSIS — M25611 Stiffness of right shoulder, not elsewhere classified: Secondary | ICD-10-CM | POA: Diagnosis not present

## 2016-04-09 DIAGNOSIS — M75121 Complete rotator cuff tear or rupture of right shoulder, not specified as traumatic: Secondary | ICD-10-CM | POA: Diagnosis not present

## 2016-04-09 DIAGNOSIS — M25511 Pain in right shoulder: Secondary | ICD-10-CM | POA: Diagnosis not present

## 2016-04-14 ENCOUNTER — Telehealth: Payer: Self-pay | Admitting: Family Medicine

## 2016-04-14 DIAGNOSIS — M75121 Complete rotator cuff tear or rupture of right shoulder, not specified as traumatic: Secondary | ICD-10-CM | POA: Diagnosis not present

## 2016-04-14 DIAGNOSIS — M25611 Stiffness of right shoulder, not elsewhere classified: Secondary | ICD-10-CM | POA: Diagnosis not present

## 2016-04-14 DIAGNOSIS — M25511 Pain in right shoulder: Secondary | ICD-10-CM | POA: Diagnosis not present

## 2016-04-14 NOTE — Telephone Encounter (Signed)
Called to schedule AWV. Phone is not accepting calls at this time. Will try again later.

## 2016-04-16 NOTE — Progress Notes (Signed)
HPI The patient presents for followup of her known coronary disease.  At the last visit she had a Lexiscan Myoview prior to shoulder surgery.  This was low risk.  She also had increased palpitations and I suggested that she get an AliveCore device.  However, she did not do this.  She is making a slow recovery from her shoulder.  She is doing PT.  She has had no new symptoms although she still has some occasional chest pain.  This is atypical as previously described.  She has continued occasional palpitations.  She denies syncope or presyncope.    Allergies  Allergen Reactions  . Iohexol      Desc: throat selling resp distress hives.premedicate pt.entered 07/06/04, bsw.     Current Outpatient Prescriptions  Medication Sig Dispense Refill  . aspirin 81 MG tablet Take 81 mg by mouth daily.    . cephALEXin (KEFLEX) 250 MG capsule Take 250 mg by mouth at bedtime.     Marland Kitchen CINNAMON PO Take 1,000 mg by mouth daily.     . Coenzyme Q10 (CO Q-10) 200 MG CAPS Take 1 capsule by mouth daily.      . fish oil-omega-3 fatty acids 1000 MG capsule Take 1.2 g by mouth 2 (two) times daily.    . furosemide (LASIX) 20 MG tablet TAKE 1 TABLET (20 MG TOTAL) BY MOUTH DAILY. 90 tablet 2  . gabapentin (NEURONTIN) 300 MG capsule One capsule in the morning and 2 in the evening 270 capsule 1  . Ginkgo Biloba 120 MG CAPS Take 1 capsule by mouth daily.      Marland Kitchen HYDROcodone-acetaminophen (NORCO/VICODIN) 5-325 MG tablet Take 1 tablet by mouth 4 (four) times daily.    Marland Kitchen KLOR-CON M20 20 MEQ tablet TAKE 2 TABLETS BY MOUTH TWICE A DAY AND 1/2 TABLET AT BEDTIME 405 tablet 1  . levothyroxine (SYNTHROID, LEVOTHROID) 137 MCG tablet Take 137 mcg by mouth daily before breakfast.     . Magnesium 250 MG TABS Take 250 mg by mouth daily.    . Multiple Vitamin (MULTIVITAMIN) capsule Take 1 capsule by mouth daily.    . nitroGLYCERIN (NITROSTAT) 0.4 MG SL tablet Place 1 tablet (0.4 mg total) under the tongue every 5 (five) minutes as needed.  Pain 25 tablet 3  . pantoprazole (PROTONIX) 40 MG tablet TAKE 1 TABLET (40 MG TOTAL) BY MOUTH 2 (TWO) TIMES DAILY. 180 tablet 3  . PAZEO 0.7 % SOLN Apply 1 drop to eye daily. 1 drop each eye daily.    . propranolol ER (INDERAL LA) 60 MG 24 hr capsule TAKE 1 CAPSULE (60 MG TOTAL) BY MOUTH AT BEDTIME. 90 capsule 3  . ranitidine (ZANTAC) 300 MG tablet TAKE 1 TABLET (300 MG TOTAL) BY MOUTH 2 (TWO) TIMES DAILY. 60 tablet 3  . Riboflavin 400 MG TABS Take 400 mg by mouth daily.    . simvastatin (ZOCOR) 40 MG tablet TAKE ONE TABLET BY MOUTH AT BEDTIME 90 tablet 0  . topiramate (TOPAMAX) 50 MG tablet TAKE ONE TABLET IN THE MORNING AND 2 IN THE EVENING 270 tablet 3   No current facility-administered medications for this visit.     Past Medical History:  Diagnosis Date  . Anorexia   . Anxiety   . Arrhythmia   . Breast cancer ([place])    ER+/PR+/Her2-  . Concussion 05/26/14  . Depression   . Frequent UTI   . GERD (gastroesophageal reflux disease)   . Hypothyroid   . Microscopic  hematuria   . Migraine   . Radiation 03/22/14-04/19/14   Breast Cancer upper-outer  . Radiation 03/2014   right breast  . Restless leg syndrome   . Seasonal allergies   . Stented coronary artery    LAD 95% 2007.  . Vitamin D deficiency   . Wears glasses     Past Surgical History:  Procedure Laterality Date  . ABDOMINAL HYSTERECTOMY    . APPENDECTOMY    . back surgery  1997   herniated disc repair  . CARDIAC CATHETERIZATION  2007   stent  . CHOLECYSTECTOMY OPEN    . CYSTOSCOPY     neg  . RADIOACTIVE SEED GUIDED MASTECTOMY WITH AXILLARY SENTINEL LYMPH NODE BIOPSY Right 02/14/2014   Procedure: RADIOACTIVE SEED GUIDED PARTIAL MASTECTOMY WITH AXILLARY SENTINEL LYMPH NODE BIOPSY;  Surgeon: Erroll Luna, MD;  Location: [facility];  Service: General;  Laterality: Right;  . SHOULDER SURGERY      ROS:   As stated in the HPI and negative for all other systems.  PHYSICAL EXAM BP 116/71 (BP Location:  Right Arm)   Pulse (!) 52   Ht '5\' 8"'  (1.727 m)   Wt 207 lb 6.4 oz (94.1 kg)   BMI 31.54 kg/m  GENERAL:  Well appearing NECK:  No jugular venous distention, waveform within normal limits, carotid upstroke brisk and symmetric, no bruits, no thyromegaly LUNGS:  Clear to auscultation bilaterally BACK:  No CVA tenderness CHEST:  Unremarkable HEART:  PMI not displaced or sustained,S1 and S2 within normal limits, no S3, no S4, no clicks, no rubs, no murmurs ABD:  Flat, positive bowel sounds normal in frequency in pitch, no bruits, no rebound, no guarding, no midline pulsatile mass, no hepatomegaly, no splenomegaly EXT:  2 plus pulses throughout, no edema, no cyanosis no clubbing     Lab Results  Component Value Date   CHOL 135 10/22/2015   TRIG 105 10/22/2015   HDL 47 10/22/2015   LDLCALC 67 10/22/2015     ASSESSMENT AND PLAN   Palpitations - She did not want to get an AliveCor.  Her symptoms are stable.  I would not suggest any change in meds.   CAD - Since the stress test in October she has had no new symptoms.  No change in therapy is planned.    DYSLIPIDEMIA -  She had an excellent lipid profile as above.  She will have no change in meds.

## 2016-04-17 ENCOUNTER — Ambulatory Visit (INDEPENDENT_AMBULATORY_CARE_PROVIDER_SITE_OTHER): Payer: Medicare Other | Admitting: Cardiology

## 2016-04-17 ENCOUNTER — Encounter: Payer: Self-pay | Admitting: Cardiology

## 2016-04-17 VITALS — BP 116/71 | HR 52 | Ht 68.0 in | Wt 207.4 lb

## 2016-04-17 DIAGNOSIS — R002 Palpitations: Secondary | ICD-10-CM

## 2016-04-17 DIAGNOSIS — E785 Hyperlipidemia, unspecified: Secondary | ICD-10-CM

## 2016-04-17 DIAGNOSIS — I2510 Atherosclerotic heart disease of native coronary artery without angina pectoris: Secondary | ICD-10-CM | POA: Diagnosis not present

## 2016-04-17 NOTE — Patient Instructions (Signed)

## 2016-04-18 ENCOUNTER — Encounter: Payer: Self-pay | Admitting: Cardiology

## 2016-04-22 DIAGNOSIS — M25511 Pain in right shoulder: Secondary | ICD-10-CM | POA: Diagnosis not present

## 2016-04-23 DIAGNOSIS — M25611 Stiffness of right shoulder, not elsewhere classified: Secondary | ICD-10-CM | POA: Diagnosis not present

## 2016-04-23 DIAGNOSIS — M75121 Complete rotator cuff tear or rupture of right shoulder, not specified as traumatic: Secondary | ICD-10-CM | POA: Diagnosis not present

## 2016-04-23 DIAGNOSIS — M25511 Pain in right shoulder: Secondary | ICD-10-CM | POA: Diagnosis not present

## 2016-05-01 DIAGNOSIS — M25611 Stiffness of right shoulder, not elsewhere classified: Secondary | ICD-10-CM | POA: Diagnosis not present

## 2016-05-01 DIAGNOSIS — M25511 Pain in right shoulder: Secondary | ICD-10-CM | POA: Diagnosis not present

## 2016-05-01 DIAGNOSIS — M75121 Complete rotator cuff tear or rupture of right shoulder, not specified as traumatic: Secondary | ICD-10-CM | POA: Diagnosis not present

## 2016-05-01 IMAGING — MG MM DIGITAL DIAGNOSTIC UNILAT*R*
2 series · 2 of 2 positions shown · non-contrast
Comparison: Previous exams

CLINICAL DATA: Post ultrasound-guided right breast biopsy for a 9
o'clock location mass

EXAM:
DIAGNOSTIC right MAMMOGRAM POST ULTRASOUND BIOPSY

[R CC]
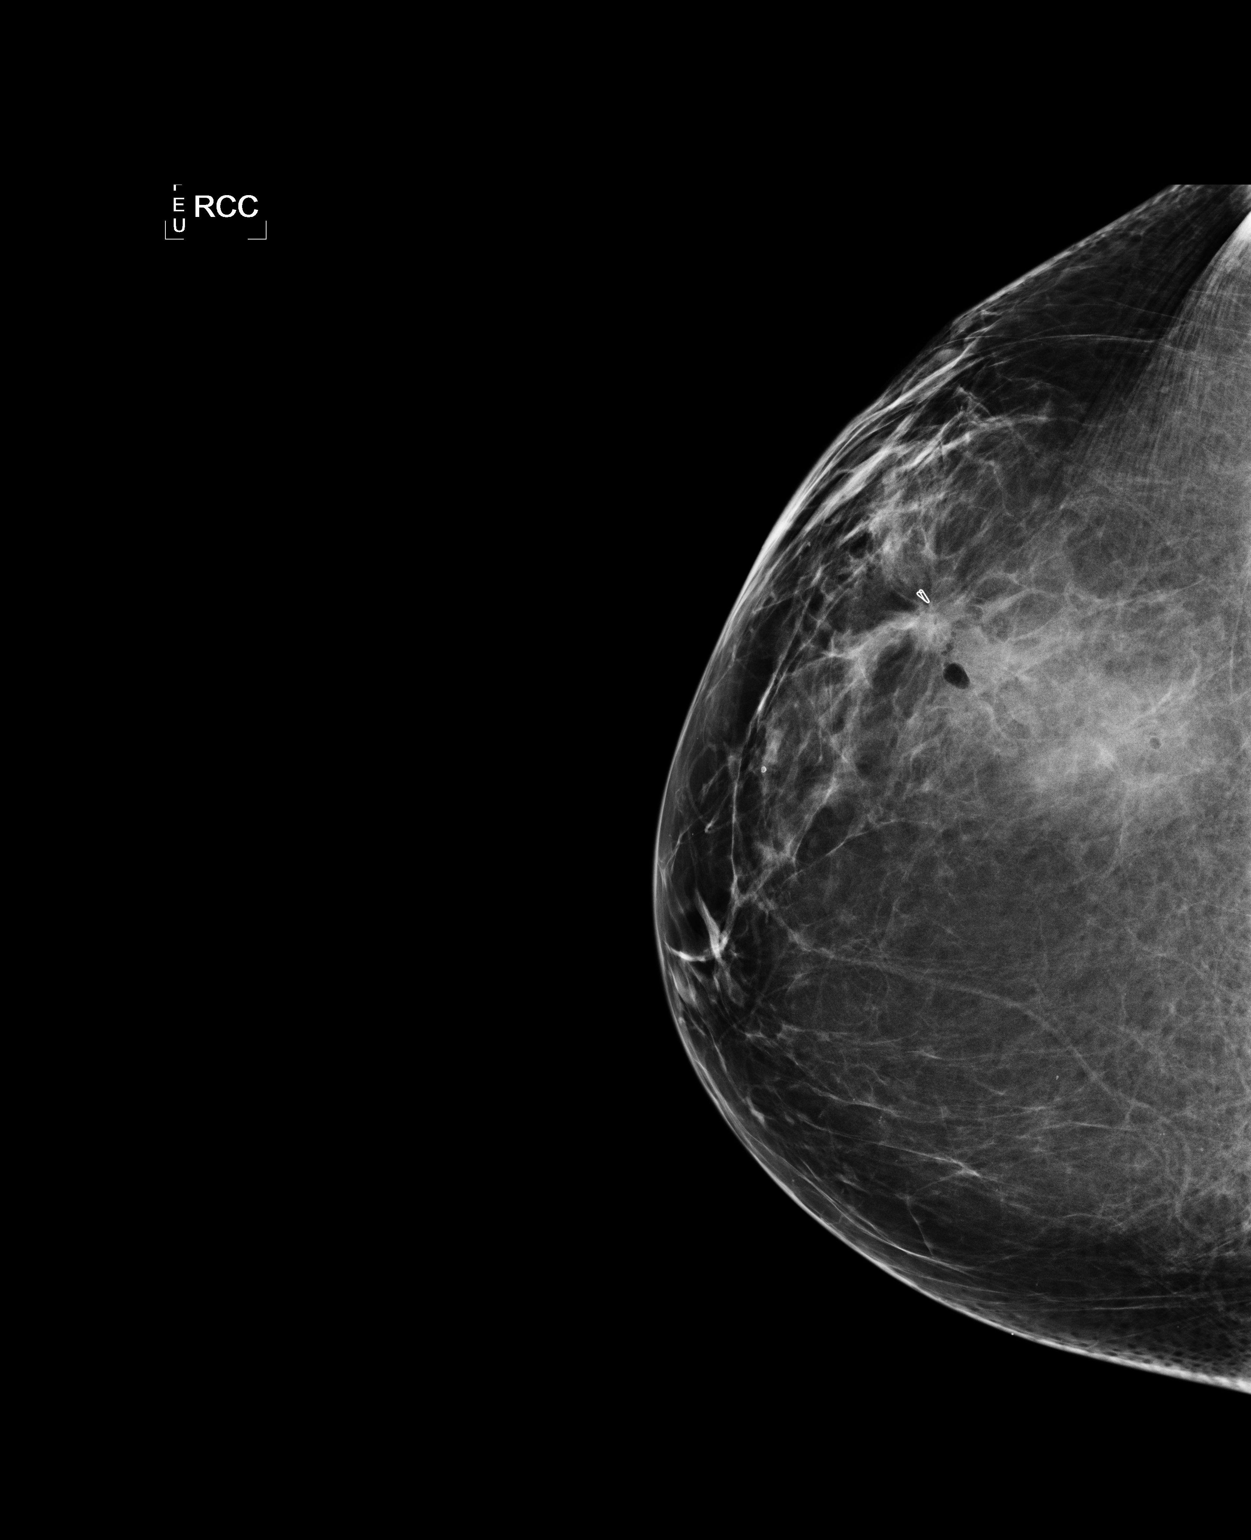

[R ML]
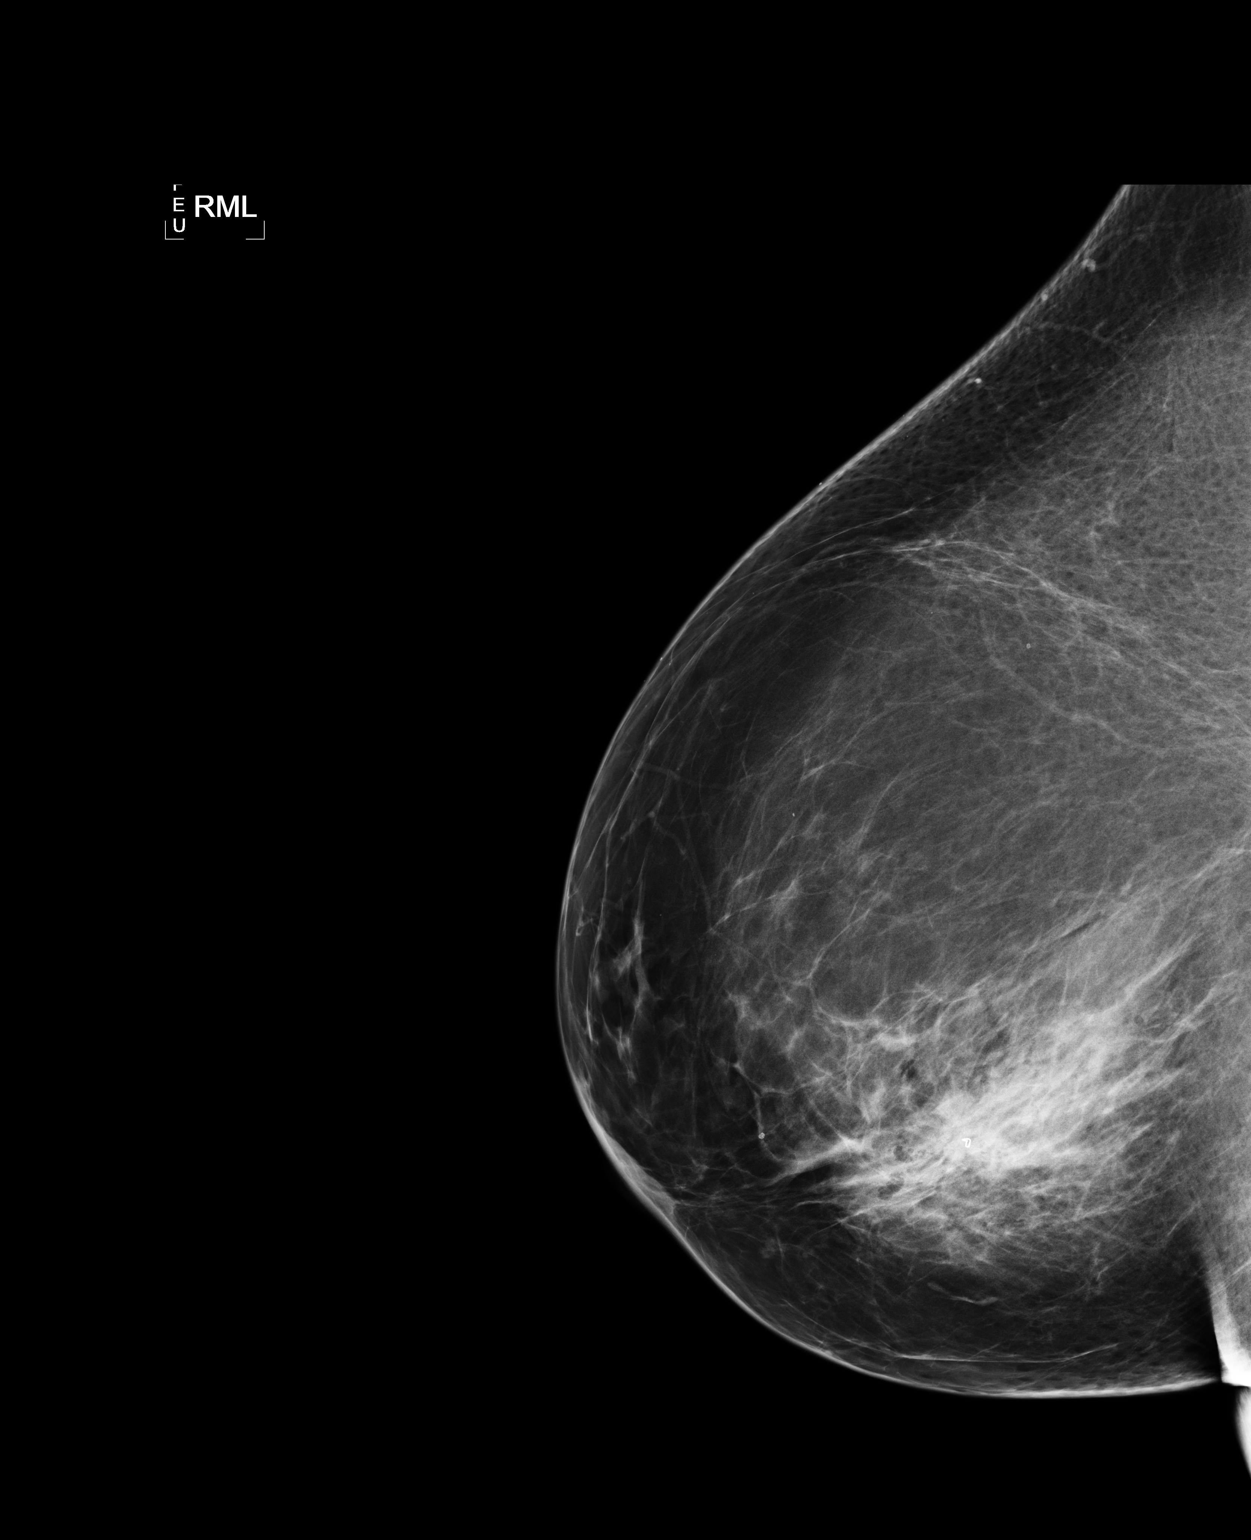

[2 of 2 positions shown; findings below may reference images not displayed]

FINDINGS: Mammographic images were obtained following ultrasound guided biopsy
of right breast mass 9 o'clock location with heart shaped clip
placement. The clip is appropriately located.
IMPRESSION: Appropriate heart shaped clip location, right breast 9 o'clock
location.

Final Assessment: Post Procedure Mammograms for Marker Placement

## 2016-05-12 NOTE — Progress Notes (Signed)
[facility]  Telephone:(336) [phone] Fax:(336) [phone]  Clinic Follow Up Note   Patient Care Team: Abner Greenspan, MD as PCP - General Minus Breeding, MD as Consulting Physician (Cardiology) Holley Bouche, NP as Nurse Practitioner (Nurse Practitioner) Erroll Luna, MD as Consulting Physician (General Surgery) Thea Silversmith, MD (Inactive) as Consulting Physician (Radiation Oncology) Truitt Merle, MD as Consulting Physician (Hematology) Susa Day, MD as Consulting Physician (Orthopedic Surgery) 05/16/2016  CHIEF COMPLAINTS Follow up stage IA right breast cancer, [id number]    Breast cancer of upper-outer quadrant of right female breast ([place])   12/29/2013 Imaging    Screening mammogram and [place]: an irregular shadowing hypoechoic mass right breast 9 o'clock location 4 cm from the nipple measuring 7 x 7 x 7 mm. No adenopathy       12/29/2013 Initial Diagnosis    Breast cancer of upper-outer quadrant of right female breast      12/29/2013 Initial Biopsy    Grade I-II IDC, ER+ (98%), PR+ (81%), HER2- (ratio 1.24), Ki67 15%       01/11/2014 Procedure    Genetic counseling/testing: Revealed 1 VUS in ATM gene, p.D1641H (c.4921G>C).  Otherwise, genetic testing negative.       02/14/2014 Surgery    Right lumpectomy with SLNB ([facility]): Grade 2 IDC spanning 0.9 cm with associated grade 2 DCIS.  Negative margins. 5 sentinel lymph nodes negative.  HER2 repeated and neg. (ratio 1.16).      02/14/2014 Pathologic Stage    pT1bpN0; Stage IA      02/14/2014 Oncotype testing    Recurrence score 21 (13% risk of distant recurrence); no adjuvant chemo offered.       03/22/2014 - 04/19/2014 Radiation Therapy    Adjuvant radiation Pablo Ledger); Right breast: Total dose 42.72 Gy over 21 fractions. Right breast boost: Total dose 10 Gy over 5 fractions.        Anti-estrogen oral therapy    Anti-estrogen therapy was recommended by Dr. Burr Medico; pt declines anti-estrogen treatment.        05/25/2014 Survivorship    Survivorship Care Plan given to patient and reviewed with her in-person.         HISTORY OF PRESENTING ILLNESS (01/04/2014):  [name] [name] 61 y.o. female presents to our breast multidisciplinary clinic today because of newly diagnosed breast cancer.   This was discovered by routine screening mammogram a few weeks ago. She underwent diagnostic mammogram and ultrasound on 12/29/2013 which showed an irregular shadowing hypoechoic mass in right breast likely position measuring 7 mm. No adenopathy. She underwent core needle biopsy on the same day, which showed grade 1-2 invasive ductal adenocarcinoma, ER positive PR positive and HER-2 negative, Ki-67 10%.  She has been feeling very anxious since the biopsy. She does have history of coronary artery disease, status post stent placement in [facility] 7. Since the breast cancer diagnosis, she has had several episodes of anxiety attack, hypoventilation, with chest tightness. She appeared quite nervous when I met her first in the clinic. Before the biopsy, she felt well in general, she does have intermittent migraine headaches, chronic leg swollen, intermittent chest pain, denies any other new symptoms, no weight loss or other changes.  She lives with her only son, who is 48 years old, on disability due to depression and suicidal ideas. She is his caregiver.  CURRENT THERAPY: Surveillance  INTERIM HISTORY: She returns for follow up. She is doing well overall. She had surgery in November for her shoulder and it went  well. She is still in physical therapy and is improving. She reports her stomach bothers her only occasionally. Her appetite is "too good". She denies any bowel issues. Admits she drinks wine very seldomly on special occasions. She was taking Tylenol about four times per day because of her shoulder. States she has not taken Tylenol in about one week. Her gallbladder has been removed. She denies back pain. She denies  change in urine color.   MEDICAL HISTORY:  Past Medical History:  Diagnosis Date  . Anorexia   . Anxiety   . Arrhythmia   . Breast cancer ([place])    ER+/PR+/Her2-  . Concussion 05/26/14  . Depression   . Frequent UTI   . GERD (gastroesophageal reflux disease)   . Hypothyroid   . Microscopic hematuria   . Migraine   . Radiation 03/22/14-04/19/14   Breast Cancer upper-outer  . Radiation 03/2014   right breast  . Restless leg syndrome   . Seasonal allergies   . Stented coronary artery    LAD 95% 2007.  . Vitamin D deficiency   . Wears glasses    She has psychologist Dr. Rica Mote   SURGICAL HISTORY: Past Surgical History:  Procedure Laterality Date  . ABDOMINAL HYSTERECTOMY    . APPENDECTOMY    . back surgery  1997   herniated disc repair  . CARDIAC CATHETERIZATION  2007   stent  . CHOLECYSTECTOMY OPEN    . CYSTOSCOPY     neg  . RADIOACTIVE SEED GUIDED MASTECTOMY WITH AXILLARY SENTINEL LYMPH NODE BIOPSY Right 02/14/2014   Procedure: RADIOACTIVE SEED GUIDED PARTIAL MASTECTOMY WITH AXILLARY SENTINEL LYMPH NODE BIOPSY;  Surgeon: Erroll Luna, MD;  Location: [facility];  Service: General;  Laterality: Right;  . SHOULDER SURGERY     Back surgery in 1997   SOCIAL HISTORY: Social History   Social History  . Marital status: Widowed    Spouse name: N/A  . Number of children: 1  . Years of education: some coll.   Occupational History  . not working    Social History Main Topics  . Smoking status: Never Smoker  . Smokeless tobacco: Never Used  . Alcohol use No     Comment: socially  . Drug use: No  . Sexual activity: Not Asked   Other Topics Concern  . None   Social History Narrative   Does not get any regular exercise      Doctors   -uro-[place]   -cardio--hochrein   -Neurology--Dr. Jannifer Franklin   -allergies--Dr. Carmelina Peal   Gyn past--Dr. Warnell Forester   Couselor--Dr. Mitchum   Endo-- Dr. Chalmers Cater      Lives with son who is 49 years old   Patient is right  handed.   Patient drinks 1-2 cups of caffeine daily.    FAMILY HISTORY: Family History  Problem Relation Age of Onset  . Coronary artery disease Mother   . Thyroid disease Mother   . Alzheimer's disease Mother   . Diabetes Mother   . Heart disease Mother   . Cancer Brother     melnoma  . Ovarian cancer Paternal Aunt 90  . Bone cancer Paternal Uncle   . Alzheimer's disease Maternal Grandmother   . Heart disease Maternal Grandfather   . Pancreatic cancer Maternal Grandfather 16  . Ovarian cancer Maternal Aunt     dx in 77s  . Throat cancer Maternal Uncle     smoker  . Lung cancer Maternal Uncle     heavy  smoker  . Ovarian cancer Paternal Aunt     dx in her 42s  . Cancer Cousin     "female cancer"  . Cancer Father    GYN HISTORY  Menarchal: 10 LMP: 1983 hysrectomy  Contraceptive: 2 years  HRT: was on for 10-20 years, off several years  GxP1: several miscarriages     ALLERGIES:  is allergic to iohexol.  MEDICATIONS:  Allergies as of 05/16/2016      Reactions   Iohexol     Desc: throat selling resp distress hives.premedicate pt.entered 07/06/04, bsw.      Medication List       Accurate as of 05/16/16 11:18 PM. Always use your most recent med list.          aspirin 81 MG tablet Take 81 mg by mouth daily.   cephALEXin 250 MG capsule Commonly known as:  KEFLEX Take 250 mg by mouth at bedtime.   CINNAMON PO Take 1,000 mg by mouth daily.   Co Q-10 200 MG Caps Take 1 capsule by mouth daily.   fish oil-omega-3 fatty acids 1000 MG capsule Take 1.2 g by mouth 2 (two) times daily.   furosemide 20 MG tablet Commonly known as:  LASIX TAKE 1 TABLET (20 MG TOTAL) BY MOUTH DAILY.   gabapentin 300 MG capsule Commonly known as:  NEURONTIN One capsule in the morning and 2 in the evening   Ginkgo Biloba 120 MG Caps Take 1 capsule by mouth daily.   KLOR-CON M20 20 MEQ tablet Generic drug:  potassium chloride SA TAKE 2 TABLETS BY MOUTH TWICE A DAY AND 1/2  TABLET AT BEDTIME   levothyroxine 137 MCG tablet Commonly known as:  SYNTHROID, LEVOTHROID Take 137 mcg by mouth daily before breakfast.   Magnesium 250 MG Tabs Take 250 mg by mouth daily.   multivitamin capsule Take 1 capsule by mouth daily.   nitroGLYCERIN 0.4 MG SL tablet Commonly known as:  NITROSTAT Place 1 tablet (0.4 mg total) under the tongue every 5 (five) minutes as needed. Pain   pantoprazole 40 MG tablet Commonly known as:  PROTONIX TAKE 1 TABLET (40 MG TOTAL) BY MOUTH 2 (TWO) TIMES DAILY.   PAZEO 0.7 % Soln Generic drug:  Olopatadine HCl Apply 1 drop to eye daily. 1 drop each eye daily.   propranolol ER 60 MG 24 hr capsule Commonly known as:  INDERAL LA TAKE 1 CAPSULE (60 MG TOTAL) BY MOUTH AT BEDTIME.   ranitidine 300 MG tablet Commonly known as:  ZANTAC TAKE 1 TABLET (300 MG TOTAL) BY MOUTH 2 (TWO) TIMES DAILY.   Riboflavin 400 MG Tabs Take 400 mg by mouth daily.   simvastatin 40 MG tablet Commonly known as:  ZOCOR TAKE ONE TABLET BY MOUTH AT BEDTIME   topiramate 50 MG tablet Commonly known as:  TOPAMAX TAKE ONE TABLET IN THE MORNING AND 2 IN THE EVENING        REVIEW OF SYSTEMS:   Constitutional: Denies fevers, chills or abnormal night sweats Eyes: Denies blurriness of vision, double vision or watery eyes Ears, nose, mouth, throat, and face: Denies mucositis or sore throat Respiratory: Denies cough, dyspnea or wheezes Cardiovascular: Negative Gastrointestinal:  Denies nausea, heartburn or change in bowel habits Skin: Denies abnormal skin rashes Lymphatics: Denies new lymphadenopathy or easy bruising Neurological:Denies numbness, tingling or new weaknesses, (+) right upper arm pain, improved with surgery Behavioral/Psych: (+) very anxious All other systems were reviewed with the patient and are negative.  PHYSICAL EXAMINATION:  ECOG PERFORMANCE STATUS: 1  Vitals:   05/16/16 1411  BP: 120/67  Pulse: 60  Resp: 18  Temp: 98.6 F (37 C)     Filed Weights   05/16/16 1411  Weight: 207 lb 14.4 oz (94.3 kg)    GENERAL:alert, no distress and comfortable SKIN: skin color, texture, turgor are normal, no rashes or significant lesions EYES: normal, conjunctiva are pink and non-injected, sclera clear OROPHARYNX:no exudate, no erythema and lips, buccal mucosa, and tongue normal  NECK: supple, thyroid normal size, non-tender, without nodularity LYMPH:  no palpable lymphadenopathy in the cervical, axillary or inguinal LUNGS: clear to auscultation and percussion with normal breathing effort HEART: regular rate & rhythm and no murmurs and no lower extremity edema ABDOMEN:abdomen soft, non-tender and normal bowel sounds Musculoskeletal:no cyanosis of digits and no clubbing, no significant edema on all her extremities, no tenderness of the right shoulder or arm, the range of right shoulder is normal. PSYCH: alert & oriented x 3 with fluent speech NEURO: no focal motor/sensory deficits Breasts: Breast inspection showed them to be symmetrical with no nipple discharge. Palpation of the breasts and axilla revealed no obvious mass that I could appreciate. Surgical scar in the right breast is well-healed with some scar tissue underneath.    LABORATORY DATA:  I have reviewed the data as listed Lab Results  Component Value Date   WBC 5.7 05/16/2016   HGB 13.7 05/16/2016   HCT 39.6 05/16/2016   MCV 89.0 05/16/2016   PLT 187 05/16/2016    Recent Labs  10/22/15 1145 11/15/15 1535 03/28/16 1638 05/16/16 1321  NA  --  141 139 144  K  --  4.3 4.7 4.8  CL  --   --  105  --   CO2  --  20* 24 24  GLUCOSE  --  119 100* 130  BUN  --  16.9 19 18.8  CREATININE  --  1.3* 1.19* 1.3*  CALCIUM  --  9.7 10.1 10.1  PROT 6.5 7.1 7.0 7.3  ALBUMIN 4.3 3.8 4.3 4.1  AST 31 32 35 31  ALT 33* 32 37* 37  ALKPHOS 44 56 55 61  BILITOT 1.1 1.30* 1.7* 1.65*  BILIDIR 0.2  --   --   --   IBILI 0.9  --   --   --    PATHOLOGY REPORT  12/30/2013 Diagnosis 1. Breast, lumpectomy, Right 02/14/2014 - INVASIVE GRADE II DUCTAL CARCINOMA SPANNING 0.9 CM IN GREATEST DIMENSION. - ASSOCIATED INTERMEDIATE GRADE DUCTAL CARCINOMA IN SITU. - MARGINS ARE NEGATIVE. - SEE ONCOLOGY TEMPLATE. 2. Breast, excision, Right - BENIGN BREAST PARENCHYMA WITH SCATTERED ACUTE AND CHRONIC INFLAMMATION. - NO ATYPIA, HYPERPLASIA, OR MALIGNANCY IDENTIFIED. 3. Lymph node, sentinel, biopsy, Right axillary - ONE BENIGN LYMPH NODE WITH NO TUMOR SEEN (0/1). 4. Lymph node, sentinel, biopsy, Right axillary - ONE BENIGN LYMPH NODE WITH NO TUMOR SEEN (0/1). 5. Lymph node, sentinel, biopsy, Right axillary - ONE BENIGN LYMPH NODE WITH NO TUMOR SEEN (0/1). 6. Lymph node, sentinel, biopsy, Right axillary - ONE BENIGN LYMPH NODE WITH NO TUMOR SEEN (0/1). 7. Lymph node, sentinel, biopsy, Right axillary - ONE BENIGN LYMPH NODE WITH NO TUMOR SEEN (0/1). 1. BREAST, INVASIVE TUMOR, WITH LYMPH NODES PRESENT Specimen, including laterality and lymph node sampling (sentinel, non-sentinel): Right partial breast with right axillary sentinel lymph node sampling. Procedure: Right breast lumpectomy with right axillary sentinel lymph node mapping and biopsy. Histologic type: Invasive ductal carcinoma. Grade: II. Tubule formation: 3. Nuclear pleomorphism: 2.  Mitotic:1. Tumor size (gross measurement and microscopic measurement): 0.9 cm. 1 of 4 FINAL for ARLINDA, BARCELONA ([id number]) Microscopic Comment(continued) Margins: Invasive, distance to closest margin: 0.4 cm (medial margin). In-situ, distance to closest margin: At least 0.4 cm (medial margin). Lymphovascular invasion: Definitive lymph/vascular invasion is not identified. Ductal carcinoma in situ: Yes, present. Grade: Intermediate grade. Extensive intraductal component: No. Lobular neoplasia: Not identified. Tumor focality: Unifocal. Treatment effect: Not applicable. Extent of tumor: Tumor confined to breast  parenchyma. Skin: Not received. Nipple: Not received. Skeletal muscle: Not received. Lymph nodes: Examined: 5 Sentinel. 5 Non-sentinel. 5 Total. Lymph nodes with metastasis: 0. Isolated tumor cells (< 0.2 mm): 0. Micrometastasis: (> 0.2 mm and < 2.0 mm): 0. Macrometastasis: (> 2.0 mm): 0. Extracapsular extension: Not applicable. Breast prognostic profile: Performed on previous biopsy ([id number]) Estrogen receptor: 98%, positive. Progesterone receptor: 81%, positive. Her 2 neu by CISH: 1.24 ratio, not amplified. Ki-67: 15% Non-neoplastic breast: Fat necrosis. TNM: pT1b, pN0.  ONCOTYPE DX RS score: 21    RADIOGRAPHIC STUDIES: I have personally reviewed the radiological images as listed and agreed with the findings in the report.  Mm DIAG Breast Tomo Bilateral 12/13/2015 IMPRESSION: No mammographic evidence of malignancy. RECOMMENDATION: Annual diagnostic mammography.  ASSESSMENT & PLAN:  61 y.o. postmenopausal woman, with past medical history of coronary disease, anxiety and depression, who was found by screening mammogram to have a T1b N0 M0 stage I a invasive ductal carcinoma, grade I-II, ER/PR strongly positive, HER-2 negative, Ki-67 10%.  1.  pT1b N0 M0 stage IA invasive ductal carcinoma of right breast, grade II, ER/PR strongly positive, HER-2 negative, Oncotype RS 21 -She is status post lumpectomy, and adjuvant breast radiation. -She declined adjuvant endocrine therapy, due to the concern of side effects. -We will continue the surveillance plan. She will see me every 6 months for the first few years, then annually afterwards.  -She is clinically doing well, exam was unremarkable today. Lab reviewed with her. Her last mammogram in 11/2015 was normal. No evidence of recurrence. -I encouraged her to have healthy diet and exercise regularly.  2. Osteopenia -She will continue calcium and vitamin D supplement. -last DEXA was in 03/2014, no high risk of fracture. I  encourage her to repeat DEXA in the next few months   3. Genetics -Given her strong family history of ovarian cancer, she was seen by genetic counselor -Negative BRCAPlus testing from [employer].  Genes tested include: BRCA1, BRCA2, CDH1, PTEN, PALB2, and TP53.  Report date is 01/25/14.   BRIP1 c.4921G>C VUS found on the CancerNext panel.  The CancerNext gene panel offered by [facility] and includes sequencing and rearrangement analysis for the following 32 genes:   APC, ATM, BARD1, BMPR1A, BRCA1, BRCA2, BRIP1, CDH1, CDK4, CDKN2A, CHEK2, EPCAM, GREM1, MLH1, MRE11A, MSH2, MSH6, MUTYH, NBN, NF1, PALB2, PMS2, POLD1, POLE, PTEN, RAD50, RAD51D, SMAD4, SMARCA4, STK11, and TP53.  The report date is: February 13, 2014.  4. Right upper arm pain -her x-ray of right humerus in April 2016 showed a 1.3 cm sclerotic lesion, possible enchondroma. -she will follow up with orthopedic surgeon Dr. Berenice Primas, she had right shoulder surgery in November 2017  5. Mild hyperbilirubinemia  -She has had mild intermittent elevated total bilirubin since 01/2015, liver enzymes are normal -Bilirubin is 1.65 TODAY. I have ordered an Abdominal Ultrasound to investigate this.  -Encouraged the patient to drink more fluids. Advised the patient not to drink alcohol. -We'll continue follow.  -She will contact our office if she  notices dark urine or jaundiced eyes.  6. She'll continue follow-up with her primary care physician and cardiologist for her other medical problems.  Plan - I'll see her back in 3 months for lab and close  follow up.  -I have set her up for an Abdominal Ultrasound to investigate elevated bilirubin in the next few weeks, I will call her after the [place] to review the results   All questions were answered. The patient knows to call the clinic with any problems, questions or concerns.  I spent 20 minutes counseling the patient face to face. The total time spent in the appointment was 30 minutes and more than  50% was on counseling.  This document serves as a record of services personally performed by Truitt Merle, MD. It was created on her behalf by Arlyce Harman, a trained medical scribe. The creation of this record is based on the scribe's personal observations and the provider's statements to them. This document has been checked and approved by the attending provider.     Truitt Merle, MD 05/16/2016

## 2016-05-15 DIAGNOSIS — E559 Vitamin D deficiency, unspecified: Secondary | ICD-10-CM | POA: Diagnosis not present

## 2016-05-15 DIAGNOSIS — E890 Postprocedural hypothyroidism: Secondary | ICD-10-CM | POA: Diagnosis not present

## 2016-05-16 ENCOUNTER — Telehealth: Payer: Self-pay | Admitting: Hematology

## 2016-05-16 ENCOUNTER — Encounter: Payer: Self-pay | Admitting: Hematology

## 2016-05-16 ENCOUNTER — Other Ambulatory Visit (HOSPITAL_BASED_OUTPATIENT_CLINIC_OR_DEPARTMENT_OTHER): Payer: Medicare Other

## 2016-05-16 ENCOUNTER — Ambulatory Visit (HOSPITAL_BASED_OUTPATIENT_CLINIC_OR_DEPARTMENT_OTHER): Payer: Medicare Other | Admitting: Hematology

## 2016-05-16 VITALS — BP 120/67 | HR 60 | Temp 98.6°F | Resp 18 | Ht 68.0 in | Wt 207.9 lb

## 2016-05-16 DIAGNOSIS — M8580 Other specified disorders of bone density and structure, unspecified site: Secondary | ICD-10-CM

## 2016-05-16 DIAGNOSIS — M85851 Other specified disorders of bone density and structure, right thigh: Secondary | ICD-10-CM

## 2016-05-16 DIAGNOSIS — M79621 Pain in right upper arm: Secondary | ICD-10-CM | POA: Diagnosis not present

## 2016-05-16 DIAGNOSIS — E559 Vitamin D deficiency, unspecified: Secondary | ICD-10-CM

## 2016-05-16 DIAGNOSIS — C50411 Malignant neoplasm of upper-outer quadrant of right female breast: Secondary | ICD-10-CM

## 2016-05-16 DIAGNOSIS — Z170 Estrogen receptor positive status [ER+]: Secondary | ICD-10-CM

## 2016-05-16 LAB — CBC WITH DIFFERENTIAL/PLATELET
BASO%: 0.6 % (ref 0.0–2.0)
Basophils Absolute: 0 10*3/uL (ref 0.0–0.1)
EOS%: 2.6 % (ref 0.0–7.0)
Eosinophils Absolute: 0.1 10*3/uL (ref 0.0–0.5)
HCT: 39.6 % (ref 34.8–46.6)
HGB: 13.7 g/dL (ref 11.6–15.9)
LYMPH%: 27.9 % (ref 14.0–49.7)
MCH: 30.7 pg (ref 25.1–34.0)
MCHC: 34.5 g/dL (ref 31.5–36.0)
MCV: 89 fL (ref 79.5–101.0)
MONO#: 0.6 10*3/uL (ref 0.1–0.9)
MONO%: 9.7 % (ref 0.0–14.0)
NEUT%: 59.2 % (ref 38.4–76.8)
NEUTROS ABS: 3.4 10*3/uL (ref 1.5–6.5)
Platelets: 187 10*3/uL (ref 145–400)
RBC: 4.45 10*6/uL (ref 3.70–5.45)
RDW: 13.6 % (ref 11.2–14.5)
WBC: 5.7 10*3/uL (ref 3.9–10.3)
lymph#: 1.6 10*3/uL (ref 0.9–3.3)

## 2016-05-16 LAB — COMPREHENSIVE METABOLIC PANEL
ALT: 37 U/L (ref 0–55)
AST: 31 U/L (ref 5–34)
Albumin: 4.1 g/dL (ref 3.5–5.0)
Alkaline Phosphatase: 61 U/L (ref 40–150)
Anion Gap: 10 mEq/L (ref 3–11)
BILIRUBIN TOTAL: 1.65 mg/dL — AB (ref 0.20–1.20)
BUN: 18.8 mg/dL (ref 7.0–26.0)
CO2: 24 meq/L (ref 22–29)
CREATININE: 1.3 mg/dL — AB (ref 0.6–1.1)
Calcium: 10.1 mg/dL (ref 8.4–10.4)
Chloride: 109 mEq/L (ref 98–109)
EGFR: 45 mL/min/{1.73_m2} — ABNORMAL LOW (ref >90)
GLUCOSE: 130 mg/dL (ref 70–140)
Potassium: 4.8 mEq/L (ref 3.5–5.1)
SODIUM: 144 meq/L (ref 136–145)
TOTAL PROTEIN: 7.3 g/dL (ref 6.4–8.3)

## 2016-05-16 NOTE — Telephone Encounter (Signed)
Appointments scheduled per 05/16/16 los. Patient was given a copy of the AVS report and appointment schedule per 05/16/16 los.

## 2016-05-18 DIAGNOSIS — M8580 Other specified disorders of bone density and structure, unspecified site: Secondary | ICD-10-CM | POA: Insufficient documentation

## 2016-05-20 DIAGNOSIS — M25511 Pain in right shoulder: Secondary | ICD-10-CM | POA: Diagnosis not present

## 2016-05-22 DIAGNOSIS — E890 Postprocedural hypothyroidism: Secondary | ICD-10-CM | POA: Diagnosis not present

## 2016-05-22 DIAGNOSIS — E559 Vitamin D deficiency, unspecified: Secondary | ICD-10-CM | POA: Diagnosis not present

## 2016-05-28 NOTE — Telephone Encounter (Signed)
Scheduled 06/04/16

## 2016-05-30 ENCOUNTER — Ambulatory Visit (HOSPITAL_COMMUNITY)
Admission: RE | Admit: 2016-05-30 | Discharge: 2016-05-30 | Disposition: A | Discharge status: Home / Self Care | Payer: Medicare Other | Source: Ambulatory Visit | Attending: Hematology | Admitting: Hematology

## 2016-05-30 DIAGNOSIS — Z9049 Acquired absence of other specified parts of digestive tract: Secondary | ICD-10-CM | POA: Diagnosis not present

## 2016-05-30 DIAGNOSIS — C50411 Malignant neoplasm of upper-outer quadrant of right female breast: Secondary | ICD-10-CM

## 2016-05-30 DIAGNOSIS — Z170 Estrogen receptor positive status [ER+]: Secondary | ICD-10-CM | POA: Diagnosis not present

## 2016-06-03 ENCOUNTER — Telehealth: Payer: Self-pay | Admitting: Family Medicine

## 2016-06-03 DIAGNOSIS — N289 Disorder of kidney and ureter, unspecified: Secondary | ICD-10-CM

## 2016-06-03 DIAGNOSIS — F331 Major depressive disorder, recurrent, moderate: Secondary | ICD-10-CM | POA: Diagnosis not present

## 2016-06-03 DIAGNOSIS — E785 Hyperlipidemia, unspecified: Secondary | ICD-10-CM

## 2016-06-03 DIAGNOSIS — E559 Vitamin D deficiency, unspecified: Secondary | ICD-10-CM

## 2016-06-03 DIAGNOSIS — E039 Hypothyroidism, unspecified: Secondary | ICD-10-CM

## 2016-06-03 NOTE — Telephone Encounter (Signed)
-----   Message from Eustace Pen, LPN sent at 04/30/100 10:34 AM EDT ----- Regarding: Labs 5/9 Please place lab orders. If none, please advise.   Traditional Medicare

## 2016-06-04 ENCOUNTER — Ambulatory Visit (INDEPENDENT_AMBULATORY_CARE_PROVIDER_SITE_OTHER): Payer: Medicare Other

## 2016-06-04 VITALS — BP 110/70 | HR 64 | Temp 97.9°F | Ht 66.5 in | Wt 206.5 lb

## 2016-06-04 DIAGNOSIS — Z0000 Encounter for general adult medical examination without abnormal findings: Secondary | ICD-10-CM

## 2016-06-04 DIAGNOSIS — N289 Disorder of kidney and ureter, unspecified: Secondary | ICD-10-CM

## 2016-06-04 DIAGNOSIS — E039 Hypothyroidism, unspecified: Secondary | ICD-10-CM

## 2016-06-04 DIAGNOSIS — E559 Vitamin D deficiency, unspecified: Secondary | ICD-10-CM

## 2016-06-04 DIAGNOSIS — E785 Hyperlipidemia, unspecified: Secondary | ICD-10-CM

## 2016-06-04 LAB — COMPREHENSIVE METABOLIC PANEL
ALT: 26 U/L (ref 0–35)
AST: 24 U/L (ref 0–37)
Albumin: 4.5 g/dL (ref 3.5–5.2)
Alkaline Phosphatase: 60 U/L (ref 39–117)
BUN: 20 mg/dL (ref 6–23)
CO2: 30 mEq/L (ref 19–32)
Calcium: 10.1 mg/dL (ref 8.4–10.5)
Chloride: 106 mEq/L (ref 96–112)
Creatinine, Ser: 1.38 mg/dL — ABNORMAL HIGH (ref 0.40–1.20)
GFR: 41.19 mL/min — ABNORMAL LOW (ref >60.00)
Glucose, Bld: 132 mg/dL — ABNORMAL HIGH (ref 70–99)
Potassium: 4.6 mEq/L (ref 3.5–5.1)
Sodium: 138 mEq/L (ref 135–145)
Total Bilirubin: 1.5 mg/dL — ABNORMAL HIGH (ref 0.2–1.2)
Total Protein: 7.3 g/dL (ref 6.0–8.3)

## 2016-06-04 LAB — LIPID PANEL
Cholesterol: 143 mg/dL (ref 0–200)
HDL: 50.3 mg/dL (ref >39.00)
LDL Cholesterol: 68 mg/dL (ref 0–99)
NonHDL: 92.4
Total CHOL/HDL Ratio: 3
Triglycerides: 121 mg/dL (ref 0.0–149.0)
VLDL: 24.2 mg/dL (ref 0.0–40.0)

## 2016-06-04 LAB — VITAMIN D 25 HYDROXY (VIT D DEFICIENCY, FRACTURES): VITD: 42.35 ng/mL (ref 30.00–100.00)

## 2016-06-04 NOTE — Patient Instructions (Signed)
Ms. Heaphy , Thank you for taking time to come for your Medicare Wellness Visit. I appreciate your ongoing commitment to your health goals. Please review the following plan we discussed and let me know if I can assist you in the future.   These are the goals we discussed: Goals    . Increase physical activity          Starting 06/04/2016, I will continue to walk on treadmill for 30 min 5 days per week.        This is a list of the screening recommended for you and due dates:  Health Maintenance  Topic Date Due  . Tetanus Vaccine  06/04/2016*  . Pap Smear  06/05/2026*  . Flu Shot  08/27/2016  . Mammogram  12/12/2016  . Colon Cancer Screening  10/04/2018  .  Hepatitis C: One time screening is recommended by Center for Disease Control  (CDC) for  adults born from [birth year] through 1966.   Completed  . HIV Screening  Completed  *Topic was postponed. The date shown is not the original due date.   Preventive Care for Adults  A healthy lifestyle and preventive care can promote health and wellness. Preventive health guidelines for adults include the following key practices.  . A routine yearly physical is a good way to check with your health care provider about your health and preventive screening. It is a chance to share any concerns and updates on your health and to receive a thorough exam.  . Visit your dentist for a routine exam and preventive care every 6 months. Brush your teeth twice a day and floss once a day. Good oral hygiene prevents tooth decay and gum disease.  . The frequency of eye exams is based on your age, health, family medical history, use  of contact lenses, and other factors. Follow your health care provider's ecommendations for frequency of eye exams.  . Eat a healthy diet. Foods like vegetables, fruits, whole grains, low-fat dairy products, and lean protein foods contain the nutrients you need without too many calories. Decrease your intake of foods high in solid fats,  added sugars, and salt. Eat the right amount of calories for you. Get information about a proper diet from your health care provider, if necessary.  . Regular physical exercise is one of the most important things you can do for your health. Most adults should get at least 150 minutes of moderate-intensity exercise (any activity that increases your heart rate and causes you to sweat) each week. In addition, most adults need muscle-strengthening exercises on 2 or more days a week.  Silver Sneakers may be a benefit available to you. To determine eligibility, you may visit the website: www.silversneakers.com or contact program at [phone] Mon-Fri between 8AM-8PM.   . Maintain a healthy weight. The body mass index (BMI) is a screening tool to identify possible weight problems. It provides an estimate of body fat based on height and weight. Your health care provider can find your BMI and can help you achieve or maintain a healthy weight.   For adults 20 years and older: ? A BMI below 18.5 is considered underweight. ? A BMI of 18.5 to 24.9 is normal. ? A BMI of 25 to 29.9 is considered overweight. ? A BMI of 30 and above is considered obese.   . Maintain normal blood lipids and cholesterol levels by exercising and minimizing your intake of saturated fat. Eat a balanced diet with plenty of fruit and vegetables.  Blood tests for lipids and cholesterol should begin at age 38 and be repeated every 5 years. If your lipid or cholesterol levels are high, you are over 50, or you are at high risk for heart disease, you may need your cholesterol levels checked more frequently. Ongoing high lipid and cholesterol levels should be treated with medicines if diet and exercise are not working.  . If you smoke, find out from your health care provider how to quit. If you do not use tobacco, please do not start.  . If you choose to drink alcohol, please do not consume more than 2 drinks per day. One drink is  considered to be 12 ounces (355 mL) of beer, 5 ounces (148 mL) of wine, or 1.5 ounces (44 mL) of liquor.  . If you are 61-66 years old, ask your health care provider if you should take aspirin to prevent strokes.  . Use sunscreen. Apply sunscreen liberally and repeatedly throughout the day. You should seek shade when your shadow is shorter than you. Protect yourself by wearing long sleeves, pants, a wide-brimmed hat, and sunglasses year round, whenever you are outdoors.  . Once a month, do a whole body skin exam, using a mirror to look at the skin on your back. Tell your health care provider of new moles, moles that have irregular borders, moles that are larger than a pencil eraser, or moles that have changed in shape or color.

## 2016-06-04 NOTE — Progress Notes (Signed)
Subjective:   [name] [name] is a 61 y.o. female who presents for an Initial Medicare Annual Wellness Visit.  Review of Systems    N/A  Cardiac Risk Factors include: advanced age (>65mn, >>69women);obesity (BMI >30kg/m2);dyslipidemia     Objective:    Today's Vitals   06/04/16 1449 06/04/16 1450  BP: 110/70   Pulse: 64   Temp: 97.9 F (36.6 C)   TempSrc: Oral   SpO2: 98%   Weight: 206 lb 8 oz (93.7 kg)   Height: 5' 6.5" (1.689 m)   PainSc: 3  0-No pain  PainLoc: Back    Body mass index is 32.83 kg/m.   Current Medications (verified) Outpatient Encounter Prescriptions as of 06/04/2016  Medication Sig  . aspirin 81 MG tablet Take 81 mg by mouth daily.  . cephALEXin (KEFLEX) 250 MG capsule Take 250 mg by mouth at bedtime.   .Marland KitchenCINNAMON PO Take 1,000 mg by mouth daily.   . Coenzyme Q10 (CO Q-10) 200 MG CAPS Take 1 capsule by mouth daily.    . fish oil-omega-3 fatty acids 1000 MG capsule Take 1.2 g by mouth 2 (two) times daily.  . furosemide (LASIX) 20 MG tablet TAKE 1 TABLET (20 MG TOTAL) BY MOUTH DAILY.  .Marland Kitchengabapentin (NEURONTIN) 300 MG capsule One capsule in the morning and 2 in the evening  . Ginkgo Biloba 120 MG CAPS Take 1 capsule by mouth daily.    .Marland KitchenKLOR-CON M20 20 MEQ tablet TAKE 2 TABLETS BY MOUTH TWICE A DAY AND 1/2 TABLET AT BEDTIME  . levothyroxine (SYNTHROID, LEVOTHROID) 137 MCG tablet Take 125 mcg by mouth daily before breakfast.   . Magnesium 250 MG TABS Take 250 mg by mouth daily.  . Multiple Vitamin (MULTIVITAMIN) capsule Take 1 capsule by mouth daily.  . nitroGLYCERIN (NITROSTAT) 0.4 MG SL tablet Place 1 tablet (0.4 mg total) under the tongue every 5 (five) minutes as needed. Pain  . pantoprazole (PROTONIX) 40 MG tablet TAKE 1 TABLET (40 MG TOTAL) BY MOUTH 2 (TWO) TIMES DAILY.  .Marland KitchenPAZEO 0.7 % SOLN Apply 1 drop to eye daily. 1 drop each eye daily.  . propranolol ER (INDERAL LA) 60 MG 24 hr capsule TAKE 1 CAPSULE (60 MG TOTAL) BY MOUTH AT BEDTIME.  .  ranitidine (ZANTAC) 300 MG tablet TAKE 1 TABLET (300 MG TOTAL) BY MOUTH 2 (TWO) TIMES DAILY.  . Riboflavin 400 MG TABS Take 400 mg by mouth daily.  . simvastatin (ZOCOR) 40 MG tablet TAKE ONE TABLET BY MOUTH AT BEDTIME  . topiramate (TOPAMAX) 50 MG tablet TAKE ONE TABLET IN THE MORNING AND 2 IN THE EVENING   No facility-administered encounter medications on file as of 06/04/2016.     Allergies (verified) Iohexol   History: Past Medical History:  Diagnosis Date  . Anorexia   . Anxiety   . Arrhythmia   . [place]+/PR+/Her2-  . Concussion 05/26/14  . Depression   . Frequent UTI   . GERD (gastroesophageal reflux disease)   . Hypothyroid   . Microscopic hematuria   . Migraine   . Radiation 03/22/14-04/19/14   Breast Cancer upper-outer  . Radiation 03/2014   right breast  . Restless leg syndrome   . Seasonal allergies   . Stented coronary artery    LAD 95% 2007.  . Vitamin D deficiency   . Wears glasses    Past Surgical History:  Procedure Laterality Date  . ABDOMINAL HYSTERECTOMY    .  APPENDECTOMY    . back surgery  1997   herniated disc repair  . CARDIAC CATHETERIZATION  2007   stent  . CHOLECYSTECTOMY OPEN    . CYSTOSCOPY     neg  . RADIOACTIVE SEED GUIDED MASTECTOMY WITH AXILLARY SENTINEL LYMPH NODE BIOPSY Right 02/14/2014   Procedure: RADIOACTIVE SEED GUIDED PARTIAL MASTECTOMY WITH AXILLARY SENTINEL LYMPH NODE BIOPSY;  Surgeon: Erroll Luna, MD;  Location: [facility];  Service: General;  Laterality: Right;  . SHOULDER SURGERY     Family History  Problem Relation Age of Onset  . Coronary artery disease Mother   . Thyroid disease Mother   . Alzheimer's disease Mother   . Diabetes Mother   . Heart disease Mother   . Cancer Brother     melnoma  . Ovarian cancer Paternal Aunt 30  . Bone cancer Paternal Uncle   . Alzheimer's disease Maternal Grandmother   . Heart disease Maternal Grandfather   . Pancreatic cancer Maternal  Grandfather 34  . Ovarian cancer Maternal Aunt     dx in 73s  . Throat cancer Maternal Uncle     smoker  . Lung cancer Maternal Uncle     heavy smoker  . Ovarian cancer Paternal Aunt     dx in her 66s  . Cancer Cousin     "female cancer"  . Cancer Father    Social History   Occupational History  . not working    Social History Main Topics  . Smoking status: Never Smoker  . Smokeless tobacco: Never Used  . Alcohol use No     Comment: socially  . Drug use: No  . Sexual activity: Not Currently    Tobacco Counseling Counseling given: No   Activities of Daily Living In your present state of health, do you have any difficulty performing the following activities: 06/04/2016  Hearing? Y  Vision? Y  Difficulty concentrating or making decisions? N  Walking or climbing stairs? N  Dressing or bathing? N  Doing errands, shopping? N  Preparing Food and eating ? N  Using the Toilet? N  In the past six months, have you accidently leaked urine? Y  Do you have problems with loss of bowel control? N  Managing your Medications? N  Managing your Finances? N  Housekeeping or managing your Housekeeping? N  Some recent data might be hidden    Immunizations and Health Maintenance Immunization History  Administered Date(s) Administered  . Influenza,inj,Quad PF,36+ Mos 11/15/2015  . Td 01/27/2006   There are no preventive care reminders to display for this patient.  Patient Care Team: Tower, Wynelle Fanny, MD as PCP - General Minus Breeding, MD as Consulting Physician (Cardiology) Holley Bouche, NP as Nurse Practitioner (Nurse Practitioner) Erroll Luna, MD as Consulting Physician (General Surgery) Thea Silversmith, MD (Inactive) as Consulting Physician (Radiation Oncology) Truitt Merle, MD as Consulting Physician (Hematology) Susa Day, MD as Consulting Physician (Orthopedic Surgery)     Assessment:   This is a routine wellness examination for [name] [name].   Hearing/Vision  screen  Hearing Screening   '125Hz'  '250Hz'  '500Hz'  '1000Hz'  '2000Hz'  '3000Hz'  '4000Hz'  '6000Hz'  '8000Hz'   Right ear:   40 40 0  0    Left ear:   0 40 0  0      Visual Acuity Screening   Right eye Left eye Both eyes  Without correction:     With correction: '20/70 20/40 20/50 '    Dietary issues and exercise activities discussed: Current Exercise  Habits: Home exercise routine, Type of exercise: treadmill, Time (Minutes): 30, Frequency (Times/Week): 7, Weekly Exercise (Minutes/Week): 210, Intensity: Moderate, Exercise limited by: None identified  Goals    . Increase physical activity          Starting 06/04/2016, I will continue to walk on treadmill for 30 min 5 days per week.       Depression Screen PHQ 2/9 Scores 06/04/2016  PHQ - 2 Score 1  PHQ- 9 Score 10    Fall Risk Fall Risk  06/04/2016 01/04/2014  Falls in the past year? Yes No  Number falls in past yr: 2 or more -  Injury with Fall? Yes -    Cognitive Function: MMSE - Mini Mental State Exam 06/04/2016  Orientation to time 5  Orientation to Place 5  Registration 3  Attention/ Calculation 0  Recall 3  Language- name 2 objects 0  Language- repeat 1  Language- follow 3 step command 3  Language- read & follow direction 0  Write a sentence 0  Copy design 0  Total score 20       PLEASE NOTE: A Mini-Cog screen was completed. Maximum score is 20. A value of 0 denotes this part of [place] MMSE was not completed or the patient failed this part of the Mini-Cog screening.   Mini-Cog Screening Orientation to Time - Max 5 pts Orientation to Place - Max 5 pts Registration - Max 3 pts Recall - Max 3 pts Language Repeat - Max 1 pts Language Follow 3 Step Command - Max 3 pts   Screening Tests Health Maintenance  Topic Date Due  . TETANUS/TDAP  06/04/2016 (Originally 01/28/2016)  . PAP SMEAR  06/05/2026 (Originally [birth year]/1975)  . INFLUENZA VACCINE  08/27/2016  . MAMMOGRAM  12/12/2016  . COLONOSCOPY  10/04/2018  . Hepatitis C Screening   Completed  . HIV Screening  Completed      Plan:   I have personally reviewed and addressed the Medicare Annual Wellness questionnaire and have noted the following in the patient's chart:  A. Medical and social history B. Use of alcohol, tobacco or illicit drugs  C. Current medications and supplements D. Functional ability and status E.  Nutritional status F.  Physical activity G. Advance directives H. List of other physicians I.  Hospitalizations, surgeries, and ER visits in previous 12 months J.  [place] to include hearing, vision, cognitive, depression L. Referrals and appointments - none  In addition, I have reviewed and discussed with patient certain preventive protocols, quality metrics, and best practice recommendations. A written personalized care plan for preventive services as well as general preventive health recommendations were provided to patient.  See attached scanned questionnaire for additional information.   Signed,   Lindell Noe, MHA, BS, LPN Health Coach

## 2016-06-04 NOTE — Progress Notes (Signed)
PCP notes:   Health maintenance:  Tetanus - postponed/insurance  Abnormal screenings:   Hearing - failed Fall risk - hx of multiple falls with injury Depression score: 10  Patient concerns:   Pt reports recent abdominal ultrasound in May 2018.   Pt reports pain in back, left hip, and right shoulder. Pain scale: 3/10.  Nurse concerns:  None  Next PCP appt:   I reviewed health advisor's note, was available for consultation, and agree with documentation and plan. Loura Pardon MD   06/24/16 @ [phone]

## 2016-06-04 NOTE — Progress Notes (Signed)
Pre visit review using our clinic review tool, if applicable. No additional management support is needed unless otherwise documented below in the visit note. 

## 2016-06-05 ENCOUNTER — Telehealth: Payer: Self-pay | Admitting: *Deleted

## 2016-06-05 LAB — TSH: TSH: 0.28 u[IU]/mL — AB (ref 0.35–4.50)

## 2016-06-05 NOTE — Telephone Encounter (Signed)
Received call from pt that someone called her maybe Monday.  Reviewed chart & result of U/S given per Dr Ernestina Penna message & informed to call if any problems before next visit.  Pt expressed understanding.

## 2016-06-05 NOTE — Telephone Encounter (Signed)
-----   Message from Truitt Merle, MD sent at 06/01/2016  8:43 PM EDT ----- Please call pt and let her know the [place] result. No concerns of liver metastasis, her mildly elevated tbili is probably related to fatty liver. Thanks  Truitt Merle 06/01/2016

## 2016-06-17 DIAGNOSIS — M25511 Pain in right shoulder: Secondary | ICD-10-CM | POA: Diagnosis not present

## 2016-06-19 ENCOUNTER — Other Ambulatory Visit: Payer: Self-pay | Admitting: Neurology

## 2016-06-19 ENCOUNTER — Other Ambulatory Visit: Payer: Self-pay | Admitting: Cardiology

## 2016-06-19 ENCOUNTER — Other Ambulatory Visit: Payer: Self-pay | Admitting: Family Medicine

## 2016-06-20 NOTE — Telephone Encounter (Signed)
REFILL 

## 2016-06-24 ENCOUNTER — Encounter: Payer: Self-pay | Admitting: Family Medicine

## 2016-06-24 ENCOUNTER — Ambulatory Visit (INDEPENDENT_AMBULATORY_CARE_PROVIDER_SITE_OTHER): Payer: Medicare Other | Admitting: Family Medicine

## 2016-06-24 VITALS — BP 104/62 | HR 65 | Temp 98.5°F | Ht 68.0 in | Wt 209.5 lb

## 2016-06-24 DIAGNOSIS — Z170 Estrogen receptor positive status [ER+]: Secondary | ICD-10-CM

## 2016-06-24 DIAGNOSIS — E785 Hyperlipidemia, unspecified: Secondary | ICD-10-CM

## 2016-06-24 DIAGNOSIS — M85851 Other specified disorders of bone density and structure, right thigh: Secondary | ICD-10-CM | POA: Diagnosis not present

## 2016-06-24 DIAGNOSIS — R7309 Other abnormal glucose: Secondary | ICD-10-CM | POA: Diagnosis not present

## 2016-06-24 DIAGNOSIS — E039 Hypothyroidism, unspecified: Secondary | ICD-10-CM | POA: Diagnosis not present

## 2016-06-24 DIAGNOSIS — E2839 Other primary ovarian failure: Secondary | ICD-10-CM

## 2016-06-24 DIAGNOSIS — N390 Urinary tract infection, site not specified: Secondary | ICD-10-CM | POA: Diagnosis not present

## 2016-06-24 DIAGNOSIS — C50411 Malignant neoplasm of upper-outer quadrant of right female breast: Secondary | ICD-10-CM | POA: Diagnosis not present

## 2016-06-24 DIAGNOSIS — E6609 Other obesity due to excess calories: Secondary | ICD-10-CM | POA: Diagnosis not present

## 2016-06-24 DIAGNOSIS — Z6831 Body mass index (BMI) 31.0-31.9, adult: Secondary | ICD-10-CM

## 2016-06-24 DIAGNOSIS — N289 Disorder of kidney and ureter, unspecified: Secondary | ICD-10-CM

## 2016-06-24 DIAGNOSIS — I2510 Atherosclerotic heart disease of native coronary artery without angina pectoris: Secondary | ICD-10-CM

## 2016-06-24 DIAGNOSIS — E559 Vitamin D deficiency, unspecified: Secondary | ICD-10-CM

## 2016-06-24 MED ORDER — FUROSEMIDE 20 MG PO TABS: ORAL_TABLET | ORAL | 3 refills | Status: DC

## 2016-06-24 MED ORDER — PANTOPRAZOLE SODIUM 40 MG PO TBEC: DELAYED_RELEASE_TABLET | ORAL | 3 refills | Status: DC

## 2016-06-24 MED ORDER — RANITIDINE HCL 300 MG PO TABS: 300.0000 mg | ORAL_TABLET | Freq: Two times a day (BID) | ORAL | 3 refills | Status: DC

## 2016-06-24 MED ORDER — POTASSIUM CHLORIDE CRYS ER 20 MEQ PO TBCR: EXTENDED_RELEASE_TABLET | ORAL | 3 refills | Status: DC

## 2016-06-24 NOTE — Assessment & Plan Note (Signed)
Continues keflex from urology Continue to monitor renal fx

## 2016-06-24 NOTE — Assessment & Plan Note (Signed)
Doing well with f/u  Declined anti-estrogen agents

## 2016-06-24 NOTE — Patient Instructions (Addendum)
Take a look at the material regarding tetanus shot   If you are interested in the new shingles vaccine (Shingrix) - call your insurance to check on coverage,( you should not get it within 1 month of other vaccines) , then call us for a prescription  for it to take to a pharmacy that gives the shot , or make a nurse visit to get it here depending on your coverage   Stop at check out for ref for bone density test   Keep drinking water for your kidney health  Make sure to keep up with your endocrinology appointments  Take your lab report with you   Keep walking!

## 2016-06-24 NOTE — Assessment & Plan Note (Signed)
Lab Results  Component Value Date   HGBA1C 5.8 (H) 03/28/2016   Watching prediabetes disc imp of low glycemic diet and wt loss to prevent DM2

## 2016-06-24 NOTE — Assessment & Plan Note (Signed)
Disc goals for lipids and reasons to control them Rev labs with pt Rev low sat fat diet in detail Controlled with simvastatin and diet 

## 2016-06-24 NOTE — Progress Notes (Signed)
Subjective:    Patient ID: [name] [name], female    DOB: 06/28/1955, 61 y.o.   MRN: [id number]  HPI Here for annual f/u of chronic health problems   Doing ok- good and bad days  Trying to take care of herself - trying to eat healthy  On the treadmill 30 minutes daily   Wt Readings from Last 3 Encounters:  06/24/16 209 lb 8 oz (95 kg)  06/04/16 206 lb 8 oz (93.7 kg)  05/16/16 207 lb 14.4 oz (94.3 kg)   bmi 31.8  AMW was 5/9 Hearing screen noted poor resp to 2000 and 4000 Hz in both ears - she notices this  She will d/w family - (she turns TV up loud)- may be interested in hearing aides  Vision is 20/50 both eyes  PHQ-2 score is 1 and PHQ 9 score is 10  (long time hx of depression)  She may be interested in shingrix in the future  Will check on coverage  Has had some falls in the past year  Thinks one fall was from medication that made her unsteady -off of it now  No broken bones   Tetanus vaccine 1/08-will also check on coverage   Mammogram 11/17-normal  Personal hx of breast cancer - doing very well with f/u  Self breast exam - no lumps   Colonoscopy 9/10 = polyp -10 year recall   dexa 3/16- osteopenia at femoral neck (done at [facility] hospital)  Vit D level is 42.3 Walking and taking vitamin D     Sees endocrinology for hypothyroidism It has been up and down  Lab Results  Component Value Date   TSH 0.28 (L) 06/04/2016    Hx of renal insuff in setting of frequent uti Lab Results  Component Value Date   CREATININE 1.38 (H) 06/04/2016   BUN 20 06/04/2016   NA 138 06/04/2016   K 4.6 06/04/2016   CL 106 06/04/2016   CO2 30 06/04/2016   she only drinks water - at least 64 oz per day  Still taking keflex for utis -doing fairly well   Hx of hyperlipidemia Lab Results  Component Value Date   CHOL 143 06/04/2016   CHOL 135 10/22/2015   CHOL 155 10/20/2014   Lab Results  Component Value Date   HDL 50.30 06/04/2016   HDL 47 10/22/2015   HDL 44 (L)  10/20/2014   Lab Results  Component Value Date   LDLCALC 68 06/04/2016   LDLCALC 67 10/22/2015   LDLCALC 74 10/20/2014   Lab Results  Component Value Date   TRIG 121.0 06/04/2016   TRIG 105 10/22/2015   TRIG 183 (H) 10/20/2014   Lab Results  Component Value Date   CHOLHDL 3 06/04/2016   CHOLHDL 2.9 10/22/2015   CHOLHDL 3.5 10/20/2014   No results found for: LDLDIRECT  simvastatin and diet  At goal   Hx of hyperglycemia Lab Results  Component Value Date   HGBA1C 5.8 (H) 03/28/2016  prediabetes  Stays away from sugar DM is in the family   Patient Active Problem List   Diagnosis Date Noted  . Estrogen deficiency 06/24/2016  . Osteopenia 05/18/2016  . Screening for HIV (human immunodeficiency virus) 12/13/2014  . Need for hepatitis C screening test 12/13/2014  . Intractable chronic migraine without aura and without status migrainosus 03/15/2014  . Genetic testing 01/25/2014  . Breast cancer of upper-outer quadrant of right female breast ([place]) 12/30/2013  . Hypothyroid 12/10/2011  . Adverse  effects of medication 10/28/2010  . Routine general medical examination at a health care facility 09/30/2010  . PERIODIC LIMB MOVEMENT DISORDER 08/16/2008  . HYPERSOMNIA 06/21/2008  . Somnolence, daytime 06/07/2008  . Vitamin D deficiency 04/07/2008  . Chronic UTI 11/05/2007  . Renal insufficiency 09/16/2007  . Other abnormal glucose 09/16/2007  . HEMATURIA, MICROSCOPIC, HX OF 09/16/2007  . OTHER ABNORMAL BLOOD CHEMISTRY 09/09/2007  . Hyperlipidemia 08/05/2007  . Obesity 08/05/2007  . DEPRESSIVE DISORDER 08/05/2007  . Intractable chronic migraine without aura 08/05/2007  . Coronary atherosclerosis 08/05/2007  . GERD 08/05/2007  . DEGENERATIVE DISC DISEASE, LUMBAR SPINE 08/05/2007  . EDEMA 08/05/2007   Past Medical History:  Diagnosis Date  . Anorexia   . Anxiety   . Arrhythmia   . Breast cancer ([place])    ER+/PR+/Her2-  . Concussion 05/26/14  . Depression   . Frequent  UTI   . GERD (gastroesophageal reflux disease)   . Hypothyroid   . Microscopic hematuria   . Migraine   . Radiation 03/22/14-04/19/14   Breast Cancer upper-outer  . Radiation 03/2014   right breast  . Restless leg syndrome   . Seasonal allergies   . Stented coronary artery    LAD 95% 2007.  . Vitamin D deficiency   . Wears glasses    Past Surgical History:  Procedure Laterality Date  . ABDOMINAL HYSTERECTOMY    . APPENDECTOMY    . back surgery  1997   herniated disc repair  . CARDIAC CATHETERIZATION  2007   stent  . CHOLECYSTECTOMY OPEN    . CYSTOSCOPY     neg  . RADIOACTIVE SEED GUIDED MASTECTOMY WITH AXILLARY SENTINEL LYMPH NODE BIOPSY Right 02/14/2014   Procedure: RADIOACTIVE SEED GUIDED PARTIAL MASTECTOMY WITH AXILLARY SENTINEL LYMPH NODE BIOPSY;  Surgeon: Erroll Luna, MD;  Location: [facility];  Service: General;  Laterality: Right;  . SHOULDER SURGERY     Social History  Substance Use Topics  . Smoking status: Never Smoker  . Smokeless tobacco: Never Used  . Alcohol use No     Comment: socially   Family History  Problem Relation Age of Onset  . Coronary artery disease Mother   . Thyroid disease Mother   . Alzheimer's disease Mother   . Diabetes Mother   . Heart disease Mother   . Cancer Brother        melnoma  . Ovarian cancer Paternal Aunt 9  . Bone cancer Paternal Uncle   . Alzheimer's disease Maternal Grandmother   . Heart disease Maternal Grandfather   . Pancreatic cancer Maternal Grandfather 12  . Ovarian cancer Maternal Aunt        dx in 65s  . Throat cancer Maternal Uncle        smoker  . Lung cancer Maternal Uncle        heavy smoker  . Ovarian cancer Paternal Aunt        dx in her 75s  . Cancer Cousin        "female cancer"  . Cancer Father    Allergies  Allergen Reactions  . Iohexol      Desc: throat selling resp distress hives.premedicate pt.entered 07/06/04, bsw.    Current Outpatient Prescriptions on File Prior to  Visit  Medication Sig Dispense Refill  . aspirin 81 MG tablet Take 81 mg by mouth daily.    . cephALEXin (KEFLEX) 250 MG capsule Take 250 mg by mouth at bedtime.     Marland Kitchen CINNAMON  PO Take 1,000 mg by mouth daily.     . Coenzyme Q10 (CO Q-10) 200 MG CAPS Take 1 capsule by mouth daily.      . fish oil-omega-3 fatty acids 1000 MG capsule Take 1.2 g by mouth 2 (two) times daily.    Marland Kitchen gabapentin (NEURONTIN) 300 MG capsule One capsule in the morning and 2 in the evening 270 capsule 1  . Ginkgo Biloba 120 MG CAPS Take 1 capsule by mouth daily.      Marland Kitchen levothyroxine (SYNTHROID, LEVOTHROID) 137 MCG tablet Take 125 mcg by mouth daily before breakfast.     . Magnesium 250 MG TABS Take 250 mg by mouth daily.    . Multiple Vitamin (MULTIVITAMIN) capsule Take 1 capsule by mouth daily.    . nitroGLYCERIN (NITROSTAT) 0.4 MG SL tablet Place 1 tablet (0.4 mg total) under the tongue every 5 (five) minutes as needed. Pain 25 tablet 3  . PAZEO 0.7 % SOLN Apply 1 drop to eye daily. 1 drop each eye daily.    . propranolol ER (INDERAL LA) 60 MG 24 hr capsule TAKE 1 CAPSULE (60 MG TOTAL) BY MOUTH AT BEDTIME. 90 capsule 3  . Riboflavin 400 MG TABS Take 400 mg by mouth daily.    . simvastatin (ZOCOR) 40 MG tablet TAKE ONE TABLET BY MOUTH AT BEDTIME 90 tablet 3  . topiramate (TOPAMAX) 50 MG tablet TAKE ONE TABLET IN THE MORNING AND 2 IN THE EVENING 270 tablet 3   No current facility-administered medications on file prior to visit.      Review of Systems  Constitutional: Positive for fatigue. Negative for activity change, appetite change, fever and unexpected weight change.  HENT: Negative for congestion, ear pain, rhinorrhea, sinus pressure and sore throat.   Eyes: Negative for pain, redness and visual disturbance.  Respiratory: Negative for cough, shortness of breath and wheezing.   Cardiovascular: Negative for chest pain and palpitations.  Gastrointestinal: Negative for abdominal pain, blood in stool, constipation and  diarrhea.  Endocrine: Negative for polydipsia and polyuria.  Genitourinary: Negative for dysuria, frequency and urgency.  Musculoskeletal: Negative for arthralgias, back pain and myalgias.  Skin: Negative for pallor and rash.  Allergic/Immunologic: Negative for environmental allergies.  Neurological: Positive for headaches. Negative for dizziness and syncope.  Hematological: Negative for adenopathy. Does not bruise/bleed easily.  Psychiatric/Behavioral: Positive for dysphoric mood. Negative for decreased concentration. The patient is not nervous/anxious.        Objective:   Physical Exam  Constitutional: She appears well-developed and well-nourished. No distress.  obese and well appearing   HENT:  Head: Normocephalic and atraumatic.  Right Ear: External ear normal.  Left Ear: External ear normal.  Nose: Nose normal.  Mouth/Throat: Oropharynx is clear and moist.  Eyes: Conjunctivae and EOM are normal. Pupils are equal, round, and reactive to light. Right eye exhibits no discharge. Left eye exhibits no discharge. No scleral icterus.  Neck: Normal range of motion. Neck supple. No JVD present. Carotid bruit is not present. No thyromegaly present.  Cardiovascular: Normal rate, regular rhythm, normal heart sounds and intact distal pulses.  Exam reveals no gallop.   Pulmonary/Chest: Effort normal and breath sounds normal. No respiratory distress. She has no wheezes. She has no rales.  Abdominal: Soft. Bowel sounds are normal. She exhibits no distension and no mass. There is no tenderness.  Genitourinary:  Genitourinary Comments: Pt has breast exams from her cancer/surgical team  Musculoskeletal: She exhibits no edema or tenderness.  Lymphadenopathy:  She has no cervical adenopathy.  Neurological: She is alert. She has normal reflexes. No cranial nerve deficit. She exhibits normal muscle tone. Coordination normal.  Skin: Skin is warm and dry. No rash noted. No erythema. No pallor.  Solar  lentigines diffusely   Psychiatric: She has a normal mood and affect.  Mood is better than usual today -pt states she is stable           Assessment & Plan:   Problem List Items Addressed This Visit      Endocrine   Hypothyroid - Primary    Lab Results  Component Value Date   TSH 0.28 (L) 06/04/2016   pt sees endocrinology-given copy of labs to take  Per pt -hard to regulate        Musculoskeletal and Integument   Osteopenia    Ref for 2 y f/u dexa Disc need for calcium/ vitamin D/ wt bearing exercise and bone density test every 2 y to monitor Disc safety/ fracture risk in detail          Genitourinary   Chronic UTI    Continues keflex from urology Continue to monitor renal fx      Renal insufficiency    Cr stays in 1.3 range  tx for chronic uti with keflex Disc imp of water intake- she gets 64 oz or more  Avoid nephrotoxic meds         Other   Breast cancer of upper-outer quadrant of right female breast ([place])    Doing well with f/u  Declined anti-estrogen agents       Estrogen deficiency   Relevant Orders   DG Bone Density   Hyperlipidemia    Disc goals for lipids and reasons to control them Rev labs with pt Rev low sat fat diet in detail Controlled with simvastatin and diet       Relevant Medications   furosemide (LASIX) 20 MG tablet   Obesity    Discussed how this problem influences overall health and the risks it imposes  Reviewed plan for weight loss with lower calorie diet (via better food choices and also portion control or program like weight watchers) and exercise building up to or more than 30 minutes 5 days per week including some aerobic activity   Pt working on it -commended for the effort       Other abnormal glucose    Lab Results  Component Value Date   HGBA1C 5.8 (H) 03/28/2016   Watching prediabetes disc imp of low glycemic diet and wt loss to prevent DM2       Vitamin D deficiency    Vitamin D level is therapeutic with  current supplementation Disc importance of this to bone and overall health

## 2016-06-24 NOTE — Assessment & Plan Note (Signed)
Discussed how this problem influences overall health and the risks it imposes  Reviewed plan for weight loss with lower calorie diet (via better food choices and also portion control or program like weight watchers) and exercise building up to or more than 30 minutes 5 days per week including some aerobic activity   Pt working on it -commended for the effort

## 2016-06-24 NOTE — Assessment & Plan Note (Signed)
Vitamin D level is therapeutic with current supplementation Disc importance of this to bone and overall health  

## 2016-06-24 NOTE — Assessment & Plan Note (Signed)
Lab Results  Component Value Date   TSH 0.28 (L) 06/04/2016   pt sees endocrinology-given copy of labs to take  Per pt -hard to regulate

## 2016-06-24 NOTE — Assessment & Plan Note (Signed)
Ref for 2 y f/u dexa Disc need for calcium/ vitamin D/ wt bearing exercise and bone density test every 2 y to monitor Disc safety/ fracture risk in detail

## 2016-06-24 NOTE — Assessment & Plan Note (Signed)
Cr stays in 1.3 range  tx for chronic uti with keflex Disc imp of water intake- she gets 64 oz or more  Avoid nephrotoxic meds

## 2016-07-01 ENCOUNTER — Ambulatory Visit (INDEPENDENT_AMBULATORY_CARE_PROVIDER_SITE_OTHER): Payer: Medicare Other | Admitting: Adult Health

## 2016-07-01 ENCOUNTER — Encounter: Payer: Self-pay | Admitting: Adult Health

## 2016-07-01 VITALS — BP 101/60 | HR 55 | Ht 68.0 in | Wt 211.8 lb

## 2016-07-01 DIAGNOSIS — G43709 Chronic migraine without aura, not intractable, without status migrainosus: Secondary | ICD-10-CM

## 2016-07-01 DIAGNOSIS — I2510 Atherosclerotic heart disease of native coronary artery without angina pectoris: Secondary | ICD-10-CM | POA: Diagnosis not present

## 2016-07-01 DIAGNOSIS — R51 Headache: Secondary | ICD-10-CM | POA: Diagnosis not present

## 2016-07-01 DIAGNOSIS — R519 Headache, unspecified: Secondary | ICD-10-CM

## 2016-07-01 MED ORDER — TOPIRAMATE 50 MG PO TABS: 100.0000 mg | ORAL_TABLET | Freq: Two times a day (BID) | ORAL | 11 refills | Status: DC

## 2016-07-01 NOTE — Patient Instructions (Signed)
Increase Topamax to 100mg  twice a day Continue gabapentin for now- may consider reducing dose in the future If your symptoms worsen or you develop new symptoms please let us know.

## 2016-07-01 NOTE — Progress Notes (Signed)
PATIENT: [name] [name] DOB: July 24, 1955  REASON FOR VISIT: follow up- migraines HISTORY FROM: patient  HISTORY OF PRESENT ILLNESS: [name] [name] is a 61 year old female with a history of headaches. She returns today for follow-up. She is currently taking gabapentin 300 mg in the morning and 600 mg in the evening as well as Topamax 50 mg in the morning and 100 mg in evening. She reports that she has daily headaches. Her headache occur anytime throughout the day. It typically occurs on the left side. Occasionally she will have photophobia and light nausea. She reports that these headaches, lasting anywhere from 2-4 hours. Typically she does take Aleve. Reports that she tries not to take it daily. She states that she can't remember the last time she had a true migraine. She states that sometimes she has the sensation like she may be developing migraine but it never comes. She did have a sleep study through another office and reports that it did not show apnea. I have not seen the results of this sleep study. She states that she feels gabapentin has caused some weight gain. She states currently she is trying to keep her thyroid levels in normal range. Reports that her endocrinologist feels that her weight gain may be causing her thyroid levels to fluctuate. She returns today for an evaluation.   HISTORY 03/03/16: [name] [name] is a 61 year old right-handed white female with a history of frequent headaches. The patient has just had some surgery on her right shoulder for a rotator cuff tear, she is on scheduled dosing of Percocet taking 4 tablets daily. The patient has had some improvement in her headache frequency, she was having daily headaches and she is now having 2 or 3 headaches a week. She may have one severe headache a week. She is on Topamax and gabapentin. She seems to be tolerating these medications fairly well. She remains in physical therapy for her right shoulder. She is not sleeping well at night in  part secondary to pain. She returns to this office for an evaluation. Her headaches are on the left side of the head and are going into the frontotemporal area and around the left eye.   REVIEW OF SYSTEMS: Out of a complete 14 system review of symptoms, the patient complains only of the following symptoms, and all other reviewed systems are negative.  Appetite change, hearing loss, runny nose, drooling, loss of vision, blurred vision, excessive eating, flushing, nausea, joint pain, back pain, muscle cramps, dizziness, headache, numbness, itching  ALLERGIES: Allergies  Allergen Reactions  . Iohexol      Desc: throat selling resp distress hives.premedicate pt.entered 07/06/04, bsw.     HOME MEDICATIONS: Outpatient Medications Prior to Visit  Medication Sig Dispense Refill  . aspirin 81 MG tablet Take 81 mg by mouth daily.    . cephALEXin (KEFLEX) 250 MG capsule Take 250 mg by mouth at bedtime.     Marland Kitchen CINNAMON PO Take 1,000 mg by mouth daily.     . Coenzyme Q10 (CO Q-10) 200 MG CAPS Take 1 capsule by mouth daily.      . fish oil-omega-3 fatty acids 1000 MG capsule Take 1.2 g by mouth 2 (two) times daily.    . furosemide (LASIX) 20 MG tablet TAKE 1 TABLET (20 MG TOTAL) BY MOUTH DAILY. 90 tablet 3  . gabapentin (NEURONTIN) 300 MG capsule One capsule in the morning and 2 in the evening 270 capsule 1  . Ginkgo Biloba 120 MG CAPS Take  1 capsule by mouth daily.      Marland Kitchen levothyroxine (SYNTHROID, LEVOTHROID) 137 MCG tablet Take 125 mcg by mouth daily before breakfast.     . Magnesium 250 MG TABS Take 250 mg by mouth daily.    . Multiple Vitamin (MULTIVITAMIN) capsule Take 1 capsule by mouth daily.    . nitroGLYCERIN (NITROSTAT) 0.4 MG SL tablet Place 1 tablet (0.4 mg total) under the tongue every 5 (five) minutes as needed. Pain 25 tablet 3  . pantoprazole (PROTONIX) 40 MG tablet TAKE 1 TABLET (40 MG TOTAL) BY MOUTH 2 (TWO) TIMES DAILY. 180 tablet 3  . PAZEO 0.7 % SOLN Apply 1 drop to eye daily. 1  drop each eye daily.    . potassium chloride SA (KLOR-CON M20) 20 MEQ tablet TAKE TWO TABLETS BY MOUTH TWICE DAILY AND ONE-HALF AT BEDTIME 405 tablet 3  . propranolol ER (INDERAL LA) 60 MG 24 hr capsule TAKE 1 CAPSULE (60 MG TOTAL) BY MOUTH AT BEDTIME. 90 capsule 3  . ranitidine (ZANTAC) 300 MG tablet Take 1 tablet (300 mg total) by mouth 2 (two) times daily. 180 tablet 3  . Riboflavin 400 MG TABS Take 400 mg by mouth daily.    . simvastatin (ZOCOR) 40 MG tablet TAKE ONE TABLET BY MOUTH AT BEDTIME 90 tablet 3  . topiramate (TOPAMAX) 50 MG tablet TAKE ONE TABLET IN THE MORNING AND 2 IN THE EVENING 270 tablet 3   No facility-administered medications prior to visit.     PAST MEDICAL HISTORY: Past Medical History:  Diagnosis Date  . Anorexia   . Anxiety   . Arrhythmia   . Breast cancer ([place])    ER+/PR+/Her2-  . Concussion 05/26/14  . Depression   . Frequent UTI   . GERD (gastroesophageal reflux disease)   . Hypothyroid   . Microscopic hematuria   . Migraine   . Radiation 03/22/14-04/19/14   Breast Cancer upper-outer  . Radiation 03/2014   right breast  . Restless leg syndrome   . Seasonal allergies   . Stented coronary artery    LAD 95% 2007.  . Vitamin D deficiency   . Wears glasses     PAST SURGICAL HISTORY: Past Surgical History:  Procedure Laterality Date  . ABDOMINAL HYSTERECTOMY    . APPENDECTOMY    . back surgery  1997   herniated disc repair  . CARDIAC CATHETERIZATION  2007   stent  . CHOLECYSTECTOMY OPEN    . CYSTOSCOPY     neg  . RADIOACTIVE SEED GUIDED MASTECTOMY WITH AXILLARY SENTINEL LYMPH NODE BIOPSY Right 02/14/2014   Procedure: RADIOACTIVE SEED GUIDED PARTIAL MASTECTOMY WITH AXILLARY SENTINEL LYMPH NODE BIOPSY;  Surgeon: Erroll Luna, MD;  Location: [facility];  Service: General;  Laterality: Right;  . SHOULDER SURGERY      FAMILY HISTORY: Family History  Problem Relation Age of Onset  . Coronary artery disease Mother   . Thyroid  disease Mother   . Alzheimer's disease Mother   . Diabetes Mother   . Heart disease Mother   . Cancer Brother        melnoma  . Ovarian cancer Paternal Aunt 24  . Bone cancer Paternal Uncle   . Alzheimer's disease Maternal Grandmother   . Heart disease Maternal Grandfather   . Pancreatic cancer Maternal Grandfather 39  . Ovarian cancer Maternal Aunt        dx in 31s  . Throat cancer Maternal Uncle  smoker  . Lung cancer Maternal Uncle        heavy smoker  . Ovarian cancer Paternal Aunt        dx in her 22s  . Cancer Cousin        "female cancer"  . Cancer Father     SOCIAL HISTORY: Social History   Social History  . Marital status: Widowed    Spouse name: N/A  . Number of children: 1  . Years of education: some coll.   Occupational History  . not working    Social History Main Topics  . Smoking status: Never Smoker  . Smokeless tobacco: Never Used  . Alcohol use No     Comment: socially  . Drug use: No  . Sexual activity: Not Currently   Other Topics Concern  . Not on file   Social History Narrative   Does not get any regular exercise      Doctors   -uro-[place]   -cardio--hochrein   -Neurology--Dr. Jannifer Franklin   -allergies--Dr. Carmelina Peal   Gyn past--Dr. Warnell Forester   Couselor--Dr. Mitchum   Endo-- Dr. Chalmers Cater      Lives with son who is 74 years old   Patient is right handed.   Patient drinks 1-2 cups of caffeine daily.      PHYSICAL EXAM  Vitals:   07/01/16 1512  BP: 101/60  Pulse: (!) 55  Weight: 211 lb 12.8 oz (96.1 kg)  Height: _0  (1.727 m)   There is no height or weight on file to calculate BMI.  Generalized: Well developed, in no acute distress   Neurological examination  Mentation: Alert oriented to time, place, history taking. Follows all commands speech and language fluent Cranial nerve II-XII: Pupils were equal round reactive to light. Extraocular movements were full, visual field were full on confrontational test. Facial  sensation and strength were normal. Uvula tongue midline. Head turning and shoulder shrug  were normal and symmetric. Motor: The motor testing reveals 5 over 5 strength of all 4 extremities. Good symmetric motor tone is noted throughout.  Sensory: Sensory testing is intact to soft touch on all 4 extremities. No evidence of extinction is noted.  Coordination: Cerebellar testing reveals good finger-nose-finger and heel-to-shin bilaterally.  Gait and station: Gait is normal. Tandem gait is normal. Romberg is negative. No drift is seen.  Reflexes: Deep tendon reflexes are symmetric and normal bilaterally.   DIAGNOSTIC DATA (LABS, IMAGING, TESTING) - I reviewed patient records, labs, notes, testing and imaging myself where available.  Lab Results  Component Value Date   WBC 5.7 05/16/2016   HGB 13.7 05/16/2016   HCT 39.6 05/16/2016   MCV 89.0 05/16/2016   PLT 187 05/16/2016      Component Value Date/Time   NA 138 06/04/2016 1519   NA 144 05/16/2016 1321   K 4.6 06/04/2016 1519   K 4.8 05/16/2016 1321   CL 106 06/04/2016 1519   CO2 30 06/04/2016 1519   CO2 24 05/16/2016 1321   GLUCOSE 132 (H) 06/04/2016 1519   GLUCOSE 130 05/16/2016 1321   BUN 20 06/04/2016 1519   BUN 18.8 05/16/2016 1321   CREATININE 1.38 (H) 06/04/2016 1519   CREATININE 1.3 (H) 05/16/2016 1321   CALCIUM 10.1 06/04/2016 1519   CALCIUM 10.1 05/16/2016 1321   PROT 7.3 06/04/2016 1519   PROT 7.3 05/16/2016 1321   ALBUMIN 4.5 06/04/2016 1519   ALBUMIN 4.1 05/16/2016 1321   AST 24 06/04/2016 1519   AST 31  05/16/2016 1321   ALT 26 06/04/2016 1519   ALT 37 05/16/2016 1321   ALKPHOS 60 06/04/2016 1519   ALKPHOS 61 05/16/2016 1321   BILITOT 1.5 (H) 06/04/2016 1519   BILITOT 1.65 (H) 05/16/2016 1321   GFRNONAA 47 (L) 02/13/2014 1100   GFRAA 54 (L) 02/13/2014 1100   Lab Results  Component Value Date   CHOL 143 06/04/2016   HDL 50.30 06/04/2016   LDLCALC 68 06/04/2016   TRIG 121.0 06/04/2016   CHOLHDL 3  06/04/2016   Lab Results  Component Value Date   HGBA1C 5.8 (H) 03/28/2016   No results found for: [id number] Lab Results  Component Value Date   TSH 0.28 (L) 06/04/2016      ASSESSMENT AND PLAN 61 y.o. year old female  has a past medical history of Anorexia; Anxiety; Arrhythmia; Breast cancer ([place]); Concussion (05/26/14); Depression; Frequent UTI; GERD (gastroesophageal reflux disease); Hypothyroid; Microscopic hematuria; Migraine; Radiation (03/22/14-04/19/14); Radiation (03/2014); Restless leg syndrome; Seasonal allergies; Stented coronary artery; Vitamin D deficiency; and Wears glasses. here with:  1. Migraine headaches  The patient reports that she has not had a true migraine in a while. She does have daily headaches. I will increase Topamax to 100 mg twice a day to see if this offers her any benefit for these headaches. She will continue on gabapentin however if Topamax is beneficial for her headaches we will try to slowly  wean her off of gabapentin in the future. Patient voiced understanding. She will follow-up in 6 months or sooner if needed.  I spent 15 minutes with the patient. 50% of this time was spent counseling the patient on her medications.      Ward Givens, MSN, NP-C 07/01/2016, 3:07 PM [facility] [address], [place], [place] [address] [phone]

## 2016-07-01 NOTE — Progress Notes (Signed)
I have read the note, and I agree with the clinical assessment and plan.  [name] [name]   

## 2016-07-02 ENCOUNTER — Ambulatory Visit
Admission: RE | Admit: 2016-07-02 | Discharge: 2016-07-02 | Disposition: A | Discharge status: Home / Self Care | Payer: Medicare Other | Source: Ambulatory Visit | Attending: Family Medicine | Admitting: Family Medicine

## 2016-07-02 DIAGNOSIS — E2839 Other primary ovarian failure: Secondary | ICD-10-CM

## 2016-07-02 DIAGNOSIS — Z780 Asymptomatic menopausal state: Secondary | ICD-10-CM | POA: Diagnosis not present

## 2016-07-02 DIAGNOSIS — M85862 Other specified disorders of bone density and structure, left lower leg: Secondary | ICD-10-CM | POA: Diagnosis not present

## 2016-07-21 DIAGNOSIS — E890 Postprocedural hypothyroidism: Secondary | ICD-10-CM | POA: Diagnosis not present

## 2016-07-29 DIAGNOSIS — F331 Major depressive disorder, recurrent, moderate: Secondary | ICD-10-CM | POA: Diagnosis not present

## 2016-08-14 NOTE — Progress Notes (Signed)
[name]  [phone] [phone]  Clinic Follow Up Note   Patient Care Team: Tower, Wynelle Fanny, MD as PCP - General Minus Breeding, MD as Consulting Physician (Cardiology) Holley Bouche, NP as Nurse Practitioner (Nurse Practitioner) Erroll Luna, MD as Consulting Physician (General Surgery) Thea Silversmith, MD (Inactive) as Consulting Physician (Radiation Oncology) Truitt Merle, MD as Consulting Physician (Hematology) Susa Day, MD as Consulting Physician (Orthopedic Surgery) 08/15/2016  CHIEF COMPLAINTS Follow up stage IA right breast cancer, pT1bN0M0    Breast cancer of upper-outer quadrant of right female breast ([place])   12/29/2013 Imaging    Screening mammogram and [place]: an irregular shadowing hypoechoic mass right breast 9 o'clock location 4 cm from the nipple measuring 7 x 7 x 7 mm. No adenopathy       12/29/2013 Initial Diagnosis    Breast cancer of upper-outer quadrant of right female breast      12/29/2013 Initial Biopsy    Grade I-II IDC, ER+ (98%), PR+ (81%), HER2- (ratio 1.24), Ki67 15%       01/11/2014 Procedure    Genetic counseling/testing: Revealed 1 VUS in ATM gene, p.D1641H (c.4921G>C).  Otherwise, genetic testing negative.       02/14/2014 Surgery    Right lumpectomy with SLNB ([facility]): Grade 2 IDC spanning 0.9 cm with associated grade 2 DCIS.  Negative margins. 5 sentinel lymph nodes negative.  HER2 repeated and neg. (ratio 1.16).      02/14/2014 Pathologic Stage    pT1bpN0; Stage IA      02/14/2014 Oncotype testing    Recurrence score 21 (13% risk of distant recurrence); no adjuvant chemo offered.       03/22/2014 - 04/19/2014 Radiation Therapy    Adjuvant radiation Pablo Ledger); Right breast: Total dose 42.72 Gy over 21 fractions. Right breast boost: Total dose 10 Gy over 5 fractions.        Anti-estrogen oral therapy    Anti-estrogen therapy was recommended by Dr. Burr Medico; pt declines anti-estrogen  treatment.        05/25/2014 Survivorship    Survivorship Care Plan given to patient and reviewed with her in-person.       05/30/2016 Imaging    US Abdomen 05/30/16 IMPRESSION: Status post cholecystectomy. Pancreas not visualized due to overlying bowel gas. Increased echogenicity of hepatic parenchyma is noted diffusely suggesting fatty infiltration or other diffuse hepatocellular disease.      07/02/2016 Imaging    DEXA 07/02/16 T score of -1.8, indicating osteopenia       HISTORY OF PRESENTING ILLNESS (01/04/2014):  [name] [name] 61 y.o. female presents to our breast multidisciplinary clinic today because of newly diagnosed breast cancer.   This was discovered by routine screening mammogram a few weeks ago. She underwent diagnostic mammogram and ultrasound on 12/29/2013 which showed an irregular shadowing hypoechoic mass in right breast likely position measuring 7 mm. No adenopathy. She underwent core needle biopsy on the same day, which showed grade 1-2 invasive ductal adenocarcinoma, ER positive PR positive and HER-2 negative, Ki-67 10%.  She has been feeling very anxious since the biopsy. She does have history of coronary artery disease, status post stent placement in [facility] 7. Since the breast cancer diagnosis, she has had several episodes of anxiety attack, hypoventilation, with chest tightness. She appeared quite nervous when I met her first in the clinic. Before the biopsy, she felt well in general, she does have intermittent migraine headaches, chronic leg swollen, intermittent chest pain, denies any other new  symptoms, no weight loss or other changes.  She lives with her only son, who is 49 years old, on disability due to depression and suicidal ideas. She is his caregiver.  CURRENT THERAPY: Surveillance  INTERIM HISTORY: She returns for follow up. The patient reports she is doing well. She denies any recent changes or complaints. She has started taking Lasix for bilateral lower  extremity edema. She performs routine self breast exams. She is taking Gabapentin for migraines; she reports this is causing her to gain weight.  MEDICAL HISTORY:  Past Medical History:  Diagnosis Date  . Anorexia   . Anxiety   . Arrhythmia   . Breast cancer ([place])    ER+/PR+/Her2-  . Concussion 05/26/14  . Depression   . Frequent UTI   . GERD (gastroesophageal reflux disease)   . Hypothyroid   . Microscopic hematuria   . Migraine   . Radiation 03/22/14-04/19/14   Breast Cancer upper-outer  . Radiation 03/2014   right breast  . Restless leg syndrome   . Seasonal allergies   . Stented coronary artery    LAD 95% 2007.  . Vitamin D deficiency   . Wears glasses    She has psychologist Dr. Rica Mote   SURGICAL HISTORY: Past Surgical History:  Procedure Laterality Date  . ABDOMINAL HYSTERECTOMY    . APPENDECTOMY    . back surgery  1997   herniated disc repair  . CARDIAC CATHETERIZATION  2007   stent  . CHOLECYSTECTOMY OPEN    . CYSTOSCOPY     neg  . RADIOACTIVE SEED GUIDED MASTECTOMY WITH AXILLARY SENTINEL LYMPH NODE BIOPSY Right 02/14/2014   Procedure: RADIOACTIVE SEED GUIDED PARTIAL MASTECTOMY WITH AXILLARY SENTINEL LYMPH NODE BIOPSY;  Surgeon: Erroll Luna, MD;  Location: [facility];  Service: General;  Laterality: Right;  . SHOULDER SURGERY     Back surgery in 1997   SOCIAL HISTORY: Social History   Social History  . Marital status: Widowed    Spouse name: N/A  . Number of children: 1  . Years of education: some coll.   Occupational History  . not working    Social History Main Topics  . Smoking status: Never Smoker  . Smokeless tobacco: Never Used  . Alcohol use No     Comment: socially  . Drug use: No  . Sexual activity: Not Currently   Other Topics Concern  . Not on file   Social History Narrative   Does not get any regular exercise      Doctors   -uro-[facility]   -cardio--hochrein   -Neurology--Dr. Jannifer Franklin   -allergies--Dr. Carmelina Peal     Gyn past--Dr. Warnell Forester   [place]--Dr. Mitchum   Endo-- Dr. Chalmers Cater      Lives with son who is 10 years old   Patient is right handed.   Patient drinks 1-2 cups of caffeine daily.    FAMILY HISTORY: Family History  Problem Relation Age of Onset  . Coronary artery disease Mother   . Thyroid disease Mother   . Alzheimer's disease Mother   . Diabetes Mother   . Heart disease Mother   . Cancer Brother        melnoma  . Ovarian cancer Paternal Aunt 75  . Bone cancer Paternal Uncle   . Alzheimer's disease Maternal Grandmother   . Heart disease Maternal Grandfather   . Pancreatic cancer Maternal Grandfather 65  . Ovarian cancer Maternal Aunt        dx in 78s  .  Throat cancer Maternal Uncle        smoker  . Lung cancer Maternal Uncle        heavy smoker  . Ovarian cancer Paternal Aunt        dx in her 19s  . Cancer Cousin        "female cancer"  . Cancer Father    GYN HISTORY  Menarchal: 10 LMP: 1983 hysrectomy  Contraceptive: 2 years  HRT: was on for 10-20 years, off several years  GxP1: several miscarriages    ALLERGIES:  is allergic to iohexol.  MEDICATIONS:  Allergies as of 08/15/2016      Reactions   Iohexol     Desc: throat selling resp distress hives.premedicate pt.entered 07/06/04, bsw.      Medication List       Accurate as of 08/15/16 11:50 PM. Always use your most recent med list.          aspirin 81 MG tablet Take 81 mg by mouth daily.   cephALEXin 250 MG capsule Commonly known as:  KEFLEX Take 250 mg by mouth at bedtime.   CINNAMON PO Take 1,000 mg by mouth daily.   Co Q-10 200 MG Caps Take 1 capsule by mouth daily.   fish oil-omega-3 fatty acids 1000 MG capsule Take 1.2 g by mouth 2 (two) times daily.   furosemide 20 MG tablet Commonly known as:  LASIX TAKE 1 TABLET (20 MG TOTAL) BY MOUTH DAILY.   gabapentin 300 MG capsule Commonly known as:  NEURONTIN One capsule in the morning and 2 in the evening   Ginkgo Biloba 120 MG  Caps Take 1 capsule by mouth daily.   levothyroxine 125 MCG tablet Commonly known as:  SYNTHROID, LEVOTHROID Take 125 mcg by mouth daily before breakfast.   Magnesium 250 MG Tabs Take 250 mg by mouth daily.   multivitamin capsule Take 1 capsule by mouth daily.   nitroGLYCERIN 0.4 MG SL tablet Commonly known as:  NITROSTAT Place 1 tablet (0.4 mg total) under the tongue every 5 (five) minutes as needed. Pain   pantoprazole 40 MG tablet Commonly known as:  PROTONIX TAKE 1 TABLET (40 MG TOTAL) BY MOUTH 2 (TWO) TIMES DAILY.   PAZEO 0.7 % Soln Generic drug:  Olopatadine HCl Apply 1 drop to eye daily. 1 drop each eye daily.   potassium chloride SA 20 MEQ tablet Commonly known as:  KLOR-CON M20 TAKE TWO TABLETS BY MOUTH TWICE DAILY AND ONE-HALF AT BEDTIME   propranolol ER 60 MG 24 hr capsule Commonly known as:  INDERAL LA TAKE 1 CAPSULE (60 MG TOTAL) BY MOUTH AT BEDTIME.   ranitidine 300 MG tablet Commonly known as:  ZANTAC Take 1 tablet (300 mg total) by mouth 2 (two) times daily.   Riboflavin 400 MG Tabs Take 400 mg by mouth daily.   simvastatin 40 MG tablet Commonly known as:  ZOCOR TAKE ONE TABLET BY MOUTH AT BEDTIME   topiramate 50 MG tablet Commonly known as:  TOPAMAX Take 2 tablets (100 mg total) by mouth 2 (two) times daily.       REVIEW OF SYSTEMS:  Constitutional: Denies fevers, chills or abnormal night sweats Eyes: Denies blurriness of vision, double vision or watery eyes Ears, nose, mouth, throat, and face: Denies mucositis or sore throat Respiratory: Denies cough, dyspnea or wheezes Cardiovascular: Negative Gastrointestinal:  Denies nausea, heartburn or change in bowel habits Skin: Denies abnormal skin rashes Lymphatics: Denies new lymphadenopathy or easy bruising Neurological:Denies numbness, tingling  or new weaknesses, (+) right upper arm pain, improved with surgery Behavioral/Psych: (+) very anxious All other systems were reviewed with the patient  and are negative.  PHYSICAL EXAMINATION: ECOG PERFORMANCE STATUS: 1  Vitals:   08/15/16 1346  BP: 114/66  Pulse: (!) 57  Resp: 18  Temp: (!) 96.8 F (36 C)   Filed Weights   08/15/16 1346  Weight: 209 lb 9.6 oz (95.1 kg)    GENERAL:alert, no distress and comfortable SKIN: skin color, texture, turgor are normal, no rashes or significant lesions EYES: normal, conjunctiva are pink and non-injected, sclera clear OROPHARYNX:no exudate, no erythema and lips, buccal mucosa, and tongue normal  NECK: supple, thyroid normal size, non-tender, without nodularity LYMPH:  no palpable lymphadenopathy in the cervical, axillary or inguinal LUNGS: clear to auscultation and percussion with normal breathing effort HEART: regular rate & rhythm and no murmurs and no lower extremity edema ABDOMEN:abdomen soft, non-tender and normal bowel sounds Musculoskeletal:no cyanosis of digits and no clubbing, no significant edema on all her extremities, no tenderness of the right shoulder or arm, the range of right shoulder is normal. PSYCH: alert & oriented x 3 with fluent speech NEURO: no focal motor/sensory deficits Breasts: Breast inspection showed them to be symmetrical with no nipple discharge. Palpation of the breasts and axilla revealed no obvious mass that I could appreciate. Surgical scar in the right breast is well-healed with some scar tissue underneath.    LABORATORY DATA:  I have reviewed the data as listed Lab Results  Component Value Date   WBC 5.7 08/15/2016   HGB 13.9 08/15/2016   HCT 40.0 08/15/2016   MCV 90.9 08/15/2016   PLT 164 08/15/2016    Recent Labs  10/22/15 1145  03/28/16 1638 05/16/16 1321 06/04/16 1519 08/15/16 1326  NA  --   < > 139 144 138 145  K  --   < > 4.7 4.8 4.6 4.8  CL  --   --  105  --  106  --   CO2  --   < > '24 24 30 22  ' GLUCOSE  --   < > 100* 130 132* 129  BUN  --   < > 19 18.8 20 15.4  CREATININE  --   < > 1.19* 1.3* 1.38* 1.5*  CALCIUM  --   < >  10.1 10.1 10.1 10.4  PROT 6.5  < > 7.0 7.3 7.3 7.5  ALBUMIN 4.3  < > 4.3 4.1 4.5 4.2  AST 31  < > 35 '31 24 28  ' ALT 33*  < > 37* 37 26 27  ALKPHOS 44  < > 55 61 60 58  BILITOT 1.1  < > 1.7* 1.65* 1.5* 1.62*  BILIDIR 0.2  --   --   --   --   --   IBILI 0.9  --   --   --   --   --   < > = values in this interval not displayed.  BILIRUBIN:     PATHOLOGY REPORT 12/30/2013 Diagnosis 1. Breast, lumpectomy, Right 02/14/2014 - INVASIVE GRADE II DUCTAL CARCINOMA SPANNING 0.9 CM IN GREATEST DIMENSION. - ASSOCIATED INTERMEDIATE GRADE DUCTAL CARCINOMA IN SITU. - MARGINS ARE NEGATIVE. - SEE ONCOLOGY TEMPLATE. 2. Breast, excision, Right - BENIGN BREAST PARENCHYMA WITH SCATTERED ACUTE AND CHRONIC INFLAMMATION. - NO ATYPIA, HYPERPLASIA, OR MALIGNANCY IDENTIFIED. 3. Lymph node, sentinel, biopsy, Right axillary - ONE BENIGN LYMPH NODE WITH NO TUMOR SEEN (0/1). 4. Lymph node, sentinel, biopsy, Right  axillary - ONE BENIGN LYMPH NODE WITH NO TUMOR SEEN (0/1). 5. Lymph node, sentinel, biopsy, Right axillary - ONE BENIGN LYMPH NODE WITH NO TUMOR SEEN (0/1). 6. Lymph node, sentinel, biopsy, Right axillary - ONE BENIGN LYMPH NODE WITH NO TUMOR SEEN (0/1). 7. Lymph node, sentinel, biopsy, Right axillary - ONE BENIGN LYMPH NODE WITH NO TUMOR SEEN (0/1). 1. BREAST, INVASIVE TUMOR, WITH LYMPH NODES PRESENT Specimen, including laterality and lymph node sampling (sentinel, non-sentinel): Right partial breast with right axillary sentinel lymph node sampling. Procedure: Right breast lumpectomy with right axillary sentinel lymph node mapping and biopsy. Histologic type: Invasive ductal carcinoma. Grade: II. Tubule formation: 3. Nuclear pleomorphism: 2. Mitotic:1. Tumor size (gross measurement and microscopic measurement): 0.9 cm. 1 of 4 FINAL for [name] [name], [name] [name] ([id number]) Microscopic Comment(continued) Margins: Invasive, distance to closest margin: 0.4 cm (medial margin). In-situ, distance to  closest margin: At least 0.4 cm (medial margin). Lymphovascular invasion: Definitive lymph/vascular invasion is not identified. Ductal carcinoma in situ: Yes, present. Grade: Intermediate grade. Extensive intraductal component: No. Lobular neoplasia: Not identified. Tumor focality: Unifocal. Treatment effect: Not applicable. Extent of tumor: Tumor confined to breast parenchyma. Skin: Not received. Nipple: Not received. Skeletal muscle: Not received. Lymph nodes: Examined: 5 Sentinel. 5 Non-sentinel. 5 Total. Lymph nodes with metastasis: 0. Isolated tumor cells (< 0.2 mm): 0. Micrometastasis: (> 0.2 mm and < 2.0 mm): 0. Macrometastasis: (> 2.0 mm): 0. Extracapsular extension: Not applicable. Breast prognostic profile: Performed on previous biopsy ([id number]) Estrogen receptor: 98%, positive. Progesterone receptor: 81%, positive. Her 2 neu by [facility]: 1.24 ratio, not amplified. Ki-67: 15% Non-neoplastic breast: Fat necrosis. TNM: pT1b, pN0.  ONCOTYPE DX RS score: 21    RADIOGRAPHIC STUDIES: I have personally reviewed the radiological images as listed and agreed with the findings in the report.  DEXA 07/02/16 T score of -1.8, indicating osteopenia  US Abdomen 05/30/16 IMPRESSION: Status post cholecystectomy. Pancreas not visualized due to overlying bowel gas. Increased echogenicity of hepatic parenchyma is noted diffusely suggesting fatty infiltration or other diffuse hepatocellular disease.  Mm DIAG Breast Tomo Bilateral 12/13/2015 IMPRESSION: No mammographic evidence of malignancy. RECOMMENDATION: Annual diagnostic mammography.  ASSESSMENT & PLAN:  61 y.o. postmenopausal woman, with past medical history of coronary disease, anxiety and depression, who was found by screening mammogram to have a T1b N0 M0 stage I a invasive ductal carcinoma, grade I-II, ER/PR strongly positive, HER-2 negative, Ki-67 10%.  1.  pT1b N0 M0 stage IA invasive ductal carcinoma of right breast,  grade II, ER/PR strongly positive, HER-2 negative, Oncotype RS 21 -She is status post lumpectomy, and adjuvant breast radiation. -She declined adjuvant endocrine therapy, due to the concern of side effects. -We will continue the surveillance plan. She will see me every 6 months for the first few years, then annually afterwards.  -She is clinically doing well, exam was unremarkable today. Lab reviewed with her. Her last mammogram in 11/2015 was normal. No evidence of recurrence. -I encouraged her to have healthy diet and exercise regularly. See walks on a treadmill for 30 minutes every day. - Due for mammogram in November 2018. I will order this today.  2. Osteopenia -She will continue calcium and vitamin D supplement. -last DEXA was in 06/2016, no high risk of fracture.  3. Genetics -Given her strong family history of ovarian cancer, she was seen by genetic counselor -Negative BRCAPlus testing from [employer].  Genes tested include: BRCA1, BRCA2, CDH1, PTEN, PALB2, and TP53.  Report date is 01/25/14.   BRIP1  c.4921G>C VUS found on the CancerNext panel.  The CancerNext gene panel offered by [facility] and includes sequencing and rearrangement analysis for the following 32 genes:   APC, ATM, BARD1, BMPR1A, BRCA1, BRCA2, BRIP1, CDH1, CDK4, CDKN2A, CHEK2, EPCAM, GREM1, MLH1, MRE11A, MSH2, MSH6, MUTYH, NBN, NF1, PALB2, PMS2, POLD1, POLE, PTEN, RAD50, RAD51D, SMAD4, SMARCA4, STK11, and TP53.  The report date is: February 13, 2014.  4. Right upper arm pain -her x-ray of right humerus in April 2016 showed a 1.3 cm sclerotic lesion, possible enchondroma. -she will follow up with orthopedic surgeon Dr. Berenice Primas, she had right shoulder surgery in November 2017  5. Mild hyperbilirubinemia  -She has had mild intermittent elevated total bilirubin since 01/2015, liver enzymes are normal -Bilirubin is 1.62 TODAY (08/15/16). I have previously ordered an Abdominal Ultrasound to investigate this, which showed  fatty liver. -Encouraged the patient to drink more fluids. Advised the patient not to drink alcohol. -We'll continue follow.  -She will contact our office if she notices dark urine or jaundiced eyes.  6. She'll continue follow-up with her primary care physician and cardiologist for her other medical problems.  Plan - Reviewed labs and [place] of abdomen with the patient today. - Continue calcium and vitamin D. - Due for mammogram in November. I ordered this today. - Follow up in 6 months with labs.   All questions were answered. The patient knows to call the clinic with any problems, questions or concerns.  I spent 20 minutes counseling the patient face to face. The total time spent in the appointment was 30 minutes and more than 50% was on counseling.  This document serves as a record of services personally performed by Truitt Merle, MD. It was created on her behalf by Maryla Morrow, a trained medical scribe. The creation of this record is based on the scribe's personal observations and the provider's statements to them. This document has been checked and approved by the attending provider.    Truitt Merle, MD 08/15/2016

## 2016-08-15 ENCOUNTER — Other Ambulatory Visit (HOSPITAL_BASED_OUTPATIENT_CLINIC_OR_DEPARTMENT_OTHER): Payer: Medicare Other

## 2016-08-15 ENCOUNTER — Telehealth: Payer: Self-pay | Admitting: Hematology

## 2016-08-15 ENCOUNTER — Ambulatory Visit (HOSPITAL_BASED_OUTPATIENT_CLINIC_OR_DEPARTMENT_OTHER): Payer: Medicare Other | Admitting: Hematology

## 2016-08-15 VITALS — BP 114/66 | HR 57 | Temp 96.8°F | Resp 18 | Ht 68.0 in | Wt 209.6 lb

## 2016-08-15 DIAGNOSIS — Z853 Personal history of malignant neoplasm of breast: Secondary | ICD-10-CM

## 2016-08-15 DIAGNOSIS — M8580 Other specified disorders of bone density and structure, unspecified site: Secondary | ICD-10-CM | POA: Diagnosis not present

## 2016-08-15 DIAGNOSIS — C50411 Malignant neoplasm of upper-outer quadrant of right female breast: Secondary | ICD-10-CM

## 2016-08-15 DIAGNOSIS — M79621 Pain in right upper arm: Secondary | ICD-10-CM

## 2016-08-15 DIAGNOSIS — Z170 Estrogen receptor positive status [ER+]: Principal | ICD-10-CM

## 2016-08-15 LAB — CBC WITH DIFFERENTIAL/PLATELET
BASO%: 0.4 % (ref 0.0–2.0)
BASOS ABS: 0 10*3/uL (ref 0.0–0.1)
EOS ABS: 0.2 10*3/uL (ref 0.0–0.5)
EOS%: 3 % (ref 0.0–7.0)
HEMATOCRIT: 40 % (ref 34.8–46.6)
HGB: 13.9 g/dL (ref 11.6–15.9)
LYMPH#: 1.4 10*3/uL (ref 0.9–3.3)
LYMPH%: 25.3 % (ref 14.0–49.7)
MCH: 31.6 pg (ref 25.1–34.0)
MCHC: 34.8 g/dL (ref 31.5–36.0)
MCV: 90.9 fL (ref 79.5–101.0)
MONO#: 0.4 10*3/uL (ref 0.1–0.9)
MONO%: 7.7 % (ref 0.0–14.0)
NEUT#: 3.6 10*3/uL (ref 1.5–6.5)
NEUT%: 63.6 % (ref 38.4–76.8)
Platelets: 164 10*3/uL (ref 145–400)
RBC: 4.4 10*6/uL (ref 3.70–5.45)
RDW: 13.6 % (ref 11.2–14.5)
WBC: 5.7 10*3/uL (ref 3.9–10.3)

## 2016-08-15 LAB — COMPREHENSIVE METABOLIC PANEL
ALBUMIN: 4.2 g/dL (ref 3.5–5.0)
ALK PHOS: 58 U/L (ref 40–150)
ALT: 27 U/L (ref 0–55)
AST: 28 U/L (ref 5–34)
Anion Gap: 9 mEq/L (ref 3–11)
BUN: 15.4 mg/dL (ref 7.0–26.0)
CALCIUM: 10.4 mg/dL (ref 8.4–10.4)
CHLORIDE: 114 meq/L — AB (ref 98–109)
CO2: 22 mEq/L (ref 22–29)
Creatinine: 1.5 mg/dL — ABNORMAL HIGH (ref 0.6–1.1)
EGFR: 37 mL/min/{1.73_m2} — AB (ref >90)
Glucose: 129 mg/dl (ref 70–140)
POTASSIUM: 4.8 meq/L (ref 3.5–5.1)
Sodium: 145 mEq/L (ref 136–145)
Total Bilirubin: 1.62 mg/dL — ABNORMAL HIGH (ref 0.20–1.20)
Total Protein: 7.5 g/dL (ref 6.4–8.3)

## 2016-08-15 NOTE — Telephone Encounter (Signed)
Scheduled appt per 7/20 los - Gave patient AVS and calender per los.  

## 2016-08-17 ENCOUNTER — Encounter: Payer: Self-pay | Admitting: Hematology

## 2016-08-26 DIAGNOSIS — F331 Major depressive disorder, recurrent, moderate: Secondary | ICD-10-CM | POA: Diagnosis not present

## 2016-09-10 ENCOUNTER — Telehealth: Payer: Self-pay | Admitting: Cardiology

## 2016-09-10 NOTE — Telephone Encounter (Signed)
New Message   *STAT* If patient is at the pharmacy, call can be transferred to refill team.   1. Which medications need to be refilled? (please list name of each medication and dose if known) Propranolol 60mg    2. Which pharmacy/location (including street and city if local pharmacy) is medication to be sent to? [address]  3. Do they need a 30 day or 90 day supply? [place]

## 2016-09-11 MED ORDER — PROPRANOLOL HCL ER 60 MG PO CP24: ORAL_CAPSULE | ORAL | 1 refills | Status: DC

## 2016-09-11 NOTE — Telephone Encounter (Signed)
RX SENT

## 2016-09-25 ENCOUNTER — Ambulatory Visit: Payer: Medicare Other | Admitting: Physician Assistant

## 2016-10-01 ENCOUNTER — Other Ambulatory Visit: Payer: Self-pay | Admitting: Neurology

## 2016-10-02 DIAGNOSIS — F331 Major depressive disorder, recurrent, moderate: Secondary | ICD-10-CM | POA: Diagnosis not present

## 2016-10-10 ENCOUNTER — Ambulatory Visit: Payer: Medicare Other | Admitting: Cardiology

## 2016-10-20 DIAGNOSIS — Z853 Personal history of malignant neoplasm of breast: Secondary | ICD-10-CM | POA: Diagnosis not present

## 2016-10-31 DIAGNOSIS — F331 Major depressive disorder, recurrent, moderate: Secondary | ICD-10-CM | POA: Diagnosis not present

## 2016-11-19 NOTE — Progress Notes (Signed)
HPI The patient presents for followup of her known coronary disease.  In 2017 she had a Lexiscan Myoview prior to shoulder surgery.  This was low risk. The patient reports that she's been having palpitations. These have been occurring sporadically. She says her heart rate goes fast and slow. She never bothered the [employer].  However, she ordered an Apple Watch and this will have soft that will allow her to take rhythm strips.  The software is not available yet. She says she's having the palpitations frequently. She feels lightheaded and dizzy with this. Sometimes when her heart is going fast and sometimes it is when it is going slow.  She has not had any frank syncope. She does get chest discomfort but it is somewhat vague. She really points to her upper epigastric area.  She can't remember whether this is similar to her previous unstable angina because she insists she never had symptoms.  However, she did have chest pain that she does not recall.   I read her this description and it does not seem that she is having any symptoms related to this.     Allergies  Allergen Reactions  . Iohexol      Desc: throat selling resp distress hives.premedicate pt.entered 07/06/04, bsw.     Current Outpatient Prescriptions  Medication Sig Dispense Refill  . aspirin 81 MG tablet Take 81 mg by mouth daily.    . cephALEXin (KEFLEX) 250 MG capsule Take 250 mg by mouth at bedtime.     Marland Kitchen CINNAMON PO Take 1,000 mg by mouth daily.     . Coenzyme Q10 (CO Q-10) 200 MG CAPS Take 1 capsule by mouth daily.      . fish oil-omega-3 fatty acids 1000 MG capsule Take 1.2 g by mouth 2 (two) times daily.    . furosemide (LASIX) 20 MG tablet TAKE 1 TABLET (20 MG TOTAL) BY MOUTH DAILY. 90 tablet 3  . gabapentin (NEURONTIN) 300 MG capsule TAKE ONE CAPSULE BY MOUTH IN THE MORNING TWO IN THE EVENING 270 capsule 0  . Ginkgo Biloba 120 MG CAPS Take 1 capsule by mouth daily.      Marland Kitchen levothyroxine (SYNTHROID, LEVOTHROID) 125 MCG  tablet Take 125 mcg by mouth daily before breakfast.    . Magnesium 250 MG TABS Take 250 mg by mouth daily.    . Multiple Vitamin (MULTIVITAMIN) capsule Take 1 capsule by mouth daily.    . nitroGLYCERIN (NITROSTAT) 0.4 MG SL tablet Place 1 tablet (0.4 mg total) under the tongue every 5 (five) minutes as needed. Pain 25 tablet 3  . pantoprazole (PROTONIX) 40 MG tablet TAKE 1 TABLET (40 MG TOTAL) BY MOUTH 2 (TWO) TIMES DAILY. 180 tablet 3  . PAZEO 0.7 % SOLN Apply 1 drop to eye daily. 1 drop each eye daily.    . potassium chloride SA (KLOR-CON M20) 20 MEQ tablet TAKE TWO TABLETS BY MOUTH TWICE DAILY AND ONE-HALF AT BEDTIME 405 tablet 3  . propranolol ER (INDERAL LA) 60 MG 24 hr capsule TAKE 1 CAPSULE (60 MG TOTAL) BY MOUTH AT BEDTIME. 90 capsule 1  . ranitidine (ZANTAC) 300 MG tablet Take 1 tablet (300 mg total) by mouth 2 (two) times daily. 180 tablet 3  . Riboflavin 400 MG TABS Take 400 mg by mouth daily.    . simvastatin (ZOCOR) 40 MG tablet TAKE ONE TABLET BY MOUTH AT BEDTIME 90 tablet 3  . topiramate (TOPAMAX) 50 MG tablet Take 2 tablets (100 mg total)  by mouth 2 (two) times daily. 120 tablet 11   No current facility-administered medications for this visit.     Past Medical History:  Diagnosis Date  . Anorexia   . Anxiety   . Arrhythmia   . Breast cancer ([place])    ER+/PR+/Her2-  . Concussion 05/26/14  . Depression   . Frequent UTI   . GERD (gastroesophageal reflux disease)   . Hypothyroid   . Microscopic hematuria   . Migraine   . Radiation 03/22/14-04/19/14   Breast Cancer upper-outer  . Radiation 03/2014   right breast  . Restless leg syndrome   . Seasonal allergies   . Stented coronary artery    LAD 95% 2007.  . Vitamin D deficiency   . Wears glasses     Past Surgical History:  Procedure Laterality Date  . ABDOMINAL HYSTERECTOMY    . APPENDECTOMY    . back surgery  1997   herniated disc repair  . CARDIAC CATHETERIZATION  2007   stent  . CHOLECYSTECTOMY OPEN    .  CYSTOSCOPY     neg  . RADIOACTIVE SEED GUIDED MASTECTOMY WITH AXILLARY SENTINEL LYMPH NODE BIOPSY Right 02/14/2014   Procedure: RADIOACTIVE SEED GUIDED PARTIAL MASTECTOMY WITH AXILLARY SENTINEL LYMPH NODE BIOPSY;  Surgeon: Erroll Luna, MD;  Location: [facility];  Service: General;  Laterality: Right;  . SHOULDER SURGERY      ROS:   As stated in the HPI and negative for all other systems.  PHYSICAL EXAM BP 124/72   Ht _0  (1.727 m)   Wt 208 lb 9.6 oz (94.6 kg)   BMI 31.72 kg/m   GENERAL:  Well appearing NECK:  No jugular venous distention, waveform within normal limits, carotid upstroke brisk and symmetric, no bruits, no thyromegaly LUNGS:  Clear to auscultation bilaterally CHEST:  Unremarkable HEART:  PMI not displaced or sustained,S1 and S2 within normal limits, no S3, no S4, no clicks, no rubs, no murmurs ABD:  Flat, positive bowel sounds normal in frequency in pitch, no bruits, no rebound, no guarding, no midline pulsatile mass, no hepatomegaly, no splenomegaly EXT:  2 plus pulses throughout, no edema, no cyanosis no clubbing   EKG:  Sinus rhythm, rate 90, axis within normal limits, intervals within normal limits, poor anterior R wave progression, no acute ST-T wave changes.    Lab Results  Component Value Date   CHOL 143 06/04/2016   TRIG 121.0 06/04/2016   HDL 50.30 06/04/2016   LDLCALC 68 06/04/2016     ASSESSMENT AND PLAN   Palpitations - I will apply a 24 hour Holter because she will not be able to use her Apple watch yet and the symptoms are distressing to her.  Further treatment will be based on these results.    CAD - She has atypical symptoms.   She did not have any high risk findings on her stress test last year. I don't strongly suspect coronary disease at this point. I will follow-up palpitations as above but not planning any ischemia workup.  DYSLIPIDEMIA -  She had an excellent lipid profile as above. No change in therapy.

## 2016-11-20 ENCOUNTER — Encounter: Payer: Self-pay | Admitting: Cardiology

## 2016-11-20 ENCOUNTER — Ambulatory Visit (INDEPENDENT_AMBULATORY_CARE_PROVIDER_SITE_OTHER): Payer: Medicare Other | Admitting: Cardiology

## 2016-11-20 VITALS — BP 124/72 | Ht 68.0 in | Wt 208.6 lb

## 2016-11-20 DIAGNOSIS — R002 Palpitations: Secondary | ICD-10-CM

## 2016-11-20 DIAGNOSIS — I2510 Atherosclerotic heart disease of native coronary artery without angina pectoris: Secondary | ICD-10-CM | POA: Diagnosis not present

## 2016-11-20 NOTE — Patient Instructions (Signed)
Medication Instructions:  Continue current medications  If you need a refill on your cardiac medications before your next appointment, please call your pharmacy.  Labwork: None Ordered  Testing/Procedures: Your physician has recommended that you wear a 24 hour holter monitor. Holter monitors are medical devices that record the heart's electrical activity. Doctors most often use these monitors to diagnose arrhythmias. Arrhythmias are problems with the speed or rhythm of the heartbeat. The monitor is a small, portable device. You can wear one while you do your normal daily activities. This is usually used to diagnose what is causing palpitations/syncope (passing out).   Follow-Up: Your physician wants you to follow-up in: 6 Months. You should receive a reminder letter in the mail two months in advance. If you do not receive a letter, please call our office [phone].    Thank you for choosing [facility] at [facility]!!

## 2016-11-21 DIAGNOSIS — H4423 Degenerative myopia, bilateral: Secondary | ICD-10-CM | POA: Diagnosis not present

## 2016-11-28 ENCOUNTER — Ambulatory Visit (INDEPENDENT_AMBULATORY_CARE_PROVIDER_SITE_OTHER): Payer: Medicare Other | Admitting: Family Medicine

## 2016-11-28 ENCOUNTER — Encounter: Payer: Self-pay | Admitting: Family Medicine

## 2016-11-28 VITALS — BP 124/62 | HR 75 | Temp 98.5°F | Ht 68.0 in | Wt 208.8 lb

## 2016-11-28 DIAGNOSIS — R110 Nausea: Secondary | ICD-10-CM | POA: Diagnosis not present

## 2016-11-28 DIAGNOSIS — E039 Hypothyroidism, unspecified: Secondary | ICD-10-CM

## 2016-11-28 DIAGNOSIS — R509 Fever, unspecified: Secondary | ICD-10-CM | POA: Diagnosis not present

## 2016-11-28 DIAGNOSIS — N390 Urinary tract infection, site not specified: Secondary | ICD-10-CM | POA: Diagnosis not present

## 2016-11-28 DIAGNOSIS — N289 Disorder of kidney and ureter, unspecified: Secondary | ICD-10-CM

## 2016-11-28 DIAGNOSIS — Z87828 Personal history of other (healed) physical injury and trauma: Secondary | ICD-10-CM | POA: Diagnosis not present

## 2016-11-28 DIAGNOSIS — I2510 Atherosclerotic heart disease of native coronary artery without angina pectoris: Secondary | ICD-10-CM | POA: Diagnosis not present

## 2016-11-28 DIAGNOSIS — W57XXXA Bitten or stung by nonvenomous insect and other nonvenomous arthropods, initial encounter: Secondary | ICD-10-CM | POA: Insufficient documentation

## 2016-11-28 LAB — COMPREHENSIVE METABOLIC PANEL
ALK PHOS: 50 U/L (ref 39–117)
ALT: 25 U/L (ref 0–35)
AST: 22 U/L (ref 0–37)
Albumin: 4.3 g/dL (ref 3.5–5.2)
BILIRUBIN TOTAL: 1.3 mg/dL — AB (ref 0.2–1.2)
BUN: 17 mg/dL (ref 6–23)
CALCIUM: 10 mg/dL (ref 8.4–10.5)
CO2: 23 mEq/L (ref 19–32)
CREATININE: 1.26 mg/dL — AB (ref 0.40–1.20)
Chloride: 108 mEq/L (ref 96–112)
GFR: 45.68 mL/min — AB (ref >60.00)
Glucose, Bld: 138 mg/dL — ABNORMAL HIGH (ref 70–99)
Potassium: 3.7 mEq/L (ref 3.5–5.1)
Sodium: 138 mEq/L (ref 135–145)
TOTAL PROTEIN: 7.1 g/dL (ref 6.0–8.3)

## 2016-11-28 LAB — CBC WITH DIFFERENTIAL/PLATELET
BASOS PCT: 0.4 % (ref 0.0–3.0)
Basophils Absolute: 0 10*3/uL (ref 0.0–0.1)
EOS PCT: 1.8 % (ref 0.0–5.0)
Eosinophils Absolute: 0.1 10*3/uL (ref 0.0–0.7)
HEMATOCRIT: 38.5 % (ref 36.0–46.0)
HEMOGLOBIN: 13.1 g/dL (ref 12.0–15.0)
Lymphocytes Relative: 24.8 % (ref 12.0–46.0)
Lymphs Abs: 1.4 10*3/uL (ref 0.7–4.0)
MCHC: 34.1 g/dL (ref 30.0–36.0)
MCV: 91.8 fl (ref 78.0–100.0)
Monocytes Absolute: 0.5 10*3/uL (ref 0.1–1.0)
Monocytes Relative: 9.4 % (ref 3.0–12.0)
Neutro Abs: 3.5 10*3/uL (ref 1.4–7.7)
Neutrophils Relative %: 63.6 % (ref 43.0–77.0)
Platelets: 185 10*3/uL (ref 150.0–400.0)
RBC: 4.19 Mil/uL (ref 3.87–5.11)
RDW: 13.7 % (ref 11.5–15.5)
WBC: 5.6 10*3/uL (ref 4.0–10.5)

## 2016-11-28 LAB — TSH: TSH: 0.57 u[IU]/mL (ref 0.35–4.50)

## 2016-11-28 NOTE — Progress Notes (Signed)
Subjective:    Patient ID: [name] [name], female    DOB: 03/10/1955, 61 y.o.   MRN: [id number]  HPI  Here for concerns of possible lyme disease  Several weeks of strange symptoms Heart palpitations (has seen cardiology) - gets a holter monitor next week  Stomach cramps and some nausea  Muscle cramping - legs and arms and back (different times) Worse migraines - now radiating to L side of her face (makes if feels like face is drooping) Vision is occ blurry-one eye and then the other  Had one really bad uti and treated it - and thought that would take care of things   More forgetful lately -that worries her   She went to see the eye doctor  He saw some sort of abnormality- unsure what (she has f/u soon with him)  She did some reading and some of her symptoms were linked to lyme disease  She gets a lot of tick bites (and has deer in the yard)  Gets bites- with embedded ticks and red marks - no bullseye pattern  Unsure if any rashes  Had a low grade temp   (nothing over 101) - occasionally -not now  Did have a temp one time the day she found a tick bite      Wt Readings from Last 3 Encounters:  11/28/16 208 lb 12 oz (94.7 kg)  11/20/16 208 lb 9.6 oz (94.6 kg)  08/15/16 209 lb 9.6 oz (95.1 kg)   31.74 kg/m   Lab Results  Component Value Date   CREATININE 1.5 (H) 08/15/2016   BUN 15.4 08/15/2016   NA 145 08/15/2016   K 4.8 08/15/2016   CL 106 06/04/2016   CO2 22 08/15/2016   Lab Results  Component Value Date   ALT 27 08/15/2016   AST 28 08/15/2016   ALKPHOS 58 08/15/2016   BILITOT 1.62 (H) 08/15/2016    Lab Results  Component Value Date   WBC 5.7 08/15/2016   HGB 13.9 08/15/2016   HCT 40.0 08/15/2016   MCV 90.9 08/15/2016   PLT 164 08/15/2016   Lab Results  Component Value Date   TSH 0.28 (L) 06/04/2016    Patient Active Problem List   Diagnosis Date Noted  . Tick bite with tick attached for 8 hours or longer 11/28/2016  . Nausea 11/28/2016  .  Intermittent fever 11/28/2016  . Estrogen deficiency 06/24/2016  . Osteopenia 05/18/2016  . Screening for HIV (human immunodeficiency virus) 12/13/2014  . Need for hepatitis C screening test 12/13/2014  . Intractable chronic migraine without aura and without status migrainosus 03/15/2014  . Genetic testing 01/25/2014  . Breast cancer of upper-outer quadrant of right female breast ([place]) 12/30/2013  . Hypothyroid 12/10/2011  . Adverse effects of medication 10/28/2010  . Routine general medical examination at a health care facility 09/30/2010  . PERIODIC LIMB MOVEMENT DISORDER 08/16/2008  . HYPERSOMNIA 06/21/2008  . Somnolence, daytime 06/07/2008  . Vitamin D deficiency 04/07/2008  . Chronic UTI 11/05/2007  . Renal insufficiency 09/16/2007  . Other abnormal glucose 09/16/2007  . HEMATURIA, MICROSCOPIC, HX OF 09/16/2007  . OTHER ABNORMAL BLOOD CHEMISTRY 09/09/2007  . Hyperlipidemia 08/05/2007  . Obesity 08/05/2007  . DEPRESSIVE DISORDER 08/05/2007  . Intractable chronic migraine without aura 08/05/2007  . Coronary atherosclerosis 08/05/2007  . GERD 08/05/2007  . DEGENERATIVE DISC DISEASE, LUMBAR SPINE 08/05/2007  . EDEMA 08/05/2007   Past Medical History:  Diagnosis Date  . Anorexia   . Anxiety   .  Arrhythmia   . Breast cancer ([place])    ER+/PR+/Her2-  . Concussion 05/26/14  . Depression   . Frequent UTI   . GERD (gastroesophageal reflux disease)   . Hypothyroid   . Microscopic hematuria   . Migraine   . Radiation 03/22/14-04/19/14   Breast Cancer upper-outer  . Radiation 03/2014   right breast  . Restless leg syndrome   . Seasonal allergies   . Stented coronary artery    LAD 95% 2007.  . Vitamin D deficiency   . Wears glasses    Past Surgical History:  Procedure Laterality Date  . ABDOMINAL HYSTERECTOMY    . APPENDECTOMY    . back surgery  1997   herniated disc repair  . CARDIAC CATHETERIZATION  2007   stent  . CHOLECYSTECTOMY OPEN    . CYSTOSCOPY     neg  .  SHOULDER SURGERY     Social History   Tobacco Use  . Smoking status: Never Smoker  . Smokeless tobacco: Never Used  Substance Use Topics  . Alcohol use: No    Alcohol/week: 0.0 oz    Comment: socially  . Drug use: No   Family History  Problem Relation Age of Onset  . Coronary artery disease Mother   . Thyroid disease Mother   . Alzheimer's disease Mother   . Diabetes Mother   . Heart disease Mother   . Cancer Brother        melnoma  . Ovarian cancer Paternal Aunt 29  . Bone cancer Paternal Uncle   . Alzheimer's disease Maternal Grandmother   . Heart disease Maternal Grandfather   . Pancreatic cancer Maternal Grandfather 68  . Ovarian cancer Maternal Aunt        dx in 52s  . Throat cancer Maternal Uncle        smoker  . Lung cancer Maternal Uncle        heavy smoker  . Ovarian cancer Paternal Aunt        dx in her 61s  . Cancer Cousin        "female cancer"  . Cancer Father    Allergies  Allergen Reactions  . Iohexol      Desc: throat selling resp distress hives.premedicate pt.entered 07/06/04, bsw.    Current Outpatient Medications on File Prior to Visit  Medication Sig Dispense Refill  . aspirin 81 MG tablet Take 81 mg by mouth daily.    . cephALEXin (KEFLEX) 250 MG capsule Take 250 mg by mouth at bedtime.     Marland Kitchen CINNAMON PO Take 1,000 mg by mouth daily.     . Coenzyme Q10 (CO Q-10) 200 MG CAPS Take 1 capsule by mouth daily.      . fish oil-omega-3 fatty acids 1000 MG capsule Take 1.2 g by mouth 2 (two) times daily.    . furosemide (LASIX) 20 MG tablet TAKE 1 TABLET (20 MG TOTAL) BY MOUTH DAILY. 90 tablet 3  . gabapentin (NEURONTIN) 300 MG capsule TAKE ONE CAPSULE BY MOUTH IN THE MORNING TWO IN THE EVENING 270 capsule 0  . Ginkgo Biloba 120 MG CAPS Take 1 capsule by mouth daily.      Marland Kitchen levothyroxine (SYNTHROID, LEVOTHROID) 125 MCG tablet Take 125 mcg by mouth daily before breakfast.    . Magnesium 250 MG TABS Take 250 mg by mouth daily.    . Multiple Vitamin  (MULTIVITAMIN) capsule Take 1 capsule by mouth daily.    . nitroGLYCERIN (NITROSTAT) 0.4 MG  SL tablet Place 1 tablet (0.4 mg total) under the tongue every 5 (five) minutes as needed. Pain 25 tablet 3  . pantoprazole (PROTONIX) 40 MG tablet TAKE 1 TABLET (40 MG TOTAL) BY MOUTH 2 (TWO) TIMES DAILY. 180 tablet 3  . PAZEO 0.7 % SOLN Apply 1 drop to eye daily. 1 drop each eye daily.    . potassium chloride SA (KLOR-CON M20) 20 MEQ tablet TAKE TWO TABLETS BY MOUTH TWICE DAILY AND ONE-HALF AT BEDTIME 405 tablet 3  . propranolol ER (INDERAL LA) 60 MG 24 hr capsule TAKE 1 CAPSULE (60 MG TOTAL) BY MOUTH AT BEDTIME. 90 capsule 1  . ranitidine (ZANTAC) 300 MG tablet Take 1 tablet (300 mg total) by mouth 2 (two) times daily. 180 tablet 3  . Riboflavin 400 MG TABS Take 400 mg by mouth daily.    . simvastatin (ZOCOR) 40 MG tablet TAKE ONE TABLET BY MOUTH AT BEDTIME 90 tablet 3  . topiramate (TOPAMAX) 50 MG tablet Take 2 tablets (100 mg total) by mouth 2 (two) times daily. 120 tablet 11   No current facility-administered medications on file prior to visit.      Review of Systems  Constitutional: Positive for appetite change, fatigue and fever. Negative for activity change and unexpected weight change.  HENT: Negative for congestion, ear pain, rhinorrhea, sinus pressure and sore throat.   Eyes: Positive for visual disturbance. Negative for pain and redness.  Respiratory: Negative for cough, shortness of breath and wheezing.   Cardiovascular: Negative for chest pain, palpitations and leg swelling.  Gastrointestinal: Positive for nausea. Negative for abdominal pain, blood in stool, constipation, diarrhea and vomiting.  Endocrine: Negative for polydipsia and polyuria.  Genitourinary: Positive for frequency. Negative for decreased urine volume, dysuria, hematuria, pelvic pain and urgency.  Musculoskeletal: Positive for arthralgias and myalgias. Negative for back pain.       Pos for muscle cramps  Skin: Negative  for pallor and rash.  Allergic/Immunologic: Negative for environmental allergies.  Neurological: Positive for headaches. Negative for dizziness and syncope.  Hematological: Negative for adenopathy. Does not bruise/bleed easily.  Psychiatric/Behavioral: Positive for decreased concentration. Negative for dysphoric mood. The patient is not nervous/anxious.        Objective:   Physical Exam  Constitutional: She appears well-developed and well-nourished. No distress.  obese and well appearing   HENT:  Head: Normocephalic and atraumatic.  Mouth/Throat: Oropharynx is clear and moist.  Eyes: Conjunctivae and EOM are normal. Pupils are equal, round, and reactive to light.  Neck: Normal range of motion. Neck supple. No JVD present. Carotid bruit is not present. No thyromegaly present.  Cardiovascular: Normal rate, regular rhythm, normal heart sounds and intact distal pulses. Exam reveals no gallop.  Pulmonary/Chest: Effort normal and breath sounds normal. No respiratory distress. She has no wheezes. She has no rales.  No crackles  Abdominal: Soft. Bowel sounds are normal. She exhibits no distension, no abdominal bruit and no mass. There is no tenderness. There is no rebound and no guarding.  No suprapubic tenderness or fullness   No cva tenderness   Musculoskeletal: She exhibits tenderness. She exhibits no edema.  Some myofascial/muscle tenderness in shoulders  Lymphadenopathy:    She has no cervical adenopathy.  Neurological: She is alert. She has normal reflexes. No cranial nerve deficit. She exhibits normal muscle tone. Coordination normal.  Skin: Skin is warm and dry. No rash noted. No pallor.  No rash or active tick bites   Some cat scratches/healing  Psychiatric:  She has a normal mood and affect.  Pleasant and talkative          Assessment & Plan:   Problem List Items Addressed This Visit      Endocrine   Hypothyroid    More fatigue and muscle cramps lately Check TSH        Relevant Orders   TSH (Completed)     Musculoskeletal and Integument   Tick bite with tick attached for 8 hours or longer    Multiple tick bites  (all deer ticks) In light of many ? Related symptoms desires lyme testing -will do that today No rash or bites today      Relevant Orders   B. burgdorfi antibodies by WB     Genitourinary   Chronic UTI    UA today - does not currently feel like she has uti  She treats with keflex when needed  Has renal insuff       Renal insufficiency - Primary    Disc imp of water intake and avoidance of nephrotoxic medicines  Also prompt tx of uti (last tx with keflex)  UA and renal panel today      Relevant Orders   Comprehensive metabolic panel (Completed)     Other   Intermittent fever    Usually low grade temp /once was at 101  Hx of tick bites this summer Hx of uti   Lab and ua today incl lyme testing       Relevant Orders   CBC with Differential/Platelet (Completed)   B. burgdorfi antibodies by WB   Nausea    Unsure of cause  Disc diet and meds Has hx of renal insuff  ua and lab today      Relevant Orders   CBC with Differential/Platelet (Completed)   B. burgdorfi antibodies by WB

## 2016-11-28 NOTE — Patient Instructions (Signed)
Labs today (including lyme disease labs) Also we will check a urinalysis  Take care of yourself and get lots of fluid (try to get 64 oz per day of water mostly)  Acetaminophen is ok for fever if you need it   We will make a plan when we get results

## 2016-11-30 ENCOUNTER — Telehealth: Payer: Self-pay | Admitting: Family Medicine

## 2016-11-30 NOTE — Assessment & Plan Note (Signed)
Disc imp of water intake and avoidance of nephrotoxic medicines  Also prompt tx of uti (last tx with keflex)  UA and renal panel today

## 2016-11-30 NOTE — Assessment & Plan Note (Signed)
Usually low grade temp /once was at 101  Hx of tick bites this summer Hx of uti   Lab and ua today incl lyme testing

## 2016-11-30 NOTE — Assessment & Plan Note (Signed)
More fatigue and muscle cramps lately Check TSH

## 2016-11-30 NOTE — Assessment & Plan Note (Signed)
Multiple tick bites  (all deer ticks) In light of many ? Related symptoms desires lyme testing -will do that today No rash or bites today

## 2016-11-30 NOTE — Telephone Encounter (Signed)
Please let her know labs so far look ok  Renal function is a little improved Did she end up giving a UA at the visit?   Still waiting on tick labs

## 2016-11-30 NOTE — Assessment & Plan Note (Signed)
Unsure of cause  Disc diet and meds Has hx of renal insuff  ua and lab today

## 2016-11-30 NOTE — Assessment & Plan Note (Signed)
UA today - does not currently feel like she has uti  She treats with keflex when needed  Has renal insuff

## 2016-12-01 LAB — POC URINALSYSI DIPSTICK (AUTOMATED)
Bilirubin, UA: NEGATIVE
Glucose, UA: NEGATIVE
Ketones, UA: NEGATIVE
Leukocytes, UA: NEGATIVE
NITRITE UA: NEGATIVE
PH UA: 6 (ref 5.0–8.0)
Protein, UA: NEGATIVE
RBC UA: NEGATIVE
Spec Grav, UA: 1.01 (ref 1.010–1.025)
UROBILINOGEN UA: 0.2 U/dL

## 2016-12-01 NOTE — Telephone Encounter (Signed)
She did leave a UA and it was my error that I didn't enter UA results in Friday, I just put them in Epic, UA was clear

## 2016-12-01 NOTE — Telephone Encounter (Signed)
Aware-created result note

## 2016-12-01 NOTE — Telephone Encounter (Signed)
Pt notified of lab results and Dr. Tower's comments 

## 2016-12-01 NOTE — Addendum Note (Signed)
Addended by: Tammi Sou on: 12/01/2016 08:19 AM   Modules accepted: Orders

## 2016-12-03 LAB — B. BURGDORFI ANTIBODIES BY WB
B BURGDORFERI IGG ABS (IB): NEGATIVE
B burgdorferi IgM Abs (IB): NEGATIVE
LYME DISEASE 18 KD IGG: NONREACTIVE
LYME DISEASE 23 KD IGG: NONREACTIVE
LYME DISEASE 23 KD IGM: NONREACTIVE
LYME DISEASE 39 KD IGM: NONREACTIVE
Lyme Disease 28 kD IgG: NONREACTIVE
Lyme Disease 30 kD IgG: NONREACTIVE
Lyme Disease 39 kD IgG: REACTIVE — AB
Lyme Disease 41 kD IgG: REACTIVE — AB
Lyme Disease 41 kD IgM: NONREACTIVE
Lyme Disease 45 kD IgG: NONREACTIVE
Lyme Disease 58 kD IgG: NONREACTIVE
Lyme Disease 66 kD IgG: NONREACTIVE
Lyme Disease 93 kD IgG: NONREACTIVE

## 2016-12-04 ENCOUNTER — Ambulatory Visit (INDEPENDENT_AMBULATORY_CARE_PROVIDER_SITE_OTHER): Payer: Medicare Other

## 2016-12-04 DIAGNOSIS — R002 Palpitations: Secondary | ICD-10-CM | POA: Diagnosis not present

## 2016-12-08 DIAGNOSIS — H16143 Punctate keratitis, bilateral: Secondary | ICD-10-CM | POA: Diagnosis not present

## 2016-12-09 ENCOUNTER — Encounter: Payer: Self-pay | Admitting: Family Medicine

## 2016-12-11 ENCOUNTER — Other Ambulatory Visit (INDEPENDENT_AMBULATORY_CARE_PROVIDER_SITE_OTHER): Payer: Medicare Other

## 2016-12-11 ENCOUNTER — Telehealth: Payer: Self-pay | Admitting: Family Medicine

## 2016-12-11 DIAGNOSIS — R5382 Chronic fatigue, unspecified: Secondary | ICD-10-CM

## 2016-12-11 DIAGNOSIS — M2550 Pain in unspecified joint: Secondary | ICD-10-CM

## 2016-12-11 DIAGNOSIS — M069 Rheumatoid arthritis, unspecified: Secondary | ICD-10-CM | POA: Diagnosis not present

## 2016-12-11 DIAGNOSIS — R252 Cramp and spasm: Secondary | ICD-10-CM | POA: Insufficient documentation

## 2016-12-11 DIAGNOSIS — R002 Palpitations: Secondary | ICD-10-CM | POA: Diagnosis not present

## 2016-12-11 DIAGNOSIS — N289 Disorder of kidney and ureter, unspecified: Secondary | ICD-10-CM

## 2016-12-11 DIAGNOSIS — Z87828 Personal history of other (healed) physical injury and trauma: Secondary | ICD-10-CM

## 2016-12-11 DIAGNOSIS — R509 Fever, unspecified: Secondary | ICD-10-CM

## 2016-12-11 DIAGNOSIS — R5383 Other fatigue: Secondary | ICD-10-CM | POA: Insufficient documentation

## 2016-12-11 LAB — VITAMIN B12: VITAMIN B 12: 329 pg/mL (ref 211–911)

## 2016-12-11 LAB — MAGNESIUM: MAGNESIUM: 2.2 mg/dL (ref 1.5–2.5)

## 2016-12-11 LAB — SEDIMENTATION RATE: SED RATE: 7 mm/h (ref 0–30)

## 2016-12-11 NOTE — Telephone Encounter (Signed)
My interp of the lyme disease test is that more parts have to be positive to be a positive result  If she is still having fevers-perhaps we could refer her to an infectious disease specialist (does she want to do this?) I am not aware of other conditions that could link her symptoms but we could also do some rheumatoid labs (? The last time she had them) for ANA and rheumatoid factor and sed rate (could add these to her planned lab work)   Tell her I am so sorry I am out of the office -but she is also free to make an appt with any of the other providers as well

## 2016-12-11 NOTE — Telephone Encounter (Signed)
Pt is coming in now for labs she would like the rheumatoid labs you advise please put orders in ASAP, also pt does was a referral to ID doc pt said if possible please put referral in Urgently because she feels so bad, I advise pt our [facility] will call to schedule appt

## 2016-12-11 NOTE — Telephone Encounter (Signed)
In reference to pt's continued symptoms - I think we will have to work them up individually   For the palpitations- cardiology f/u and holter as planned   For muscle cramps- let's check a magnesium level on her please (non fasting lab) as well as a B12 level  For vision change- f/u with opthy as planned  For increased headache and concentration problems- follow up with neuro (also have her mention the muscle cramps to her neurologist)  For nausea- re affirm she is taking her zantac  Any worse or better?  Does she think she may need to return to protonix or other drug of that class?   Thanks - let her know I am out for another few weeks

## 2016-12-11 NOTE — Telephone Encounter (Signed)
Spoke with patient- patient feels that all of her symptoms are related because they occurred together at the same time.  Patient has just finished the halter test and she is waiting for test results.  Patient has seen the ophthalmologist- she is on antibiotic eye drops Shirley Friar- she has follow up in December  Patient is still having muscle cramps in her legs/arms- patient still has questions about lyme disease test- the reactive parts she is concerned that the results are indeed positive.  Patient has appointment coming up with neurologist- and will share test results with as well.  Patient is still nauseous - reports she is actually worse. She is taking Zantac/Protonix.   Patient is tearful and frustrated- she wants to feel better. She is in so much pain. Offered an appointment- but patient is reluctant to come in. She did consent to lab appointment.

## 2016-12-11 NOTE — Telephone Encounter (Signed)
Left voicemail requesting pt to call the office back (CRM created)

## 2016-12-11 NOTE — Telephone Encounter (Signed)
Looks like she already had the labs  I put the ref in and will route to [facility]

## 2016-12-12 ENCOUNTER — Telehealth: Payer: Self-pay | Admitting: Cardiology

## 2016-12-12 DIAGNOSIS — F331 Major depressive disorder, recurrent, moderate: Secondary | ICD-10-CM | POA: Diagnosis not present

## 2016-12-12 LAB — ANTI-NUCLEAR AB-TITER (ANA TITER)

## 2016-12-12 LAB — RHEUMATOID FACTOR: Rhuematoid fact SerPl-aCnc: <14 IU/mL (ref <14)

## 2016-12-12 LAB — ANA: ANA: POSITIVE — AB

## 2016-12-12 NOTE — Telephone Encounter (Signed)
New message ° ° °Patient calling for monitor results. Please call °

## 2016-12-12 NOTE — Telephone Encounter (Signed)
Called patient and LMOM to return my call regarding Referral.

## 2016-12-12 NOTE — Telephone Encounter (Signed)
Lm2cb 

## 2016-12-15 ENCOUNTER — Telehealth: Payer: Self-pay | Admitting: Family Medicine

## 2016-12-15 ENCOUNTER — Ambulatory Visit
Admission: RE | Admit: 2016-12-15 | Discharge: 2016-12-15 | Disposition: A | Discharge status: Home / Self Care | Payer: Medicare Other | Source: Ambulatory Visit | Attending: Hematology | Admitting: Hematology

## 2016-12-15 DIAGNOSIS — M2550 Pain in unspecified joint: Secondary | ICD-10-CM

## 2016-12-15 DIAGNOSIS — C50411 Malignant neoplasm of upper-outer quadrant of right female breast: Secondary | ICD-10-CM

## 2016-12-15 DIAGNOSIS — R928 Other abnormal and inconclusive findings on diagnostic imaging of breast: Secondary | ICD-10-CM | POA: Diagnosis not present

## 2016-12-15 DIAGNOSIS — Z170 Estrogen receptor positive status [ER+]: Principal | ICD-10-CM

## 2016-12-15 DIAGNOSIS — R768 Other specified abnormal immunological findings in serum: Secondary | ICD-10-CM | POA: Insufficient documentation

## 2016-12-15 DIAGNOSIS — R509 Fever, unspecified: Secondary | ICD-10-CM

## 2016-12-15 DIAGNOSIS — R252 Cramp and spasm: Secondary | ICD-10-CM

## 2016-12-15 HISTORY — DX: Personal history of irradiation: Z92.3

## 2016-12-15 NOTE — Telephone Encounter (Signed)
Returned the phone call to the patient to inform her of the results. She stated that she will start taking the propranolol in the morning but would also like to have the Metoprolol to have as needed.  Will route to the provider for clairification on the dosage of Metoprolol Tartrate.  Notes recorded by Minus Breeding, MD on 12/12/2016 at 2:56 PM EST Mild ectopy on her monitor. She could try to take additional beta blocker. (We could call in metoprolol tartrate 10 mg PRN which she could take as needed and continue the propranolol.) Or she could try the taking the propranolol that she is on in the morning. Call Ms. Blasdell with the results and send results to Tower, Wynelle Fanny, MD

## 2016-12-15 NOTE — Telephone Encounter (Signed)
Returning Michelle's call from 12-11-16.

## 2016-12-15 NOTE — Telephone Encounter (Signed)
Sorry metoprolol tartrate 25 mg.  Take 1/2 q4 hours prn palpitations.  Disp number 30 with 11 refills.

## 2016-12-15 NOTE — Telephone Encounter (Signed)
-----   Message from Tammi Sou, [place] sent at 12/15/2016  9:04 AM EST ----- Pt was notified of lab results and Dr. Marliss Coots comments. Pt agrees with referral, ? If ID referral is still needed, and ? If you can place urgent due to sxs pt is still having

## 2016-12-15 NOTE — Telephone Encounter (Signed)
I will to ahead with the referral  Of note- when I did the ID referral and then spoke with Rosaria Ferries on Friday she stated she could NOT reach the patient at all regarding that referral   Thanks

## 2016-12-16 MED ORDER — METOPROLOL TARTRATE 25 MG PO TABS: 12.5000 mg | ORAL_TABLET | ORAL | 5 refills | Status: DC | PRN

## 2016-12-16 NOTE — Telephone Encounter (Signed)
Returned the call to the patient to verify the metoprolol order. She verbalized her understanding and the medication will be called in for her.

## 2016-12-16 NOTE — Telephone Encounter (Signed)
Returning your call from this morning. °

## 2016-12-22 DIAGNOSIS — M791 Myalgia, unspecified site: Secondary | ICD-10-CM | POA: Diagnosis not present

## 2016-12-22 DIAGNOSIS — F419 Anxiety disorder, unspecified: Secondary | ICD-10-CM | POA: Diagnosis not present

## 2016-12-22 DIAGNOSIS — W57XXXA Bitten or stung by nonvenomous insect and other nonvenomous arthropods, initial encounter: Secondary | ICD-10-CM | POA: Diagnosis not present

## 2016-12-22 DIAGNOSIS — R768 Other specified abnormal immunological findings in serum: Secondary | ICD-10-CM | POA: Diagnosis not present

## 2016-12-22 DIAGNOSIS — M62838 Other muscle spasm: Secondary | ICD-10-CM | POA: Diagnosis not present

## 2016-12-29 DIAGNOSIS — R300 Dysuria: Secondary | ICD-10-CM | POA: Diagnosis not present

## 2016-12-29 DIAGNOSIS — R3915 Urgency of urination: Secondary | ICD-10-CM | POA: Diagnosis not present

## 2016-12-31 ENCOUNTER — Encounter: Payer: Self-pay | Admitting: Adult Health

## 2016-12-31 ENCOUNTER — Ambulatory Visit (INDEPENDENT_AMBULATORY_CARE_PROVIDER_SITE_OTHER): Payer: Medicare Other | Admitting: Adult Health

## 2016-12-31 VITALS — BP 122/64 | HR 61 | Ht 68.0 in | Wt 209.0 lb

## 2016-12-31 DIAGNOSIS — I2510 Atherosclerotic heart disease of native coronary artery without angina pectoris: Secondary | ICD-10-CM | POA: Diagnosis not present

## 2016-12-31 DIAGNOSIS — R519 Headache, unspecified: Secondary | ICD-10-CM

## 2016-12-31 DIAGNOSIS — R51 Headache: Secondary | ICD-10-CM

## 2016-12-31 NOTE — Progress Notes (Signed)
PATIENT: [name] [name] DOB: 1955/09/13  REASON FOR VISIT: follow up HISTORY FROM: patient  HISTORY OF PRESENT ILLNESS: Today 12/31/16 [name] [name] is a 61 year old female with a history of headaches.  She returns today for follow-up.  She reports that she had to take herself off of gabapentin as it was causing drowsiness and making her feel lethargic.  She states that she is currently taking Topamax 100 mg twice a day.  She reports that it was doing well for her headaches however recently she is beginning to have new symptoms supposedly after a tick bite.  She reports that she was having heart palpitations, muscle cramps, worsening headaches, blurry vision.  She did see her primary care who did blood work.  Patient had an abnormal ANA and several bands of her Lyme's titer was positive.  The patient was referred to rheumatology however she reports that she had one visit and they suggested an extensive workup and she refused.  She states that she is now being referred to infectious disease to rule out Lyme's disease.  The patient states that since she developed these other symptoms her headaches have gotten worse.  She reports that they are daily, typically can last a couple of hours.  She said it can range from mild headaches to severe headaches.  She reports photophobia as well as nausea but no vomiting.  She returns today for an evaluation.  HISTORY [name] [name] is a 61 year old female with a history of headaches. She returns today for follow-up. She is currently taking gabapentin 300 mg in the morning and 600 mg in the evening as well as Topamax 50 mg in the morning and 100 mg in evening. She reports that she has daily headaches. Her headache occur anytime throughout the day. It typically occurs on the left side. Occasionally she will have photophobia and light nausea. She reports that these headaches, lasting anywhere from 2-4 hours. Typically she does take Aleve. Reports that she tries not to take  it daily. She states that she can't remember the last time she had a true migraine. She states that sometimes she has the sensation like she may be developing migraine but it never comes. She did have a sleep study through another office and reports that it did not show apnea. I have not seen the results of this sleep study. She states that she feels gabapentin has caused some weight gain. She states currently she is trying to keep her thyroid levels in normal range. Reports that her endocrinologist feels that her weight gain may be causing her thyroid levels to fluctuate. She returns today for an evaluation.    REVIEW OF SYSTEMS: Out of a complete 14 system review of symptoms, the patient complains only of the following symptoms, and all other reviewed systems are negative.  See HPI   ALLERGIES: Allergies  Allergen Reactions  . Iohexol      Desc: throat selling resp distress hives.premedicate pt.entered 07/06/04, bsw.     HOME MEDICATIONS: Outpatient Medications Prior to Visit  Medication Sig Dispense Refill  . aspirin 81 MG tablet Take 81 mg by mouth daily.    . cephALEXin (KEFLEX) 250 MG capsule Take 250 mg by mouth at bedtime.     Marland Kitchen CINNAMON PO Take 1,000 mg by mouth daily.     . Coenzyme Q10 (CO Q-10) 200 MG CAPS Take 1 capsule by mouth daily.      . cycloSPORINE (RESTASIS) 0.05 % ophthalmic emulsion Place 1 drop  into both eyes 2 (two) times daily.    . fish oil-omega-3 fatty acids 1000 MG capsule Take 1.2 g by mouth 2 (two) times daily.    . furosemide (LASIX) 20 MG tablet TAKE 1 TABLET (20 MG TOTAL) BY MOUTH DAILY. 90 tablet 3  . Ginkgo Biloba 120 MG CAPS Take 1 capsule by mouth daily.      Marland Kitchen levothyroxine (SYNTHROID, LEVOTHROID) 125 MCG tablet Take 125 mcg by mouth daily before breakfast.    . Magnesium 250 MG TABS Take 250 mg by mouth daily.    . metoprolol tartrate (LOPRESSOR) 25 MG tablet Take 0.5 tablets (12.5 mg total) by mouth every 4 (four) hours as needed (As needed for  palpitations). 30 tablet 5  . Multiple Vitamin (MULTIVITAMIN) capsule Take 1 capsule by mouth daily.    . nitroGLYCERIN (NITROSTAT) 0.4 MG SL tablet Place 1 tablet (0.4 mg total) under the tongue every 5 (five) minutes as needed. Pain 25 tablet 3  . pantoprazole (PROTONIX) 40 MG tablet TAKE 1 TABLET (40 MG TOTAL) BY MOUTH 2 (TWO) TIMES DAILY. 180 tablet 3  . PAZEO 0.7 % SOLN Apply 1 drop to eye daily. 1 drop each eye daily.    . potassium chloride SA (KLOR-CON M20) 20 MEQ tablet TAKE TWO TABLETS BY MOUTH TWICE DAILY AND ONE-HALF AT BEDTIME 405 tablet 3  . propranolol ER (INDERAL LA) 60 MG 24 hr capsule TAKE 1 CAPSULE (60 MG TOTAL) BY MOUTH AT BEDTIME. 90 capsule 1  . ranitidine (ZANTAC) 300 MG tablet Take 1 tablet (300 mg total) by mouth 2 (two) times daily. 180 tablet 3  . Riboflavin 400 MG TABS Take 400 mg by mouth daily.    . simvastatin (ZOCOR) 40 MG tablet TAKE ONE TABLET BY MOUTH AT BEDTIME 90 tablet 3  . topiramate (TOPAMAX) 50 MG tablet Take 2 tablets (100 mg total) by mouth 2 (two) times daily. 120 tablet 11  . gabapentin (NEURONTIN) 300 MG capsule TAKE ONE CAPSULE BY MOUTH IN THE MORNING TWO IN THE EVENING 270 capsule 0   No facility-administered medications prior to visit.     PAST MEDICAL HISTORY: Past Medical History:  Diagnosis Date  . Anorexia   . Anxiety   . Arrhythmia   . Breast cancer ([place])    ER+/PR+/Her2-  . Concussion 05/26/14  . Depression   . Frequent UTI   . GERD (gastroesophageal reflux disease)   . Hypothyroid   . Microscopic hematuria   . Migraine   . Personal history of radiation therapy   . Radiation 03/22/14-04/19/14   Breast Cancer upper-outer  . Radiation 03/2014   right breast  . Restless leg syndrome   . Seasonal allergies   . Stented coronary artery    LAD 95% 2007.  . Vitamin D deficiency   . Wears glasses     PAST SURGICAL HISTORY: Past Surgical History:  Procedure Laterality Date  . ABDOMINAL HYSTERECTOMY    . APPENDECTOMY    . back  surgery  1997   herniated disc repair  . BREAST LUMPECTOMY Right    2016  . CARDIAC CATHETERIZATION  2007   stent  . CHOLECYSTECTOMY OPEN    . CYSTOSCOPY     neg  . RADIOACTIVE SEED GUIDED PARTIAL MASTECTOMY WITH AXILLARY SENTINEL LYMPH NODE BIOPSY Right 02/14/2014   Procedure: RADIOACTIVE SEED GUIDED PARTIAL MASTECTOMY WITH AXILLARY SENTINEL LYMPH NODE BIOPSY;  Surgeon: Erroll Luna, MD;  Location: [facility];  Service: General;  Laterality:  Right;  Marland Kitchen SHOULDER SURGERY      FAMILY HISTORY: Family History  Problem Relation Age of Onset  . Coronary artery disease Mother   . Thyroid disease Mother   . Alzheimer's disease Mother   . Diabetes Mother   . Heart disease Mother   . Cancer Brother        melnoma  . Ovarian cancer Paternal Aunt 8  . Bone cancer Paternal Uncle   . Alzheimer's disease Maternal Grandmother   . Heart disease Maternal Grandfather   . Pancreatic cancer Maternal Grandfather 70  . Ovarian cancer Maternal Aunt        dx in 82s  . Throat cancer Maternal Uncle        smoker  . Lung cancer Maternal Uncle        heavy smoker  . Ovarian cancer Paternal Aunt        dx in her 43s  . Cancer Cousin        "female cancer"  . Cancer Father     SOCIAL HISTORY: Social History   Socioeconomic History  . Marital status: Widowed    Spouse name: Not on file  . Number of children: 1  . Years of education: some coll.  . Highest education level: Not on file  Social Needs  . Financial resource strain: Not on file  . Food insecurity - worry: Not on file  . Food insecurity - inability: Not on file  . Transportation needs - medical: Not on file  . Transportation needs - non-medical: Not on file  Occupational History  . Occupation: not working  Tobacco Use  . Smoking status: Never Smoker  . Smokeless tobacco: Never Used  Substance and Sexual Activity  . Alcohol use: No    Alcohol/week: 0.0 oz    Comment: socially  . Drug use: No  . Sexual  activity: Not Currently  Other Topics Concern  . Not on file  Social History Narrative   Does not get any regular exercise      Doctors   -uro-[facility]   -cardio--hochrein   -Neurology--Dr. Jannifer Franklin   -allergies--Dr. Carmelina Peal   Gyn past--Dr. Warnell Forester   Couselor--Dr. Mitchum   Endo-- Dr. Chalmers Cater      Lives with son who is 62 years old   Patient is right handed.   Patient drinks 1-2 cups of caffeine daily.      PHYSICAL EXAM  Vitals:   12/31/16 1333  BP: 122/64  Pulse: 61  Weight: 209 lb (94.8 kg)  Height: 5' 8" (1.727 m)   Body mass index is 31.78 kg/m.  Generalized: Well developed, in no acute distress   Neurological examination  Mentation: Alert oriented to time, place, history taking. Follows all commands speech and language fluent Cranial nerve II-XII: Pupils were equal round reactive to light. Extraocular movements were full, visual field were full on confrontational test. Facial sensation and strength were normal. Uvula tongue midline. Head turning and shoulder shrug  were normal and symmetric. Motor: The motor testing reveals 5 over 5 strength of all 4 extremities. Good symmetric motor tone is noted throughout.  Sensory: Sensory testing is intact to soft touch on all 4 extremities. No evidence of extinction is noted.  Coordination: Cerebellar testing reveals good finger-nose-finger and heel-to-shin bilaterally.  Gait and station: Gait is normal.   Reflexes: Deep tendon reflexes are symmetric and normal bilaterally.   DIAGNOSTIC DATA (LABS, IMAGING, TESTING) - I reviewed patient records, labs, notes, testing and imaging myself  where available.  Lab Results  Component Value Date   WBC 5.6 11/28/2016   HGB 13.1 11/28/2016   HCT 38.5 11/28/2016   MCV 91.8 11/28/2016   PLT 185.0 11/28/2016      Component Value Date/Time   NA 138 11/28/2016 1509   NA 145 08/15/2016 1326   K 3.7 11/28/2016 1509   K 4.8 08/15/2016 1326   CL 108 11/28/2016 1509   CO2 23  11/28/2016 1509   CO2 22 08/15/2016 1326   GLUCOSE 138 (H) 11/28/2016 1509   GLUCOSE 129 08/15/2016 1326   BUN 17 11/28/2016 1509   BUN 15.4 08/15/2016 1326   CREATININE 1.26 (H) 11/28/2016 1509   CREATININE 1.5 (H) 08/15/2016 1326   CALCIUM 10.0 11/28/2016 1509   CALCIUM 10.4 08/15/2016 1326   PROT 7.1 11/28/2016 1509   PROT 7.5 08/15/2016 1326   ALBUMIN 4.3 11/28/2016 1509   ALBUMIN 4.2 08/15/2016 1326   AST 22 11/28/2016 1509   AST 28 08/15/2016 1326   ALT 25 11/28/2016 1509   ALT 27 08/15/2016 1326   ALKPHOS 50 11/28/2016 1509   ALKPHOS 58 08/15/2016 1326   BILITOT 1.3 (H) 11/28/2016 1509   BILITOT 1.62 (H) 08/15/2016 1326   GFRNONAA 47 (L) 02/13/2014 1100   GFRAA 54 (L) 02/13/2014 1100   Lab Results  Component Value Date   CHOL 143 06/04/2016   HDL 50.30 06/04/2016   LDLCALC 68 06/04/2016   TRIG 121.0 06/04/2016   CHOLHDL 3 06/04/2016   Lab Results  Component Value Date   HGBA1C 5.8 (H) 03/28/2016   Lab Results  Component Value Date   VITAMINB12 329 12/11/2016   Lab Results  Component Value Date   TSH 0.57 11/28/2016      ASSESSMENT AND PLAN 61 y.o. year old female  has a past medical history of Anorexia, Anxiety, Arrhythmia, Breast cancer ([place]), Concussion (05/26/14), Depression, Frequent UTI, GERD (gastroesophageal reflux disease), Hypothyroid, Microscopic hematuria, Migraine, Personal history of radiation therapy, Radiation (03/22/14-04/19/14), Radiation (03/2014), Restless leg syndrome, Seasonal allergies, Stented coronary artery, Vitamin D deficiency, and Wears glasses. here with:  1.  Migraine headaches  The patient spent most of her visit discussing lab work that was completed at her primary care provider.  I advised that we could potentially add on another medication for her migraines or she can wait for further workup by infectious disease. Despite her new symptoms her headache frequency is the same as she reported at the last visit Patient states that  she does not want to add any new medication at this time. She would like to see if she officially has Lyme's disease.  I am amenable to this plan.  She is advised that if her headaches worsen or she develops new symptoms she should let us know.  She will follow-up in 6 months or sooner if needed.  I spent 25 minutes with the patient. 50% of this time was spent discussing the patient's concerns about her lab work.    Ward Givens, MSN, NP-C 12/31/2016, 2:03 PM [facility] [address], [place], [place] [address] [phone]

## 2016-12-31 NOTE — Progress Notes (Signed)
I have read the note, and I agree with the clinical assessment and plan.  [name] [name]   

## 2016-12-31 NOTE — Patient Instructions (Addendum)
Your Plan:  Continue Topamax Follow-up with Dr. Alba Cory and infectious disease If your symptoms worsen or you develop new symptoms please let us know.   Thank you for coming to see [place] at [facility]. I hope we have been able to provide you high quality care today.  You may receive a patient satisfaction survey over the next few weeks. We would appreciate your feedback and comments so that we may continue to improve ourselves and the health of our patients.

## 2017-01-21 DIAGNOSIS — H25043 Posterior subcapsular polar age-related cataract, bilateral: Secondary | ICD-10-CM | POA: Diagnosis not present

## 2017-02-11 ENCOUNTER — Encounter: Payer: Self-pay | Admitting: Infectious Diseases

## 2017-02-11 ENCOUNTER — Ambulatory Visit (INDEPENDENT_AMBULATORY_CARE_PROVIDER_SITE_OTHER): Payer: Medicare Other | Admitting: Infectious Diseases

## 2017-02-11 VITALS — BP 112/74 | HR 60 | Temp 98.6°F | Wt 208.0 lb

## 2017-02-11 DIAGNOSIS — H539 Unspecified visual disturbance: Secondary | ICD-10-CM | POA: Diagnosis not present

## 2017-02-11 DIAGNOSIS — E039 Hypothyroidism, unspecified: Secondary | ICD-10-CM

## 2017-02-11 DIAGNOSIS — R768 Other specified abnormal immunological findings in serum: Secondary | ICD-10-CM | POA: Diagnosis not present

## 2017-02-11 DIAGNOSIS — R509 Fever, unspecified: Secondary | ICD-10-CM

## 2017-02-11 NOTE — Progress Notes (Signed)
[name] [name] 1955/10/26 [id number] PCP: Abner Greenspan, MD   Reason for Visit: lyme disease test results, cardiac complaints, vision changes   HPI:  [name] [name] is a 62 y.o. female here today for  Chief Complaint  Patient presents with  . Lyme Disease Labs   [name] tells me that she was in her normal state of health until early to mid October 2018 when she found herself to feel very very ill. She describes an illness that was characterized by a high fever, intermittent racing heart sensations/palpitations, extreme neck pain/soreness and headaches with scalp tenderness. Throughout late September and October months she has found 3 ticks on her at different points. The first 2 did not bother her at all and she pulled them off without issue or concern. However the 3rd tick she found after she started experiencing fevers neck pain/headaches and described it to be embedded behind her right knee. She described that once it was removed she noticed a large red well circumscribed area and it itched intensely. She is uncertain if this was the 'classic bullseye rash' seen with lyme disease as it was in a difficult place to fully assess behind her knee. She had no other rashes evolve after this time in other areas. Since that time her neck/head pain have resolved and her temperatures are only noted to be about 100 deg once maybe twice a week. The most concerning thing for her has been the ongoing and worsening sensations of her heart racing. She has been to see her cardiologist and 24h holder monitor showed some occasional sinus tachycardia for which she is taking metoprolol for. She was seen by her ophthalmologist who actually urged her to continue to pursue evaluation for cause of changes to her eyes (although she is not quite sure what these changes are) and prescribed her drops to which have helped.   She has since had good days and bad days with intermittent generalized muscle pains and  intermittent nausea/dizziness. She has had no progression of her vision changes recently and is stable on her drops. She has no joint pain and describes intermittent fatigue that is not debilitating but generally she feels unwell and that something is wrong. She is very concerned that her symptoms are related to previous exposure to tick and worries about long-term damage. She sought care with her PCP in early November and had western blot lyme testing done which was (+) for two IgG bands [39 and 41]. She never received any prophylactic doxycyline after her exposure.   Review of Systems  Constitutional: Positive for fever and malaise/fatigue. Negative for weight loss.  HENT: Negative for hearing loss, sore throat and tinnitus.   Eyes: Positive for blurred vision. Negative for photophobia and pain.  Respiratory: Negative for cough and shortness of breath.   Cardiovascular: Positive for chest pain and palpitations. Negative for claudication and leg swelling.  Gastrointestinal: Negative for abdominal pain, nausea and vomiting.  Genitourinary: Negative for dysuria and urgency.  Musculoskeletal: Positive for myalgias. Negative for back pain, joint pain and neck pain.  Skin: Negative for rash.  Neurological: Positive for dizziness. Negative for tingling, tremors, sensory change, focal weakness, weakness and headaches.  Psychiatric/Behavioral: The patient is nervous/anxious.     Patient Active Problem List   Diagnosis Date Noted  . Positive ANA (antinuclear antibody) 12/15/2016  . Muscle cramps 12/11/2016  . Fatigue 12/11/2016  . Joint pain 12/11/2016  . Tick bite with tick attached for 8 hours  or longer 11/28/2016  . Nausea 11/28/2016  . Intermittent fever 11/28/2016  . Estrogen deficiency 06/24/2016  . Osteopenia 05/18/2016  . Intractable chronic migraine without aura and without status migrainosus 03/15/2014  . Genetic testing 01/25/2014  . Breast cancer of upper-outer quadrant of right  female breast ([place]) 12/30/2013  . Hypothyroid 12/10/2011  . Adverse effects of medication 10/28/2010  . PERIODIC LIMB MOVEMENT DISORDER 08/16/2008  . HYPERSOMNIA 06/21/2008  . Somnolence, daytime 06/07/2008  . Vitamin D deficiency 04/07/2008  . Chronic UTI 11/05/2007  . Renal insufficiency 09/16/2007  . Other abnormal glucose 09/16/2007  . HEMATURIA, MICROSCOPIC, HX OF 09/16/2007  . OTHER ABNORMAL BLOOD CHEMISTRY 09/09/2007  . Hyperlipidemia 08/05/2007  . Obesity 08/05/2007  . DEPRESSIVE DISORDER 08/05/2007  . Intractable chronic migraine without aura 08/05/2007  . Coronary atherosclerosis 08/05/2007  . GERD 08/05/2007  . DEGENERATIVE DISC DISEASE, LUMBAR SPINE 08/05/2007  . EDEMA 08/05/2007    Patient's Medications  New Prescriptions   DOXYCYCLINE (VIBRA-TABS) 100 MG TABLET    Take 1 tablet (100 mg total) by mouth 2 (two) times daily for 28 days.  Previous Medications   ASPIRIN 81 MG TABLET    Take 81 mg by mouth daily.   CEPHALEXIN (KEFLEX) 250 MG CAPSULE    Take 250 mg by mouth at bedtime.    CINNAMON PO    Take 1,000 mg by mouth daily.    COENZYME Q10 (CO Q-10) 200 MG CAPS    Take 1 capsule by mouth daily.     FISH OIL-OMEGA-3 FATTY ACIDS 1000 MG CAPSULE    Take 1.2 g by mouth 2 (two) times daily.   FUROSEMIDE (LASIX) 20 MG TABLET    TAKE 1 TABLET (20 MG TOTAL) BY MOUTH DAILY.   GINKGO BILOBA 120 MG CAPS    Take 1 capsule by mouth daily.     LEVOTHYROXINE (SYNTHROID, LEVOTHROID) 125 MCG TABLET    Take 125 mcg by mouth daily before breakfast.   MAGNESIUM 250 MG TABS    Take 250 mg by mouth daily.   METOPROLOL TARTRATE (LOPRESSOR) 25 MG TABLET    Take 0.5 tablets (12.5 mg total) by mouth every 4 (four) hours as needed (As needed for palpitations).   MULTIPLE VITAMIN (MULTIVITAMIN) CAPSULE    Take 1 capsule by mouth daily.   NITROGLYCERIN (NITROSTAT) 0.4 MG SL TABLET    Place 1 tablet (0.4 mg total) under the tongue every 5 (five) minutes as needed. Pain   PANTOPRAZOLE  (PROTONIX) 40 MG TABLET    TAKE 1 TABLET (40 MG TOTAL) BY MOUTH 2 (TWO) TIMES DAILY.   PAZEO 0.7 % SOLN    Apply 1 drop to eye daily. 1 drop each eye daily.   POTASSIUM CHLORIDE SA (KLOR-CON M20) 20 MEQ TABLET    TAKE TWO TABLETS BY MOUTH TWICE DAILY AND ONE-HALF AT BEDTIME   PROPRANOLOL ER (INDERAL LA) 60 MG 24 HR CAPSULE    TAKE 1 CAPSULE (60 MG TOTAL) BY MOUTH AT BEDTIME.   RANITIDINE (ZANTAC) 300 MG TABLET    Take 1 tablet (300 mg total) by mouth 2 (two) times daily.   RIBOFLAVIN 400 MG TABS    Take 400 mg by mouth daily.   SIMVASTATIN (ZOCOR) 40 MG TABLET    TAKE ONE TABLET BY MOUTH AT BEDTIME   TOPIRAMATE (TOPAMAX) 50 MG TABLET    Take 2 tablets (100 mg total) by mouth 2 (two) times daily.   XIIDRA 5 % SOLN    Apply  1 drop to eye 2 (two) times daily. 1 drop in each eye Twice daily  Modified Medications   No medications on file  Discontinued Medications   CYCLOSPORINE (RESTASIS) 0.05 % OPHTHALMIC EMULSION    Place 1 drop into both eyes 2 (two) times daily.    Past Medical History:  Diagnosis Date  . Anorexia   . Anxiety   . Arrhythmia   . Breast cancer ([place])    ER+/PR+/Her2-  . Concussion 05/26/14  . Depression   . Frequent UTI   . GERD (gastroesophageal reflux disease)   . Hypothyroid   . Microscopic hematuria   . Migraine   . Personal history of radiation therapy   . Radiation 03/22/14-04/19/14   Breast Cancer upper-outer  . Radiation 03/2014   right breast  . Restless leg syndrome   . Seasonal allergies   . Stented coronary artery    LAD 95% 2007.  . Vitamin D deficiency   . Wears glasses     Social History   Tobacco Use  . Smoking status: Never Smoker  . Smokeless tobacco: Never Used  Substance Use Topics  . Alcohol use: No    Alcohol/week: 0.0 oz    Comment: socially  . Drug use: No    Family History  Problem Relation Age of Onset  . Coronary artery disease Mother   . Thyroid disease Mother   . Alzheimer's disease Mother   . Diabetes Mother   . Heart  disease Mother   . Cancer Brother        melnoma  . Ovarian cancer Paternal Aunt 51  . Bone cancer Paternal Uncle   . Alzheimer's disease Maternal Grandmother   . Heart disease Maternal Grandfather   . Pancreatic cancer Maternal Grandfather 14  . Ovarian cancer Maternal Aunt        dx in 77s  . Throat cancer Maternal Uncle        smoker  . Lung cancer Maternal Uncle        heavy smoker  . Ovarian cancer Paternal Aunt        dx in her 47s  . Cancer Cousin        "female cancer"  . Cancer Father      Allergies  Allergen Reactions  . Iohexol      Desc: throat selling resp distress hives.premedicate pt.entered 07/06/04, bsw.      OBJECTIVE: Vitals:   02/11/17 1448  BP: 112/74  Pulse: 60  Temp: 98.6 F (37 C)  TempSrc: Oral  Weight: 208 lb (94.3 kg)   Body mass index is 31.63 kg/m.  Physical Exam  Constitutional: She is oriented to person, place, and time and well-developed, well-nourished, and in no distress. Vital signs are normal. She does not have a sickly appearance.  HENT:  Mouth/Throat: Oropharynx is clear and moist. No oral lesions. No dental abscesses.  Eyes: Conjunctivae and EOM are normal. Pupils are equal, round, and reactive to light. No scleral icterus.  Neck: Normal range of motion.  Cardiovascular: Normal rate, regular rhythm and normal heart sounds. Exam reveals no friction rub.  No murmur heard. Pulmonary/Chest: Effort normal and breath sounds normal.  Abdominal: Soft. She exhibits no distension. There is no tenderness.  Musculoskeletal: Normal range of motion. She exhibits no tenderness.  Lymphadenopathy:    She has no cervical adenopathy.  Neurological: She is alert and oriented to person, place, and time. No cranial nerve deficit.  Skin: Skin is warm and dry. No  rash noted.  Psychiatric: Mood, affect and judgment normal.  Vitals reviewed.   ASSESSMENT & PLAN:  Problem List Items Addressed This Visit      Endocrine   Hypothyroid    Will  check TSH today to see if she may be hyperthyroid which can certainly cause her tachycardia.       Relevant Orders   TSH (Completed)   T4, free (Completed)     Other   Intermittent fever - Primary    Long discussion today regarding other strains of bacteria transmitted via ticks that are more likely here in [place] (ie: RMSF, STARI). It is possible that some of her symptoms are due to tick-borne illness although I am suspicious she has other underlying cause. Her description of initial febrile illness with meningitic symptoms is consistent with what we can see during primary infection from tick-related infection however can also be related to other causes as well. I discussed with her that I am open to trial her on course of doxycycline considering she did not have initial treatment and it sounds like she has lingering symptoms in setting of known prolonged tick bite. I explained that I am not convinced her symptoms are due to this and advised that while we trial doxy we should still consider alternatives and this may include further follow up and evaluation with rheumatology (which she is open to). She will follow up with me in 8 weeks to see how things are going.       Relevant Orders   CBC with Differential/Platelet   Lactate Dehydrogenase (LDH) (Completed)   Sedimentation rate (Completed)   HIV antibody (with reflex) (Completed)   Hepatitis panel, acute (Completed)   Positive ANA (antinuclear antibody)    Reviewed these labs with Ms. Iacobucci today and discussed how autoimmune syndromes can present with many of these similar symptoms. She saw rheumatologist and for some reason did not follow up with them. I will check sjogrens Ab today considering she is on a rx eye gtt for dry eye to determine further need for rheumatology assistance.       Relevant Orders   Sjogrens syndrome-A extractable nuclear antibody (Completed)   Sjogrens syndrome-B extractable nuclear antibody (Completed)    Other  Visit Diagnoses    Vision changes       Relevant Orders   Sjogrens syndrome-A extractable nuclear antibody (Completed)   Sjogrens syndrome-B extractable nuclear antibody (Completed)   RPR (Completed)     I spent 60 minutes with the patient including greater than 50% of time in face to face counsel and discussion with the patient regarding her history/exam, tick borne illnesses, alternative diagnoses and coordination of care for the patient.   Janene Madeira, MSN, [facility]  02/16/2017  3:23 PM

## 2017-02-11 NOTE — Patient Instructions (Signed)
I will call you when we get all of your lab results back (hopefully in 2 weeks) and we can talk about what we need to do next for you.  I would like to see records from your eye doctor so I can see more about his concerns to help with our work up.

## 2017-02-12 ENCOUNTER — Encounter: Payer: Self-pay | Admitting: Infectious Diseases

## 2017-02-12 ENCOUNTER — Telehealth: Payer: Self-pay | Admitting: Infectious Diseases

## 2017-02-12 LAB — SPECIMEN ID NOTIFICATION MISSING 2ND ID

## 2017-02-12 LAB — HEPATITIS PANEL, ACUTE
HEP B C IGM: NONREACTIVE
HEP B S AG: NONREACTIVE
HEP C AB: NONREACTIVE
Hep A IgM: NONREACTIVE
SIGNAL TO CUT-OFF: 0.01 (ref <1.00)

## 2017-02-12 LAB — SJOGRENS SYNDROME-B EXTRACTABLE NUCLEAR ANTIBODY: SSB (LA) (ENA) ANTIBODY, IGG: NEGATIVE AI

## 2017-02-12 LAB — T4, FREE: FREE T4: 0.7 ng/dL — AB (ref 0.8–1.8)

## 2017-02-12 LAB — SEDIMENTATION RATE: Sed Rate: 11 mm/h (ref 0–30)

## 2017-02-12 LAB — HIV ANTIBODY (ROUTINE TESTING W REFLEX): HIV 1&2 Ab, 4th Generation: NONREACTIVE

## 2017-02-12 LAB — TSH: TSH: 16.93 m[IU]/L — AB (ref 0.40–4.50)

## 2017-02-12 LAB — LACTATE DEHYDROGENASE: LDH: 155 U/L (ref 120–250)

## 2017-02-12 LAB — RPR: RPR: NONREACTIVE

## 2017-02-12 LAB — SJOGRENS SYNDROME-A EXTRACTABLE NUCLEAR ANTIBODY: SSA (RO) (ENA) ANTIBODY, IGG: NEGATIVE AI

## 2017-02-12 NOTE — Telephone Encounter (Signed)
Called and left voicemail regarding lab results. Would like to discuss with her personally over the phone to follow up a conversation we had in clinic during our visit.   Will try again tomorrow.   Janene Madeira, NP

## 2017-02-13 MED ORDER — DOXYCYCLINE HYCLATE 100 MG PO TABS: 100.0000 mg | ORAL_TABLET | Freq: Two times a day (BID) | ORAL | 0 refills | Status: AC

## 2017-02-13 NOTE — Progress Notes (Signed)
[place]  [phone] Fax:([phone]) [phone]  Clinic Follow Up Note   Patient Care Team: Tower, Wynelle Fanny, MD as PCP - General Minus Breeding, MD as Consulting Physician (Cardiology) Holley Bouche, NP as Nurse Practitioner (Nurse Practitioner) Erroll Luna, MD as Consulting Physician (General Surgery) Thea Silversmith, MD (Inactive) as Consulting Physician (Radiation Oncology) Truitt Merle, MD as Consulting Physician (Hematology) Susa Day, MD as Consulting Physician (Orthopedic Surgery) 02/16/2017  CHIEF COMPLAINTS Follow up stage IA right breast cancer, pT1bN0M0    Breast cancer of upper-outer quadrant of right female breast ([place])   12/29/2013 Imaging    Screening mammogram and [place]: an irregular shadowing hypoechoic mass right breast 9 o'clock location 4 cm from the nipple measuring 7 x 7 x 7 mm. No adenopathy       12/29/2013 Initial Diagnosis    Breast cancer of upper-outer quadrant of right female breast      12/29/2013 Initial Biopsy    Grade I-II IDC, ER+ (98%), PR+ (81%), HER2- (ratio 1.24), Ki67 15%       01/11/2014 Procedure    Genetic counseling/testing: Revealed 1 VUS in ATM gene, p.D1641H (c.4921G>C).  Otherwise, genetic testing negative.       02/14/2014 Surgery    Right lumpectomy with SLNB ([facility]): Grade 2 IDC spanning 0.9 cm with associated grade 2 DCIS.  Negative margins. 5 sentinel lymph nodes negative.  HER2 repeated and neg. (ratio 1.16).      02/14/2014 Pathologic Stage    pT1bpN0; Stage IA      02/14/2014 Oncotype testing    Recurrence score 21 (13% risk of distant recurrence); no adjuvant chemo offered.       03/22/2014 - 04/19/2014 Radiation Therapy    Adjuvant radiation Pablo Ledger); Right breast: Total dose 42.72 Gy over 21 fractions. Right breast boost: Total dose 10 Gy over 5 fractions.        Anti-estrogen oral therapy    Anti-estrogen therapy was recommended by Dr. Burr Medico; pt declines anti-estrogen  treatment.        05/25/2014 Survivorship    Survivorship Care Plan given to patient and reviewed with her in-person.       05/30/2016 Imaging    US Abdomen 05/30/16 IMPRESSION: Status post cholecystectomy. Pancreas not visualized due to overlying bowel gas. Increased echogenicity of hepatic parenchyma is noted diffusely suggesting fatty infiltration or other diffuse hepatocellular disease.      07/02/2016 Imaging    DEXA 07/02/16 T score of -1.8, indicating osteopenia      12/15/2016 Mammogram    IMPRESSION: 1. No mammographic evidence of malignancy in either breast. 2. Stable right breast post lumpectomy changes.       HISTORY OF PRESENTING ILLNESS (01/04/2014):  [name] [name] 62 y.o. female presents to our breast multidisciplinary clinic today because of newly diagnosed breast cancer.   This was discovered by routine screening mammogram a few weeks ago. She underwent diagnostic mammogram and ultrasound on 12/29/2013 which showed an irregular shadowing hypoechoic mass in right breast likely position measuring 7 mm. No adenopathy. She underwent core needle biopsy on the same day, which showed grade 1-2 invasive ductal adenocarcinoma, ER positive PR positive and HER-2 negative, Ki-67 10%.  She has been feeling very anxious since the biopsy. She does have history of coronary artery disease, status post stent placement in [facility] 7. Since the breast cancer diagnosis, she has had several episodes of anxiety attack, hypoventilation, with chest tightness. She appeared quite nervous when I met her first  in the clinic. Before the biopsy, she felt well in general, she does have intermittent migraine headaches, chronic leg swollen, intermittent chest pain, denies any other new symptoms, no weight loss or other changes.  She lives with her only son, who is 68 years old, on disability due to depression and suicidal ideas. She is his caregiver.  CURRENT THERAPY: Surveillance  INTERIM HISTORY: LEELAH HANNA returns for follow up. The patient reports she is doing well overall. She reports she is being seen at the infectious disease center for a possible tick bourne illness. She had full body aches and pains, low grade fever and nausea. She is now taking antibiotics (28 days total) and feels that it is starting to work. She reports continued right arm pain. She has some mild lower back pain due to a fall. She slipped while wearing socks.   On review of systems, pt denies breast pain, or any other complaints at this time. Pertinent positives are listed and detailed within the above HPI.   MEDICAL HISTORY:  Past Medical History:  Diagnosis Date  . Anorexia   . Anxiety   . Arrhythmia   . Breast cancer ([employer])    ER+/PR+/Her2-  . Concussion 05/26/14  . Depression   . Frequent UTI   . GERD (gastroesophageal reflux disease)   . Hypothyroid   . Microscopic hematuria   . Migraine   . Personal history of radiation therapy   . Radiation 03/22/14-04/19/14   Breast Cancer upper-outer  . Radiation 03/2014   right breast  . Restless leg syndrome   . Seasonal allergies   . Stented coronary artery    LAD 95% 2007.  . Vitamin D deficiency   . Wears glasses    She has psychologist Dr. Rica Mote   SURGICAL HISTORY: Past Surgical History:  Procedure Laterality Date  . ABDOMINAL HYSTERECTOMY    . APPENDECTOMY    . back surgery  1997   herniated disc repair  . BREAST LUMPECTOMY Right    2016  . CARDIAC CATHETERIZATION  2007   stent  . CHOLECYSTECTOMY OPEN    . CYSTOSCOPY     neg  . RADIOACTIVE SEED GUIDED PARTIAL MASTECTOMY WITH AXILLARY SENTINEL LYMPH NODE BIOPSY Right 02/14/2014   Procedure: RADIOACTIVE SEED GUIDED PARTIAL MASTECTOMY WITH AXILLARY SENTINEL LYMPH NODE BIOPSY;  Surgeon: Erroll Luna, MD;  Location: [facility];  Service: General;  Laterality: Right;  . SHOULDER SURGERY     Back surgery in 1997   SOCIAL HISTORY: Social History   Socioeconomic History  .  Marital status: Widowed    Spouse name: Not on file  . Number of children: 1  . Years of education: some coll.  . Highest education level: Not on file  Social Needs  . Financial resource strain: Not on file  . Food insecurity - worry: Not on file  . Food insecurity - inability: Not on file  . Transportation needs - medical: Not on file  . Transportation needs - non-medical: Not on file  Occupational History  . Occupation: not working  Tobacco Use  . Smoking status: Never Smoker  . Smokeless tobacco: Never Used  Substance and Sexual Activity  . Alcohol use: No    Alcohol/week: 0.0 oz    Comment: socially  . Drug use: No  . Sexual activity: Not Currently  Other Topics Concern  . Not on file  Social History Narrative   Does not get any regular exercise  Doctors   -uro-[place]lein   -cardio--hochrein   -Neurology--Dr. Jannifer Franklin   -allergies--Dr. Carmelina Peal   Gyn past--Dr. Warnell Forester   Couselor--Dr. Mitchum   Endo-- Dr. Chalmers Cater      Lives with son who is 61 years old   Patient is right handed.   Patient drinks 1-2 cups of caffeine daily.    FAMILY HISTORY: Family History  Problem Relation Age of Onset  . Coronary artery disease Mother   . Thyroid disease Mother   . Alzheimer's disease Mother   . Diabetes Mother   . Heart disease Mother   . Cancer Brother        melnoma  . Ovarian cancer Paternal Aunt 56  . Bone cancer Paternal Uncle   . Alzheimer's disease Maternal Grandmother   . Heart disease Maternal Grandfather   . Pancreatic cancer Maternal Grandfather 76  . Ovarian cancer Maternal Aunt        dx in 83s  . Throat cancer Maternal Uncle        smoker  . Lung cancer Maternal Uncle        heavy smoker  . Ovarian cancer Paternal Aunt        dx in her 16s  . Cancer Cousin        "female cancer"  . Cancer Father    GYN HISTORY  Menarchal: 10 LMP: 1983 hysrectomy  Contraceptive: 2 years  HRT: was on for 10-20 years, off several years  GxP1: several miscarriages      ALLERGIES:  is allergic to iohexol.  MEDICATIONS:  Allergies as of 02/16/2017      Reactions   Iohexol     Desc: throat selling resp distress hives.premedicate pt.entered 07/06/04, bsw.      Medication List        Accurate as of 02/16/17 10:58 PM. Always use your most recent med list.          aspirin 81 MG tablet Take 81 mg by mouth daily.   cephALEXin 250 MG capsule Commonly known as:  KEFLEX Take 250 mg by mouth at bedtime.   CINNAMON PO Take 1,000 mg by mouth daily.   Co Q-10 200 MG Caps Take 1 capsule by mouth daily.   doxycycline 100 MG tablet Commonly known as:  VIBRA-TABS Take 1 tablet (100 mg total) by mouth 2 (two) times daily for 28 days.   fish oil-omega-3 fatty acids 1000 MG capsule Take 1.2 g by mouth 2 (two) times daily.   furosemide 20 MG tablet Commonly known as:  LASIX TAKE 1 TABLET (20 MG TOTAL) BY MOUTH DAILY.   Ginkgo Biloba 120 MG Caps Take 1 capsule by mouth daily.   levothyroxine 125 MCG tablet Commonly known as:  SYNTHROID, LEVOTHROID Take 125 mcg by mouth daily before breakfast.   Magnesium 250 MG Tabs Take 250 mg by mouth daily.   metoprolol tartrate 25 MG tablet Commonly known as:  LOPRESSOR Take 0.5 tablets (12.5 mg total) by mouth every 4 (four) hours as needed (As needed for palpitations).   multivitamin capsule Take 1 capsule by mouth daily.   nitroGLYCERIN 0.4 MG SL tablet Commonly known as:  NITROSTAT Place 1 tablet (0.4 mg total) under the tongue every 5 (five) minutes as needed. Pain   pantoprazole 40 MG tablet Commonly known as:  PROTONIX TAKE 1 TABLET (40 MG TOTAL) BY MOUTH 2 (TWO) TIMES DAILY.   PAZEO 0.7 % Soln Generic drug:  Olopatadine HCl Apply 1 drop to eye daily. 1  drop each eye daily.   potassium chloride SA 20 MEQ tablet Commonly known as:  KLOR-CON M20 TAKE TWO TABLETS BY MOUTH TWICE DAILY AND ONE-HALF AT BEDTIME   propranolol ER 60 MG 24 hr capsule Commonly known as:  INDERAL LA TAKE 1  CAPSULE (60 MG TOTAL) BY MOUTH AT BEDTIME.   ranitidine 300 MG tablet Commonly known as:  ZANTAC Take 1 tablet (300 mg total) by mouth 2 (two) times daily.   Riboflavin 400 MG Tabs Take 400 mg by mouth daily.   simvastatin 40 MG tablet Commonly known as:  ZOCOR TAKE ONE TABLET BY MOUTH AT BEDTIME   topiramate 50 MG tablet Commonly known as:  TOPAMAX Take 2 tablets (100 mg total) by mouth 2 (two) times daily.   XIIDRA 5 % Soln Generic drug:  Lifitegrast Apply 1 drop to eye 2 (two) times daily. 1 drop in each eye Twice daily       REVIEW OF SYSTEMS:  Constitutional: Denies fevers, chills or abnormal night sweats Eyes: Denies blurriness of vision, double vision or watery eyes Ears, nose, mouth, throat, and face: Denies mucositis or sore throat Respiratory: Denies cough, dyspnea or wheezes Cardiovascular: Negative Gastrointestinal:  Denies nausea, heartburn or change in bowel habits Skin: Denies abnormal skin rashes Lymphatics: Denies new lymphadenopathy or easy bruising Neurological:Denies numbness, tingling or new weaknesses, (+) right upper arm pain, improved with surgery Behavioral/Psych: (+) very anxious Msk: (+) body aches All other systems were reviewed with the patient and are negative.  PHYSICAL EXAMINATION: ECOG PERFORMANCE STATUS: 1  Vitals:   02/16/17 1345  BP: (!) 122/54  Pulse: (!) 53  Resp: 20  Temp: 97.9 F (36.6 C)  SpO2: 100%   Filed Weights   02/16/17 1345  Weight: 209 lb 9.6 oz (95.1 kg)    GENERAL:alert, no distress and comfortable SKIN: skin color, texture, turgor are normal, no rashes or significant lesions EYES: normal, conjunctiva are pink and non-injected, sclera clear OROPHARYNX:no exudate, no erythema and lips, buccal mucosa, and tongue normal  NECK: supple, thyroid normal size, non-tender, without nodularity LYMPH:  no palpable lymphadenopathy in the cervical, axillary or inguinal LUNGS: clear to auscultation and percussion with  normal breathing effort HEART: regular rate & rhythm and no murmurs and no lower extremity edema ABDOMEN:abdomen soft, non-tender and normal bowel sounds Musculoskeletal:no cyanosis of digits and no clubbing, no significant edema on all her extremities, no tenderness of the right shoulder or arm, the range of right shoulder is normal. PSYCH: alert & oriented x 3 with fluent speech NEURO: no focal motor/sensory deficits Breasts: Breast inspection showed them to be symmetrical with no nipple discharge. Palpation of the breasts and axilla revealed no obvious mass that I could appreciate. Surgical scar in the right breast is well-healed with some scar tissue underneath.    LABORATORY DATA:  I have reviewed the data as listed Lab Results  Component Value Date   WBC 6.9 02/16/2017   HGB 13.6 02/16/2017   HCT 39.8 02/16/2017   MCV 90.7 02/16/2017   PLT 182 02/16/2017   Recent Labs    06/04/16 1519 08/15/16 1326 11/28/16 1509 02/16/17 1311  NA 138 145 138 142  K 4.6 4.8 3.7 4.1  CL 106  --  108 111*  CO2 _0 GLUCOSE 132* 129 138* 115  BUN 20 15._1 CREATININE 1.38* 1.5* 1.26* 1.39*  CALCIUM 10.1 10.4 10.0 9.8  GFRNONAA  --   --   --  40*  GFRAA  --   --   --  46*  PROT 7.3 7.5 7.1 7.7  ALBUMIN 4.5 4.2 4.3 4.2  AST _0 ALT _1 ALKPHOS 60 58 50 63  BILITOT 1.5* 1.62* 1.3* 1.2     PATHOLOGY REPORT 12/30/2013 Diagnosis 1. Breast, lumpectomy, Right 02/14/2014 - INVASIVE GRADE II DUCTAL CARCINOMA SPANNING 0.9 CM IN GREATEST DIMENSION. - ASSOCIATED INTERMEDIATE GRADE DUCTAL CARCINOMA IN SITU. - MARGINS ARE NEGATIVE. - SEE ONCOLOGY TEMPLATE. 2. Breast, excision, Right - BENIGN BREAST PARENCHYMA WITH SCATTERED ACUTE AND CHRONIC INFLAMMATION. - NO ATYPIA, HYPERPLASIA, OR MALIGNANCY IDENTIFIED. 3. Lymph node, sentinel, biopsy, Right axillary - ONE BENIGN LYMPH NODE WITH NO TUMOR SEEN (0/1). 4. Lymph node, sentinel, biopsy, Right axillary - ONE  BENIGN LYMPH NODE WITH NO TUMOR SEEN (0/1). 5. Lymph node, sentinel, biopsy, Right axillary - ONE BENIGN LYMPH NODE WITH NO TUMOR SEEN (0/1). 6. Lymph node, sentinel, biopsy, Right axillary - ONE BENIGN LYMPH NODE WITH NO TUMOR SEEN (0/1). 7. Lymph node, sentinel, biopsy, Right axillary - ONE BENIGN LYMPH NODE WITH NO TUMOR SEEN (0/1). 1. BREAST, INVASIVE TUMOR, WITH LYMPH NODES PRESENT Specimen, including laterality and lymph node sampling (sentinel, non-sentinel): Right partial breast with right axillary sentinel lymph node sampling. Procedure: Right breast lumpectomy with right axillary sentinel lymph node mapping and biopsy. Histologic type: Invasive ductal carcinoma. Grade: II. Tubule formation: 3. Nuclear pleomorphism: 2. Mitotic:1. Tumor size (gross measurement and microscopic measurement): 0.9 cm. 1 of 4 FINAL for JAMESA, TEDRICK ([id number]) Microscopic Comment(continued) Margins: Invasive, distance to closest margin: 0.4 cm (medial margin). In-situ, distance to closest margin: At least 0.4 cm (medial margin). Lymphovascular invasion: Definitive lymph/vascular invasion is not identified. Ductal carcinoma in situ: Yes, present. Grade: Intermediate grade. Extensive intraductal component: No. Lobular neoplasia: Not identified. Tumor focality: Unifocal. Treatment effect: Not applicable. Extent of tumor: Tumor confined to breast parenchyma. Skin: Not received. Nipple: Not received. Skeletal muscle: Not received. Lymph nodes: Examined: 5 Sentinel. 5 Non-sentinel. 5 Total. Lymph nodes with metastasis: 0. Isolated tumor cells (< 0.2 mm): 0. Micrometastasis: (> 0.2 mm and < 2.0 mm): 0. Macrometastasis: (> 2.0 mm): 0. Extracapsular extension: Not applicable. Breast prognostic profile: Performed on previous biopsy ([id number]) Estrogen receptor: 98%, positive. Progesterone receptor: 81%, positive. Her 2 neu by CISH: 1.24 ratio, not amplified. Ki-67:  15% Non-neoplastic breast: Fat necrosis. TNM: pT1b, pN0.  ONCOTYPE DX RS score: 21    RADIOGRAPHIC STUDIES: I have personally reviewed the radiological images as listed and agreed with the findings in the report.  Diagnostic mammogram 12/15/16 IMPRESSION: 1. No mammographic evidence of malignancy in either breast. 2. Stable right breast post lumpectomy changes.  DEXA 07/02/16 T score of -1.8, indicating osteopenia  US Abdomen 05/30/16 IMPRESSION: Status post cholecystectomy. Pancreas not visualized due to overlying bowel gas. Increased echogenicity of hepatic parenchyma is noted diffusely suggesting fatty infiltration or other diffuse hepatocellular disease.  Mm DIAG Breast Tomo Bilateral 12/13/2015 IMPRESSION: No mammographic evidence of malignancy. RECOMMENDATION: Annual diagnostic mammography.  ASSESSMENT & PLAN:  62 y.o. postmenopausal woman, with past medical history of coronary disease, anxiety and depression, who was found by screening mammogram to have a T1b N0 M0 stage I a invasive ductal carcinoma, grade I-II, ER/PR strongly positive, HER-2 negative, Ki-67 10%.  1.  pT1b N0 M0 stage IA invasive ductal carcinoma of right breast, grade II, ER/PR strongly positive, HER-2 negative, Oncotype RS 21 -She is status post lumpectomy, and adjuvant  breast radiation. -She declined adjuvant endocrine therapy, due to the concern of side effects. -We will continue the surveillance plan. She will see me every 6 months for the first few years, then annually afterwards.  -She is clinically doing well, exam was unremarkable today. Lab reviewed with her. Her last mammogram in 11/2015 was normal. No evidence of recurrence. -I encouraged her to have healthy diet and exercise regularly. See walks on a treadmill for 30 minutes every day. - Due for mammogram in November 2018. I will order this today. -mammogram on 12/12/16 revealed no evidence for malignancy. Exam was normal. No sign of  reoccurrence. MSK exam showed no concern for bony metastasis.  -Labs reviewed today (02/16/17) her Cr is elevated at 1.39 which is stable compared to other results. I advised her to drink water regularly.  -F u in 6 months   2. Osteopenia -She will continue calcium and vitamin D supplement. -last DEXA was in 06/2016, no high risk of fracture. -I advised her to take Calcium and vitamin D supplements.   3. Genetics -Given her strong family history of ovarian cancer, she was seen by genetic counselor -Negative BRCAPlus testing from [employer].  Genes tested include: BRCA1, BRCA2, CDH1, PTEN, PALB2, and TP53.  Report date is 01/25/14.   BRIP1 c.4921G>C VUS found on the CancerNext panel.  The CancerNext gene panel offered by [employer] [employer] and includes sequencing and rearrangement analysis for the following 32 genes:   APC, ATM, BARD1, BMPR1A, BRCA1, BRCA2, BRIP1, CDH1, CDK4, CDKN2A, CHEK2, EPCAM, GREM1, MLH1, MRE11A, MSH2, MSH6, MUTYH, NBN, NF1, PALB2, PMS2, POLD1, POLE, PTEN, RAD50, RAD51D, SMAD4, SMARCA4, STK11, and TP53.  The report date is: February 13, 2014.  4. Right upper arm pain -her x-ray of right humerus in April 2016 showed a 1.3 cm sclerotic lesion, possible enchondroma. -she will follow up with orthopedic surgeon Dr. Berenice Primas, she had right shoulder surgery in November 2017, she still has persistent right upper arm pain after surgery   5. Mild hyperbilirubinemia  -She has had mild intermittent elevated total bilirubin since 01/2015, liver enzymes are normal -Bilirubin is 1.62 TODAY (08/15/16). I have previously ordered an Abdominal Ultrasound to investigate this, which showed fatty liver. -Encouraged the patient to drink more fluids. Advised the patient not to drink alcohol. -We'll continue follow.  -She will contact our office if she notices dark urine or jaundiced eyes.  6. Body pain  -She has developed severe body pain, including headaches, arm or thigh pain, which migrates  -she  has been seen by PCP and ID, is currently being treated for tick bite related to infections.  Her symptoms has improved  -I have low suspicion this is related to her breast cancer, or signs of bone metastasis.  We will continue monitoring.  She knows to call me if her body pain gets worse despite antibiotics.  6. She'll continue follow-up with her primary care physician and cardiologist for her other medical problems.  Plan -Continue surveillance.  Follow up with Lacie in 6 months with labs.  I will see her in 1 year.  All questions were answered. The patient knows to call the clinic with any problems, questions or concerns.  I spent 20 minutes counseling the patient face to face. The total time spent in the appointment was 25 minutes and more than 50% was on counseling.  This document serves as a record of services personally performed by Truitt Merle, MD. It was created on her behalf by Theresia Bough, a  trained medical scribe. The creation of this record is based on the scribe's personal observations and the provider's statements to them.   I have reviewed the above documentation for accuracy and completeness, and I agree with the above.     Truitt Merle, MD 02/16/2017

## 2017-02-16 ENCOUNTER — Telehealth: Payer: Self-pay | Admitting: Hematology

## 2017-02-16 ENCOUNTER — Inpatient Hospital Stay: Payer: Medicare Other | Attending: Hematology | Admitting: Hematology

## 2017-02-16 ENCOUNTER — Inpatient Hospital Stay: Payer: Medicare Other

## 2017-02-16 VITALS — BP 122/54 | HR 53 | Temp 97.9°F | Resp 20 | Ht 68.0 in | Wt 209.6 lb

## 2017-02-16 DIAGNOSIS — Z853 Personal history of malignant neoplasm of breast: Secondary | ICD-10-CM

## 2017-02-16 DIAGNOSIS — C50411 Malignant neoplasm of upper-outer quadrant of right female breast: Secondary | ICD-10-CM

## 2017-02-16 DIAGNOSIS — M8580 Other specified disorders of bone density and structure, unspecified site: Secondary | ICD-10-CM | POA: Insufficient documentation

## 2017-02-16 DIAGNOSIS — Z8041 Family history of malignant neoplasm of ovary: Secondary | ICD-10-CM | POA: Insufficient documentation

## 2017-02-16 DIAGNOSIS — M79621 Pain in right upper arm: Secondary | ICD-10-CM | POA: Insufficient documentation

## 2017-02-16 DIAGNOSIS — E559 Vitamin D deficiency, unspecified: Secondary | ICD-10-CM

## 2017-02-16 DIAGNOSIS — R52 Pain, unspecified: Secondary | ICD-10-CM | POA: Insufficient documentation

## 2017-02-16 DIAGNOSIS — Z170 Estrogen receptor positive status [ER+]: Secondary | ICD-10-CM | POA: Diagnosis not present

## 2017-02-16 LAB — COMPREHENSIVE METABOLIC PANEL
ALBUMIN: 4.2 g/dL (ref 3.5–5.0)
ALK PHOS: 63 U/L (ref 40–150)
ALT: 22 U/L (ref 0–55)
ANION GAP: 9 (ref 3–11)
AST: 22 U/L (ref 5–34)
BILIRUBIN TOTAL: 1.2 mg/dL (ref 0.2–1.2)
BUN: 15 mg/dL (ref 7–26)
CALCIUM: 9.8 mg/dL (ref 8.4–10.4)
CO2: 22 mmol/L (ref 22–29)
Chloride: 111 mmol/L — ABNORMAL HIGH (ref 98–109)
Creatinine, Ser: 1.39 mg/dL — ABNORMAL HIGH (ref 0.60–1.10)
GFR calc Af Amer: 46 mL/min — ABNORMAL LOW (ref >60)
GFR calc non Af Amer: 40 mL/min — ABNORMAL LOW (ref >60)
Glucose, Bld: 115 mg/dL (ref 70–140)
Potassium: 4.1 mmol/L (ref 3.3–4.7)
Sodium: 142 mmol/L (ref 136–145)
TOTAL PROTEIN: 7.7 g/dL (ref 6.4–8.3)

## 2017-02-16 LAB — CBC WITH DIFFERENTIAL/PLATELET
BASOS ABS: 0 10*3/uL (ref 0.0–0.1)
BASOS PCT: 0 %
EOS ABS: 0.2 10*3/uL (ref 0.0–0.5)
Eosinophils Relative: 3 %
HEMATOCRIT: 39.8 % (ref 34.8–46.6)
HEMOGLOBIN: 13.6 g/dL (ref 11.6–15.9)
Lymphocytes Relative: 25 %
Lymphs Abs: 1.7 10*3/uL (ref 0.9–3.3)
MCH: 31 pg (ref 25.1–34.0)
MCHC: 34.2 g/dL (ref 31.5–36.0)
MCV: 90.7 fL (ref 79.5–101.0)
MONOS PCT: 8 %
Monocytes Absolute: 0.5 10*3/uL (ref 0.1–0.9)
NEUTROS ABS: 4.5 10*3/uL (ref 1.5–6.5)
NEUTROS PCT: 64 %
Platelets: 182 10*3/uL (ref 145–400)
RBC: 4.39 MIL/uL (ref 3.70–5.45)
RDW: 13.1 % (ref 11.2–16.1)
WBC: 6.9 10*3/uL (ref 3.9–10.3)

## 2017-02-16 NOTE — Assessment & Plan Note (Signed)
Long discussion today regarding other strains of bacteria transmitted via ticks that are more likely here in [place] (ie: RMSF, STARI). It is possible that some of her symptoms are due to tick-borne illness although I am suspicious she has other underlying cause. Her description of initial febrile illness with meningitic symptoms is consistent with what we can see during primary infection from tick-related infection however can also be related to other causes as well. I discussed with her that I am open to trial her on course of doxycycline considering she did not have initial treatment and it sounds like she has lingering symptoms in setting of known prolonged tick bite. I explained that I am not convinced her symptoms are due to this and advised that while we trial doxy we should still consider alternatives and this may include further follow up and evaluation with rheumatology (which she is open to). She will follow up with me in 8 weeks to see how things are going.

## 2017-02-16 NOTE — Telephone Encounter (Signed)
Gave patient avs and calendar with appts per 1/21 los.  °

## 2017-02-16 NOTE — Assessment & Plan Note (Signed)
Reviewed these labs with Ms. Couvillon today and discussed how autoimmune syndromes can present with many of these similar symptoms. She saw rheumatologist and for some reason did not follow up with them. I will check sjogrens Ab today considering she is on a rx eye gtt for dry eye to determine further need for rheumatology assistance.

## 2017-02-16 NOTE — Assessment & Plan Note (Signed)
Will check TSH today to see if she may be hyperthyroid which can certainly cause her tachycardia.

## 2017-02-17 ENCOUNTER — Encounter: Payer: Self-pay | Admitting: Hematology

## 2017-03-03 DIAGNOSIS — E559 Vitamin D deficiency, unspecified: Secondary | ICD-10-CM | POA: Diagnosis not present

## 2017-03-03 DIAGNOSIS — E890 Postprocedural hypothyroidism: Secondary | ICD-10-CM | POA: Diagnosis not present

## 2017-03-18 DIAGNOSIS — H16143 Punctate keratitis, bilateral: Secondary | ICD-10-CM | POA: Diagnosis not present

## 2017-03-29 ENCOUNTER — Other Ambulatory Visit: Payer: Self-pay | Admitting: Cardiology

## 2017-03-30 ENCOUNTER — Other Ambulatory Visit: Payer: Self-pay | Admitting: Infectious Diseases

## 2017-03-30 DIAGNOSIS — R509 Fever, unspecified: Secondary | ICD-10-CM

## 2017-03-31 ENCOUNTER — Other Ambulatory Visit: Payer: Self-pay | Admitting: *Deleted

## 2017-03-31 MED ORDER — PROPRANOLOL HCL ER 60 MG PO CP24: ORAL_CAPSULE | ORAL | 0 refills | Status: DC

## 2017-04-07 ENCOUNTER — Ambulatory Visit (INDEPENDENT_AMBULATORY_CARE_PROVIDER_SITE_OTHER): Payer: Medicare Other | Admitting: Infectious Diseases

## 2017-04-07 ENCOUNTER — Encounter: Payer: Self-pay | Admitting: Infectious Diseases

## 2017-04-07 DIAGNOSIS — W57XXXD Bitten or stung by nonvenomous insect and other nonvenomous arthropods, subsequent encounter: Secondary | ICD-10-CM

## 2017-04-07 DIAGNOSIS — S80861D Insect bite (nonvenomous), right lower leg, subsequent encounter: Secondary | ICD-10-CM

## 2017-04-07 NOTE — Patient Instructions (Addendum)
You look much better today. No need for further antibiotics. Continue to work with your health care team with your thyroid and headaches.   No further follow up needed.   Tick Bite Information, Adult Ticks are insects that can bite. Most ticks live in shrubs and grassy areas. They climb onto people and animals that go by. Then they bite. Some ticks carry germs that can make you sick. How can I prevent tick bites?  Use an insect repellent that has 20% or higher of the ingredients DEET, picaridin, or IR3535. Put this insect repellent on: ? Bare skin. ? The tops of your boots. ? Your pant legs. ? The ends of your sleeves.  If you use an insect repellent that has the ingredient permethrin, make sure to follow the instructions on the bottle. Treat the following: ? Clothing. ? Supplies. ? Boots. ? Tents.  Wear long sleeves, long pants, and light colors.  Tuck your pant legs into your socks.  Stay in the middle of the trail.  Try not to walk through long grass.  Before going inside your house, check your clothes, hair, and skin for ticks. Make sure to check your head, neck, armpits, waist, groin, and joint areas.  Check for ticks every day.  When you come indoors: ? Wash your clothes right away. ? Shower right away. ? Dry your clothes in a dryer on high heat for 60 minutes or more. What is the right way to remove a tick? Remove a tick from your skin as soon as possible.  To remove a tick that is crawling on your skin: ? Go outdoors and brush the tick off. ? Use tape or a lint roller.  To remove a tick that is biting: ? Wash your hands. ? If you have latex gloves, put them on. ? Use tweezers, curved forceps, or a tick-removal tool to grasp the tick. Grasp the tick as close to your skin and as close to the tick's head as possible. ? Gently pull up until the tick lets go.  Try to keep the tick's head attached to its body.  Do not twist or jerk the tick.  Do not squeeze or  crush the tick.  Do not try to remove a tick with heat, alcohol, petroleum jelly, or fingernail polish. How should I get rid of a tick? Here are some ways to get rid of a tick that is alive:  Place the tick in rubbing alcohol.  Place the tick in a bag or container you can close tightly.  Wrap the tick tightly in tape.  Flush the tick down the toilet.  Contact a doctor if:  You have symptoms of a disease, such as: ? Pain in a muscle, joint, or bone. ? Trouble walking or moving your legs. ? Numbness in your legs. ? Inability to move (paralysis). ? A red rash that makes a circle (bull's-eye rash). ? Redness and swelling where the tick bit you. ? A fever. ? Throwing up (vomiting) over and over. ? Diarrhea. ? Weight loss. ? Tender and swollen lymph glands. ? Shortness of breath. ? Cough. ? Belly pain (abdominal pain). ? Headache. ? Being more tired than normal. ? A change in how alert (conscious) you are. ? Confusion. Get help right away if:  You cannot remove a tick.  A part of a tick breaks off and gets stuck in your skin.  You are feeling worse. Summary  Ticks may carry germs that can make you sick.  To prevent tick bites, wear long sleeves, long pants, and light colors. Use insect repellent. Follow the instructions on the bottle.  If the tick is biting, do not try to remove it with heat, alcohol, petroleum jelly, or fingernail polish.  Use tweezers, curved forceps, or a tick-removal tool to grasp the tick. Gently pull up until the tick lets go. Do not twist or jerk the tick. Do not squeeze or crush the tick.  If you have symptoms, contact a doctor. This information is not intended to replace advice given to you by your health care provider. Make sure you discuss any questions you have with your health care provider. Document Released: 04/09/2009 Document Revised: 04/25/2016 Document Reviewed: 04/25/2016 Elsevier Interactive Patient Education  2018 [employer].

## 2017-04-07 NOTE — Progress Notes (Signed)
Name: [name] [name]  DOB: 1955/03/27  MRN: [id number] PCP: Abner Greenspan, MD   Chief Complaint  Patient presents with  . Follow-up    tick bite treatment    Patient Active Problem List   Diagnosis Date Noted  . Positive ANA (antinuclear antibody) 12/15/2016  . Muscle cramps 12/11/2016  . Fatigue 12/11/2016  . Nausea 11/28/2016  . Estrogen deficiency 06/24/2016  . Osteopenia 05/18/2016  . Intractable chronic migraine without aura and without status migrainosus 03/15/2014  . Genetic testing 01/25/2014  . Breast cancer of upper-outer quadrant of right female breast ([place]) 12/30/2013  . Hypothyroid 12/10/2011  . Adverse effects of medication 10/28/2010  . PERIODIC LIMB MOVEMENT DISORDER 08/16/2008  . HYPERSOMNIA 06/21/2008  . Somnolence, daytime 06/07/2008  . Vitamin D deficiency 04/07/2008  . Chronic UTI 11/05/2007  . Renal insufficiency 09/16/2007  . Other abnormal glucose 09/16/2007  . HEMATURIA, MICROSCOPIC, HX OF 09/16/2007  . OTHER ABNORMAL BLOOD CHEMISTRY 09/09/2007  . Hyperlipidemia 08/05/2007  . Obesity 08/05/2007  . DEPRESSIVE DISORDER 08/05/2007  . Intractable chronic migraine without aura 08/05/2007  . Coronary atherosclerosis 08/05/2007  . GERD 08/05/2007  . DEGENERATIVE DISC DISEASE, LUMBAR SPINE 08/05/2007  . EDEMA 08/05/2007   HPI: [name] [name] is back today after having completed 28 days of doxycycline for possible tick-related illness. She is here today and feeling "worlds better." She has had no further fevers, vision improved and she has increased energy. Her headaches actually went away while on doxycycline therapy however they have slowly started to return (however these were present prior to tick bite). She tolerated the antibiotics very well without side effects. Afraid to go outside to a point for fear of re-infection.   Review of Systems  Constitutional: Negative for fever, malaise/fatigue and weight loss.  HENT: Negative for hearing loss,  sore throat and tinnitus.   Eyes: Negative for blurred vision, photophobia and pain.  Respiratory: Negative for cough and shortness of breath.   Cardiovascular: Positive for palpitations. Negative for chest pain, claudication and leg swelling.  Gastrointestinal: Negative for abdominal pain, nausea and vomiting.  Genitourinary: Negative for dysuria and urgency.  Musculoskeletal: Negative for back pain, joint pain, myalgias and neck pain.  Skin: Negative for rash.  Neurological: Negative for dizziness, tingling, tremors, sensory change, focal weakness, weakness and headaches.  Psychiatric/Behavioral: The patient is nervous/anxious and has insomnia.    Past Medical History:  Diagnosis Date  . Anorexia   . Anxiety   . Arrhythmia   . Breast cancer ([place])    ER+/PR+/Her2-  . Concussion 05/26/14  . Depression   . Frequent UTI   . GERD (gastroesophageal reflux disease)   . Hypothyroid   . Microscopic hematuria   . Migraine   . Personal history of radiation therapy   . Radiation 03/22/14-04/19/14   Breast Cancer upper-outer  . Radiation 03/2014   right breast  . Restless leg syndrome   . Seasonal allergies   . Stented coronary artery    LAD 95% 2007.  . Vitamin D deficiency   . Wears glasses     Social History   Tobacco Use  . Smoking status: Never Smoker  . Smokeless tobacco: Never Used  Substance Use Topics  . Alcohol use: No    Alcohol/week: 0.0 oz    Comment: socially  . Drug use: No    Family History  Problem Relation Age of Onset  . Coronary artery disease Mother   . Thyroid disease Mother   .  Alzheimer's disease Mother   . Diabetes Mother   . Heart disease Mother   . Cancer Brother        melnoma  . Ovarian cancer Paternal Aunt 51  . Bone cancer Paternal Uncle   . Alzheimer's disease Maternal Grandmother   . Heart disease Maternal Grandfather   . Pancreatic cancer Maternal Grandfather 56  . Ovarian cancer Maternal Aunt        dx in 39s  . Throat cancer  Maternal Uncle        smoker  . Lung cancer Maternal Uncle        heavy smoker  . Ovarian cancer Paternal Aunt        dx in her 32s  . Cancer Cousin        "female cancer"  . Cancer Father      Allergies  Allergen Reactions  . Iohexol      Desc: throat selling resp distress hives.premedicate pt.entered 07/06/04, bsw.     OBJECTIVE: Vitals:   04/07/17 1421  BP: 111/66  Pulse: (!) 54  Temp: 98 F (36.7 C)  TempSrc: Oral  Weight: 204 lb (92.5 kg)   Body mass index is 31.02 kg/m.  Physical Exam  Constitutional: She is oriented to person, place, and time and well-developed, well-nourished, and in no distress. Vital signs are normal. She does not have a sickly appearance.  HENT:  Mouth/Throat: Oropharynx is clear and moist. No oral lesions. No dental abscesses.  Eyes: Conjunctivae and EOM are normal. Pupils are equal, round, and reactive to light. No scleral icterus.  Neck: Normal range of motion.  Cardiovascular: Regular rhythm and normal heart sounds. Bradycardia present. Exam reveals no friction rub.  No murmur heard. Pulmonary/Chest: Effort normal and breath sounds normal.  Musculoskeletal: Normal range of motion. She exhibits no tenderness.  Neurological: She is alert and oriented to person, place, and time. No cranial nerve deficit.  Skin: Skin is warm and dry. No rash noted.  Psychiatric: Mood, affect and judgment normal.  Vitals reviewed.  ASSESSMENT & PLAN:  Problem List Items Addressed This Visit      Musculoskeletal and Integument   RESOLVED: Tick bite    Symptoms appear to be much improved after 28 day course of doxycycline. She still has some intermittent headaches but these are baseline. Her energy is much better and reports she is no longer having fevers. I discussed with her that although we do not have enough clinical data to fully support tick-related illness she had many symptoms that would argue in favor of this. I discussed that as with many other  infectious diseases there are likely to be some post-infectious changes to expect.   No recommended follow up at this time. I counseled her about proper prevention of future tick bites and need to check her self frequently as the risk with tick-transmitted bacteria comes with exposure time.         Encouraged continued follow up with her primary care, cardiology, neurology, endocrinology and oncology teams to continue caring for her other conditions.   Janene Madeira, MSN, [facility]  04/07/2017  2:47 PM

## 2017-04-07 NOTE — Assessment & Plan Note (Signed)
Symptoms appear to be much improved after 28 day course of doxycycline. She still has some intermittent headaches but these are baseline. [name] [name] energy is much better and reports she is no longer having fevers. I discussed with [name] [name] that although we do not have enough clinical data to fully support tick-related illness she had many symptoms that would argue in favor of this. I discussed that as with many other infectious diseases there are likely to be some post-infectious changes to expect.   No recommended follow up at this time. I counseled [name] [name] about proper prevention of future tick bites and need to check [name] [name] self frequently as the risk with tick-transmitted bacteria comes with exposure time.

## 2017-04-10 ENCOUNTER — Encounter: Payer: Self-pay | Admitting: Cardiology

## 2017-04-14 ENCOUNTER — Ambulatory Visit: Payer: Medicare Other | Admitting: Cardiology

## 2017-04-14 DIAGNOSIS — E890 Postprocedural hypothyroidism: Secondary | ICD-10-CM | POA: Diagnosis not present

## 2017-04-16 ENCOUNTER — Telehealth: Payer: Self-pay | Admitting: *Deleted

## 2017-04-16 NOTE — Telephone Encounter (Signed)
Received fax from [place] re: Topamax 50 mg is on backorder. Donnita Falls, NP signed fax to okay change to 100 mg tabs, disp #60. Faxed back to [place].

## 2017-04-19 ENCOUNTER — Encounter: Payer: Self-pay | Admitting: Cardiology

## 2017-04-19 NOTE — Progress Notes (Signed)
HPI The patient presents for followup of her known coronary disease.  In 2017 she had a Lexiscan Myoview prior to shoulder surgery.  This was low risk.   At the last visit she complained of palpitations.   She had a Holter and she was found to have rare PVCs/PACs that did not clearly correlate with her symptoms.  She was instructed to take beta blockers for symptomatic treatment.   She has not really wanted to take the beta-blocker.  She is confused on when to take it.  She says the heart rate goes up and it comes back down and she is not sure that the beta-blocker helps.  She cannot really quantify how long it is elevated.  On the monitor we only saw it go up 7 rate beats with a supraventricular tachycardia and then it would come right back down.  She had isolated ectopic beats as well.  She did not have any symptoms that she reported at that time.  She has not had any presyncope or syncope.  She is not having any chest pressure, neck or arm discomfort.  Allergies  Allergen Reactions  . Iohexol      Desc: throat selling resp distress hives.premedicate pt.entered 07/06/04, bsw.     Current Outpatient Medications  Medication Sig Dispense Refill  . aspirin 81 MG tablet Take 81 mg by mouth daily.    . cephALEXin (KEFLEX) 250 MG capsule Take 250 mg by mouth at bedtime.     Marland Kitchen CINNAMON PO Take 1,000 mg by mouth daily.     . Coenzyme Q10 (CO Q-10) 200 MG CAPS Take 1 capsule by mouth daily.      . fish oil-omega-3 fatty acids 1000 MG capsule Take 1.2 g by mouth 2 (two) times daily.    . furosemide (LASIX) 20 MG tablet TAKE 1 TABLET (20 MG TOTAL) BY MOUTH DAILY. 90 tablet 3  . Ginkgo Biloba 120 MG CAPS Take 1 capsule by mouth daily.      Marland Kitchen levothyroxine (SYNTHROID, LEVOTHROID) 125 MCG tablet Take 125 mcg by mouth daily before breakfast.    . levothyroxine (SYNTHROID, LEVOTHROID) 137 MCG tablet     . Magnesium 250 MG TABS Take 250 mg by mouth daily.    . Multiple Vitamin (MULTIVITAMIN) capsule Take  1 capsule by mouth daily.    . nitroGLYCERIN (NITROSTAT) 0.4 MG SL tablet Place 1 tablet (0.4 mg total) under the tongue every 5 (five) minutes as needed. Pain 25 tablet 3  . pantoprazole (PROTONIX) 40 MG tablet TAKE 1 TABLET (40 MG TOTAL) BY MOUTH 2 (TWO) TIMES DAILY. 180 tablet 3  . PAZEO 0.7 % SOLN Apply 1 drop to eye daily. 1 drop each eye daily.    . potassium chloride SA (KLOR-CON M20) 20 MEQ tablet TAKE TWO TABLETS BY MOUTH TWICE DAILY AND ONE-HALF AT BEDTIME 405 tablet 3  . propranolol ER (INDERAL LA) 60 MG 24 hr capsule TAKE 1 CAPSULE BY MOUTH ONCE DAILY AT BEDTIME 90 capsule 0  . ranitidine (ZANTAC) 300 MG tablet Take 1 tablet (300 mg total) by mouth 2 (two) times daily. 180 tablet 3  . Riboflavin 400 MG TABS Take 400 mg by mouth daily.    . simvastatin (ZOCOR) 40 MG tablet TAKE ONE TABLET BY MOUTH AT BEDTIME 90 tablet 3  . topiramate (TOPAMAX) 100 MG tablet Take 1 tablet by mouth 2 (two) times daily.  11  . XIIDRA 5 % SOLN Apply 1 drop to eye  2 (two) times daily. 1 drop in each eye Twice daily    . metoprolol tartrate (LOPRESSOR) 25 MG tablet Take 0.5 tablets (12.5 mg total) by mouth every 4 (four) hours as needed (As needed for palpitations). 30 tablet 5   No current facility-administered medications for this visit.     Past Medical History:  Diagnosis Date  . Anxiety   . Breast cancer ([place])    ER+/PR+/Her2-  . Concussion 05/26/14  . Depression   . Frequent UTI   . GERD (gastroesophageal reflux disease)   . Hypothyroid   . Microscopic hematuria   . Migraine   . Radiation 03/22/14-04/19/14   Breast Cancer upper-outer  . Restless leg syndrome   . Seasonal allergies   . Stented coronary artery    LAD 95% 2007.  . Vitamin D deficiency     Past Surgical History:  Procedure Laterality Date  . ABDOMINAL HYSTERECTOMY    . APPENDECTOMY    . back surgery  1997   herniated disc repair  . BREAST LUMPECTOMY Right    2016  . CARDIAC CATHETERIZATION  2007   stent  .  CHOLECYSTECTOMY OPEN    . CYSTOSCOPY     neg  . RADIOACTIVE SEED GUIDED PARTIAL MASTECTOMY WITH AXILLARY SENTINEL LYMPH NODE BIOPSY Right 02/14/2014   Procedure: RADIOACTIVE SEED GUIDED PARTIAL MASTECTOMY WITH AXILLARY SENTINEL LYMPH NODE BIOPSY;  Surgeon: Erroll Luna, MD;  Location: [facility];  Service: General;  Laterality: Right;  . SHOULDER SURGERY      ROS:   As stated in the HPI and negative for all other systems.  PHYSICAL EXAM BP 105/64   Pulse (!) 59   Ht _0  (1.727 m)   Wt 205 lb 9.6 oz (93.3 kg)   BMI 31.26 kg/m   GENERAL:  Well appearing NECK:  No jugular venous distention, waveform within normal limits, carotid upstroke brisk and symmetric, no bruits, no thyromegaly LUNGS:  Clear to auscultation bilaterally CHEST:  Unremarkable HEART:  PMI not displaced or sustained,S1 and S2 within normal limits, no S3, no S4, no clicks, no rubs, no murmurs ABD:  Flat, positive bowel sounds normal in frequency in pitch, no bruits, no rebound, no guarding, no midline pulsatile mass, no hepatomegaly, no splenomegaly EXT:  2 plus pulses throughout, no edema, no cyanosis no clubbing   EKG:  NA  Lab Results  Component Value Date   CHOL 143 06/04/2016   TRIG 121.0 06/04/2016   HDL 50.30 06/04/2016   LDLCALC 68 06/04/2016     ASSESSMENT AND PLAN   Palpitations - The patient does have palpitations as described.  We captured some nonsustained very short runs of supraventricular arrhythmia.  She is going to try to record any longer runs that she might have on her apple watch.  She does not want to take the beta-blocker at this point and I do not think we need to but we talked at length about how she might use this as a pill in pocket approach.    CAD - She is had no new symptoms since her stress test in 2017 which was negative for ischemia.   DYSLIPIDEMIA -  She had an excellent lipid profile.  No change in therapy.

## 2017-04-21 ENCOUNTER — Ambulatory Visit (INDEPENDENT_AMBULATORY_CARE_PROVIDER_SITE_OTHER): Payer: Medicare Other | Admitting: Cardiology

## 2017-04-21 ENCOUNTER — Encounter: Payer: Self-pay | Admitting: Cardiology

## 2017-04-21 VITALS — BP 105/64 | HR 59 | Ht 68.0 in | Wt 205.6 lb

## 2017-04-21 DIAGNOSIS — R002 Palpitations: Secondary | ICD-10-CM | POA: Diagnosis not present

## 2017-04-21 DIAGNOSIS — E785 Hyperlipidemia, unspecified: Secondary | ICD-10-CM | POA: Diagnosis not present

## 2017-04-21 DIAGNOSIS — I2510 Atherosclerotic heart disease of native coronary artery without angina pectoris: Secondary | ICD-10-CM | POA: Diagnosis not present

## 2017-04-21 NOTE — Patient Instructions (Signed)
Medication Instructions: Your physician recommends that you continue on your current medications as directed. Please refer to the Current Medication list given to you today.  If you need a refill on your cardiac medications before your next appointment, please call your pharmacy.   Follow-Up: Your physician wants you to follow-up in 12 months with Dr. Hochrein. You will receive a reminder letter in the mail two months in advance. If you don't receive a letter, please call our office at [phone] to schedule this follow-up appointment.   Thank you for choosing [facility]!!      

## 2017-05-04 ENCOUNTER — Encounter (INDEPENDENT_AMBULATORY_CARE_PROVIDER_SITE_OTHER): Payer: Self-pay

## 2017-05-25 ENCOUNTER — Encounter: Payer: Self-pay | Admitting: Family Medicine

## 2017-05-25 ENCOUNTER — Telehealth: Payer: Self-pay | Admitting: Family Medicine

## 2017-05-25 NOTE — Telephone Encounter (Signed)
Copied from [place] [id number]. Topic: Quick Communication - See Telephone Encounter >> May 25, 2017  5:23 PM [name], NT wrote: CRM for notification. See Telephone encounter for: 05/25/17. Patient called and states that she got a letter in the mail to reschedule her AWV. CB#: [phone] Prefers any afteroon.

## 2017-05-26 NOTE — Telephone Encounter (Signed)
[name] [name] when can this patient be rescheduled?  She has an appointment on 06/18/17 at 2:30. She has an appointment for her physical the next week.

## 2017-06-18 ENCOUNTER — Ambulatory Visit: Payer: Medicare Other

## 2017-06-19 ENCOUNTER — Ambulatory Visit: Payer: Medicare Other

## 2017-06-26 ENCOUNTER — Ambulatory Visit (INDEPENDENT_AMBULATORY_CARE_PROVIDER_SITE_OTHER): Payer: Medicare Other | Admitting: Family Medicine

## 2017-06-26 ENCOUNTER — Ambulatory Visit (INDEPENDENT_AMBULATORY_CARE_PROVIDER_SITE_OTHER): Payer: Medicare Other

## 2017-06-26 ENCOUNTER — Encounter: Payer: Self-pay | Admitting: Family Medicine

## 2017-06-26 VITALS — BP 120/62 | HR 65 | Temp 98.1°F | Ht 68.0 in | Wt 209.0 lb

## 2017-06-26 DIAGNOSIS — R7309 Other abnormal glucose: Secondary | ICD-10-CM

## 2017-06-26 DIAGNOSIS — Z170 Estrogen receptor positive status [ER+]: Secondary | ICD-10-CM

## 2017-06-26 DIAGNOSIS — E785 Hyperlipidemia, unspecified: Secondary | ICD-10-CM

## 2017-06-26 DIAGNOSIS — Z0000 Encounter for general adult medical examination without abnormal findings: Secondary | ICD-10-CM

## 2017-06-26 DIAGNOSIS — C50411 Malignant neoplasm of upper-outer quadrant of right female breast: Secondary | ICD-10-CM

## 2017-06-26 DIAGNOSIS — N289 Disorder of kidney and ureter, unspecified: Secondary | ICD-10-CM | POA: Diagnosis not present

## 2017-06-26 DIAGNOSIS — Z6831 Body mass index (BMI) 31.0-31.9, adult: Secondary | ICD-10-CM | POA: Diagnosis not present

## 2017-06-26 DIAGNOSIS — E6609 Other obesity due to excess calories: Secondary | ICD-10-CM | POA: Diagnosis not present

## 2017-06-26 DIAGNOSIS — E559 Vitamin D deficiency, unspecified: Secondary | ICD-10-CM

## 2017-06-26 DIAGNOSIS — E039 Hypothyroidism, unspecified: Secondary | ICD-10-CM

## 2017-06-26 DIAGNOSIS — M85851 Other specified disorders of bone density and structure, right thigh: Secondary | ICD-10-CM

## 2017-06-26 MED ORDER — PANTOPRAZOLE SODIUM 40 MG PO TBEC: DELAYED_RELEASE_TABLET | ORAL | 3 refills | Status: DC

## 2017-06-26 MED ORDER — POTASSIUM CHLORIDE CRYS ER 20 MEQ PO TBCR: EXTENDED_RELEASE_TABLET | ORAL | 3 refills | Status: DC

## 2017-06-26 MED ORDER — RANITIDINE HCL 300 MG PO TABS: 300.0000 mg | ORAL_TABLET | Freq: Two times a day (BID) | ORAL | 3 refills | Status: DC

## 2017-06-26 NOTE — Patient Instructions (Addendum)
Ms. Rief , Thank you for taking time to come for your Medicare Wellness Visit. I appreciate your ongoing commitment to your health goals. Please review the following plan we discussed and let me know if I can assist you in the future.   These are the goals we discussed: Goals    . Patient Stated     Starting 06/26/2017, I will continue to take medications as prescribed.        This is a list of the screening recommended for you and due dates:  Health Maintenance  Topic Date Due  . Tetanus Vaccine  06/27/2018*  . Pap Smear  06/05/2026*  . Flu Shot  08/27/2017  . Mammogram  12/15/2017  . Colon Cancer Screening  10/04/2018  .  Hepatitis C: One time screening is recommended by Center for Disease Control  (CDC) for  adults born from [birth year] through 1966.   Completed  . HIV Screening  Completed  *Topic was postponed. The date shown is not the original due date.   Preventive Care for Adults  A healthy lifestyle and preventive care can promote health and wellness. Preventive health guidelines for adults include the following key practices.  . A routine yearly physical is a good way to check with your health care provider about your health and preventive screening. It is a chance to share any concerns and updates on your health and to receive a thorough exam.  . Visit your dentist for a routine exam and preventive care every 6 months. Brush your teeth twice a day and floss once a day. Good oral hygiene prevents tooth decay and gum disease.  . The frequency of eye exams is based on your age, health, family medical history, use  of contact lenses, and other factors. Follow your health care provider's recommendations for frequency of eye exams.  . Eat a healthy diet. Foods like vegetables, fruits, whole grains, low-fat dairy products, and lean protein foods contain the nutrients you need without too many calories. Decrease your intake of foods high in solid fats, added sugars, and salt. Eat the  right amount of calories for you. Get information about a proper diet from your health care provider, if necessary.  . Regular physical exercise is one of the most important things you can do for your health. Most adults should get at least 150 minutes of moderate-intensity exercise (any activity that increases your heart rate and causes you to sweat) each week. In addition, most adults need muscle-strengthening exercises on 2 or more days a week.  Silver Sneakers may be a benefit available to you. To determine eligibility, you may visit the website: www.silversneakers.com or contact program at (315)487-4832 Mon-Fri between 8AM-8PM.   . Maintain a healthy weight. The body mass index (BMI) is a screening tool to identify possible weight problems. It provides an estimate of body fat based on height and weight. Your health care provider can find your BMI and can help you achieve or maintain a healthy weight.   For adults 20 years and older: ? A BMI below 18.5 is considered underweight. ? A BMI of 18.5 to 24.9 is normal. ? A BMI of 25 to 29.9 is considered overweight. ? A BMI of 30 and above is considered obese.   . Maintain normal blood lipids and cholesterol levels by exercising and minimizing your intake of saturated fat. Eat a balanced diet with plenty of fruit and vegetables. Blood tests for lipids and cholesterol should begin at age 63 and  be repeated every 5 years. If your lipid or cholesterol levels are high, you are over 50, or you are at high risk for heart disease, you may need your cholesterol levels checked more frequently. Ongoing high lipid and cholesterol levels should be treated with medicines if diet and exercise are not working.  . If you smoke, find out from your health care provider how to quit. If you do not use tobacco, please do not start.  . If you choose to drink alcohol, please do not consume more than 2 drinks per day. One drink is considered to be 12 ounces (355 mL) of  beer, 5 ounces (148 mL) of wine, or 1.5 ounces (44 mL) of liquor.  . If you are 73-38 years old, ask your health care provider if you should take aspirin to prevent strokes.  . Use sunscreen. Apply sunscreen liberally and repeatedly throughout the day. You should seek shade when your shadow is shorter than you. Protect yourself by wearing Novacek sleeves, pants, a wide-brimmed hat, and sunglasses year round, whenever you are outdoors.  . Once a month, do a whole body skin exam, using a mirror to look at the skin on your back. Tell your health care provider of new moles, moles that have irregular borders, moles that are larger than a pencil eraser, or moles that have changed in shape or color.

## 2017-06-26 NOTE — Patient Instructions (Addendum)
If you are interested in the new shingles vaccine (Shingrix) - call your local pharmacy to check on coverage and availability -- ask your oncologist if you can get this vaccine safely    We will refer you for endocrinology for thyroid   Labs today   Medicare interview today

## 2017-06-26 NOTE — Progress Notes (Signed)
PCP notes:   Health maintenance:  Tetanus vaccine - postponed/insurance  Abnormal screenings:   Hearing - failed  Hearing Screening   125Hz  250Hz  500Hz  1000Hz  2000Hz  3000Hz  4000Hz  6000Hz  8000Hz   Right ear:   40 40 0  0    Left ear:   40 40 0  0      Patient concerns:   None  Nurse concerns:  None  Next PCP appt:   N/A; CPE prior to AWV  I reviewed health advisor's note, was available for consultation, and agree with documentation and plan. Loura Pardon MD

## 2017-06-26 NOTE — Progress Notes (Signed)
Subjective:   [name] [name] is a 62 y.o. female who presents for Medicare Annual (Subsequent) preventive examination.  Review of Systems:  N/A Cardiac Risk Factors include: advanced age (>31mn, >>99women);dyslipidemia;obesity (BMI >30kg/m2)     Objective:     Vitals: BP 120/62   Pulse 65   Temp 98.1 F (36.7 C) (Oral)   Ht '5\' 8"'  (1.727 m) Comment: shoes  Wt 209 lb (94.8 kg)   SpO2 98%   BMI 31.78 kg/m   Body mass index is 31.78 kg/m.  Advanced Directives 06/26/2017 02/16/2017 08/15/2016 06/04/2016 05/16/2016 05/10/2015 11/09/2014  Does Patient Have a Medical Advance Directive? No No No No No No No  Would patient like information on creating a medical advance directive? No - Patient declined - No - Patient declined - - No - patient declined information -    Tobacco Social History   Tobacco Use  Smoking Status Never Smoker  Smokeless Tobacco Never Used     Counseling given: No   Clinical Intake:  Pre-visit preparation completed: Yes  Pain : No/denies pain Pain Score: 0-No pain     Nutritional Status: BMI > 30  Obese Nutritional Risks: None Diabetes: No  How often do you need to have someone help you when you read instructions, pamphlets, or other written materials from your doctor or pharmacy?: 1 - Never What is the last grade level you completed in school?: 12TH GRADE + 2 [employer] Interpreter Needed?: No  Comments: PT IS A WIDOW AND LIVES WITH SON Information entered by :: LPinson, LPN  Past Medical History:  Diagnosis Date  . Anxiety   . Breast cancer ([place]    ER+/PR+/Her2-  . Concussion 05/26/14  . Depression   . Frequent UTI   . GERD (gastroesophageal reflux disease)   . Hypothyroid   . Microscopic hematuria   . Migraine   . Radiation 03/22/14-04/19/14   Breast Cancer upper-outer  . Restless leg syndrome   . Seasonal allergies   . Stented coronary artery    LAD 95% 2007.  . Vitamin D deficiency    Past Surgical History:  Procedure  Laterality Date  . ABDOMINAL HYSTERECTOMY    . APPENDECTOMY    . back surgery  1997   herniated disc repair  . BREAST LUMPECTOMY Right    2016  . CARDIAC CATHETERIZATION  2007   stent  . CHOLECYSTECTOMY OPEN    . CYSTOSCOPY     neg  . RADIOACTIVE SEED GUIDED PARTIAL MASTECTOMY WITH AXILLARY SENTINEL LYMPH NODE BIOPSY Right 02/14/2014   Procedure: RADIOACTIVE SEED GUIDED PARTIAL MASTECTOMY WITH AXILLARY SENTINEL LYMPH NODE BIOPSY;  Surgeon: TErroll Luna MD;  [facility]  Service: General;  Laterality: Right;  . SHOULDER SURGERY     Family History  Problem Relation Age of Onset  . Coronary artery disease Mother   . Thyroid disease Mother   . Alzheimer's disease Mother   . Diabetes Mother   . Heart disease Mother   . Cancer Brother        melnoma  . Ovarian cancer Paternal Aunt 446 . Bone cancer Paternal Uncle   . Alzheimer's disease Maternal Grandmother   . Heart disease Maternal Grandfather   . Pancreatic cancer Maternal Grandfather 573 . Ovarian cancer Maternal Aunt        dx in 658s . Throat cancer Maternal Uncle        smoker  . Lung cancer Maternal Uncle  heavy smoker  . Ovarian cancer Paternal Aunt        dx in her 92s  . Cancer Cousin        "female cancer"  . Cancer Father    Social History   Socioeconomic History  . Marital status: Widowed    Spouse name: Not on file  . Number of children: 1  . Years of education: some coll.  . Highest education level: Not on file  Occupational History  . 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 Financial resource strain: Not on file  . Food insecurity:    Worry: Not on file    Inability: Not on file  . Transportation needs:    Medical: Not on file    Non-medical: Not on file  Tobacco Use  . Smoking status: Never Smoker  . Smokeless tobacco: Never Used  Substance and Sexual Activity  . Alcohol use: No    Alcohol/week: 0.0 oz    Comment: socially  . Drug use: No  . Sexual  activity: Not Currently  Lifestyle  . Physical activity:    Days per week: Not on file    Minutes per session: Not on file  . Stress: Not on file  Relationships  . Social connections:    Talks on phone: Not on file    Gets together: Not on file    Attends religious service: Not on file    Active member of club or organization: Not on file    Attends meetings of clubs or organizations: Not on file    Relationship status: Not on file  Other Topics Concern  . Not on file  Social History Narrative   Does not get any regular exercise      Doctors   -uro-[facility]   -cardio--hochrein   -Neurology--Dr. Jannifer Franklin   -allergies--Dr. Carmelina Peal   Gyn past--Dr. Warnell Forester   [place]ouselor--Dr. Mitchum   Endo-- Dr. Chalmers Cater      Lives with son who is 6 years old   Patient is right handed.   Patient drinks 1-2 cups of caffeine daily.    Outpatient Encounter Medications as of 06/26/2017  Medication Sig  . aspirin 81 MG tablet Take 81 mg by mouth daily.  . cephALEXin (KEFLEX) 250 MG capsule Take 250 mg by mouth at bedtime.   Marland Kitchen CINNAMON PO Take 1,000 mg by mouth daily.   . Coenzyme Q10 (CO Q-10) 200 MG CAPS Take 1 capsule by mouth daily.    . fish oil-omega-3 fatty acids 1000 MG capsule Take 1.2 g by mouth 2 (two) times daily.  . furosemide (LASIX) 20 MG tablet TAKE 1 TABLET (20 MG TOTAL) BY MOUTH DAILY.  . Ginkgo Biloba 120 MG CAPS Take 1 capsule by mouth daily.    Marland Kitchen levothyroxine (SYNTHROID, LEVOTHROID) 125 MCG tablet Take 125 mcg by mouth daily before breakfast.  . levothyroxine (SYNTHROID, LEVOTHROID) 137 MCG tablet   . Magnesium 250 MG TABS Take 250 mg by mouth daily.  . Multiple Vitamin (MULTIVITAMIN) capsule Take 1 capsule by mouth daily.  . nitroGLYCERIN (NITROSTAT) 0.4 MG SL tablet Place 1 tablet (0.4 mg total) under the tongue every 5 (five) minutes as needed. Pain  . PAZEO 0.7 % SOLN Apply 1 drop to eye daily. 1 drop each eye daily.  . propranolol ER (INDERAL LA) 60 MG 24 hr capsule TAKE 1  CAPSULE BY MOUTH ONCE DAILY AT BEDTIME  . Riboflavin 400 MG TABS Take 400 mg by mouth daily.  Marland Kitchen  simvastatin (ZOCOR) 40 MG tablet TAKE ONE TABLET BY MOUTH AT BEDTIME  . topiramate (TOPAMAX) 100 MG tablet Take 1 tablet by mouth 2 (two) times daily.  Marland Kitchen XIIDRA 5 % SOLN Apply 1 drop to eye 2 (two) times daily. 1 drop in each eye Twice daily  . [DISCONTINUED] pantoprazole (PROTONIX) 40 MG tablet TAKE 1 TABLET (40 MG TOTAL) BY MOUTH 2 (TWO) TIMES DAILY.  . [DISCONTINUED] potassium chloride SA (KLOR-CON M20) 20 MEQ tablet TAKE TWO TABLETS BY MOUTH TWICE DAILY AND ONE-HALF AT BEDTIME  . [DISCONTINUED] ranitidine (ZANTAC) 300 MG tablet Take 1 tablet (300 mg total) by mouth 2 (two) times daily.  . metoprolol tartrate (LOPRESSOR) 25 MG tablet Take 0.5 tablets (12.5 mg total) by mouth every 4 (four) hours as needed (As needed for palpitations).   No facility-administered encounter medications on file as of 06/26/2017.     Activities of Daily Living In your present state of health, do you have any difficulty performing the following activities: 06/26/2017  Hearing? N  Vision? Y  Comment cataracts and grading of lens  Difficulty concentrating or making decisions? N  Walking or climbing stairs? Y  Dressing or bathing? N  Doing errands, shopping? N  Preparing Food and eating ? N  Using the Toilet? N  In the past six months, have you accidently leaked urine? Y  Do you have problems with loss of bowel control? N  Managing your Medications? N  Managing your Finances? N  Housekeeping or managing your Housekeeping? N  Some recent data might be hidden    Patient Care Team: Tower, Wynelle Fanny, MD as PCP - General Minus Breeding, MD as Consulting Physician (Cardiology) Holley Bouche, NP as Nurse Practitioner (Nurse Practitioner) Erroll Luna, MD as Consulting Physician (General Surgery) Thea Silversmith, MD as Consulting Physician (Radiation Oncology) Truitt Merle, MD as Consulting Physician  (Hematology) Susa Day, MD as Consulting Physician (Orthopedic Surgery)    Assessment:   This is a routine wellness examination for [place].   Hearing Screening   '125Hz'  '250Hz'  '500Hz'  '1000Hz'  '2000Hz'  '3000Hz'  '4000Hz'  '6000Hz'  '8000Hz'   Right ear:   40 40 0  0    Left ear:   40 40 0  0    Vision Screening Comments: Vision exam in Feb 2019 with Dr. Nicki Reaper  Exercise Activities and Dietary recommendations Current Exercise Habits: The patient does not participate in regular exercise at present, Exercise limited by: Other - see comments(vision and balance concerns)  Goals    . Patient Stated     Starting 06/26/2017, I will continue to take medications as prescribed.        Fall Risk Fall Risk  06/26/2017 02/11/2017 06/04/2016 01/04/2014  Falls in the past year? No Yes Yes No  Comment - - 3-4 over previous 12 months; pt states she feels it is medication related -  Number falls in past yr: - 1 2 or more -  Injury with Fall? - No Yes -  Comment - - scratches and black eye -   Depression Screen PHQ 2/9 Scores 06/26/2017 02/11/2017 06/04/2016  PHQ - 2 Score 0 0 1  PHQ- 9 Score 0 - 10     Cognitive Function MMSE - Mini Mental State Exam 06/26/2017 06/04/2016  Orientation to time 5 5  Orientation to Place 5 5  Registration 3 3  Attention/ Calculation 0 0  Recall 3 3  Language- name 2 objects 0 0  Language- repeat 1 1  Language- follow 3 step  command 3 3  Language- read & follow direction 0 0  Write a sentence 0 0  Copy design 0 0  Total score 20 20       PLEASE NOTE: A Mini-Cog screen was completed. Maximum score is 20. A value of 0 denotes this part of [place] MMSE was not completed or the patient failed this part of the Mini-Cog screening.   Mini-Cog Screening Orientation to Time - Max 5 pts Orientation to Place - Max 5 pts Registration - Max 3 pts Recall - Max 3 pts Language Repeat - Max 1 pts Language Follow 3 Step Command - Max 3 pts   Immunization History  Administered Date(s)  Administered  . Influenza,inj,Quad PF,6+ Mos 11/15/2015  . Td 01/27/2006    Screening Tests Health Maintenance  Topic Date Due  . TETANUS/TDAP  06/27/2018 (Originally 01/28/2016)  . PAP SMEAR  06/05/2026 (Originally 07/23/1975)  . INFLUENZA VACCINE  08/27/2017  . MAMMOGRAM  12/15/2017  . COLONOSCOPY  10/04/2018  . Hepatitis C Screening  Completed  . HIV Screening  Completed       Plan:     I have personally reviewed, addressed, and noted the following in the patient's chart:  A. Medical and social history B. Use of alcohol, tobacco or illicit drugs  C. Current medications and supplements D. Functional ability and status E.  Nutritional status F.  Physical activity G. Advance directives H. List of other physicians I.  Hospitalizations, surgeries, and ER visits in previous 12 months J.  [place] to include hearing, vision, cognitive, depression L. Referrals and appointments - none  In addition, I have reviewed and discussed with patient certain preventive protocols, quality metrics, and best practice recommendations. A written personalized care plan for preventive services as well as general preventive health recommendations were provided to patient.  See attached scanned questionnaire for additional information.   Signed,   Lindell Noe, MHA, BS, LPN Health Coach

## 2017-06-26 NOTE — Progress Notes (Signed)
Subjective:    Patient ID: [name] [name], female    DOB: [birth year], 62 y.o.   MRN: [id number]  HPI  Here for health maintenance exam and to review chronic medical problems    Hanging in there  Still trying to recover from everything that has happened since nov  Feels a lot better from the fall   Has had tick related illness  Already found 2 ticks this year (did not bite her)    Has eye surgery upcoming - sees her doctor next week  Dizzy all the time due to vision  Stays nauseated   Still having terrible headaches- blames that on eyes also   Wt Readings from Last 3 Encounters:  06/26/17 209 lb (94.8 kg)  04/21/17 205 lb 9.6 oz (93.3 kg)  04/07/17 204 lb (92.5 kg)  cannot exercise due to the chronic dizziness  Diet -varies/appetite is not great  31.78 kg/m    Has amw visit pending today   Still sees her counselor for depression  Mammogram 11/18  Nl (lumpectomy in the past)- with diag mm due a year from then Personal hx of breast cancer R  Self breast exam - no changes  She sees oncol regularly and keeps check   Colonoscopy 9/10 - diverticulosis  10 year recall   Zoster status -unsure if she is safe to get the vaccine    Hypothyroidism  Pt has no clinical changes No change in energy level/ hair or skin/ edema and no tremor Lab Results  Component Value Date   TSH 16.93 (H) 02/11/2017    Dose was inc  Sees endocrinology  Osteopenia  dexa 6/18    T score -1.8 FN Falls-none  Fractures -none  She takes her ca and D   Renal insuff takex keflex for chronic uti  Lab Results  Component Value Date   CREATININE 1.39 (H) 02/16/2017   BUN 15 02/16/2017   NA 142 02/16/2017   K 4.1 02/16/2017   CL 111 (H) 02/16/2017   CO2 22 02/16/2017   BP BP Readings from Last 3 Encounters:  06/26/17 120/62  04/21/17 105/64  04/07/17 111/66   Pulse Readings from Last 3 Encounters:  06/26/17 65  04/21/17 (!) 59  04/07/17 (!) 54    Hyperlipidemia with hx of  CAD Lab Results  Component Value Date   CHOL 143 06/04/2016   HDL 50.30 06/04/2016   LDLCALC 68 06/04/2016   TRIG 121.0 06/04/2016   CHOLHDL 3 06/04/2016   On statin and diet   Glucose control Lab Results  Component Value Date   HGBA1C 5.8 (H) 03/28/2016  eating sweets - ate a bunch of taffy for mothers day   Vit D 42.3 a year ago  Takes her D  Patient Active Problem List   Diagnosis Date Noted  . Palpitation 04/21/2017  . Coronary artery disease involving native coronary artery of native heart without angina pectoris 04/21/2017  . Dyslipidemia 04/21/2017  . Positive ANA (antinuclear antibody) 12/15/2016  . Muscle cramps 12/11/2016  . Fatigue 12/11/2016  . Nausea 11/28/2016  . Estrogen deficiency 06/24/2016  . Osteopenia 05/18/2016  . Intractable chronic migraine without aura and without status migrainosus 03/15/2014  . Genetic testing 01/25/2014  . Breast cancer of upper-outer quadrant of right female breast ([place]) 12/30/2013  . Hypothyroid 12/10/2011  . Adverse effects of medication 10/28/2010  . Routine general medical examination at a health care facility 09/30/2010  . PERIODIC LIMB MOVEMENT DISORDER 08/16/2008  . HYPERSOMNIA  06/21/2008  . Somnolence, daytime 06/07/2008  . Vitamin D deficiency 04/07/2008  . Chronic UTI 11/05/2007  . Renal insufficiency 09/16/2007  . Other abnormal glucose 09/16/2007  . HEMATURIA, MICROSCOPIC, HX OF 09/16/2007  . OTHER ABNORMAL BLOOD CHEMISTRY 09/09/2007  . Hyperlipidemia 08/05/2007  . Obesity 08/05/2007  . DEPRESSIVE DISORDER 08/05/2007  . Intractable chronic migraine without aura 08/05/2007  . Coronary atherosclerosis 08/05/2007  . GERD 08/05/2007  . DEGENERATIVE DISC DISEASE, LUMBAR SPINE 08/05/2007  . EDEMA 08/05/2007   Past Medical History:  Diagnosis Date  . Anxiety   . Breast cancer ([place])    ER+/PR+/Her2-  . Concussion 05/26/14  . Depression   . Frequent UTI   . GERD (gastroesophageal reflux disease)   .  Hypothyroid   . Microscopic hematuria   . Migraine   . Radiation 03/22/14-04/19/14   Breast Cancer upper-outer  . Restless leg syndrome   . Seasonal allergies   . Stented coronary artery    LAD 95% 2007.  . Vitamin D deficiency    Past Surgical History:  Procedure Laterality Date  . ABDOMINAL HYSTERECTOMY    . APPENDECTOMY    . back surgery  1997   herniated disc repair  . BREAST LUMPECTOMY Right    2016  . CARDIAC CATHETERIZATION  2007   stent  . CHOLECYSTECTOMY OPEN    . CYSTOSCOPY     neg  . RADIOACTIVE SEED GUIDED PARTIAL MASTECTOMY WITH AXILLARY SENTINEL LYMPH NODE BIOPSY Right 02/14/2014   Procedure: RADIOACTIVE SEED GUIDED PARTIAL MASTECTOMY WITH AXILLARY SENTINEL LYMPH NODE BIOPSY;  Surgeon: Erroll Luna, MD;  Location: [facility];  Service: General;  Laterality: Right;  . SHOULDER SURGERY     Social History   Tobacco Use  . Smoking status: Never Smoker  . Smokeless tobacco: Never Used  Substance Use Topics  . Alcohol use: No    Alcohol/week: 0.0 oz    Comment: socially  . Drug use: No   Family History  Problem Relation Age of Onset  . Coronary artery disease Mother   . Thyroid disease Mother   . Alzheimer's disease Mother   . Diabetes Mother   . Heart disease Mother   . Cancer Brother        melnoma  . Ovarian cancer Paternal Aunt 6  . Bone cancer Paternal Uncle   . Alzheimer's disease Maternal Grandmother   . Heart disease Maternal Grandfather   . Pancreatic cancer Maternal Grandfather 71  . Ovarian cancer Maternal Aunt        dx in 68s  . Throat cancer Maternal Uncle        smoker  . Lung cancer Maternal Uncle        heavy smoker  . Ovarian cancer Paternal Aunt        dx in her 61s  . Cancer Cousin        "female cancer"  . Cancer Father    Allergies  Allergen Reactions  . Iohexol      Desc: throat selling resp distress hives.premedicate pt.entered 07/06/04, bsw.    Current Outpatient Medications on File Prior to Visit    Medication Sig Dispense Refill  . aspirin 81 MG tablet Take 81 mg by mouth daily.    . cephALEXin (KEFLEX) 250 MG capsule Take 250 mg by mouth at bedtime.     Marland Kitchen CINNAMON PO Take 1,000 mg by mouth daily.     . Coenzyme Q10 (CO Q-10) 200 MG CAPS Take 1 capsule  by mouth daily.      . fish oil-omega-3 fatty acids 1000 MG capsule Take 1.2 g by mouth 2 (two) times daily.    . furosemide (LASIX) 20 MG tablet TAKE 1 TABLET (20 MG TOTAL) BY MOUTH DAILY. 90 tablet 3  . Ginkgo Biloba 120 MG CAPS Take 1 capsule by mouth daily.      Marland Kitchen levothyroxine (SYNTHROID, LEVOTHROID) 137 MCG tablet     . Magnesium 250 MG TABS Take 250 mg by mouth daily.    . Multiple Vitamin (MULTIVITAMIN) capsule Take 1 capsule by mouth daily.    . nitroGLYCERIN (NITROSTAT) 0.4 MG SL tablet Place 1 tablet (0.4 mg total) under the tongue every 5 (five) minutes as needed. Pain 25 tablet 3  . PAZEO 0.7 % SOLN Apply 1 drop to eye daily. 1 drop each eye daily.    . propranolol ER (INDERAL LA) 60 MG 24 hr capsule TAKE 1 CAPSULE BY MOUTH ONCE DAILY AT BEDTIME 90 capsule 0  . Riboflavin 400 MG TABS Take 400 mg by mouth daily.    . simvastatin (ZOCOR) 40 MG tablet TAKE ONE TABLET BY MOUTH AT BEDTIME 90 tablet 3  . topiramate (TOPAMAX) 100 MG tablet Take 1 tablet by mouth 2 (two) times daily.  11  . XIIDRA 5 % SOLN Apply 1 drop to eye 2 (two) times daily. 1 drop in each eye Twice daily    . levothyroxine (SYNTHROID, LEVOTHROID) 125 MCG tablet Take 125 mcg by mouth daily before breakfast.    . metoprolol tartrate (LOPRESSOR) 25 MG tablet Take 0.5 tablets (12.5 mg total) by mouth every 4 (four) hours as needed (As needed for palpitations). 30 tablet 5   No current facility-administered medications on file prior to visit.     Review of Systems  Constitutional: Positive for fatigue. Negative for activity change, appetite change, fever and unexpected weight change.  HENT: Negative for congestion, ear pain, rhinorrhea, sinus pressure and sore  throat.   Eyes: Positive for visual disturbance. Negative for pain and redness.  Respiratory: Negative for cough, shortness of breath and wheezing.   Cardiovascular: Negative for chest pain and palpitations.  Gastrointestinal: Negative for abdominal pain, blood in stool, constipation and diarrhea.  Endocrine: Negative for polydipsia and polyuria.  Genitourinary: Negative for dysuria, frequency and urgency.  Musculoskeletal: Positive for arthralgias. Negative for back pain and myalgias.  Skin: Negative for pallor and rash.  Allergic/Immunologic: Negative for environmental allergies.  Neurological: Positive for headaches. Negative for dizziness, syncope and facial asymmetry.  Hematological: Negative for adenopathy. Does not bruise/bleed easily.  Psychiatric/Behavioral: Positive for dysphoric mood. Negative for decreased concentration, self-injury and suicidal ideas. The patient is nervous/anxious.        Objective:   Physical Exam  Constitutional: She appears well-developed and well-nourished. No distress.  obese and well appearing   HENT:  Head: Normocephalic and atraumatic.  Right Ear: External ear normal.  Left Ear: External ear normal.  Nose: Nose normal.  Mouth/Throat: Oropharynx is clear and moist.  Eyes: Pupils are equal, round, and reactive to light. Conjunctivae and EOM are normal. Right eye exhibits no discharge. Left eye exhibits no discharge. No scleral icterus.  Neck: Normal range of motion. Neck supple. No JVD present. Carotid bruit is not present. No thyromegaly present.  Cardiovascular: Normal rate, regular rhythm, normal heart sounds and intact distal pulses. Exam reveals no gallop.  Pulmonary/Chest: Effort normal and breath sounds normal. No respiratory distress. She has no wheezes. She has no rales.  Good air exch  Abdominal: Soft. Bowel sounds are normal. She exhibits no distension and no mass. There is no tenderness.  Musculoskeletal: She exhibits no edema or  tenderness.  Lymphadenopathy:    She has no cervical adenopathy.  Neurological: She is alert. She has normal reflexes. She displays normal reflexes. No cranial nerve deficit. She exhibits normal muscle tone. Coordination normal.  Skin: Skin is warm and dry. No rash noted. No erythema. No pallor.  Fair  Solar lentigines diffusely   Psychiatric: She is not actively hallucinating. She exhibits a depressed mood.  Pt voices frustration over health problems  She is attentive.          Assessment & Plan:   Problem List Items Addressed This Visit      Endocrine   Hypothyroid    Pt would like to change endocrinology providers Ref done  TSH today        Relevant Orders   TSH (Completed)   Ambulatory referral to Endocrinology     Musculoskeletal and Integument   Osteopenia    dexa 6/18  No falls or fx On ca and D Exercise depends on tolerance of it         Genitourinary   Renal insufficiency    Lab today  Has frequent uti -on suppressive tx  Enc water intake      Relevant Orders   CBC with Differential/Platelet (Completed)   Comprehensive metabolic panel (Completed)     Other   Breast cancer of upper-outer quadrant of right female breast ([place])    Doing well  Continues f/u with oncol mammo due in nov      Hyperlipidemia    Disc goals for lipids and reasons to control them Rev last labs with pt Rev low sat fat diet in detail  LDL of 68  Statin and diet -controlled in setting of CAD Lab today      Relevant Orders   Comprehensive metabolic panel (Completed)   Lipid panel (Completed)   Obesity    Discussed how this problem influences overall health and the risks it imposes  Reviewed plan for weight loss with lower calorie diet (via better food choices and also portion control or program like weight watchers) and exercise building up to or more than 30 minutes 5 days per week including some aerobic activity         Other abnormal glucose    Last A1C 5.8    disc imp of low glycemic diet and wt loss to prevent DM2 Lab today      Relevant Orders   Hemoglobin A1c (Completed)   Routine general medical examination at a health care facility - Primary    Reviewed health habits including diet and exercise and skin cancer prevention Reviewed appropriate screening tests for age  Also reviewed health mt list, fam hx and immunization status , as well as social and family history   See HPI amw later today  Labs today  She will see if she is a candidate for shingrix from her oncologist  Ref to endo for hypothyroid  Due mammo in nov      Vitamin D deficiency (Chronic)    Level today  Disc imp to bone and overall health      Relevant Orders   VITAMIN D 25 Hydroxy (Vit-D Deficiency, Fractures) (Completed)    Other Visit Diagnoses    Dyslipidemia

## 2017-06-27 LAB — LIPID PANEL
CHOL/HDL RATIO: 3.1 (calc) (ref <5.0)
CHOLESTEROL: 150 mg/dL (ref <200)
HDL: 48 mg/dL — ABNORMAL LOW (ref >50)
LDL CHOLESTEROL (CALC): 74 mg/dL
Non-HDL Cholesterol (Calc): 102 mg/dL (calc) (ref <130)
TRIGLYCERIDES: 188 mg/dL — AB (ref <150)

## 2017-06-27 LAB — CBC WITH DIFFERENTIAL/PLATELET
Basophils Absolute: 32 cells/uL (ref 0–200)
Basophils Relative: 0.5 %
EOS PCT: 2.5 %
Eosinophils Absolute: 160 cells/uL (ref 15–500)
HEMATOCRIT: 38.5 % (ref 35.0–45.0)
HEMOGLOBIN: 13.3 g/dL (ref 11.7–15.5)
Lymphs Abs: 1792 cells/uL (ref 850–3900)
MCH: 30.1 pg (ref 27.0–33.0)
MCHC: 34.5 g/dL (ref 32.0–36.0)
MCV: 87.1 fL (ref 80.0–100.0)
MONOS PCT: 9.9 %
MPV: 9.8 fL (ref 7.5–12.5)
NEUTROS ABS: 3782 {cells}/uL (ref 1500–7800)
Neutrophils Relative %: 59.1 %
Platelets: 186 10*3/uL (ref 140–400)
RBC: 4.42 10*6/uL (ref 3.80–5.10)
RDW: 12.6 % (ref 11.0–15.0)
Total Lymphocyte: 28 %
WBC mixed population: 634 cells/uL (ref 200–950)
WBC: 6.4 10*3/uL (ref 3.8–10.8)

## 2017-06-27 LAB — COMPREHENSIVE METABOLIC PANEL
AG Ratio: 1.6 (calc) (ref 1.0–2.5)
ALKALINE PHOSPHATASE (APISO): 65 U/L (ref 33–130)
ALT: 25 U/L (ref 6–29)
AST: 22 U/L (ref 10–35)
Albumin: 4.2 g/dL (ref 3.6–5.1)
BILIRUBIN TOTAL: 1.2 mg/dL (ref 0.2–1.2)
BUN/Creatinine Ratio: 16 (calc) (ref 6–22)
BUN: 19 mg/dL (ref 7–25)
CO2: 23 mmol/L (ref 20–32)
Calcium: 9.9 mg/dL (ref 8.6–10.4)
Chloride: 108 mmol/L (ref 98–110)
Creat: 1.2 mg/dL — ABNORMAL HIGH (ref 0.50–0.99)
Globulin: 2.6 g/dL (calc) (ref 1.9–3.7)
Glucose, Bld: 107 mg/dL — ABNORMAL HIGH (ref 65–99)
POTASSIUM: 4.4 mmol/L (ref 3.5–5.3)
Sodium: 139 mmol/L (ref 135–146)
Total Protein: 6.8 g/dL (ref 6.1–8.1)

## 2017-06-27 LAB — VITAMIN D 25 HYDROXY (VIT D DEFICIENCY, FRACTURES): Vit D, 25-Hydroxy: 27 ng/mL — ABNORMAL LOW (ref 30–100)

## 2017-06-27 LAB — HEMOGLOBIN A1C
Hgb A1c MFr Bld: 5.7 % of total Hgb — ABNORMAL HIGH (ref <5.7)
Mean Plasma Glucose: 117 (calc)
eAG (mmol/L): 6.5 (calc)

## 2017-06-27 LAB — TSH: TSH: 0.23 mIU/L — ABNORMAL LOW (ref 0.40–4.50)

## 2017-06-28 NOTE — Assessment & Plan Note (Signed)
Last A1C 5.8  disc imp of low glycemic diet and wt loss to prevent DM2 Lab today

## 2017-06-28 NOTE — Assessment & Plan Note (Signed)
Reviewed health habits including diet and exercise and skin cancer prevention Reviewed appropriate screening tests for age  Also reviewed health mt list, fam hx and immunization status , as well as social and family history   See HPI amw later today  Labs today  She will see if she is a candidate for shingrix from her oncologist  Ref to endo for hypothyroid  Due mammo in nov

## 2017-06-28 NOTE — Assessment & Plan Note (Signed)
Lab today  Has frequent uti -on suppressive tx  Enc water intake

## 2017-06-28 NOTE — Assessment & Plan Note (Signed)
dexa 6/18  No falls or fx On ca and D Exercise depends on tolerance of it

## 2017-06-28 NOTE — Assessment & Plan Note (Signed)
Discussed how this problem influences overall health and the risks it imposes  Reviewed plan for weight loss with lower calorie diet (via better food choices and also portion control or program like weight watchers) and exercise building up to or more than 30 minutes 5 days per week including some aerobic activity    

## 2017-06-28 NOTE — Assessment & Plan Note (Signed)
Pt would like to change endocrinology providers Ref done  TSH today

## 2017-06-28 NOTE — Assessment & Plan Note (Signed)
Disc goals for lipids and reasons to control them Rev last labs with pt Rev low sat fat diet in detail  LDL of 68  Statin and diet -controlled in setting of CAD Lab today

## 2017-06-28 NOTE — Assessment & Plan Note (Signed)
Disc goals for lipids and reasons to control them Rev last labs with pt Rev low sat fat diet in detail  Statin and diet- last LDL 68 Hx of CAD Lab today

## 2017-06-28 NOTE — Assessment & Plan Note (Signed)
Doing well  Continues f/u with oncol mammo due in nov

## 2017-06-28 NOTE — Assessment & Plan Note (Signed)
Level today  Disc imp to bone and overall health

## 2017-06-29 ENCOUNTER — Encounter: Payer: Self-pay | Admitting: Family Medicine

## 2017-06-30 ENCOUNTER — Other Ambulatory Visit: Payer: Self-pay

## 2017-06-30 DIAGNOSIS — H02831 Dermatochalasis of right upper eyelid: Secondary | ICD-10-CM | POA: Diagnosis not present

## 2017-06-30 DIAGNOSIS — H25043 Posterior subcapsular polar age-related cataract, bilateral: Secondary | ICD-10-CM | POA: Diagnosis not present

## 2017-06-30 DIAGNOSIS — H18413 Arcus senilis, bilateral: Secondary | ICD-10-CM | POA: Diagnosis not present

## 2017-06-30 DIAGNOSIS — H2513 Age-related nuclear cataract, bilateral: Secondary | ICD-10-CM | POA: Diagnosis not present

## 2017-06-30 DIAGNOSIS — H2511 Age-related nuclear cataract, right eye: Secondary | ICD-10-CM | POA: Diagnosis not present

## 2017-06-30 MED ORDER — SIMVASTATIN 40 MG PO TABS: 40.0000 mg | ORAL_TABLET | Freq: Every day | ORAL | 3 refills | Status: DC

## 2017-07-02 ENCOUNTER — Telehealth: Payer: Self-pay

## 2017-07-02 NOTE — Telephone Encounter (Signed)
    [facility] Pre-operative Risk Assessment    Request for surgical clearance:  1. What type of surgery is being performed? Right cataract extraction   2. When is this surgery scheduled? 08/10/17  3. What type of clearance is required (medical clearance vs. Pharmacy clearance to hold med vs. Both)? Both  4. Are there any medications that need to be held prior to surgery and how long? Aspirin  5. Practice name and name of physician performing surgery? [facility]  6. What is your office phone number [phone]   7.   What is your office fax number [phone]  8.   Anesthesia type (None, local, MAC, general) ? Karie Soda 07/02/2017, 5:20 PM  _________________________________________________________________   (provider comments below)

## 2017-07-03 NOTE — Telephone Encounter (Signed)
   Primary Cardiologist: Dr Percival Spanish  Chart reviewed as part of pre-operative protocol coverage. Given past medical history and time since last visit, based on ACC/AHA guidelines, [name] [name] would be at acceptable risk for the planned procedure without further cardiovascular testing.   In general we prefer aspirin not be held pre op but will defer this decision to the surgeon.   I will route this recommendation to the requesting party via Epic fax function and remove from pre-op pool.  Please call with questions.  Kerin Ransom, PA-C 07/03/2017, 10:59 AM

## 2017-07-06 ENCOUNTER — Encounter: Payer: Self-pay | Admitting: Neurology

## 2017-07-06 ENCOUNTER — Ambulatory Visit (INDEPENDENT_AMBULATORY_CARE_PROVIDER_SITE_OTHER): Payer: Medicare Other | Admitting: Neurology

## 2017-07-06 VITALS — BP 110/65 | HR 57 | Ht 68.0 in | Wt 205.5 lb

## 2017-07-06 DIAGNOSIS — G43719 Chronic migraine without aura, intractable, without status migrainosus: Secondary | ICD-10-CM

## 2017-07-06 MED ORDER — ERENUMAB-AOOE 140 MG/ML ~~LOC~~ SOAJ: 140.0000 mg | SUBCUTANEOUS | 4 refills | Status: DC

## 2017-07-06 NOTE — Progress Notes (Signed)
Reason for visit: Migraine headache  [name] [name] is an 62 y.o. female  History of present illness:  [name] is a 62 year old right-handed white female with a history of intractable migraine headache.  The patient has been recently diagnosed with some form of a tick borne disease, she was treated with doxycycline and she claims that while on the antibiotics her headaches seemed to improve.  The patient now is having headache frequency returning back to her usual baseline, she was having 2 or 3 headaches a week, and about 2 severe headaches a month.  The patient may have up to 12 headache days a month.  She is to go to have cataract surgery in the near future.  She remains on Topamax taking 100 mg twice daily.  The patient is tolerating this medication well.  She also is on metoprolol, she took gabapentin previously but could not tolerate the drug.  She returns for an evaluation.  Past Medical History:  Diagnosis Date  . Anxiety   . Breast cancer ([place])    ER+/PR+/Her2-  . Concussion 05/26/14  . Depression   . Frequent UTI   . GERD (gastroesophageal reflux disease)   . Hypothyroid   . Microscopic hematuria   . Migraine   . Radiation 03/22/14-04/19/14   Breast Cancer upper-outer  . Restless leg syndrome   . Seasonal allergies   . Stented coronary artery    LAD 95% 2007.  . Vitamin D deficiency     Past Surgical History:  Procedure Laterality Date  . ABDOMINAL HYSTERECTOMY    . APPENDECTOMY    . back surgery  1997   herniated disc repair  . BREAST LUMPECTOMY Right    2016  . CARDIAC CATHETERIZATION  2007   stent  . CHOLECYSTECTOMY OPEN    . CYSTOSCOPY     neg  . RADIOACTIVE SEED GUIDED PARTIAL MASTECTOMY WITH AXILLARY SENTINEL LYMPH NODE BIOPSY Right 02/14/2014   Procedure: RADIOACTIVE SEED GUIDED PARTIAL MASTECTOMY WITH AXILLARY SENTINEL LYMPH NODE BIOPSY;  Surgeon: Erroll Luna, MD;  Location: [facility];  Service: General;  Laterality: Right;  .  SHOULDER SURGERY      Family History  Problem Relation Age of Onset  . Coronary artery disease Mother   . Thyroid disease Mother   . Alzheimer's disease Mother   . Diabetes Mother   . Heart disease Mother   . Cancer Brother        melnoma  . Ovarian cancer Paternal Aunt 9  . Bone cancer Paternal Uncle   . Alzheimer's disease Maternal Grandmother   . Heart disease Maternal Grandfather   . Pancreatic cancer Maternal Grandfather 71  . Ovarian cancer Maternal Aunt        dx in 107s  . Throat cancer Maternal Uncle        smoker  . Lung cancer Maternal Uncle        heavy smoker  . Ovarian cancer Paternal Aunt        dx in her 69s  . Cancer Cousin        "female cancer"  . Cancer Father     Social history:  reports that she has never smoked. She has never used smokeless tobacco. She reports that she does not drink alcohol or use drugs.    Allergies  Allergen Reactions  . Iohexol      Desc: throat selling resp distress hives.premedicate pt.entered 07/06/04, bsw.     Medications:  Prior  to Admission medications   Medication Sig Start Date End Date Taking? Authorizing Provider  aspirin 81 MG tablet Take 81 mg by mouth daily.   Yes [provider]  cephALEXin (KEFLEX) 250 MG capsule Take 250 mg by mouth at bedtime.  08/11/15  Yes [provider]  CINNAMON PO Take 1,000 mg by mouth daily.    Yes [provider]  Coenzyme Q10 (CO Q-10) 200 MG CAPS Take 1 capsule by mouth daily.     Yes [provider]  fish oil-omega-3 fatty acids 1000 MG capsule Take 1.2 g by mouth 2 (two) times daily.   Yes [provider]  furosemide (LASIX) 20 MG tablet TAKE 1 TABLET (20 MG TOTAL) BY MOUTH DAILY. 06/24/16  Yes Tower, Wynelle Fanny, MD  Ginkgo Biloba 120 MG CAPS Take 1 capsule by mouth daily.     Yes [provider]  levothyroxine (SYNTHROID, LEVOTHROID) 137 MCG tablet Take 137 mcg by mouth daily before breakfast.  03/29/17  Yes [provider]  Magnesium 250 MG TABS Take 250 mg by mouth daily.   Yes [provider]  Multiple Vitamin (MULTIVITAMIN) capsule Take 1 capsule by mouth daily.   Yes [provider]  nitroGLYCERIN (NITROSTAT) 0.4 MG SL tablet Place 1 tablet (0.4 mg total) under the tongue every 5 (five) minutes as needed. Pain 03/28/14  Yes Minus Breeding, MD  pantoprazole (PROTONIX) 40 MG tablet TAKE 1 TABLET (40 MG TOTAL) BY MOUTH 2 (TWO) TIMES DAILY. 06/26/17  Yes Tower, Marne A, MD  PAZEO 0.7 % SOLN Apply 1 drop to eye daily. 1 drop each eye daily. 11/08/14  Yes [provider]  potassium chloride SA (KLOR-CON M20) 20 MEQ tablet TAKE TWO TABLETS BY MOUTH TWICE DAILY AND ONE-HALF AT BEDTIME 06/26/17  Yes Tower, Wynelle Fanny, MD  propranolol ER (INDERAL LA) 60 MG 24 hr capsule TAKE 1 CAPSULE BY MOUTH ONCE DAILY AT BEDTIME 03/31/17  Yes Hochrein, Jeneen Rinks, MD  ranitidine (ZANTAC) 300 MG tablet Take 1 tablet (300 mg total) by mouth 2 (two) times daily. 06/26/17  Yes Tower, Wynelle Fanny, MD  Riboflavin 400 MG TABS Take 400 mg by mouth daily.   Yes [provider]  simvastatin (ZOCOR) 40 MG tablet Take 1 tablet (40 mg total) by mouth at bedtime. 06/30/17  Yes Minus Breeding, MD  topiramate (TOPAMAX) 100 MG tablet Take 1 tablet by mouth 2 (two) times daily. 04/16/17  Yes [provider]  XIIDRA 5 % SOLN Apply 1 drop to eye 2 (two) times daily. 1 drop in each eye Twice daily 01/28/17  Yes [provider]  Erenumab-aooe (AIMOVIG) 140 MG/ML SOAJ Inject 140 mg into the skin every 30 (thirty) days. 07/06/17   Kathrynn Ducking, MD  metoprolol tartrate (LOPRESSOR) 25 MG tablet Take 0.5 tablets (12.5 mg total) by mouth every 4 (four) hours as needed (As needed for palpitations). 12/16/16 03/16/17  Minus Breeding, MD    ROS:  Out of a complete 14 system review of symptoms, the patient complains only of the following symptoms, and all other reviewed systems are negative.  Hearing loss Eye redness, light  sensitivity, loss of vision, blurred vision Palpitations of the heart Daytime sleepiness, snoring Joint pain, back pain, aching muscles, muscle cramps Memory loss, dizziness, headache, numbness, weakness, tremors  Blood pressure 110/65, pulse (!) 57, height _0  (1.727 m), weight 205 lb 8 oz (93.2 kg).  Physical Exam  General: The patient is alert and cooperative at  the time of the examination.  The patient is moderately obese.  Skin: No significant peripheral edema is noted.   Neurologic Exam  Mental status: The patient is alert and oriented x 3 at the time of the examination. The patient has apparent normal recent and remote memory, with an apparently normal attention span and concentration ability.   Cranial nerves: Facial symmetry is present. Speech is normal, no aphasia or dysarthria is noted. Extraocular movements are full. Visual fields are full.  Motor: The patient has good strength in all 4 extremities.  Sensory examination: Soft touch sensation is symmetric on the face, arms, and legs.  Coordination: The patient has good finger-nose-finger and heel-to-shin bilaterally.  Gait and station: The patient has a normal gait. Tandem gait is normal. Romberg is negative. No drift is seen.  Reflexes: Deep tendon reflexes are symmetric.   Assessment/Plan:  1.  Intractable migraine headache  The patient is to remain on Topamax, we will add Aimovig to the current regimen, if the patient does well over the next 3 to 4 months, we will consider coming off of the Topamax in the future.  The patient will follow-up otherwise in about 6 months.  Her most recent hemoglobin A1c was 5.7.  Jill Alexanders MD 07/06/2017 4:30 PM  [facility] [address] [place], [place] [address]  Phone [phone] Fax [phone]

## 2017-07-06 NOTE — Patient Instructions (Signed)
We will try Aimovig for the headache. 

## 2017-07-08 ENCOUNTER — Telehealth: Payer: Self-pay | Admitting: *Deleted

## 2017-07-08 NOTE — Telephone Encounter (Signed)
Completed PA Aimovig 140mg  (1pen/month) on covermymeds. Key: [id number].  It was approved through 10/08/2017 under Medicare Part D. Reference #: [id number].  Faxed notice of approval to [place] at [phone]. Received fax confirmation.   Dx: Chronic migraines ICD10-G43.719.  Tried/failed: Depakote, topamax, metoprolol, propranolol, Effexor, nortriptyline, gabapentin, botox, maxalt, imitrex. Riboflavin, magnesium.

## 2017-07-24 ENCOUNTER — Ambulatory Visit: Payer: Medicare Other

## 2017-08-10 DIAGNOSIS — H2511 Age-related nuclear cataract, right eye: Secondary | ICD-10-CM | POA: Diagnosis not present

## 2017-08-11 DIAGNOSIS — H2512 Age-related nuclear cataract, left eye: Secondary | ICD-10-CM | POA: Diagnosis not present

## 2017-08-17 ENCOUNTER — Encounter: Payer: Self-pay | Admitting: Endocrinology

## 2017-08-18 ENCOUNTER — Inpatient Hospital Stay: Payer: Medicare Other | Attending: Nurse Practitioner | Admitting: Nurse Practitioner

## 2017-08-18 ENCOUNTER — Inpatient Hospital Stay: Payer: Medicare Other

## 2017-08-18 ENCOUNTER — Telehealth: Payer: Self-pay | Admitting: Hematology

## 2017-08-18 ENCOUNTER — Encounter: Payer: Self-pay | Admitting: Nurse Practitioner

## 2017-08-18 VITALS — BP 116/67 | HR 57 | Temp 97.8°F | Resp 17 | Ht 68.0 in | Wt 206.4 lb

## 2017-08-18 DIAGNOSIS — Z923 Personal history of irradiation: Secondary | ICD-10-CM | POA: Diagnosis not present

## 2017-08-18 DIAGNOSIS — C50411 Malignant neoplasm of upper-outer quadrant of right female breast: Secondary | ICD-10-CM | POA: Insufficient documentation

## 2017-08-18 DIAGNOSIS — Z79899 Other long term (current) drug therapy: Secondary | ICD-10-CM | POA: Insufficient documentation

## 2017-08-18 DIAGNOSIS — E559 Vitamin D deficiency, unspecified: Secondary | ICD-10-CM | POA: Diagnosis not present

## 2017-08-18 DIAGNOSIS — Z170 Estrogen receptor positive status [ER+]: Secondary | ICD-10-CM | POA: Insufficient documentation

## 2017-08-18 DIAGNOSIS — M8580 Other specified disorders of bone density and structure, unspecified site: Secondary | ICD-10-CM | POA: Diagnosis not present

## 2017-08-18 DIAGNOSIS — E039 Hypothyroidism, unspecified: Secondary | ICD-10-CM | POA: Diagnosis not present

## 2017-08-18 LAB — CBC WITH DIFFERENTIAL/PLATELET
Basophils Absolute: 0 10*3/uL (ref 0.0–0.1)
Basophils Relative: 1 %
EOS PCT: 3 %
Eosinophils Absolute: 0.1 10*3/uL (ref 0.0–0.5)
HEMATOCRIT: 38.1 % (ref 34.8–46.6)
Hemoglobin: 13 g/dL (ref 11.6–15.9)
Lymphocytes Relative: 30 %
Lymphs Abs: 1.5 10*3/uL (ref 0.9–3.3)
MCH: 30.6 pg (ref 25.1–34.0)
MCHC: 34.1 g/dL (ref 31.5–36.0)
MCV: 89.6 fL (ref 79.5–101.0)
MONO ABS: 0.3 10*3/uL (ref 0.1–0.9)
MONOS PCT: 7 %
NEUTROS ABS: 3 10*3/uL (ref 1.5–6.5)
Neutrophils Relative %: 59 %
Platelets: 155 10*3/uL (ref 145–400)
RBC: 4.25 MIL/uL (ref 3.70–5.45)
RDW: 13.8 % (ref 11.2–14.5)
WBC: 5.1 10*3/uL (ref 3.9–10.3)

## 2017-08-18 LAB — COMPREHENSIVE METABOLIC PANEL
ALBUMIN: 4.2 g/dL (ref 3.5–5.0)
ALK PHOS: 68 U/L (ref 38–126)
ALT: 22 U/L (ref 0–44)
ANION GAP: 8 (ref 5–15)
AST: 20 U/L (ref 15–41)
BUN: 15 mg/dL (ref 8–23)
CALCIUM: 10 mg/dL (ref 8.9–10.3)
CO2: 22 mmol/L (ref 22–32)
Chloride: 113 mmol/L — ABNORMAL HIGH (ref 98–111)
Creatinine, Ser: 1.34 mg/dL — ABNORMAL HIGH (ref 0.44–1.00)
GFR calc Af Amer: 48 mL/min — ABNORMAL LOW (ref >60)
GFR calc non Af Amer: 41 mL/min — ABNORMAL LOW (ref >60)
GLUCOSE: 105 mg/dL — AB (ref 70–99)
Potassium: 4.4 mmol/L (ref 3.5–5.1)
SODIUM: 143 mmol/L (ref 135–145)
Total Bilirubin: 1.1 mg/dL (ref 0.3–1.2)
Total Protein: 7.3 g/dL (ref 6.5–8.1)

## 2017-08-18 NOTE — Progress Notes (Signed)
[place]  [phone] [phone]  Clinic Follow up Note   Patient Care Team: Tower, Wynelle Fanny, MD as PCP - General Minus Breeding, MD as Consulting Physician (Cardiology) Holley Bouche, NP as Nurse Practitioner (Nurse Practitioner) Erroll Luna, MD as Consulting Physician (General Surgery) Thea Silversmith, MD as Consulting Physician (Radiation Oncology) Truitt Merle, MD as Consulting Physician (Hematology) Susa Day, MD as Consulting Physician (Orthopedic Surgery) 08/18/2017  SUMMARY OF ONCOLOGIC HISTORY:   Breast cancer of upper-outer quadrant of right female breast ([place])   12/29/2013 Imaging    Screening mammogram and [place]: an irregular shadowing hypoechoic mass right breast 9 o'clock location 4 cm from the nipple measuring 7 x 7 x 7 mm. No adenopathy       12/29/2013 Initial Diagnosis    Breast cancer of upper-outer quadrant of right female breast      12/29/2013 Initial Biopsy    Grade I-II IDC, ER+ (98%), PR+ (81%), HER2- (ratio 1.24), Ki67 15%       01/11/2014 Procedure    Genetic counseling/testing: Revealed 1 VUS in ATM gene, p.D1641H (c.4921G>C).  Otherwise, genetic testing negative.       02/14/2014 Surgery    Right lumpectomy with SLNB ([facility]): Grade 2 IDC spanning 0.9 cm with associated grade 2 DCIS.  Negative margins. 5 sentinel lymph nodes negative.  HER2 repeated and neg. (ratio 1.16).      02/14/2014 Pathologic Stage    pT1bpN0; Stage IA      02/14/2014 Oncotype testing    Recurrence score 21 (13% risk of distant recurrence); no adjuvant chemo offered.       03/22/2014 - 04/19/2014 Radiation Therapy    Adjuvant radiation Pablo Ledger); Right breast: Total dose 42.72 Gy over 21 fractions. Right breast boost: Total dose 10 Gy over 5 fractions.        Anti-estrogen oral therapy    Anti-estrogen therapy was recommended by Dr. Burr Medico; pt declines anti-estrogen treatment.        05/25/2014 Survivorship   Survivorship Care Plan given to patient and reviewed with her in-person.       05/30/2016 Imaging    US Abdomen 05/30/16 IMPRESSION: Status post cholecystectomy. Pancreas not visualized due to overlying bowel gas. Increased echogenicity of hepatic parenchyma is noted diffusely suggesting fatty infiltration or other diffuse hepatocellular disease.      07/02/2016 Imaging    DEXA 07/02/16 T score of -1.8, indicating osteopenia      12/15/2016 Mammogram    IMPRESSION: 1. No mammographic evidence of malignancy in either breast. 2. Stable right breast post lumpectomy changes.     CURRENT THERAPY: surveillance   INTERVAL HISTORY: Ms. Raymond returns for f/u as scheduled for breast cancer surveillance. She was last seen by Dr. Burr Medico in 01/2017. She recently underwent cataract surgery, which was accelerated by tick-borne illness. She does not f/u with ID anymore, but continues f/u with cardiology, neurology, and PCP. She is fatigued, intermittently nauseous, and has muscle cramps from "head to toe" including her left breast, all of which she attributes to tick illness. She is not interested in medication unless she absolutely needs it. She has intermittent constipation related to new migraine medication, takes miralax when necessary. She notes she has shallow breathing at time which she is trying to be more aware of, denies cough, chest pain, or dyspnea. No recent fever, chills, new mall/lump in her breast, or other concerns.   REVIEW OF SYSTEMS:   Constitutional: Denies fevers, chills or abnormal weight  loss (+) fatigue (+) appetite fluctuates  Eyes: Denies blurriness of vision (+) s/p cataract surgery  Ears, nose, mouth, throat, and face: Denies mucositis or sore throat Respiratory: Denies cough, dyspnea or wheezes (+) shallow breathing  Cardiovascular: Denies palpitation, chest discomfort or lower extremity swelling Gastrointestinal:  Denies vomiting, diarrhea, heartburn or change in bowel habits (+)  intermittent nausea (+) constipation Skin: Denies abnormal skin rashes Lymphatics: Denies new lymphadenopathy or easy bruising Neurological:Denies numbness, tingling or new weaknesses (+) migraines, on medication  Behavioral/Psych: Mood is stable, no new changes  MSK: (+) muscle cramps  Breast: Denies new mass, nipple discharge, or inversion  All other systems were reviewed with the patient and are negative.  MEDICAL HISTORY:  Past Medical History:  Diagnosis Date  . Anxiety   . Breast cancer ([place])    ER+/PR+/Her2-  . Concussion 05/26/14  . Depression   . Frequent UTI   . GERD (gastroesophageal reflux disease)   . Hypothyroid   . Microscopic hematuria   . Migraine   . Radiation 03/22/14-04/19/14   Breast Cancer upper-outer  . Restless leg syndrome   . Seasonal allergies   . Stented coronary artery    LAD 95% 2007.  . Vitamin D deficiency     SURGICAL HISTORY: Past Surgical History:  Procedure Laterality Date  . ABDOMINAL HYSTERECTOMY    . APPENDECTOMY    . back surgery  1997   herniated disc repair  . BREAST LUMPECTOMY Right    2016  . CARDIAC CATHETERIZATION  2007   stent  . CHOLECYSTECTOMY OPEN    . CYSTOSCOPY     neg  . RADIOACTIVE SEED GUIDED PARTIAL MASTECTOMY WITH AXILLARY SENTINEL LYMPH NODE BIOPSY Right 02/14/2014   Procedure: RADIOACTIVE SEED GUIDED PARTIAL MASTECTOMY WITH AXILLARY SENTINEL LYMPH NODE BIOPSY;  Surgeon: Erroll Luna, MD;  Location: [place];  Service: General;  Laterality: Right;  . SHOULDER SURGERY      I have reviewed the social history and family history with the patient and they are unchanged from previous note.  ALLERGIES:  is allergic to iohexol.  MEDICATIONS:  Current Outpatient Medications  Medication Sig Dispense Refill  . aspirin 81 MG tablet Take 81 mg by mouth daily.    . cephALEXin (KEFLEX) 250 MG capsule Take 250 mg by mouth at bedtime.     Marland Kitchen CINNAMON PO Take 1,000 mg by mouth daily.     . Coenzyme Q10  (CO Q-10) 200 MG CAPS Take 1 capsule by mouth daily.      . DUREZOL 0.05 % EMUL INSTILL 1 DROP INTO RIGHT EYE THREE TIMES DAILY AS DIRECTED  1  . Erenumab-aooe (AIMOVIG) 140 MG/ML SOAJ Inject 140 mg into the skin every 30 (thirty) days. 1 pen 4  . fish oil-omega-3 fatty acids 1000 MG capsule Take 1.2 g by mouth 2 (two) times daily.    . furosemide (LASIX) 20 MG tablet TAKE 1 TABLET (20 MG TOTAL) BY MOUTH DAILY. 90 tablet 3  . Ginkgo Biloba 120 MG CAPS Take 1 capsule by mouth daily.      Marland Kitchen ketorolac (ACULAR) 0.5 % ophthalmic solution     . levothyroxine (SYNTHROID, LEVOTHROID) 137 MCG tablet Take 137 mcg by mouth daily before breakfast.     . Magnesium 250 MG TABS Take 250 mg by mouth daily.    Marland Kitchen moxifloxacin (VIGAMOX) 0.5 % ophthalmic solution INSTILL 1 DROP INTO LEFT EYE 4 TIMES DAILY AS DIRECTED  1  . Multiple Vitamin (MULTIVITAMIN)  capsule Take 1 capsule by mouth daily.    . nitroGLYCERIN (NITROSTAT) 0.4 MG SL tablet Place 1 tablet (0.4 mg total) under the tongue every 5 (five) minutes as needed. Pain 25 tablet 3  . pantoprazole (PROTONIX) 40 MG tablet TAKE 1 TABLET (40 MG TOTAL) BY MOUTH 2 (TWO) TIMES DAILY. 180 tablet 3  . PAZEO 0.7 % SOLN Apply 1 drop to eye daily. 1 drop each eye daily.    . potassium chloride SA (KLOR-CON M20) 20 MEQ tablet TAKE TWO TABLETS BY MOUTH TWICE DAILY AND ONE-HALF AT BEDTIME 405 tablet 3  . propranolol ER (INDERAL LA) 60 MG 24 hr capsule TAKE 1 CAPSULE BY MOUTH ONCE DAILY AT BEDTIME 90 capsule 0  . ranitidine (ZANTAC) 300 MG tablet Take 1 tablet (300 mg total) by mouth 2 (two) times daily. 180 tablet 3  . Riboflavin 400 MG TABS Take 400 mg by mouth daily.    . simvastatin (ZOCOR) 40 MG tablet Take 1 tablet (40 mg total) by mouth at bedtime. 90 tablet 3  . topiramate (TOPAMAX) 100 MG tablet Take 1 tablet by mouth 2 (two) times daily.  11  . XIIDRA 5 % SOLN Apply 1 drop to eye 2 (two) times daily. 1 drop in each eye Twice daily    . metoprolol tartrate  (LOPRESSOR) 25 MG tablet Take 0.5 tablets (12.5 mg total) by mouth every 4 (four) hours as needed (As needed for palpitations). 30 tablet 5   No current facility-administered medications for this visit.     PHYSICAL EXAMINATION: ECOG PERFORMANCE STATUS: 0 - Asymptomatic  Vitals:   08/18/17 1502  BP: 116/67  Pulse: (!) 57  Resp: 17  Temp: 97.8 F (36.6 C)  SpO2: 100%   Filed Weights   08/18/17 1502  Weight: 206 lb 6.4 oz (93.6 kg)    GENERAL:alert, no distress and comfortable SKIN: skin is pale, no rashes or significant lesions EYES: normal, Conjunctiva are pink and non-injected, sclera clear OROPHARYNX:no thrush or ulcers  LYMPH:  no palpable cervical, supraclavicular, or axillary lymphadenopathy LUNGS: clear to auscultation with normal breathing effort HEART: regular rate & rhythm and no murmurs and no lower extremity edema ABDOMEN:abdomen soft, non-tender and normal bowel sounds Musculoskeletal:no cyanosis of digits and no clubbing  NEURO: alert & oriented x 3 with fluent speech, no focal motor/sensory deficits BREAST exam shows them to be symmetrical without nipple discharge or inversion. Right breast s/p lumpectomy, incisions are well healed. No palpable mass in either breast or axilla that I could appreciate.    LABORATORY DATA:  I have reviewed the data as listed CBC Latest Ref Rng & Units 08/18/2017 06/26/2017 02/16/2017  WBC 3.9 - 10.3 K/uL 5.1 6.4 6.9  Hemoglobin 11.6 - 15.9 g/dL 13.0 13.3 13.6  Hematocrit 34.8 - 46.6 % 38.1 38.5 39.8  Platelets 145 - 400 K/uL 155 186 182     CMP Latest Ref Rng & Units 08/18/2017 06/26/2017 02/16/2017  Glucose 70 - 99 mg/dL 105(H) 107(H) 115  BUN 8 - 23 mg/dL '15 19 15  ' Creatinine 0.44 - 1.00 mg/dL 1.34(H) 1.20(H) 1.39(H)  Sodium 135 - 145 mmol/L 143 139 142  Potassium 3.5 - 5.1 mmol/L 4.4 4.4 4.1  Chloride 98 - 111 mmol/L 113(H) 108 111(H)  CO2 22 - 32 mmol/L '22 23 22  ' Calcium 8.9 - 10.3 mg/dL 10.0 9.9 9.8  Total Protein 6.5 -  8.1 g/dL 7.3 6.8 7.7  Total Bilirubin 0.3 - 1.2 mg/dL 1.1 1.2 1.2  Alkaline Phos 38 - 126 U/L 68 - 63  AST 15 - 41 U/L '20 22 22  ' ALT 0 - 44 U/L '22 25 22   ' PATHOLOGY REPORT 12/30/2013 Diagnosis 1. Breast, lumpectomy, Right 02/14/2014 - INVASIVE GRADE II DUCTAL CARCINOMA SPANNING 0.9 CM IN GREATEST DIMENSION. - ASSOCIATED INTERMEDIATE GRADE DUCTAL CARCINOMA IN SITU. - MARGINS ARE NEGATIVE. - SEE ONCOLOGY TEMPLATE. 2. Breast, excision, Right - BENIGN BREAST PARENCHYMA WITH SCATTERED ACUTE AND CHRONIC INFLAMMATION. - NO ATYPIA, HYPERPLASIA, OR MALIGNANCY IDENTIFIED. 3. Lymph node, sentinel, biopsy, Right axillary - ONE BENIGN LYMPH NODE WITH NO TUMOR SEEN (0/1). 4. Lymph node, sentinel, biopsy, Right axillary - ONE BENIGN LYMPH NODE WITH NO TUMOR SEEN (0/1). 5. Lymph node, sentinel, biopsy, Right axillary - ONE BENIGN LYMPH NODE WITH NO TUMOR SEEN (0/1). 6. Lymph node, sentinel, biopsy, Right axillary - ONE BENIGN LYMPH NODE WITH NO TUMOR SEEN (0/1). 7. Lymph node, sentinel, biopsy, Right axillary - ONE BENIGN LYMPH NODE WITH NO TUMOR SEEN (0/1). 1. BREAST, INVASIVE TUMOR, WITH LYMPH NODES PRESENT Specimen, including laterality and lymph node sampling (sentinel, non-sentinel): Right partial breast with right axillary sentinel lymph node sampling. Procedure: Right breast lumpectomy with right axillary sentinel lymph node mapping and biopsy. Histologic type: Invasive ductal carcinoma. Grade: II. Tubule formation: 3. Nuclear pleomorphism: 2. Mitotic:1. Tumor size (gross measurement and microscopic measurement): 0.9 cm. 1 of 4 FINAL for GEORGETTE, HELMER ([id number]) Microscopic Comment(continued) Margins: Invasive, distance to closest margin: 0.4 cm (medial margin). In-situ, distance to closest margin: At least 0.4 cm (medial margin). Lymphovascular invasion: Definitive lymph/vascular invasion is not identified. Ductal carcinoma in situ: Yes, present. Grade: Intermediate  grade. Extensive intraductal component: No. Lobular neoplasia: Not identified. Tumor focality: Unifocal. Treatment effect: Not applicable. Extent of tumor: Tumor confined to breast parenchyma. Skin: Not received. Nipple: Not received. Skeletal muscle: Not received. Lymph nodes: Examined: 5 Sentinel. 5 Non-sentinel. 5 Total. Lymph nodes with metastasis: 0. Isolated tumor cells (< 0.2 mm): 0. Micrometastasis: (> 0.2 mm and < 2.0 mm): 0. Macrometastasis: (> 2.0 mm): 0. Extracapsular extension: Not applicable. Breast prognostic profile: Performed on previous biopsy ([id number]) Estrogen receptor: 98%, positive. Progesterone receptor: 81%, positive. Her 2 neu by [facility]: 1.24 ratio, not amplified. Ki-67: 15% Non-neoplastic breast: Fat necrosis. TNM: pT1b, pN0.  ONCOTYPE DX RS score: 21    RADIOGRAPHIC STUDIES: I have personally reviewed the radiological images as listed and agreed with the findings in the report. No results found.   ASSESSMENT & PLAN: 62 y.o. postmenopausal woman, with past medical history of coronary disease, anxiety and depression, who was found by screening mammogram to have a T1b N0 M0 stage I a invasive ductal carcinoma, grade I-II, ER/PR strongly positive, HER-2 negative, Ki-67 10%.  1. pT1b N0 M0 stage IA invasive ductal carcinoma of right breast, grade II, ER/PR strongly positive, HER-2 negative, Oncotype RS 21; patient declined adjuvant AI 2. Osteopenia, DEXA 06/2016 without high risk for fracture, on calcium and vitamin D 3. Genetics - negative BRCA and testing from [facility] [facility] on 01/25/14; BRIP1 c.4921G>C VUS found on the CancerNext panel.   4. Right upper arm pain 5. Mild hyperbilirubinemia 6. Body pain   Ms. Loberg is doing well from a breast cancer standpoint. Her breast exam is unremarkable. Labs are stable. Bilirubin is normal. Vitamin D is pending. No clinical concern for recurrence. She declined adjuvant AI in the past. Will continue  breast cancer surveillance. She is due annual mammogram in 11/2017, I ordered today.  Plan to  repeat DEXA in 2020, she continues calcium and vitamin D for osteopenia. She continues f/u with PCP, cardiology, and neurology for conditions related to tick-borne illness. Return for lab, f/u with Dr. Burr Medico in 6 months.   PLAN -Labs reviewed -Continue breast cancer surveillance -Annaual mammogram due 11/2017  -Lab, f/u with Dr. Burr Medico in 6 months  -F/u vitamin D  Orders Placed This Encounter  Procedures  . MM DIAG BREAST TOMO BILATERAL    Standing Status:   Future    Standing Expiration Date:   08/19/2018    Order Specific Question:   Reason for Exam (SYMPTOM  OR DIAGNOSIS REQUIRED)    Answer:   h/o right breast cancer s/p lumpectomy and adjuvant radiation    Order Specific Question:   Preferred imaging location?    Answer:   [facility]   All questions were answered. The patient knows to call the clinic with any problems, questions or concerns. No barriers to learning was detected.     Alla Feeling, NP 08/18/17

## 2017-08-18 NOTE — Telephone Encounter (Signed)
Gave patient avs and calendar of upcoming jan appts. °

## 2017-08-19 LAB — VITAMIN D 25 HYDROXY (VIT D DEFICIENCY, FRACTURES): Vit D, 25-Hydroxy: 42.1 ng/mL (ref 30.0–100.0)

## 2017-08-20 ENCOUNTER — Telehealth: Payer: Self-pay | Admitting: *Deleted

## 2017-08-20 NOTE — Telephone Encounter (Signed)
-----   Message from Truitt Merle, MD sent at 08/20/2017  9:24 AM EDT ----- [name] [name], please let pt know her vitD was normal 2 days ago, continue current VitD supplement, thanks  Truitt Merle  08/20/2017

## 2017-08-20 NOTE — Telephone Encounter (Signed)
Called lm for patient to advise of Vit D Levels being normal and to continue current Vit D supplement.  Number provided if she should have any questions.

## 2017-08-31 DIAGNOSIS — H2512 Age-related nuclear cataract, left eye: Secondary | ICD-10-CM | POA: Diagnosis not present

## 2017-09-01 DIAGNOSIS — H2512 Age-related nuclear cataract, left eye: Secondary | ICD-10-CM | POA: Diagnosis not present

## 2017-09-30 ENCOUNTER — Telehealth: Payer: Self-pay | Admitting: *Deleted

## 2017-09-30 NOTE — Telephone Encounter (Signed)
Spoke to patient - reports a reduction in frequency and intensity of migraines since starting Aimovig.  She estimates having 4 migraine days per month now.  PA approved for Aimovig through [employer] [phone]).  Case completed through covermymeds (OPF:[id number]).  Pt MN#[id number].  XY#[id number].  Approved valid through 01/26/2018.

## 2017-10-09 ENCOUNTER — Other Ambulatory Visit: Payer: Self-pay | Admitting: Adult Health

## 2017-10-12 ENCOUNTER — Telehealth: Payer: Self-pay | Admitting: *Deleted

## 2017-10-12 MED ORDER — TOPIRAMATE 100 MG PO TABS: 100.0000 mg | ORAL_TABLET | Freq: Two times a day (BID) | ORAL | 5 refills | Status: DC

## 2017-10-12 NOTE — Telephone Encounter (Signed)
Received fax from [facility] requesting refill of topiramate. Refilled x 6 months. Patient has FU in Jan 2020.

## 2017-10-27 DIAGNOSIS — Z853 Personal history of malignant neoplasm of breast: Secondary | ICD-10-CM | POA: Diagnosis not present

## 2017-11-07 ENCOUNTER — Encounter: Payer: Self-pay | Admitting: Family Medicine

## 2017-11-10 MED ORDER — FUROSEMIDE 20 MG PO TABS: ORAL_TABLET | ORAL | 3 refills | Status: DC

## 2017-11-10 NOTE — Telephone Encounter (Signed)
Lasix refill

## 2017-11-16 ENCOUNTER — Ambulatory Visit (INDEPENDENT_AMBULATORY_CARE_PROVIDER_SITE_OTHER): Payer: Medicare Other | Admitting: Psychiatry

## 2017-11-16 DIAGNOSIS — F411 Generalized anxiety disorder: Secondary | ICD-10-CM

## 2017-11-16 DIAGNOSIS — F331 Major depressive disorder, recurrent, moderate: Secondary | ICD-10-CM | POA: Diagnosis not present

## 2017-11-16 NOTE — Progress Notes (Signed)
      [facility] Counselor/Therapist Progress Note   Patient ID: [name] [name], MRN: [id number]  Date: 11/16/2017  Timespent: 50 minutes 4:30p-5:20p  Treatment Type: Individual Therapy  Subjective:  Sometimes worries whether she did enough for mother when in decline.  Feeling less hopeful about life in general.  Worries her adult, psyciatrically disabled son is only living for her, would suicide if she were gone.  Alarmed by neighbors' behavior, incl. a house designated a church that typically has around 10 people there, too small to live together, has heard gunsnhots, have kids running the road middle of the night.  Variable sleep pattern, often up late on computer.  Was supposed to get progressive lenses s/p lens surgery, had difficulties with optical care.  Alarmed about Aimovig migraine injxs.  Feels like her brain has swollen, and has some episodes where she feels she may conk out, starting about 1 month after beginning.  Migraines less, but having BP elevations and catching her breath, increased HR when she does.  Migraines trying to return.  Interventions:Solution Focused and Supportive  Examined attributions about son, possible that he is as described, but better to know if not.  OK to want him to have interests outside the virtual world contained in his room (computer, movies, music).  Offered possibility that she has been having panic in response to odd physical senations.  Option to try Aimovig again -- see physician.  Option to call law enforcement for neighborhood disturbance.  Support provided.  Try to regulate sleep cycle further.  Mental Status Exam:   Appearance:   Casual     Behavior:  Appropriate  Motor:  Normal  Speech/Language:   Clear and Coherent  Affect:  Appropriate  Mood:  anxious and dysthymic  Thought process:  Relevant  Thought content:    Negative  Perceptual disturbances:    Normal  Orientation:  Full (Time, Place, and Person)  Attention:  Good   Concentration:  good  Memory:  intact  Fund of knowledge:   Good  Insight:    Good  Judgment:   Fair  Impulse Control:  good    Risk Assessment: Danger to Self:  No Self-injurious Behavior: No Danger to Others: No Duty to Warn:no Physical Aggression / Violence:No  Access to Firearms a concern: No  Gang Involvement:No   Diagnosis:   ICD-10-CM   1. Major depressive disorder, recurrent episode, moderate (HCC) F33.1   2. Generalized anxiety disorder F41.1    Plan:  Try to regulate sleep cycle further Option to talk with son more intimately about his emotional life, coping, outlook rather than worry silently F/u with physician about SE of migraine treatment Remains disabled from any occupation due to symptom severity. . Continue to utilize previously learned skills ad lib . Maintain medication, if prescribed, and work faithfully with relevant prescriber(s) . Call the clinic on-call service, present to ER, or call 911 if any life-threatening emergency . Follow up with me at discretion   Blanchie Serve, PhD

## 2017-12-16 ENCOUNTER — Ambulatory Visit
Admission: RE | Admit: 2017-12-16 | Discharge: 2017-12-16 | Disposition: A | Discharge status: Home / Self Care | Payer: Medicare Other | Source: Ambulatory Visit | Attending: Nurse Practitioner | Admitting: Nurse Practitioner

## 2017-12-16 DIAGNOSIS — Z170 Estrogen receptor positive status [ER+]: Principal | ICD-10-CM

## 2017-12-16 DIAGNOSIS — Z853 Personal history of malignant neoplasm of breast: Secondary | ICD-10-CM | POA: Diagnosis not present

## 2017-12-16 DIAGNOSIS — R928 Other abnormal and inconclusive findings on diagnostic imaging of breast: Secondary | ICD-10-CM | POA: Diagnosis not present

## 2017-12-16 DIAGNOSIS — C50411 Malignant neoplasm of upper-outer quadrant of right female breast: Secondary | ICD-10-CM

## 2017-12-28 ENCOUNTER — Ambulatory Visit (INDEPENDENT_AMBULATORY_CARE_PROVIDER_SITE_OTHER): Payer: Medicare Other | Admitting: Psychiatry

## 2017-12-28 ENCOUNTER — Encounter: Payer: Self-pay | Admitting: Family Medicine

## 2017-12-28 DIAGNOSIS — F341 Dysthymic disorder: Secondary | ICD-10-CM | POA: Diagnosis not present

## 2017-12-28 DIAGNOSIS — Z8659 Personal history of other mental and behavioral disorders: Secondary | ICD-10-CM

## 2017-12-28 DIAGNOSIS — F411 Generalized anxiety disorder: Secondary | ICD-10-CM | POA: Diagnosis not present

## 2017-12-28 DIAGNOSIS — F331 Major depressive disorder, recurrent, moderate: Secondary | ICD-10-CM | POA: Diagnosis not present

## 2017-12-28 NOTE — Progress Notes (Signed)
Psychotherapy Progress Note -- Luan Moore, PhD, [facility]  Patient ID: EMMOGENE SIMSON     MRN: [id number]     Date: 12/28/2017   Treatment Type: Individual psychotherapy Start: 4:10p Stop: 5:00p Time Spent: 50 min Accompanied by: none  Self-Report (interim history, self-report of stressors and symptoms, application of prior therapy, status changes) C/o hearing loss, mainly left ear, been having to ask people to repeat themselves.  C/o PCP brushing off problems, including this, but tested at PCP's office, found to be mild, below need for hearing aid.  Notes breathing problem, feeling of swollen head, both attributed to Aimovig injection, so stopped it.  Learned Zantac has been linked tentatively to cancer, called in, got a noncommittal response, decided to go off it herself.  Has been skeptical of being on both ranitidine and pantoprazole anyway, and got PCP to reduce pantoprazole.  Offered self-care ideas for GERD, including ice water.  Says medical information goes on and off MyChart, seemingly at random.  (Chart check today looks like very little activity on Problem List, primarily 2012 entries from PCP's office with no specifics or updates showing -- unsure what she is referring to.)  Haircut Wednesday went much further than asked, took a good 2 inches when asked 1, embarrassed last 5 days, wearing hat everywhere but here.  Initially apologetic for her appearance, which is mannish but not unsightly.  Challenged about severity of self-judgment and about potential for unknown workings of e-chart to seem like something scurrilous when it is just the "noise" of a complex system, used by multiple people and watched closely by one.  Discussed interactions with son, who remains housebound and disabled with social anxiety and depression.  Therapies used: Assertiveness/Communication, Solution-Oriented/Positive Psychology and Ego-Supportive  Mental Status/Observations:     Appearance:    Casual     Behavior:  Appropriate  Motor:  Normal  Speech/Language:   Clear and Coherent  Affect:  Appropriate  Mood:  frustrated, self-conscious  Thought process:  normal and possibly overvalued ideas of being messed with  Thought content:    WNL  Sensory/Perceptual disturbances:    WNL  Orientation:  WNL  Attention:  Good  Concentration:  Good  Memory:  grossly intact  Fund of knowledge:   Good  Insight:    Fair  Judgment:   Fair  Impulse Control:  Good   Risk Assessment: Danger to Self:  No Self-injurious Behavior: No Danger to Others: No Duty to Warn:no Physical Aggression / Violence:No  Access to Firearms a concern: No   Diagnosis:   ICD-10-CM   1. Early onset dysthymia F34.1   2. History of posttraumatic stress disorder (PTSD) Z86.59   3. Generalized anxiety disorder F41.1   4. Major depressive disorder, recurrent episode, moderate (HCC) F33.1    Progress rating:  stable  Plan:  . Trust hair will grow and chart not being altered . Option to try ice water for GERD management . OK to have hearing tested more professionally if desired . Continue to utilize previously learned skills ad lib . Maintain medication, if prescribed, and work faithfully with relevant prescriber(s) . Call the clinic on-call service, present to ER, or call 911 if any life-threatening emergency . Follow up with me in about a month  Blanchie Serve, PhD

## 2017-12-29 MED ORDER — FAMOTIDINE 40 MG PO TABS: 40.0000 mg | ORAL_TABLET | Freq: Two times a day (BID) | ORAL | 3 refills | Status: DC

## 2017-12-29 NOTE — Telephone Encounter (Signed)
Change zantac to pepcid due to lack of avail

## 2018-01-01 ENCOUNTER — Telehealth: Payer: Self-pay | Admitting: *Deleted

## 2018-01-01 NOTE — Telephone Encounter (Signed)
error 

## 2018-01-21 ENCOUNTER — Telehealth: Payer: Self-pay

## 2018-01-21 NOTE — Telephone Encounter (Signed)
PA approval received for Aimovig 140 mg.  PA ref # [id number]  Approval effective 01/21/2018-01/27/2019.  MB RN

## 2018-01-21 NOTE — Telephone Encounter (Signed)
PA for Aimovig 140 mg/ML submitted to Cover my meds;  Key [id number],    Dx: Chronic migraines ICD10-G43.719.  Tried/failed: Depakote, topamax, metoprolol, propranolol, Effexor, nortriptyline, gabapentin, botox, maxalt, imitrex. Riboflavin, magnesium.  Will follow determination on cover my meds

## 2018-02-03 ENCOUNTER — Encounter: Payer: Self-pay | Admitting: Adult Health

## 2018-02-03 ENCOUNTER — Ambulatory Visit (INDEPENDENT_AMBULATORY_CARE_PROVIDER_SITE_OTHER): Payer: Medicare Other | Admitting: Adult Health

## 2018-02-03 VITALS — BP 120/66 | HR 53 | Ht 68.0 in | Wt 204.6 lb

## 2018-02-03 DIAGNOSIS — G43719 Chronic migraine without aura, intractable, without status migrainosus: Secondary | ICD-10-CM | POA: Diagnosis not present

## 2018-02-03 MED ORDER — FREMANEZUMAB-VFRM 225 MG/1.5ML ~~LOC~~ SOSY: 225.0000 mg | PREFILLED_SYRINGE | SUBCUTANEOUS | 5 refills | Status: DC
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
## 2018-02-03 NOTE — Progress Notes (Signed)
I have read the note, and I agree with the clinical assessment and plan.  Charles K Willis   

## 2018-02-03 NOTE — Patient Instructions (Signed)
Your Plan:  Continue Topamax Stop Aimovig Start Ajovy If your symptoms worsen or you develop new symptoms please let us know.   Thank you for coming to see [place] at [facility]. I hope we have been able to provide you high quality care today.  You may receive a patient satisfaction survey over the next few weeks. We would appreciate your feedback and comments so that we may continue to improve ourselves and the health of our patients.

## 2018-02-04 ENCOUNTER — Telehealth: Payer: Self-pay | Admitting: *Deleted

## 2018-02-04 NOTE — Telephone Encounter (Signed)
Ajovy, Reference Number: [id number]. AJOVY INJ 225/1.5 is approved through 01/27/2019. For further questions, call [phone].

## 2018-02-04 NOTE — Addendum Note (Signed)
Addended by: Minna Antis on: 02/04/2018 07:42 AM   Modules accepted: Orders

## 2018-02-04 NOTE — Telephone Encounter (Signed)
Ajovy PA started on CMM, key [id number]  OPtum Rx Medicare Part D.   Dx: Chronic migraines ICD10-G43.719. Tried/failed: Depakote, topamax, metoprolol, propranolol, Effexor, nortriptyline, gabapentin, botox, maxalt, imitrex. Riboflavin, magnesium, Aimovig.

## 2018-02-08 ENCOUNTER — Ambulatory Visit (INDEPENDENT_AMBULATORY_CARE_PROVIDER_SITE_OTHER): Payer: Medicare Other | Admitting: Psychiatry

## 2018-02-08 DIAGNOSIS — F341 Dysthymic disorder: Secondary | ICD-10-CM | POA: Diagnosis not present

## 2018-02-08 DIAGNOSIS — G894 Chronic pain syndrome: Secondary | ICD-10-CM

## 2018-02-08 DIAGNOSIS — F331 Major depressive disorder, recurrent, moderate: Secondary | ICD-10-CM

## 2018-02-08 DIAGNOSIS — F411 Generalized anxiety disorder: Secondary | ICD-10-CM

## 2018-02-08 DIAGNOSIS — F1011 Alcohol abuse, in remission: Secondary | ICD-10-CM

## 2018-02-08 DIAGNOSIS — Z8659 Personal history of other mental and behavioral disorders: Secondary | ICD-10-CM
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
## 2018-02-15 ENCOUNTER — Inpatient Hospital Stay: Payer: Medicare Other | Attending: Hematology

## 2018-02-15 ENCOUNTER — Inpatient Hospital Stay (HOSPITAL_BASED_OUTPATIENT_CLINIC_OR_DEPARTMENT_OTHER): Payer: Medicare Other | Admitting: Hematology

## 2018-02-15 ENCOUNTER — Telehealth: Payer: Self-pay | Admitting: Hematology

## 2018-02-15 ENCOUNTER — Encounter: Payer: Self-pay | Admitting: Hematology

## 2018-02-15 DIAGNOSIS — E559 Vitamin D deficiency, unspecified: Secondary | ICD-10-CM

## 2018-02-15 DIAGNOSIS — K219 Gastro-esophageal reflux disease without esophagitis: Secondary | ICD-10-CM | POA: Insufficient documentation

## 2018-02-15 DIAGNOSIS — Z9049 Acquired absence of other specified parts of digestive tract: Secondary | ICD-10-CM | POA: Diagnosis not present

## 2018-02-15 DIAGNOSIS — Z7982 Long term (current) use of aspirin: Secondary | ICD-10-CM | POA: Diagnosis not present

## 2018-02-15 DIAGNOSIS — R110 Nausea: Secondary | ICD-10-CM | POA: Insufficient documentation

## 2018-02-15 DIAGNOSIS — R1012 Left upper quadrant pain: Secondary | ICD-10-CM | POA: Insufficient documentation

## 2018-02-15 DIAGNOSIS — C50411 Malignant neoplasm of upper-outer quadrant of right female breast: Secondary | ICD-10-CM

## 2018-02-15 DIAGNOSIS — N183 Chronic kidney disease, stage 3 (moderate): Secondary | ICD-10-CM | POA: Diagnosis not present

## 2018-02-15 DIAGNOSIS — M8580 Other specified disorders of bone density and structure, unspecified site: Secondary | ICD-10-CM | POA: Insufficient documentation

## 2018-02-15 DIAGNOSIS — Z170 Estrogen receptor positive status [ER+]: Secondary | ICD-10-CM | POA: Diagnosis not present

## 2018-02-15 DIAGNOSIS — Z79899 Other long term (current) drug therapy: Secondary | ICD-10-CM | POA: Insufficient documentation

## 2018-02-15 DIAGNOSIS — Z923 Personal history of irradiation: Secondary | ICD-10-CM | POA: Insufficient documentation

## 2018-02-15 DIAGNOSIS — R1084 Generalized abdominal pain: Secondary | ICD-10-CM

## 2018-02-15 LAB — COMPREHENSIVE METABOLIC PANEL
ALBUMIN: 4.3 g/dL (ref 3.5–5.0)
ALK PHOS: 68 U/L (ref 38–126)
ALT: 34 U/L (ref 0–44)
AST: 28 U/L (ref 15–41)
Anion gap: 8 (ref 5–15)
BILIRUBIN TOTAL: 1.4 mg/dL — AB (ref 0.3–1.2)
BUN: 23 mg/dL (ref 8–23)
CO2: 23 mmol/L (ref 22–32)
Calcium: 10 mg/dL (ref 8.9–10.3)
Chloride: 113 mmol/L — ABNORMAL HIGH (ref 98–111)
Creatinine, Ser: 1.46 mg/dL — ABNORMAL HIGH (ref 0.44–1.00)
GFR calc Af Amer: 44 mL/min — ABNORMAL LOW (ref >60)
GFR calc non Af Amer: 38 mL/min — ABNORMAL LOW (ref >60)
GLUCOSE: 129 mg/dL — AB (ref 70–99)
POTASSIUM: 4.8 mmol/L (ref 3.5–5.1)
Sodium: 144 mmol/L (ref 135–145)
Total Protein: 7.8 g/dL (ref 6.5–8.1)

## 2018-02-15 LAB — CBC WITH DIFFERENTIAL/PLATELET
ABS IMMATURE GRANULOCYTES: 0.01 10*3/uL (ref 0.00–0.07)
BASOS PCT: 1 %
Basophils Absolute: 0 10*3/uL (ref 0.0–0.1)
Eosinophils Absolute: 0.2 10*3/uL (ref 0.0–0.5)
Eosinophils Relative: 2 %
HEMATOCRIT: 40.8 % (ref 36.0–46.0)
HEMOGLOBIN: 14 g/dL (ref 12.0–15.0)
IMMATURE GRANULOCYTES: 0 %
LYMPHS ABS: 1.6 10*3/uL (ref 0.7–4.0)
LYMPHS PCT: 24 %
MCH: 30.6 pg (ref 26.0–34.0)
MCHC: 34.3 g/dL (ref 30.0–36.0)
MCV: 89.3 fL (ref 80.0–100.0)
MONOS PCT: 9 %
Monocytes Absolute: 0.6 10*3/uL (ref 0.1–1.0)
NEUTROS ABS: 4.2 10*3/uL (ref 1.7–7.7)
NEUTROS PCT: 64 %
PLATELETS: 170 10*3/uL (ref 150–400)
RBC: 4.57 MIL/uL (ref 3.87–5.11)
RDW: 13.2 % (ref 11.5–15.5)
WBC: 6.6 10*3/uL (ref 4.0–10.5)
nRBC: 0 % (ref 0.0–0.2)

## 2018-02-15 NOTE — Progress Notes (Signed)
[place]   [phone] [phone]   Clinic Follow up Note   Patient Care Team: Tower, Wynelle Fanny, MD as PCP - General Minus Breeding, MD as Consulting Physician (Cardiology) Holley Bouche, NP (Inactive) as Nurse Practitioner (Nurse Practitioner) Erroll Luna, MD as Consulting Physician (General Surgery) Thea Silversmith, MD as Consulting Physician (Radiation Oncology) [name] Merle, MD as Consulting Physician (Hematology) Susa Day, MD as Consulting Physician (Orthopedic Surgery)  Date of Service:  02/15/2018  CHIEF COMPLAINT: F/u of right breast cancer   SUMMARY OF ONCOLOGIC HISTORY: Oncology History   Cancer Staging Breast cancer of upper-outer quadrant of right female breast [facility]) Staging form: Breast, AJCC 7th Edition - Clinical stage from 01/04/2014: Stage IA (T1b, N0, M0) - Unsigned - Pathologic stage from 02/14/2014: Stage IA (T1b, N0, cM0) - Unsigned       Breast cancer of upper-outer quadrant of right female breast ([place])   12/29/2013 Imaging    Screening mammogram and [place]: an irregular shadowing hypoechoic mass right breast 9 o'clock location 4 cm from the nipple measuring 7 x 7 x 7 mm. No adenopathy     12/29/2013 Initial Diagnosis    Breast cancer of upper-outer quadrant of right female breast    12/29/2013 Initial Biopsy    Grade I-II IDC, ER+ (98%), PR+ (81%), HER2- (ratio 1.24), Ki67 15%     01/11/2014 Procedure    Genetic counseling/testing: Revealed 1 VUS in ATM gene, p.D1641H (c.4921G>C).  Otherwise, genetic testing negative.     02/14/2014 Surgery    Right lumpectomy with SLNB ([place]): Grade 2 IDC spanning 0.9 cm with associated grade 2 DCIS.  Negative margins. 5 sentinel lymph nodes negative.  HER2 repeated and neg. (ratio 1.16).    02/14/2014 Pathologic Stage    pT1bpN0; Stage IA    02/14/2014 Oncotype testing    Recurrence score 21 (13% risk of distant recurrence); no adjuvant chemo offered.     03/22/2014 -  04/19/2014 Radiation Therapy    Adjuvant radiation Pablo Ledger); Right breast: Total dose 42.72 Gy over 21 fractions. Right breast boost: Total dose 10 Gy over 5 fractions.      Anti-estrogen oral therapy    Anti-estrogen therapy was recommended by Dr. Burr Medico; pt declines anti-estrogen treatment.      05/25/2014 Survivorship    Survivorship Care Plan given to patient and reviewed with her in-person.     05/30/2016 Imaging    US Abdomen 05/30/16 IMPRESSION: Status post cholecystectomy. Pancreas not visualized due to overlying bowel gas. Increased echogenicity of hepatic parenchyma is noted diffusely suggesting fatty infiltration or other diffuse hepatocellular disease.    07/02/2016 Imaging    DEXA 07/02/16 T score of -1.8, indicating osteopenia    12/15/2016 Mammogram    IMPRESSION: 1. No mammographic evidence of malignancy in either breast. 2. Stable right breast post lumpectomy changes.      CURRENT THERAPY:  Surveillance   INTERVAL HISTORY:  [name] [name] is here for a follow up of right breast cancer. She was last seen by 1 year ago and seen by NP Lacie 6 months ago. She presents to the clinic today by herself. She notes for the past 6 months she had increasingly worse intermittent upper left abdominal pain that radiates to her back. This pain has caused her nausea and near syncope This pain varies in duration day to day. She has been able to breathe through it and pain will dissipate. This dictates when she can get her activities done.  She has not brought this up to her PCP. She also has GERD and recently changed her medication to Protonix and Pepcid BID by her PCP. Her last endoscopy was 10 years ago. She notes having normal BMs with no issues passing.  She had a vascular stent placed and follows up with cardiologist. She notes prior to stent placement she had minor chest pain. She does not think this is the same type of pain she had currently.     REVIEW OF SYSTEMS:   Constitutional:  Denies fevers, chills or abnormal weight loss Eyes: Denies blurriness of vision Ears, nose, mouth, throat, and face: Denies mucositis or sore throat Respiratory: Denies cough, dyspnea or wheezes Cardiovascular: Denies palpitation, chest discomfort or lower extremity swelling Gastrointestinal:  Denies nausea, heartburn or change in bowel habits (+) intermittent Upper left abdominal pain that radiates to her left back  Skin: Denies abnormal skin rashes Lymphatics: Denies new lymphadenopathy or easy bruising Neurological:Denies numbness, tingling or new weaknesses Behavioral/Psych: Mood is stable, no new changes  All other systems were reviewed with the patient and are negative.  MEDICAL HISTORY:  Past Medical History:  Diagnosis Date  . Anxiety   . Breast cancer ([place])    ER+/PR+/Her2-  . Concussion 05/26/14  . Depression   . Frequent UTI   . GERD (gastroesophageal reflux disease)   . Hypothyroid   . Microscopic hematuria   . Migraine   . Personal history of radiation therapy   . Radiation 03/22/14-04/19/14   Breast Cancer upper-outer  . Restless leg syndrome   . Seasonal allergies   . Stented coronary artery    LAD 95% 2007.  . Vitamin D deficiency     SURGICAL HISTORY: Past Surgical History:  Procedure Laterality Date  . ABDOMINAL HYSTERECTOMY    . APPENDECTOMY    . back surgery  1997   herniated disc repair  . BREAST LUMPECTOMY Right    2016  . CARDIAC CATHETERIZATION  2007   stent  . CHOLECYSTECTOMY OPEN    . CYSTOSCOPY     neg  . RADIOACTIVE SEED GUIDED PARTIAL MASTECTOMY WITH AXILLARY SENTINEL LYMPH NODE BIOPSY Right 02/14/2014   Procedure: RADIOACTIVE SEED GUIDED PARTIAL MASTECTOMY WITH AXILLARY SENTINEL LYMPH NODE BIOPSY;  Surgeon: Erroll Luna, MD;  Location: [place];  Service: General;  Laterality: Right;  . SHOULDER SURGERY      I have reviewed the social history and family history with the patient and they are unchanged from previous  note.  ALLERGIES:  is allergic to iohexol.  MEDICATIONS:  Current Outpatient Medications  Medication Sig Dispense Refill  . aspirin 81 MG tablet Take 81 mg by mouth daily.    . cephALEXin (KEFLEX) 250 MG capsule Take 250 mg by mouth at bedtime.     Marland Kitchen CINNAMON PO Take 1,000 mg by mouth daily.     . Coenzyme Q10 (CO Q-10) 200 MG CAPS Take 1 capsule by mouth daily.      . famotidine (PEPCID) 40 MG tablet Take 1 tablet (40 mg total) by mouth 2 (two) times daily. 180 tablet 3  . fish oil-omega-3 fatty acids 1000 MG capsule Take 1.2 g by mouth 2 (two) times daily.    . Fremanezumab-vfrm (AJOVY) 225 MG/1.5ML SOSY Inject 225 mg into the skin every 30 (thirty) days. 1.5 mL 5  . furosemide (LASIX) 20 MG tablet TAKE 1 TABLET (20 MG TOTAL) BY MOUTH DAILY. 90 tablet 3  . Ginkgo Biloba 120 MG CAPS Take  1 capsule by mouth daily.      Marland Kitchen levothyroxine (SYNTHROID, LEVOTHROID) 137 MCG tablet Take 137 mcg by mouth daily before breakfast.     . Magnesium 250 MG TABS Take 250 mg by mouth daily.    . Multiple Vitamin (MULTIVITAMIN) capsule Take 1 capsule by mouth daily.    . nitroGLYCERIN (NITROSTAT) 0.4 MG SL tablet Place 1 tablet (0.4 mg total) under the tongue every 5 (five) minutes as needed. Pain 25 tablet 3  . pantoprazole (PROTONIX) 40 MG tablet TAKE 1 TABLET (40 MG TOTAL) BY MOUTH 2 (TWO) TIMES DAILY. 180 tablet 3  . potassium chloride SA (KLOR-CON M20) 20 MEQ tablet TAKE TWO TABLETS BY MOUTH TWICE DAILY AND ONE-HALF AT BEDTIME 405 tablet 3  . propranolol ER (INDERAL LA) 60 MG 24 hr capsule TAKE 1 CAPSULE BY MOUTH ONCE DAILY AT BEDTIME 90 capsule 0  . Riboflavin 400 MG TABS Take 400 mg by mouth daily.    . simvastatin (ZOCOR) 40 MG tablet Take 1 tablet (40 mg total) by mouth at bedtime. 90 tablet 3  . topiramate (TOPAMAX) 100 MG tablet Take 1 tablet (100 mg total) by mouth 2 (two) times daily. 60 tablet 5  . XIIDRA 5 % SOLN Apply 1 drop to eye 2 (two) times daily. 1 drop in each eye Twice daily    .  metoprolol tartrate (LOPRESSOR) 25 MG tablet Take 0.5 tablets (12.5 mg total) by mouth every 4 (four) hours as needed (As needed for palpitations). 30 tablet 5   No current facility-administered medications for this visit.     PHYSICAL EXAMINATION: ECOG PERFORMANCE STATUS: 1 - Symptomatic but completely ambulatory  There were no vitals filed for this visit. There were no vitals filed for this visit.  GENERAL:alert, no distress and comfortable SKIN: skin color, texture, turgor are normal, no rashes or significant lesions EYES: normal, Conjunctiva are pink and non-injected, sclera clear OROPHARYNX:no exudate, no erythema and lips, buccal mucosa, and tongue normal  NECK: supple, thyroid normal size, non-tender, without nodularity LYMPH:  no palpable lymphadenopathy in the cervical, axillary or inguinal LUNGS: clear to auscultation and percussion with normal breathing effort HEART: regular rate & rhythm and no murmurs and no lower extremity edema ABDOMEN:abdomen soft, normal bowel sounds (+) Epigastric abdominal pain,  no organomegaly or palpable mass  Musculoskeletal:no cyanosis of digits and no clubbing  NEURO: alert & oriented x 3 with fluent speech, no focal motor/sensory deficits BREAST: S/p right breast lumpectomy: surgical incision healed well, no palpable mass or adenopathy in either breast, Benign   LABORATORY DATA:  I have reviewed the data as listed CBC Latest Ref Rng & Units 02/15/2018 08/18/2017 06/26/2017  WBC 4.0 - 10.5 K/uL 6.6 5.1 6.4  Hemoglobin 12.0 - 15.0 g/dL 14.0 13.0 13.3  Hematocrit 36.0 - 46.0 % 40.8 38.1 38.5  Platelets 150 - 400 K/uL 170 155 186     CMP Latest Ref Rng & Units 02/15/2018 08/18/2017 06/26/2017  Glucose 70 - 99 mg/dL 129(H) 105(H) 107(H)  BUN 8 - 23 mg/dL _0 Creatinine 0.44 - 1.00 mg/dL 1.46(H) 1.34(H) 1.20(H)  Sodium 135 - 145 mmol/L 144 143 139  Potassium 3.5 - 5.1 mmol/L 4.8 4.4 4.4  Chloride 98 - 111 mmol/L 113(H) 113(H) 108  CO2 22 -  32 mmol/L _1 Calcium 8.9 - 10.3 mg/dL 10.0 10.0 9.9  Total Protein 6.5 - 8.1 g/dL 7.8 7.3 6.8  Total Bilirubin 0.3 - 1.2 mg/dL 1.4(H)  1.1 1.2  Alkaline Phos 38 - 126 U/L 68 68 -  AST 15 - 41 U/L _0 ALT 0 - 44 U/L 34 22 25      RADIOGRAPHIC STUDIES: I have personally reviewed the radiological images as listed and agreed with the findings in the report. No results found.   ASSESSMENT & PLAN:  CHELISE HANGER is a 63 y.o. female with   1.  pT1b N0 M0 stage IA invasive ductal carcinoma of right breast, grade II, ER/PR strongly positive, HER-2 negative, Oncotype RS 21 -She was diagnosed in 12/2013. She is status post right breast lumpectomy, adjuvant breast radiation. She declined adjuvant antiestrogen therapy, due to the concern of side effects. -Lab reviewed, her CBC and CMP WNL except Glucose at 129, Cr at 1.46 and total bili elevated at 1.4. Her breast exam and her 11/2017 mammogram were unremarkable.  -Given persistent LUQ pain for 3-4 months, I will get CT AP for further evaluation.  -F/u in 6 months if CT negative    2. Intermittent LUQ abdominal pain, nausea   -She is on Protonix and Pepcid BID for GERD -This is likely related to her stomach, bowel or pancrease -Her last Endoscopy and colonoscopy was  10 years ago.  -I discussed getting a CT AP to further evaluate. If scan negative I recommend a endoscopy with GI. She is agreeable.  -Given past reaction to contrast and CKD, will do scan WO IV Contrast.   3. Osteopenia -last DEXA was in 06/2016, no high risk of fracture. -Continue Calcium and vitamin D supplements.  -Next DEXA in 06/2018   4. Mild hyperbilirubinemia  -She has had mild intermittent elevated total bilirubin since 01/2015, liver enzymes are normal -total bili at 1.4 today (02/15/2018)  5. CKD, Stage III -Cr at 1.46 today (02/15/2018), overall stable  -Continue to follow up with nephrologist    Plan CT abdomen WO contrast in 1-2 weeks, I will  call her with results. If negative, will refer her back to [facility] and f/u in 6 months     No problem-specific Assessment & Plan notes found for this encounter.   Orders Placed This Encounter  Procedures  . CT Abdomen Wo Contrast    LUQ abdominal pain with radiation to back    Standing Status:   Future    Standing Expiration Date:   02/15/2019    Order Specific Question:   Preferred imaging location?    Answer:   [facility]    Order Specific Question:   Is Oral Contrast requested for this exam?    Answer:   Yes, Per Radiology protocol    Order Specific Question:   Radiology Contrast Protocol - do NOT remove file path    Answer:   [email or web address]   All questions were answered. The patient knows to call the clinic with any problems, questions or concerns. No barriers to learning was detected. I spent 25 minutes counseling the patient face to face. The total time spent in the appointment was 30 minutes and more than 50% was on counseling and review of test results     [name] Merle, MD 02/15/2018   I, Joslyn Devon, am acting as scribe for [name] Merle, MD.   I have reviewed the above documentation for accuracy and completeness, and I agree with the above.

## 2018-02-15 NOTE — Telephone Encounter (Signed)
Scheduled appts per 01/20 los. Gave patient the number to central radiology.  Printed calendar and avs.

## 2018-02-22 ENCOUNTER — Other Ambulatory Visit: Payer: Self-pay | Admitting: Hematology

## 2018-02-22 DIAGNOSIS — R1084 Generalized abdominal pain: Secondary | ICD-10-CM

## 2018-03-01 ENCOUNTER — Encounter (HOSPITAL_COMMUNITY): Payer: Self-pay

## 2018-03-01 ENCOUNTER — Ambulatory Visit (HOSPITAL_COMMUNITY): Payer: Medicare Other

## 2018-03-01 ENCOUNTER — Ambulatory Visit (HOSPITAL_COMMUNITY)
Admission: RE | Admit: 2018-03-01 | Discharge: 2018-03-01 | Disposition: A | Discharge status: Home / Self Care | Payer: Medicare Other | Source: Ambulatory Visit | Attending: Hematology | Admitting: Hematology

## 2018-03-01 DIAGNOSIS — R1084 Generalized abdominal pain: Secondary | ICD-10-CM

## 2018-03-01 DIAGNOSIS — K5730 Diverticulosis of large intestine without perforation or abscess without bleeding: Secondary | ICD-10-CM | POA: Diagnosis not present

## 2018-03-02 DIAGNOSIS — E890 Postprocedural hypothyroidism: Secondary | ICD-10-CM | POA: Diagnosis not present

## 2018-03-02 NOTE — Progress Notes (Signed)
We will see her - either me or an APP can see her and we can go from there

## 2018-03-04 ENCOUNTER — Telehealth: Payer: Self-pay

## 2018-03-04 NOTE — Telephone Encounter (Signed)
Spoke with patient regarding CT scan results.  Per Dr. Burr Medico no concerns, instructed to f/u with Dr. Carlean Purl for c/o of abdominal discomfort.

## 2018-03-04 NOTE — Telephone Encounter (Signed)
-----   Message from Truitt Merle, MD sent at 03/02/2018 11:04 AM EST ----- Malachy Mood, please let pt know her CT scan results, and let her call GI Dr. Celesta Aver office for appointment,  thanks   Glendell Docker, she complains of abdominal discomfort and nausea, could you see her back? Her CT abdomen was unremarkable.

## 2018-03-10 DIAGNOSIS — E559 Vitamin D deficiency, unspecified: Secondary | ICD-10-CM | POA: Diagnosis not present

## 2018-03-10 DIAGNOSIS — E890 Postprocedural hypothyroidism: Secondary | ICD-10-CM | POA: Diagnosis not present

## 2018-03-17 DIAGNOSIS — R300 Dysuria: Secondary | ICD-10-CM | POA: Diagnosis not present

## 2018-03-17 DIAGNOSIS — N3281 Overactive bladder: Secondary | ICD-10-CM | POA: Diagnosis not present

## 2018-03-17 DIAGNOSIS — N3020 Other chronic cystitis without hematuria: Secondary | ICD-10-CM | POA: Diagnosis not present

## 2018-03-22 ENCOUNTER — Ambulatory Visit (INDEPENDENT_AMBULATORY_CARE_PROVIDER_SITE_OTHER): Payer: Medicare Other | Admitting: Psychiatry

## 2018-03-22 DIAGNOSIS — Z8659 Personal history of other mental and behavioral disorders: Secondary | ICD-10-CM

## 2018-03-22 DIAGNOSIS — F331 Major depressive disorder, recurrent, moderate: Secondary | ICD-10-CM

## 2018-03-22 DIAGNOSIS — E039 Hypothyroidism, unspecified: Secondary | ICD-10-CM

## 2018-03-22 DIAGNOSIS — Z853 Personal history of malignant neoplasm of breast: Secondary | ICD-10-CM

## 2018-03-22 DIAGNOSIS — R002 Palpitations: Secondary | ICD-10-CM | POA: Diagnosis not present

## 2018-03-22 DIAGNOSIS — Z8619 Personal history of other infectious and parasitic diseases: Secondary | ICD-10-CM

## 2018-03-22 DIAGNOSIS — F411 Generalized anxiety disorder: Secondary | ICD-10-CM | POA: Diagnosis not present

## 2018-03-22 DIAGNOSIS — F341 Dysthymic disorder: Secondary | ICD-10-CM

## 2018-03-22 NOTE — Progress Notes (Signed)
Psychotherapy Progress Note [facility] [name] Moore, PhD LP  Patient ID: [name] [name]     MRN: [id number]     Therapy format: Individual psychotherapy Date: 03/22/2018     Start: 3:25p Stop: 4:15p Time Spent: 50 min  Session narrative -- presenting needs, interim history, self-report of stressors and symptoms, applications of prior therapy, status changes, and interventions made in session Feels in a spin, hearing deteriorating, one doctor's appt after another, feels they are tripping over each other.  Every time she catches herself wondering if it's just her self and her anxiety, she gets some news or some piece of activism about her health.  On point of frustration is Dr. Chalmers Cater (endocrinologist, covering thyroid function s/p radio-iodine treatment a few years ago).  C/o cold all the time, heart palpitations, and says she is having sxs of both hypo- and hyperthyroidism.  Nauseous every day, pain in her side, but doesn't want to delve into it.  Just tested, confirmed hypothyroidism (TSH test only).  Says Dr. Chalmers Cater took the results off line, raised Levothroid, suggested again that PT see a rheumatologist of her acquaintance, whom PT had run-in with a while back.    Discussed possibilities why PT could have palpitations with hypothyroidism, possibility that it is autoimmune, e.g., Hashimoto's.    Breast cancer follow-up, did get out of her that she was nauseous and had pain, gave her a CT, found all OK except a gas bubble in her bladder.  Regular urologist Karsten Ro) was out, saw a very nice female colleague Suzan Slick), who ultrasounded and found gas bubbles around the bladder.  Rx'd antibiotics, not filled yet.  Yet to see gastroenterologist about it.  Has been on antibiotics a lot before.  Offered the possibility of a high-fermentation diet being involved, and for some reason gas has permeated the bowel.  Meanwhile, needs a dentist for a recently broken tooth.    PT looked up  some info online that led to a form, filled out by brother Abe People, where he did not list PT as kin but as an "associate", had inlaws listed as relatives, and listed PT's address as a temp address of his for a year.  Hurts, as it looks like more reasons to see herself as unclaimed, rejected.  Challenged on the possibility that the form was generated by a computer algorithm, not authored by [employer], and therefore liable to mischaracterize her with no malice intended.  Doubtful, but wiling to think about it.  Therapeutic modalities: Cognitive Behavioral Therapy and Ego-Supportive  Mental Status/Observations:  Appearance:   Casual     Behavior:  Appropriate  Motor:  Normal  Speech/Language:   Clear and Coherent  Affect:  Appropriate and mildly cynical  Mood:  dysthymic  Thought process:  normal  Thought content:    WNL  Sensory/Perceptual disturbances:    WNL  Orientation:  within normal limits  Attention:  Good  Concentration:  Good  Memory:  WNL  Insight:    Fair  Judgment:   Good  Impulse Control:  Good   Risk Assessment: Danger to Self:  No Self-injurious Behavior: No Danger to Others: No Duty to Warn:no Physical Aggression / Violence:No  Access to Firearms a concern: No   Diagnosis:   ICD-10-CM   1. Generalized anxiety disorder F41.1   2. Early onset dysthymia F34.1   3. Palpitation R00.2   4. Hypothyroidism, unspecified type E03.9   5. History of posttraumatic stress disorder (PTSD) Z86.59   6.  History of breast cancer Z85.3   7. History of tick-borne disease Z86.19   8. Major depressive disorder, recurrent episode, moderate (HCC) F33.1     Assessment of progress:  stable  Plan:  . Fact-check if needed about the form . Look into FODMAP as a possibility for gas issue . Continue to utilize previously learned skills ad lib Remains disabled from any occupation due to symptom severity. . Maintain medication as prescribed and work faithfully with relevant prescriber(s) if any  changes are desired or seem indicated . Call the clinic on-call service, present to ER, or call 911 if any life-threatening emergency Return in about 1 month (around 04/20/2018).   Blanchie Serve, PhD [place] Licensed Psychologist

## 2018-03-27 ENCOUNTER — Other Ambulatory Visit: Payer: Self-pay | Admitting: Cardiology

## 2018-04-01 ENCOUNTER — Other Ambulatory Visit: Payer: Self-pay | Admitting: *Deleted

## 2018-04-01 ENCOUNTER — Telehealth: Payer: Self-pay | Admitting: *Deleted

## 2018-04-01 MED ORDER — FAMOTIDINE 20 MG PO TABS: 40.0000 mg | ORAL_TABLET | Freq: Two times a day (BID) | ORAL | 0 refills | Status: DC

## 2018-04-01 NOTE — Telephone Encounter (Signed)
I sent it  

## 2018-04-01 NOTE — Telephone Encounter (Signed)
Received fax from [facility] stating Famotidine 40 mg tablet is not available.  Asking for Rx for Famotidine 20 mg to take 2 tablets by mouth twice a day.  New Rx sent as requested.

## 2018-04-01 NOTE — Addendum Note (Signed)
Addended by: Loura Pardon A on: 04/01/2018 04:52 PM   Modules accepted: Orders

## 2018-04-08 DIAGNOSIS — E890 Postprocedural hypothyroidism: Secondary | ICD-10-CM | POA: Diagnosis not present

## 2018-04-20 ENCOUNTER — Ambulatory Visit: Payer: Medicare Other | Admitting: Internal Medicine

## 2018-04-27 ENCOUNTER — Ambulatory Visit: Payer: Medicare Other | Admitting: Cardiology

## 2018-05-04 ENCOUNTER — Ambulatory Visit: Payer: Medicare Other | Admitting: Psychiatry

## 2018-05-24 ENCOUNTER — Telehealth: Payer: Self-pay | Admitting: *Deleted

## 2018-05-24 NOTE — Telephone Encounter (Signed)
05/24/18 LMOM @ 11:57 am, re: follow up appointment.

## 2018-06-04 ENCOUNTER — Telehealth: Payer: Self-pay | Admitting: Cardiology

## 2018-06-04 NOTE — Telephone Encounter (Signed)
LVM to schedule recall. °

## 2018-06-15 DIAGNOSIS — E890 Postprocedural hypothyroidism: Secondary | ICD-10-CM | POA: Diagnosis not present

## 2018-06-15 DIAGNOSIS — E559 Vitamin D deficiency, unspecified: Secondary | ICD-10-CM | POA: Diagnosis not present

## 2018-06-28 ENCOUNTER — Other Ambulatory Visit: Payer: Self-pay | Admitting: Neurology

## 2018-06-30 ENCOUNTER — Encounter: Payer: Medicare Other | Admitting: Family Medicine

## 2018-06-30 ENCOUNTER — Ambulatory Visit: Payer: Medicare Other

## 2018-07-03 ENCOUNTER — Other Ambulatory Visit: Payer: Self-pay | Admitting: Cardiology

## 2018-07-03 ENCOUNTER — Other Ambulatory Visit: Payer: Self-pay | Admitting: Family Medicine

## 2018-08-10 ENCOUNTER — Telehealth: Payer: Self-pay | Admitting: Hematology

## 2018-08-10 NOTE — Telephone Encounter (Signed)
YF out 7/20 moved appointments to 8/14. Not able to reach patient by phone or leave message. Schedule mailed. Patient also My Chart active.

## 2018-08-16 ENCOUNTER — Other Ambulatory Visit: Payer: Medicare Other

## 2018-08-16 ENCOUNTER — Ambulatory Visit: Payer: Medicare Other | Admitting: Hematology

## 2018-09-01 ENCOUNTER — Other Ambulatory Visit: Payer: Self-pay

## 2018-09-01 ENCOUNTER — Ambulatory Visit (INDEPENDENT_AMBULATORY_CARE_PROVIDER_SITE_OTHER): Payer: Medicare Other | Admitting: Adult Health

## 2018-09-01 ENCOUNTER — Encounter: Payer: Self-pay | Admitting: Adult Health

## 2018-09-01 VITALS — BP 122/68 | HR 51 | Temp 97.8°F | Ht 68.0 in | Wt 208.6 lb

## 2018-09-01 DIAGNOSIS — G43009 Migraine without aura, not intractable, without status migrainosus: Secondary | ICD-10-CM

## 2018-09-01 MED ORDER — TOPIRAMATE 100 MG PO TABS: 100.0000 mg | ORAL_TABLET | Freq: Two times a day (BID) | ORAL | 3 refills | Status: DC

## 2018-09-01 NOTE — Progress Notes (Signed)
I have read the note, and I agree with the clinical assessment and plan.  [name] [name] Iden   

## 2018-09-01 NOTE — Progress Notes (Signed)
PATIENT: [name] [name] DOB: 1955-05-13  REASON FOR VISIT: follow up HISTORY FROM: patient  HISTORY OF PRESENT ILLNESS: Today 09/01/18:  [name] [name] is a 63 year old female with intractable migraine headaches.  She returns today for follow-up.  At the last visit she was switched to [place].  She reports that she never started this medication.  She states that she has "so many problems" that she did not want to take any "additional medication."  Currently she feels that her headaches are under control.  She states that she is actually gone a month between her headache episodes.  She feels that Topamax is beneficial.  She returns today for follow-up.  HISTORY 02/03/18: [name] [name] is a 63 year old female with a history of intractable migraine headaches.  She returns today for follow-up.  At the last visit she was started on Aimovig.  She states that this was okay at first but then she began having weird sensations in her head.  Reports that it felt like she had a balloon in her head.  She states that she eventually stopped the medication.  She states after couple weeks the sensation subsided.  She states that her headache frequency varies.  Some months she may have 4-5 headaches another month she may have 2 headaches.  She states occasionally with her headaches she will have flashes of light.  Her headaches always occur on the left side of the head.  She does have photophobia and phonophobia as well as nausea.  She has tried and failed multiple medications in the past.  She has had a CT of the head back in 2016 that was unremarkable.  She returns today for evaluation.  REVIEW OF SYSTEMS: Out of a complete 14 system review of symptoms, the patient complains only of the following symptoms, and all other reviewed systems are negative.  See HPI ALLERGIES: Allergies  Allergen Reactions  . Iohexol      Desc: throat selling resp distress hives.premedicate pt.entered 07/06/04, bsw.     HOME  MEDICATIONS: Outpatient Medications Prior to Visit  Medication Sig Dispense Refill  . aspirin 81 MG tablet Take 81 mg by mouth daily.    Marland Kitchen CINNAMON PO Take 1,000 mg by mouth daily.     . Coenzyme Q10 (CO Q-10) 200 MG CAPS Take 1 capsule by mouth daily.      . fish oil-omega-3 fatty acids 1000 MG capsule Take 1.2 g by mouth 2 (two) times daily.    . furosemide (LASIX) 20 MG tablet TAKE 1 TABLET (20 MG TOTAL) BY MOUTH DAILY. 90 tablet 3  . Ginkgo Biloba 120 MG CAPS Take 1 capsule by mouth daily.      Marland Kitchen levothyroxine (SYNTHROID, LEVOTHROID) 137 MCG tablet Take 137 mcg by mouth daily before breakfast.     . Magnesium 250 MG TABS Take 250 mg by mouth daily.    . Multiple Vitamin (MULTIVITAMIN) capsule Take 1 capsule by mouth daily.    . nitroGLYCERIN (NITROSTAT) 0.4 MG SL tablet Place 1 tablet (0.4 mg total) under the tongue every 5 (five) minutes as needed. Pain 25 tablet 3  . pantoprazole (PROTONIX) 40 MG tablet Take 1 tablet by mouth twice daily 180 tablet 0  . potassium chloride SA (K-DUR) 20 MEQ tablet TAKE 2 TABLETS BY MOUTH TWICE DAILY AND 1/2 (ONE-HALF) AT BEDTIME 405 tablet 0  . propranolol ER (INDERAL LA) 60 MG 24 hr capsule Take 1 capsule by mouth once daily at bedtime 90 capsule 3  .  Riboflavin 400 MG TABS Take 400 mg by mouth daily.    . simvastatin (ZOCOR) 40 MG tablet Take 1 tablet (40 mg total) by mouth at bedtime. OV NEEDED 45 tablet 0  . topiramate (TOPAMAX) 100 MG tablet Take 1 tablet by mouth twice daily 60 tablet 2  . XIIDRA 5 % SOLN Apply 1 drop to eye 2 (two) times daily. 1 drop in each eye Twice daily    . famotidine (PEPCID) 20 MG tablet Take 2 tablets (40 mg total) by mouth 2 (two) times daily. 360 tablet 0  . metoprolol tartrate (LOPRESSOR) 25 MG tablet Take 0.5 tablets (12.5 mg total) by mouth every 4 (four) hours as needed (As needed for palpitations). 30 tablet 5  . Fremanezumab-vfrm (AJOVY) 225 MG/1.5ML SOSY Inject 225 mg into the skin every 30 (thirty) days. 1.5 mL 5    No facility-administered medications prior to visit.     PAST MEDICAL HISTORY: Past Medical History:  Diagnosis Date  . Anxiety   . Breast cancer ([place])    ER+/PR+/Her2-  . Concussion 05/26/14  . Depression   . Frequent UTI   . GERD (gastroesophageal reflux disease)   . Hypothyroid   . Microscopic hematuria   . Migraine   . Personal history of radiation therapy   . Radiation 03/22/14-04/19/14   Breast Cancer upper-outer  . Restless leg syndrome   . Seasonal allergies   . Stented coronary artery    LAD 95% 2007.  . Vitamin D deficiency     PAST SURGICAL HISTORY: Past Surgical History:  Procedure Laterality Date  . ABDOMINAL HYSTERECTOMY    . APPENDECTOMY    . back surgery  1997   herniated disc repair  . BREAST LUMPECTOMY Right    2016  . CARDIAC CATHETERIZATION  2007   stent  . CHOLECYSTECTOMY OPEN    . CYSTOSCOPY     neg  . RADIOACTIVE SEED GUIDED PARTIAL MASTECTOMY WITH AXILLARY SENTINEL LYMPH NODE BIOPSY Right 02/14/2014   Procedure: RADIOACTIVE SEED GUIDED PARTIAL MASTECTOMY WITH AXILLARY SENTINEL LYMPH NODE BIOPSY;  Surgeon: Erroll Luna, MD;  Location: [place];  Service: General;  Laterality: Right;  . SHOULDER SURGERY      FAMILY HISTORY: Family History  Problem Relation Age of Onset  . Coronary artery disease Mother   . Thyroid disease Mother   . Alzheimer's disease Mother   . Diabetes Mother   . Heart disease Mother   . Cancer Brother        melnoma  . Ovarian cancer Paternal Aunt 23  . Bone cancer Paternal Uncle   . Alzheimer's disease Maternal Grandmother   . Heart disease Maternal Grandfather   . Pancreatic cancer Maternal Grandfather 14  . Ovarian cancer Maternal Aunt        dx in 59s  . Throat cancer Maternal Uncle        smoker  . Lung cancer Maternal Uncle        heavy smoker  . Ovarian cancer Paternal Aunt        dx in her 22s  . Cancer Cousin        "female cancer"  . Cancer Father     SOCIAL HISTORY: Social  History   Socioeconomic History  . Marital status: Widowed    Spouse name: Not on file  . Number of children: 1  . Years of education: some coll.  . Highest education level: Not on file  Occupational History  . Occupation:  not working  Social Needs  . Financial resource strain: Not on file  . Food insecurity    Worry: Not on file    Inability: Not on file  . Transportation needs    Medical: Not on file    Non-medical: Not on file  Tobacco Use  . Smoking status: Never Smoker  . Smokeless tobacco: Never Used  Substance and Sexual Activity  . Alcohol use: No    Alcohol/week: 0.0 standard drinks    Comment: socially  . Drug use: No  . Sexual activity: Not Currently  Lifestyle  . Physical activity    Days per week: Not on file    Minutes per session: Not on file  . Stress: Not on file  Relationships  . Social Herbalist on phone: Not on file    Gets together: Not on file    Attends religious service: Not on file    Active member of club or organization: Not on file    Attends meetings of clubs or organizations: Not on file    Relationship status: Not on file  . Intimate partner violence    Fear of current or ex partner: Not on file    Emotionally abused: Not on file    Physically abused: Not on file    Forced sexual activity: Not on file  Other Topics Concern  . Not on file  Social History Narrative   Does not get any regular exercise      Doctors   -uro-Ottlein   -cardio--hochrein   -Neurology--Dr. Jannifer Franklin   -allergies--Dr. Carmelina Peal   Gyn past--Dr. Warnell Forester   Couselor--Dr. Mitchum   Endo-- Dr. Chalmers Cater      Lives with son who is 62 years old   Patient is right handed.   Patient drinks 1-2 cups of caffeine daily.      PHYSICAL EXAM  Vitals:   09/01/18 1438  BP: 122/68  Pulse: (!) 51  Temp: 97.8 F (36.6 C)  Weight: 208 lb 9.6 oz (94.6 kg)  Height: _0  (1.727 m)   Body mass index is 31.72 kg/m.  Generalized: Well developed, in no acute  distress   Neurological examination  Mentation: Alert oriented to time, place, history taking. Follows all commands speech and language fluent Cranial nerve II-XII:  Extraocular movements were full, visual field were full on confrontational test Head turning and shoulder shrug  were normal and symmetric. Motor: The motor testing reveals 5 over 5 strength of all 4 extremities. Good symmetric motor tone is noted throughout.  Sensory: Sensory testing is intact to soft touch on all 4 extremities. No evidence of extinction is noted.  Coordination: Cerebellar testing reveals good finger-nose-finger and heel-to-shin bilaterally.  Gait and station: Gait is normal. Reflexes: Deep tendon reflexes are symmetric and normal bilaterally.   DIAGNOSTIC DATA (LABS, IMAGING, TESTING) - I reviewed patient records, labs, notes, testing and imaging myself where available.  Lab Results  Component Value Date   WBC 6.6 02/15/2018   HGB 14.0 02/15/2018   HCT 40.8 02/15/2018   MCV 89.3 02/15/2018   PLT 170 02/15/2018      Component Value Date/Time   NA 144 02/15/2018 1448   NA 145 08/15/2016 1326   K 4.8 02/15/2018 1448   K 4.8 08/15/2016 1326   CL 113 (H) 02/15/2018 1448   CO2 23 02/15/2018 1448   CO2 22 08/15/2016 1326   GLUCOSE 129 (H) 02/15/2018 1448   GLUCOSE 129 08/15/2016 1326  BUN 23 02/15/2018 1448   BUN 15.4 08/15/2016 1326   CREATININE 1.46 (H) 02/15/2018 1448   CREATININE 1.20 (H) 06/26/2017 1531   CREATININE 1.5 (H) 08/15/2016 1326   CALCIUM 10.0 02/15/2018 1448   CALCIUM 10.4 08/15/2016 1326   PROT 7.8 02/15/2018 1448   PROT 7.5 08/15/2016 1326   ALBUMIN 4.3 02/15/2018 1448   ALBUMIN 4.2 08/15/2016 1326   AST 28 02/15/2018 1448   AST 28 08/15/2016 1326   ALT 34 02/15/2018 1448   ALT 27 08/15/2016 1326   ALKPHOS 68 02/15/2018 1448   ALKPHOS 58 08/15/2016 1326   BILITOT 1.4 (H) 02/15/2018 1448   BILITOT 1.62 (H) 08/15/2016 1326   GFRNONAA 38 (L) 02/15/2018 1448   GFRAA 44 (L)  02/15/2018 1448   Lab Results  Component Value Date   CHOL 150 06/26/2017   HDL 48 (L) 06/26/2017   LDLCALC 74 06/26/2017   TRIG 188 (H) 06/26/2017   CHOLHDL 3.1 06/26/2017   Lab Results  Component Value Date   HGBA1C 5.7 (H) 06/26/2017   Lab Results  Component Value Date   VITAMINB12 329 12/11/2016   Lab Results  Component Value Date   TSH 0.23 (L) 06/26/2017      ASSESSMENT AND PLAN 63 y.o. year old female  has a past medical history of Anxiety, Breast cancer ([place]), Concussion (05/26/14), Depression, Frequent UTI, GERD (gastroesophageal reflux disease), Hypothyroid, Microscopic hematuria, Migraine, Personal history of radiation therapy, Radiation (03/22/14-04/19/14), Restless leg syndrome, Seasonal allergies, Stented coronary artery, and Vitamin D deficiency. here with:  1.  Migraine headaches  Overall the patient feels that her migraines are under good control with Topamax.  She will continue on Topamax.  She will follow-up in 1 year or sooner if needed.   I spent 15 minutes with the patient. 50% of this time was spent discussing her plan of care   Ward Givens, MSN, NP-C 09/01/2018, 2:34 PM [facility] [address] [place], [place] [address] [phone]

## 2018-09-01 NOTE — Patient Instructions (Signed)
Your Plan:  Continue Topamax If your symptoms worsen or you develop new symptoms please let us know.   Thank you for coming to see us at [facility]. I hope we have been able to provide you high quality care today.  You may receive a patient satisfaction survey over the next few weeks. We would appreciate your feedback and comments so that we may continue to improve ourselves and the health of our patients.  

## 2018-09-05 ENCOUNTER — Other Ambulatory Visit: Payer: Self-pay | Admitting: Cardiology

## 2018-09-08 NOTE — Progress Notes (Signed)
[place]   [phone] [phone]   Clinic Follow up Note   Patient Care Team: Tower, Wynelle Fanny, MD as PCP - General Minus Breeding, MD as Consulting Physician (Cardiology) Holley Bouche, NP (Inactive) as Nurse Practitioner (Nurse Practitioner) Erroll Luna, MD as Consulting Physician (General Surgery) Thea Silversmith, MD as Consulting Physician (Radiation Oncology) Truitt Merle, MD as Consulting Physician (Hematology) Susa Day, MD as Consulting Physician (Orthopedic Surgery)  Date of Service:  09/10/2018  CHIEF COMPLAINT: F/u of right breast cancer   SUMMARY OF ONCOLOGIC HISTORY: Oncology History Overview Note  Cancer Staging Breast cancer of upper-outer quadrant of right female breast [facility]) Staging form: Breast, AJCC 7th Edition - Clinical stage from 01/04/2014: Stage IA (T1b, N0, M0) - Unsigned - Pathologic stage from 02/14/2014: Stage IA (T1b, N0, cM0) - Unsigned     Breast cancer of upper-outer quadrant of right female breast ([place])  12/29/2013 Imaging   Screening mammogram and [place]: an irregular shadowing hypoechoic mass right breast 9 o'clock location 4 cm from the nipple measuring 7 x 7 x 7 mm. No adenopathy    12/29/2013 Initial Diagnosis   Breast cancer of upper-outer quadrant of right female breast   12/29/2013 Initial Biopsy   Grade I-II IDC, ER+ (98%), PR+ (81%), HER2- (ratio 1.24), Ki67 15%    01/11/2014 Procedure   Genetic counseling/testing: Revealed 1 VUS in ATM gene, p.D1641H (c.4921G>C).  Otherwise, genetic testing negative.    02/14/2014 Surgery   Right lumpectomy with SLNB ([place]): Grade 2 IDC spanning 0.9 cm with associated grade 2 DCIS.  Negative margins. 5 sentinel lymph nodes negative.  HER2 repeated and neg. (ratio 1.16).   02/14/2014 Pathologic Stage   pT1bpN0; Stage IA   02/14/2014 Oncotype testing   Recurrence score 21 (13% risk of distant recurrence); no adjuvant chemo offered.    03/22/2014 -  04/19/2014 Radiation Therapy   Adjuvant radiation Pablo Ledger); Right breast: Total dose 42.72 Gy over 21 fractions. Right breast boost: Total dose 10 Gy over 5 fractions.     Anti-estrogen oral therapy   Anti-estrogen therapy was recommended by Dr. Burr Medico; pt declines anti-estrogen treatment.     05/25/2014 Survivorship   Survivorship Care Plan given to patient and reviewed with her in-person.    05/30/2016 Imaging   US Abdomen 05/30/16 IMPRESSION: Status post cholecystectomy. Pancreas not visualized due to overlying bowel gas. Increased echogenicity of hepatic parenchyma is noted diffusely suggesting fatty infiltration or other diffuse hepatocellular disease.   07/02/2016 Imaging   DEXA 07/02/16 T score of -1.8, indicating osteopenia   12/15/2016 Mammogram   IMPRESSION: 1. No mammographic evidence of malignancy in either breast. 2. Stable right breast post lumpectomy changes.      CURRENT THERAPY:  Surveillance   INTERVAL HISTORY:  [name] [name] is here for a follow up of right breast cancer. She was last seen by me 7 months ago. She presents to the clinic alone. She notes she is doing well. She notes no new changes for her. She notes her intermittent LUQ is less frequent and nausea mild and stable. She feels this may be related to her medications like antiacids it might start after that. I reviewed her medication list. She is on Topamax for her migraines which she has been on that for a while.  She does not routinely see her PCP, only as needed.    REVIEW OF SYSTEMS:   Constitutional: Denies fevers, chills or abnormal weight loss Eyes: Denies blurriness of vision  Ears, nose, mouth, throat, and face: Denies mucositis or sore throat Respiratory: Denies cough, dyspnea or wheezes Cardiovascular: Denies palpitation, chest discomfort or lower extremity swelling Gastrointestinal:  Denies heartburn (+) less frequent LUQ pain (+) Nausea  Skin: Denies abnormal skin rashes Lymphatics: Denies  new lymphadenopathy or easy bruising Neurological:Denies numbness, tingling or new weaknesses Behavioral/Psych: Mood is stable, no new changes  All other systems were reviewed with the patient and are negative.  MEDICAL HISTORY:  Past Medical History:  Diagnosis Date  . Anxiety   . Breast cancer ([place])    ER+/PR+/Her2-  . Concussion 05/26/14  . Depression   . Frequent UTI   . GERD (gastroesophageal reflux disease)   . Hypothyroid   . Microscopic hematuria   . Migraine   . Personal history of radiation therapy   . Radiation 03/22/14-04/19/14   Breast Cancer upper-outer  . Restless leg syndrome   . Seasonal allergies   . Stented coronary artery    LAD 95% 2007.  . Vitamin D deficiency     SURGICAL HISTORY: Past Surgical History:  Procedure Laterality Date  . ABDOMINAL HYSTERECTOMY    . APPENDECTOMY    . back surgery  1997   herniated disc repair  . BREAST LUMPECTOMY Right    2016  . CARDIAC CATHETERIZATION  2007   stent  . CHOLECYSTECTOMY OPEN    . CYSTOSCOPY     neg  . RADIOACTIVE SEED GUIDED PARTIAL MASTECTOMY WITH AXILLARY SENTINEL LYMPH NODE BIOPSY Right 02/14/2014   Procedure: RADIOACTIVE SEED GUIDED PARTIAL MASTECTOMY WITH AXILLARY SENTINEL LYMPH NODE BIOPSY;  Surgeon: Erroll Luna, MD;  Location: [place];  Service: General;  Laterality: Right;  . SHOULDER SURGERY      I have reviewed the social history and family history with the patient and they are unchanged from previous note.  ALLERGIES:  is allergic to iohexol.  MEDICATIONS:  Current Outpatient Medications  Medication Sig Dispense Refill  . aspirin 81 MG tablet Take 81 mg by mouth daily.    Marland Kitchen CINNAMON PO Take 1,000 mg by mouth daily.     . Coenzyme Q10 (CO Q-10) 200 MG CAPS Take 1 capsule by mouth daily.      . fish oil-omega-3 fatty acids 1000 MG capsule Take 1.2 g by mouth 2 (two) times daily.    . furosemide (LASIX) 20 MG tablet TAKE 1 TABLET (20 MG TOTAL) BY MOUTH DAILY. 90 tablet  3  . Ginkgo Biloba 120 MG CAPS Take 1 capsule by mouth daily.      Marland Kitchen KEFLEX 250 MG capsule     . levothyroxine (SYNTHROID, LEVOTHROID) 137 MCG tablet Take 137 mcg by mouth daily before breakfast.     . Magnesium 250 MG TABS Take 250 mg by mouth daily.    . Multiple Vitamin (MULTIVITAMIN) capsule Take 1 capsule by mouth daily.    . nitroGLYCERIN (NITROSTAT) 0.4 MG SL tablet Place 1 tablet (0.4 mg total) under the tongue every 5 (five) minutes as needed. Pain 25 tablet 3  . pantoprazole (PROTONIX) 40 MG tablet Take 1 tablet by mouth twice daily 180 tablet 0  . potassium chloride SA (K-DUR) 20 MEQ tablet TAKE 2 TABLETS BY MOUTH TWICE DAILY AND 1/2 (ONE-HALF) AT BEDTIME 405 tablet 0  . propranolol ER (INDERAL LA) 60 MG 24 hr capsule Take 1 capsule by mouth once daily at bedtime 90 capsule 3  . Riboflavin 400 MG TABS Take 400 mg by mouth daily.    Marland Kitchen  simvastatin (ZOCOR) 40 MG tablet TAKE 1 TABLET BY MOUTH AT BEDTIME. OFFICE VISIT NEEDED 45 tablet 0  . topiramate (TOPAMAX) 100 MG tablet Take 1 tablet (100 mg total) by mouth 2 (two) times daily. 180 tablet 3  . XIIDRA 5 % SOLN Apply 1 drop to eye 2 (two) times daily. 1 drop in each eye Twice daily    . famotidine (PEPCID) 20 MG tablet Take 2 tablets (40 mg total) by mouth 2 (two) times daily. 360 tablet 0  . metoprolol tartrate (LOPRESSOR) 25 MG tablet Take 0.5 tablets (12.5 mg total) by mouth every 4 (four) hours as needed (As needed for palpitations). 30 tablet 5   No current facility-administered medications for this visit.     PHYSICAL EXAMINATION: ECOG PERFORMANCE STATUS: 0 - Asymptomatic  Vitals:   09/10/18 1355  BP: 129/67  Pulse: (!) 58  Resp: 18  Temp: 97.8 F (36.6 C)  SpO2: 98%   Filed Weights   09/10/18 1355  Weight: 207 lb 9.6 oz (94.2 kg)    GENERAL:alert, no distress and comfortable SKIN: skin color, texture, turgor are normal, no rashes or significant lesions EYES: normal, Conjunctiva are pink and non-injected, sclera  clear  NECK: supple, thyroid normal size, non-tender, without nodularity LYMPH:  no palpable lymphadenopathy in the cervical, axillary  LUNGS: clear to auscultation and percussion with normal breathing effort HEART: regular rate & rhythm and no murmurs and no lower extremity edema ABDOMEN:abdomen soft, non-tender and normal bowel sounds Musculoskeletal:no cyanosis of digits and no clubbing  NEURO: alert & oriented x 3 with fluent speech, no focal motor/sensory deficits BREAST: S/p right lumpectomy: Surgical incisions healed well (+) Likely benign blood vessel seen of left breast. No palpable mass, nodules or adenopathy bilaterally. Breast exam benign.   LABORATORY DATA:  I have reviewed the data as listed CBC Latest Ref Rng & Units 09/10/2018 02/15/2018 08/18/2017  WBC 4.0 - 10.5 K/uL 4.7 6.6 5.1  Hemoglobin 12.0 - 15.0 g/dL 13.0 14.0 13.0  Hematocrit 36.0 - 46.0 % 38.3 40.8 38.1  Platelets 150 - 400 K/uL 158 170 155     CMP Latest Ref Rng & Units 09/10/2018 02/15/2018 08/18/2017  Glucose 70 - 99 mg/dL 122(H) 129(H) 105(H)  BUN 8 - 23 mg/dL '15 23 15  ' Creatinine 0.44 - 1.00 mg/dL 1.51(H) 1.46(H) 1.34(H)  Sodium 135 - 145 mmol/L 141 144 143  Potassium 3.5 - 5.1 mmol/L 4.4 4.8 4.4  Chloride 98 - 111 mmol/L 111 113(H) 113(H)  CO2 22 - 32 mmol/L 20(L) 23 22  Calcium 8.9 - 10.3 mg/dL 9.6 10.0 10.0  Total Protein 6.5 - 8.1 g/dL 6.8 7.8 7.3  Total Bilirubin 0.3 - 1.2 mg/dL 1.4(H) 1.4(H) 1.1  Alkaline Phos 38 - 126 U/L 57 68 68  AST 15 - 41 U/L 33 28 20  ALT 0 - 44 U/L 44 34 22      RADIOGRAPHIC STUDIES: I have personally reviewed the radiological images as listed and agreed with the findings in the report. No results found.   ASSESSMENT & PLAN:  LILEIGH FAHRINGER is a 64 y.o. female with   1. pT1b N0 M0 stage IA invasive ductal carcinoma of right breast, grade II, ER/PR strongly positive, HER-2 negative, Oncotype RS 21 -She was diagnosed in 12/2013. She is status post right breast  lumpectomy, adjuvant breast radiation. She declined adjuvant antiestrogen therapy, due to the concern of side effects. -From Breast cancer standpoint she is clinically doing well. Lab reviewed, her  CBC and CMP WNL except BG 122, Cr 1.51, tbili 1.4. Vitamin D still pending. Her breast exam and her 11/2017 mammogram were unremarkable.  -Continue Surveillance.Next mammogram in 11/2018 -F/u in 6 months   2. Intermittent LUQ abdominal pain, nausea   -She is on Protonix and Pepcid BID for GERD -Her last Endoscopy and colonoscopy was  10 years ago.  -Her CT AP from 02/2018 was negative  -She notes her intermittent LUQ is less frequent and nausea mild and stable. She thinks it is related to her antacid medications and will f/u with her PCP about it.  -No abdominal tenderness on exam (09/10/18)  3. Osteopenia -last DEXA was in 06/2016, no high risk of fracture. -Continue Calcium and vitamin D supplements. -Next DEXA in 2020    4. Mild hyperbilirubinemia  -She has had mild intermittent elevated total bilirubin since 01/2015, liver enzymes are normal -total bili at 1.4 today (09/10/18), stable   5. CKD, Stage III -Cr increased to 1.51 today (09/10/18)  -Continue to follow up with her PCP and she may need to be seen by a nephrologist in the future.    Plan -She is clinically doing well -Mammogram and DEXA in 11/2018 -Lab and f/u in 6 months     No problem-specific Assessment & Plan notes found for this encounter.   Orders Placed This Encounter  Procedures  . DG Bone Density    Ins Pf: [id number] Epic order    Standing Status:   Future    Standing Expiration Date:   09/10/2019    Order Specific Question:   Reason for Exam (SYMPTOM  OR DIAGNOSIS REQUIRED)    Answer:   screening    Order Specific Question:   Preferred imaging location?    Answer:   [facility]  . MM DIAG BREAST TOMO BILATERAL    [employer] PF: 12/16/17'@BCG' /   Epic order    Standing Status:   Future    Standing  Expiration Date:   09/10/2019    Order Specific Question:   Reason for Exam (SYMPTOM  OR DIAGNOSIS REQUIRED)    Answer:   screening    Order Specific Question:   Preferred imaging location?    Answer:   [facility]   All questions were answered. The patient knows to call the clinic with any problems, questions or concerns. No barriers to learning was detected. I spent 15 minutes counseling the patient face to face. The total time spent in the appointment was 20 minutes and more than 50% was on counseling and review of test results     Truitt Merle, MD 09/10/2018   I, Joslyn Devon, am acting as scribe for Truitt Merle, MD.   I have reviewed the above documentation for accuracy and completeness, and I agree with the above.

## 2018-09-10 ENCOUNTER — Inpatient Hospital Stay: Payer: Medicare Other | Attending: Hematology

## 2018-09-10 ENCOUNTER — Inpatient Hospital Stay (HOSPITAL_BASED_OUTPATIENT_CLINIC_OR_DEPARTMENT_OTHER): Payer: Medicare Other | Admitting: Hematology

## 2018-09-10 ENCOUNTER — Other Ambulatory Visit: Payer: Self-pay

## 2018-09-10 ENCOUNTER — Telehealth: Payer: Self-pay | Admitting: Hematology

## 2018-09-10 VITALS — BP 129/67 | HR 58 | Temp 97.8°F | Resp 18 | Ht 68.0 in | Wt 207.6 lb

## 2018-09-10 DIAGNOSIS — Z923 Personal history of irradiation: Secondary | ICD-10-CM | POA: Insufficient documentation

## 2018-09-10 DIAGNOSIS — Z9049 Acquired absence of other specified parts of digestive tract: Secondary | ICD-10-CM | POA: Insufficient documentation

## 2018-09-10 DIAGNOSIS — E559 Vitamin D deficiency, unspecified: Secondary | ICD-10-CM

## 2018-09-10 DIAGNOSIS — C50411 Malignant neoplasm of upper-outer quadrant of right female breast: Secondary | ICD-10-CM

## 2018-09-10 DIAGNOSIS — Z170 Estrogen receptor positive status [ER+]: Secondary | ICD-10-CM

## 2018-09-10 DIAGNOSIS — M8580 Other specified disorders of bone density and structure, unspecified site: Secondary | ICD-10-CM | POA: Diagnosis not present

## 2018-09-10 DIAGNOSIS — N183 Chronic kidney disease, stage 3 (moderate): Secondary | ICD-10-CM | POA: Insufficient documentation

## 2018-09-10 DIAGNOSIS — Z853 Personal history of malignant neoplasm of breast: Secondary | ICD-10-CM | POA: Diagnosis not present

## 2018-09-10 DIAGNOSIS — E2839 Other primary ovarian failure: Secondary | ICD-10-CM | POA: Diagnosis not present

## 2018-09-10 DIAGNOSIS — R1012 Left upper quadrant pain: Secondary | ICD-10-CM | POA: Insufficient documentation

## 2018-09-10 DIAGNOSIS — Z79899 Other long term (current) drug therapy: Secondary | ICD-10-CM | POA: Insufficient documentation

## 2018-09-10 DIAGNOSIS — R110 Nausea: Secondary | ICD-10-CM | POA: Insufficient documentation

## 2018-09-10 LAB — CBC WITH DIFFERENTIAL/PLATELET
Abs Immature Granulocytes: 0.01 10*3/uL (ref 0.00–0.07)
Basophils Absolute: 0 10*3/uL (ref 0.0–0.1)
Basophils Relative: 1 %
Eosinophils Absolute: 0.1 10*3/uL (ref 0.0–0.5)
Eosinophils Relative: 2 %
HCT: 38.3 % (ref 36.0–46.0)
Hemoglobin: 13 g/dL (ref 12.0–15.0)
Immature Granulocytes: 0 %
Lymphocytes Relative: 32 %
Lymphs Abs: 1.5 10*3/uL (ref 0.7–4.0)
MCH: 30.7 pg (ref 26.0–34.0)
MCHC: 33.9 g/dL (ref 30.0–36.0)
MCV: 90.3 fL (ref 80.0–100.0)
Monocytes Absolute: 0.4 10*3/uL (ref 0.1–1.0)
Monocytes Relative: 9 %
Neutro Abs: 2.7 10*3/uL (ref 1.7–7.7)
Neutrophils Relative %: 56 %
Platelets: 158 10*3/uL (ref 150–400)
RBC: 4.24 MIL/uL (ref 3.87–5.11)
RDW: 13.7 % (ref 11.5–15.5)
WBC: 4.7 10*3/uL (ref 4.0–10.5)
nRBC: 0 % (ref 0.0–0.2)

## 2018-09-10 LAB — COMPREHENSIVE METABOLIC PANEL
ALT: 44 U/L (ref 0–44)
AST: 33 U/L (ref 15–41)
Albumin: 4 g/dL (ref 3.5–5.0)
Alkaline Phosphatase: 57 U/L (ref 38–126)
Anion gap: 10 (ref 5–15)
BUN: 15 mg/dL (ref 8–23)
CO2: 20 mmol/L — ABNORMAL LOW (ref 22–32)
Calcium: 9.6 mg/dL (ref 8.9–10.3)
Chloride: 111 mmol/L (ref 98–111)
Creatinine, Ser: 1.51 mg/dL — ABNORMAL HIGH (ref 0.44–1.00)
GFR calc Af Amer: 42 mL/min — ABNORMAL LOW (ref >60)
GFR calc non Af Amer: 36 mL/min — ABNORMAL LOW (ref >60)
Glucose, Bld: 122 mg/dL — ABNORMAL HIGH (ref 70–99)
Potassium: 4.4 mmol/L (ref 3.5–5.1)
Sodium: 141 mmol/L (ref 135–145)
Total Bilirubin: 1.4 mg/dL — ABNORMAL HIGH (ref 0.3–1.2)
Total Protein: 6.8 g/dL (ref 6.5–8.1)

## 2018-09-10 NOTE — Telephone Encounter (Signed)
Scheduled appt per 8/14 los,  Spoke with patient and they are aware of the appt date and time.

## 2018-09-11 ENCOUNTER — Encounter: Payer: Self-pay | Admitting: Hematology

## 2018-09-11 LAB — VITAMIN D 25 HYDROXY (VIT D DEFICIENCY, FRACTURES): Vit D, 25-Hydroxy: 38.9 ng/mL (ref 30.0–100.0)

## 2018-09-15 ENCOUNTER — Encounter: Payer: Self-pay | Admitting: Family Medicine

## 2018-09-16 ENCOUNTER — Other Ambulatory Visit: Payer: Self-pay | Admitting: Family Medicine

## 2018-09-30 ENCOUNTER — Encounter: Payer: Self-pay | Admitting: Internal Medicine

## 2018-10-15 ENCOUNTER — Other Ambulatory Visit: Payer: Self-pay | Admitting: Family Medicine

## 2018-10-25 NOTE — Progress Notes (Signed)
HPI The patient presents for followup of her known coronary disease.  In 2017 she had a Lexiscan Myoview prior to shoulder surgery.  This was low risk.     The patient returns for follow-up.  Is been about 18 months.  She has not been feeling well.  She is having discomfort under her left breast.  It goes into the center of her chest.  Sometimes to her left shoulder.  Sometimes into her back.  He can be associated with shortness of breath.  Is moderate in intensity.  It might last minutes to hours.  It goes away spontaneously.  It happens without provocation.  He does not notice that if he is walking steps.  She cannot tell whether this was like her previous angina.  She had a breast evaluation and there was no acute finding there.  He does do some walking.  She has occasional sporadic palpitations and takes metoprolol occasionally.  This seems to work for her.  Allergies  Allergen Reactions  . Iohexol      Desc: throat selling resp distress hives.premedicate pt.entered 07/06/04, bsw.     Current Outpatient Medications  Medication Sig Dispense Refill  . aspirin 81 MG tablet Take 81 mg by mouth daily.    Marland Kitchen CINNAMON PO Take 1,000 mg by mouth daily.     . Coenzyme Q10 (CO Q-10) 200 MG CAPS Take 1 capsule by mouth daily.      . fish oil-omega-3 fatty acids 1000 MG capsule Take 1.2 g by mouth 2 (two) times daily.    . furosemide (LASIX) 20 MG tablet TAKE 1 TABLET (20 MG TOTAL) BY MOUTH DAILY. 90 tablet 3  . Ginkgo Biloba 120 MG CAPS Take 1 capsule by mouth daily.      Marland Kitchen KEFLEX 250 MG capsule     . levothyroxine (SYNTHROID, LEVOTHROID) 137 MCG tablet Take 137 mcg by mouth daily before breakfast.     . Magnesium 250 MG TABS Take 250 mg by mouth daily.    . Multiple Vitamin (MULTIVITAMIN) capsule Take 1 capsule by mouth daily.    . nitroGLYCERIN (NITROSTAT) 0.4 MG SL tablet Place 1 tablet (0.4 mg total) under the tongue every 5 (five) minutes as needed. Pain 25 tablet 3  . pantoprazole  (PROTONIX) 40 MG tablet Take 1 tablet by mouth twice daily 180 tablet 0  . potassium chloride SA (K-DUR) 20 MEQ tablet TAKE 2 TABLETS BY MOUTH TWICE DAILY AND TAKE 1/2 TABLET BY MOUTH AT BEDTIME. 405 tablet 0  . propranolol ER (INDERAL LA) 60 MG 24 hr capsule Take 1 capsule by mouth once daily at bedtime 90 capsule 3  . Riboflavin 400 MG TABS Take 400 mg by mouth daily.    . simvastatin (ZOCOR) 40 MG tablet TAKE 1 TABLET BY MOUTH AT BEDTIME. OFFICE VISIT NEEDED 45 tablet 0  . topiramate (TOPAMAX) 100 MG tablet Take 1 tablet (100 mg total) by mouth 2 (two) times daily. 180 tablet 3  . XIIDRA 5 % SOLN Apply 1 drop to eye 2 (two) times daily. 1 drop in each eye Twice daily    . famotidine (PEPCID) 20 MG tablet Take 2 tablets (40 mg total) by mouth 2 (two) times daily. 360 tablet 0  . metoprolol tartrate (LOPRESSOR) 25 MG tablet Take 0.5 tablets (12.5 mg total) by mouth every 4 (four) hours as needed (As needed for palpitations). 30 tablet 5   No current facility-administered medications for this visit.  Past Medical History:  Diagnosis Date  . Anxiety   . Breast cancer ([place])    ER+/PR+/Her2-  . Concussion 05/26/14  . Depression   . Frequent UTI   . GERD (gastroesophageal reflux disease)   . Hypothyroid   . Microscopic hematuria   . Migraine   . Personal history of radiation therapy   . Radiation 03/22/14-04/19/14   Breast Cancer upper-outer  . Restless leg syndrome   . Seasonal allergies   . Stented coronary artery    LAD 95% 2007.  . Vitamin D deficiency     Past Surgical History:  Procedure Laterality Date  . ABDOMINAL HYSTERECTOMY    . APPENDECTOMY    . back surgery  1997   herniated disc repair  . BREAST LUMPECTOMY Right    2016  . CARDIAC CATHETERIZATION  2007   stent  . CHOLECYSTECTOMY OPEN    . CYSTOSCOPY     neg  . RADIOACTIVE SEED GUIDED PARTIAL MASTECTOMY WITH AXILLARY SENTINEL LYMPH NODE BIOPSY Right 02/14/2014   Procedure: RADIOACTIVE SEED GUIDED PARTIAL  MASTECTOMY WITH AXILLARY SENTINEL LYMPH NODE BIOPSY;  Surgeon: Erroll Luna, MD;  Location: [facility];  Service: General;  Laterality: Right;  . SHOULDER SURGERY      ROS:   As stated in the HPI and negative for all other systems.  PHYSICAL EXAM BP 124/82   Pulse 61   Ht _0  (1.727 m)   Wt 209 lb (94.8 kg)   BMI 31.78 kg/m   GENERAL:  Well appearing NECK:  No jugular venous distention, waveform within normal limits, carotid upstroke brisk and symmetric, no bruits, no thyromegaly LUNGS:  Clear to auscultation bilaterally CHEST:  Unremarkable HEART:  PMI not displaced or sustained,S1 and S2 within normal limits, no S3, no S4, no clicks, no rubs, no murmurs ABD:  Flat, positive bowel sounds normal in frequency in pitch, no bruits, no rebound, no guarding, no midline pulsatile mass, no hepatomegaly, no splenomegaly EXT:  2 plus pulses throughout, no edema, no cyanosis no clubbing   EKG: Sinus rhythm, rate 69, axis within normal limits, intervals within normal limits, no acute ST-T wave changes.   Lab Results  Component Value Date   CHOL 150 06/26/2017   TRIG 188 (H) 06/26/2017   HDL 48 (L) 06/26/2017   LDLCALC 74 06/26/2017     ASSESSMENT AND PLAN   Palpitations - She has some occasional palpitations that are well treated with metoprolol pill in pocket approach.  No change in therapy or further evaluation.   CHEST PAIN/CAD - Given her previous coronary artery disease and this discomfort she needs stress testing but would not be able walk on a treadmill.  Therefore, she will have a [employer].  DYSLIPIDEMIA -  Her LDL last year was fine but I check this again this year when she returns fasting.  Check a lipid profile liver enzymes.

## 2018-10-26 ENCOUNTER — Other Ambulatory Visit: Payer: Self-pay

## 2018-10-26 ENCOUNTER — Ambulatory Visit: Payer: Medicare Other | Admitting: Cardiology

## 2018-10-26 ENCOUNTER — Other Ambulatory Visit: Payer: Self-pay | Admitting: Family Medicine

## 2018-10-26 ENCOUNTER — Ambulatory Visit (INDEPENDENT_AMBULATORY_CARE_PROVIDER_SITE_OTHER): Payer: Medicare Other | Admitting: Cardiology

## 2018-10-26 ENCOUNTER — Encounter: Payer: Self-pay | Admitting: Cardiology

## 2018-10-26 VITALS — BP 124/82 | HR 61 | Ht 68.0 in | Wt 209.0 lb

## 2018-10-26 DIAGNOSIS — E785 Hyperlipidemia, unspecified: Secondary | ICD-10-CM

## 2018-10-26 DIAGNOSIS — I2510 Atherosclerotic heart disease of native coronary artery without angina pectoris: Secondary | ICD-10-CM | POA: Diagnosis not present

## 2018-10-26 DIAGNOSIS — R002 Palpitations: Secondary | ICD-10-CM

## 2018-10-26 NOTE — Patient Instructions (Signed)
Medication Instructions:  Your physician recommends that you continue on your current medications as directed. Please refer to the Current Medication list given to you today.  If you need a refill on your cardiac medications before your next appointment, please call your pharmacy.   Lab work: Fasting lipids and Hepatic Function If you have labs (blood work) drawn today and your tests are completely normal, you will receive your results only by: [place] (if you have MyChart) OR A paper copy in the mail If you have any lab test that is abnormal or we need to change your treatment, we will call you to review the results.  Testing/Procedures: Your physician has requested that you have a lexiscan myoview. For further information please visit [email or web address]. Please follow instruction sheet, as given.    Follow-Up: At [facility], you and your health needs are our priority.  As part of our continuing mission to provide you with exceptional heart care, we have created designated Provider Care Teams.  These Care Teams include your primary Cardiologist (physician) and Advanced Practice Providers (APPs -  Physician Assistants and Nurse Practitioners) who all work together to provide you with the care you need, when you need it. You will need a follow up appointment in 12 months.  Please call our office 2 months in advance to schedule this appointment.  You may see Dr. Percival Spanish or one of the following Advanced Practice Providers on your designated Care Team:   Rosaria Ferries, PA-C Jory Sims, DNP, ANP  Any Other Special Instructions Will Be Listed Below (If Applicable). Come back for fasting labs. No appointment needed.

## 2018-10-27 ENCOUNTER — Telehealth (HOSPITAL_COMMUNITY): Payer: Self-pay | Admitting: *Deleted

## 2018-10-27 ENCOUNTER — Telehealth (HOSPITAL_COMMUNITY): Payer: Self-pay

## 2018-10-27 MED ORDER — FAMOTIDINE 20 MG PO TABS: 40.0000 mg | ORAL_TABLET | Freq: Two times a day (BID) | ORAL | 0 refills | Status: DC

## 2018-10-27 NOTE — Telephone Encounter (Signed)
Close encounter 

## 2018-10-27 NOTE — Telephone Encounter (Signed)
Encounter complete. 

## 2018-10-29 ENCOUNTER — Other Ambulatory Visit: Payer: Self-pay

## 2018-10-29 ENCOUNTER — Ambulatory Visit (HOSPITAL_COMMUNITY)
Admission: RE | Admit: 2018-10-29 | Discharge: 2018-10-29 | Disposition: A | Discharge status: Home / Self Care | Payer: Medicare Other | Source: Ambulatory Visit | Attending: Cardiology | Admitting: Cardiology

## 2018-10-29 DIAGNOSIS — R001 Bradycardia, unspecified: Secondary | ICD-10-CM | POA: Diagnosis not present

## 2018-10-29 DIAGNOSIS — Z955 Presence of coronary angioplasty implant and graft: Secondary | ICD-10-CM | POA: Diagnosis not present

## 2018-10-29 DIAGNOSIS — I2510 Atherosclerotic heart disease of native coronary artery without angina pectoris: Secondary | ICD-10-CM | POA: Diagnosis not present

## 2018-10-29 DIAGNOSIS — R079 Chest pain, unspecified: Secondary | ICD-10-CM | POA: Diagnosis not present

## 2018-10-29 DIAGNOSIS — E039 Hypothyroidism, unspecified: Secondary | ICD-10-CM | POA: Diagnosis not present

## 2018-10-29 DIAGNOSIS — E785 Hyperlipidemia, unspecified: Secondary | ICD-10-CM | POA: Diagnosis not present

## 2018-10-29 DIAGNOSIS — R002 Palpitations: Secondary | ICD-10-CM

## 2018-10-29 LAB — MYOCARDIAL PERFUSION IMAGING
LV dias vol: 73 mL (ref 46–106)
LV sys vol: 29 mL
Peak HR: 82 {beats}/min
Rest HR: 55 {beats}/min
SDS: 2
SRS: 0
SSS: 2
TID: 1.26

## 2018-10-29 MED ORDER — TECHNETIUM TC 99M TETROFOSMIN IV KIT
10.3000 | PACK | Freq: Once | INTRAVENOUS | Status: AC | PRN
  Administered 2018-10-29: 10.3 via INTRAVENOUS
  Filled 2018-10-29: qty 11

## 2018-10-29 MED ORDER — REGADENOSON 0.4 MG/5ML IV SOLN
0.4000 mg | Freq: Once | INTRAVENOUS | Status: AC
  Administered 2018-10-29: 0.4 mg via INTRAVENOUS

## 2018-10-29 MED ORDER — TECHNETIUM TC 99M TETROFOSMIN IV KIT
29.0000 | PACK | Freq: Once | INTRAVENOUS | Status: AC | PRN
  Administered 2018-10-29: 29 via INTRAVENOUS
  Filled 2018-10-29: qty 29

## 2018-10-30 ENCOUNTER — Other Ambulatory Visit: Payer: Self-pay | Admitting: Cardiology

## 2018-10-30 LAB — HEPATIC FUNCTION PANEL
ALT: 42 IU/L — ABNORMAL HIGH (ref 0–32)
AST: 41 IU/L — ABNORMAL HIGH (ref 0–40)
Albumin: 4.7 g/dL (ref 3.8–4.8)
Alkaline Phosphatase: 67 IU/L (ref 39–117)
Bilirubin Total: 1.4 mg/dL — ABNORMAL HIGH (ref 0.0–1.2)
Bilirubin, Direct: 0.28 mg/dL (ref 0.00–0.40)
Total Protein: 7 g/dL (ref 6.0–8.5)

## 2018-10-30 LAB — LIPID PANEL
Chol/HDL Ratio: 3.3 ratio (ref 0.0–4.4)
Cholesterol, Total: 167 mg/dL (ref 100–199)
HDL: 50 mg/dL (ref >39)
LDL Chol Calc (NIH): 89 mg/dL (ref 0–99)
Triglycerides: 164 mg/dL — ABNORMAL HIGH (ref 0–149)
VLDL Cholesterol Cal: 28 mg/dL (ref 5–40)

## 2018-10-31 ENCOUNTER — Other Ambulatory Visit: Payer: Self-pay

## 2018-11-01 MED ORDER — SIMVASTATIN 40 MG PO TABS: 40.0000 mg | ORAL_TABLET | Freq: Every day | ORAL | 3 refills | Status: DC

## 2018-11-01 NOTE — Telephone Encounter (Signed)
Rx(s) sent to pharmacy electronically.  

## 2018-11-11 ENCOUNTER — Other Ambulatory Visit: Payer: Self-pay

## 2018-11-11 ENCOUNTER — Encounter: Payer: Self-pay | Admitting: Family Medicine

## 2018-11-11 ENCOUNTER — Ambulatory Visit: Payer: Medicare Other

## 2018-11-11 ENCOUNTER — Ambulatory Visit (INDEPENDENT_AMBULATORY_CARE_PROVIDER_SITE_OTHER): Payer: Medicare Other | Admitting: Family Medicine

## 2018-11-11 VITALS — BP 118/76 | HR 70 | Temp 97.6°F | Ht 67.0 in | Wt 205.1 lb

## 2018-11-11 DIAGNOSIS — G43719 Chronic migraine without aura, intractable, without status migrainosus: Secondary | ICD-10-CM

## 2018-11-11 DIAGNOSIS — M85851 Other specified disorders of bone density and structure, right thigh: Secondary | ICD-10-CM

## 2018-11-11 DIAGNOSIS — Z0000 Encounter for general adult medical examination without abnormal findings: Secondary | ICD-10-CM

## 2018-11-11 DIAGNOSIS — E559 Vitamin D deficiency, unspecified: Secondary | ICD-10-CM

## 2018-11-11 DIAGNOSIS — E039 Hypothyroidism, unspecified: Secondary | ICD-10-CM

## 2018-11-11 DIAGNOSIS — E2839 Other primary ovarian failure: Secondary | ICD-10-CM

## 2018-11-11 DIAGNOSIS — R7309 Other abnormal glucose: Secondary | ICD-10-CM

## 2018-11-11 DIAGNOSIS — K219 Gastro-esophageal reflux disease without esophagitis: Secondary | ICD-10-CM

## 2018-11-11 DIAGNOSIS — H9193 Unspecified hearing loss, bilateral: Secondary | ICD-10-CM

## 2018-11-11 DIAGNOSIS — C50411 Malignant neoplasm of upper-outer quadrant of right female breast: Secondary | ICD-10-CM

## 2018-11-11 DIAGNOSIS — E66811 Obesity, class 1: Secondary | ICD-10-CM

## 2018-11-11 DIAGNOSIS — N289 Disorder of kidney and ureter, unspecified: Secondary | ICD-10-CM | POA: Diagnosis not present

## 2018-11-11 DIAGNOSIS — E7800 Pure hypercholesterolemia, unspecified: Secondary | ICD-10-CM

## 2018-11-11 DIAGNOSIS — H9190 Unspecified hearing loss, unspecified ear: Secondary | ICD-10-CM | POA: Insufficient documentation

## 2018-11-11 DIAGNOSIS — N390 Urinary tract infection, site not specified: Secondary | ICD-10-CM

## 2018-11-11 DIAGNOSIS — E6609 Other obesity due to excess calories: Secondary | ICD-10-CM

## 2018-11-11 DIAGNOSIS — Z6832 Body mass index (BMI) 32.0-32.9, adult: Secondary | ICD-10-CM

## 2018-11-11 DIAGNOSIS — Z23 Encounter for immunization: Secondary | ICD-10-CM | POA: Diagnosis not present

## 2018-11-11 DIAGNOSIS — Z170 Estrogen receptor positive status [ER+]: Secondary | ICD-10-CM

## 2018-11-11 MED ORDER — FUROSEMIDE 20 MG PO TABS: ORAL_TABLET | ORAL | 3 refills | Status: DC

## 2018-11-11 NOTE — Assessment & Plan Note (Signed)
Reviewed health habits including diet and exercise and skin cancer prevention Reviewed appropriate screening tests for age  Also reviewed health mt list, fam hx and immunization status , as well as social and family history   See HPI  Labs reviewed  Enc pt to schedule her colonoscopy (she may also need EGD) Mammogram planned next mo (personal hx of breast cancer)  Due for tetanus shot - she will check on coverage/pricing at phamacy Flu vaccine given today  Disc shingrix vaccine-she will check on coverage  dexa ordered (taking vit D and walking) -no falls or fx Failed hearing screen- audiology referral done Reassuring vision screen

## 2018-11-11 NOTE — Progress Notes (Signed)
Subjective:    Patient ID: [name] [name], female    DOB: 09/06/1955, 63 y.o.   MRN: [id number]  HPI Pt presents for amw and health mt exam with review of chronic medical problems  I have personally reviewed the Medicare Annual Wellness questionnaire and have noted 1. The patient's medical and social history 2. Their use of alcohol, tobacco or illicit drugs 3. Their current medications and supplements 4. The patient's functional ability including ADL's, fall risks, home safety risks and hearing or visual             impairment. 5. Diet and physical activities 6. Evidence for depression or mood disorders  The patients weight, height, BMI have been recorded in the chart and visual acuity is per eye clinic.  I have made referrals, counseling and provided education to the patient based review of the above and I have provided the pt with a written personalized care plan for preventive services. Reviewed and updated provider list, see scanned forms.  See scanned forms.  Routine anticipatory guidance given to patient.  See health maintenance. Colon cancer screening  Colonoscopy 9/10 - Dr Carlean Purl contacted her about scheduling one  She will discuss with him and oncologist  She did a home kit for [employer]  Breast cancer screening mammogram 11/19 - personal h/o breast ca  H/o lumpectomy and radiation  Self breast exam-nl  Had recent exam that was normal (Dr Burr Medico)  Flu vaccine - today  Tetanus vaccine 1/08 Td - will check with pharmacy or health dept  Pneumovax will start at 15  Zoster vaccine-interested  Dexa 6/18 -osteopenia  In setting of thyroid replacement  Falls-none Fractures-none  Supplements- takes vitamin D  Vit D level was 38.9 in august  Exercise - has a treadmill and she walks   Advance directive- does not have one /given materials  Cognitive function addressed- see scanned forms- and if abnormal then additional documentation follows.  She notes slower memory  around the time of a migraine  It takes some time to recover   She takes topamax for migraines  She will have to f/u with neurology early   PMH and [place] reviewed  Meds, vitals, and allergies reviewed.   ROS: See HPI.  Otherwise negative.    Doing ok overall  Able to stay safe with the pandemic Stays home most of the time /being careful  She has cats that have died (she thinks of covid) - very hard on her   Weight : Wt Readings from Last 3 Encounters:  11/11/18 205 lb 2 oz (93 kg)  10/29/18 209 lb (94.8 kg)  10/26/18 209 lb (94.8 kg)  down 4-5 lb   Using treadmill  Her diet varies (bad lately)  32.13 kg/m   Hearing/vision:  Hearing Screening   _0  _1  _2  _3  _4  _5  _6  _7  _8   Right ear:   0 0 0  0    Left ear:   0 0 0  0      Visual Acuity Screening   Right eye Left eye Both eyes  Without correction: _9  With correction:      Hearing is "terrible"  occ wax Would like further eval  Hypothyroidism  Pt has no clinical changes No change in energy level/ hair or skin/ edema and no tremor Sees endocrinology -Dr Chalmers Cater (sees regularly)  It has been up and down - she struggles with it  Changes her dose every other day  Renal insufficiency Lab Results  Component Value Date   CREATININE 1.51 (H) 09/10/2018   BUN 15 09/10/2018   NA 141 09/10/2018   K 4.4 09/10/2018   CL 111 09/10/2018   CO2 20 (L) 09/10/2018  drinks water all the time (and she does not drink other things)  H/o frequent utis in the past  Still has them and sees urology  Takes chronic keflex   Continues chronic LUQ abd pain  Acid meds help but do not cure     H/o breast cancer of R breast  Oncology f/u-recently  Stable   Hyperlipidemia (with hx of CAD) Lab Results  Component Value Date   CHOL 167 10/29/2018   CHOL 150 06/26/2017   CHOL 143 06/04/2016   Lab Results  Component Value Date   HDL 50 10/29/2018   HDL 48 (L) 06/26/2017   HDL 50.30  06/04/2016   Lab Results  Component Value Date   LDLCALC 89 10/29/2018   LDLCALC 74 06/26/2017   LDLCALC 68 06/04/2016   Lab Results  Component Value Date   TRIG 164 (H) 10/29/2018   TRIG 188 (H) 06/26/2017   TRIG 121.0 06/04/2016   Lab Results  Component Value Date   CHOLHDL 3.3 10/29/2018   CHOLHDL 3.1 06/26/2017   CHOLHDL 3 06/04/2016   No results found for: LDLDIRECT Recently saw cardiology  Stress test- stable  Takes simvastatin  She eats what she can afford - avoids fried foods  Cannot afford a lot of fresh produce   BP Readings from Last 3 Encounters:  11/11/18 118/76  10/26/18 124/82  09/10/18 129/67     Pulse Readings from Last 3 Encounters:  11/11/18 70  10/26/18 61  09/10/18 (!) 58     Prediabetes Lab Results  Component Value Date   HGBA1C 5.7 (H) 06/26/2017  not particularly watching diet   Patient Active Problem List   Diagnosis Date Noted  . Medicare annual wellness visit, subsequent 11/11/2018  . Hearing loss 11/11/2018  . Palpitation 04/21/2017  . Coronary artery disease involving native coronary artery of native heart without angina pectoris 04/21/2017  . Positive ANA (antinuclear antibody) 12/15/2016  . Muscle cramps 12/11/2016  . Fatigue 12/11/2016  . Nausea 11/28/2016  . Estrogen deficiency 06/24/2016  . Osteopenia 05/18/2016  . Genetic testing 01/25/2014  . Breast cancer of upper-outer quadrant of right female breast ([place]) 12/30/2013  . Hypothyroid 12/10/2011  . Adverse effects of medication 10/28/2010  . Routine general medical examination at a health care facility 09/30/2010  . PERIODIC LIMB MOVEMENT DISORDER 08/16/2008  . HYPERSOMNIA 06/21/2008  . Somnolence, daytime 06/07/2008  . Vitamin D deficiency 04/07/2008  . Chronic UTI 11/05/2007  . Renal insufficiency 09/16/2007  . Other abnormal glucose 09/16/2007  . HEMATURIA, MICROSCOPIC, HX OF 09/16/2007  . OTHER ABNORMAL BLOOD CHEMISTRY 09/09/2007  . Hyperlipidemia  08/05/2007  . Obesity 08/05/2007  . DEPRESSIVE DISORDER 08/05/2007  . Intractable chronic migraine without aura 08/05/2007  . Coronary atherosclerosis 08/05/2007  . GERD 08/05/2007  . DEGENERATIVE DISC DISEASE, LUMBAR SPINE 08/05/2007  . EDEMA 08/05/2007   Past Medical History:  Diagnosis Date  . Anxiety   . Breast cancer ([place])    ER+/PR+/Her2-  . Concussion 05/26/14  . Depression   . Frequent UTI   . GERD (gastroesophageal reflux disease)   . Hypothyroid   . Microscopic hematuria   . Migraine   . Personal history of radiation therapy   . Radiation 03/22/14-04/19/14   Breast Cancer upper-outer  .  Restless leg syndrome   . Seasonal allergies   . Stented coronary artery    LAD 95% 2007.  . Vitamin D deficiency    Past Surgical History:  Procedure Laterality Date  . ABDOMINAL HYSTERECTOMY    . APPENDECTOMY    . back surgery  1997   herniated disc repair  . BREAST LUMPECTOMY Right    2016  . CARDIAC CATHETERIZATION  2007   stent  . CHOLECYSTECTOMY OPEN    . CYSTOSCOPY     neg  . RADIOACTIVE SEED GUIDED PARTIAL MASTECTOMY WITH AXILLARY SENTINEL LYMPH NODE BIOPSY Right 02/14/2014   Procedure: RADIOACTIVE SEED GUIDED PARTIAL MASTECTOMY WITH AXILLARY SENTINEL LYMPH NODE BIOPSY;  Surgeon: Erroll Luna, MD;  Location: [facility];  Service: General;  Laterality: Right;  . SHOULDER SURGERY     Social History   Tobacco Use  . Smoking status: Never Smoker  . Smokeless tobacco: Never Used  Substance Use Topics  . Alcohol use: No    Alcohol/week: 0.0 standard drinks    Comment: socially  . Drug use: No   Family History  Problem Relation Age of Onset  . Coronary artery disease Mother   . Thyroid disease Mother   . Alzheimer's disease Mother   . Diabetes Mother   . Heart disease Mother   . Cancer Brother        melnoma  . Ovarian cancer Paternal Aunt 85  . Bone cancer Paternal Uncle   . Alzheimer's disease Maternal Grandmother   . Heart disease  Maternal Grandfather   . Pancreatic cancer Maternal Grandfather 35  . Ovarian cancer Maternal Aunt        dx in 8s  . Throat cancer Maternal Uncle        smoker  . Lung cancer Maternal Uncle        heavy smoker  . Ovarian cancer Paternal Aunt        dx in her 22s  . Cancer Cousin        "female cancer"  . Cancer Father    Allergies  Allergen Reactions  . Iohexol      Desc: throat selling resp distress hives.premedicate pt.entered 07/06/04, bsw.    Current Outpatient Medications on File Prior to Visit  Medication Sig Dispense Refill  . aspirin 81 MG tablet Take 81 mg by mouth daily.    Marland Kitchen CINNAMON PO Take 1,000 mg by mouth daily.     . Coenzyme Q10 (CO Q-10) 200 MG CAPS Take 1 capsule by mouth daily.      . famotidine (PEPCID) 20 MG tablet Take 2 tablets (40 mg total) by mouth 2 (two) times daily. 360 tablet 0  . fish oil-omega-3 fatty acids 1000 MG capsule Take 1.2 g by mouth 2 (two) times daily.    . Ginkgo Biloba 120 MG CAPS Take 1 capsule by mouth daily.      Marland Kitchen KEFLEX 250 MG capsule     . levothyroxine (SYNTHROID) 125 MCG tablet TAKE ONE TABLET BY MOUTH IN THE MORNING ON AN EMPTY STOMACH SAT SUN.    Marland Kitchen levothyroxine (SYNTHROID, LEVOTHROID) 137 MCG tablet Take 137 mcg by mouth daily before breakfast.     . Magnesium 250 MG TABS Take 250 mg by mouth daily.    . Multiple Vitamin (MULTIVITAMIN) capsule Take 1 capsule by mouth daily.    . nitroGLYCERIN (NITROSTAT) 0.4 MG SL tablet Place 1 tablet (0.4 mg total) under the tongue every 5 (five) minutes as  needed. Pain 25 tablet 3  . pantoprazole (PROTONIX) 40 MG tablet Take 1 tablet by mouth twice daily 180 tablet 0  . potassium chloride SA (K-DUR) 20 MEQ tablet TAKE 2 TABLETS BY MOUTH TWICE DAILY AND TAKE 1/2 TABLET BY MOUTH AT BEDTIME. 405 tablet 0  . propranolol ER (INDERAL LA) 60 MG 24 hr capsule Take 1 capsule by mouth once daily at bedtime 90 capsule 3  . Riboflavin 400 MG TABS Take 400 mg by mouth daily.    . simvastatin (ZOCOR)  40 MG tablet Take 1 tablet (40 mg total) by mouth at bedtime. 90 tablet 3  . topiramate (TOPAMAX) 100 MG tablet Take 1 tablet (100 mg total) by mouth 2 (two) times daily. 180 tablet 3  . XIIDRA 5 % SOLN Apply 1 drop to eye 2 (two) times daily. 1 drop in each eye Twice daily    . metoprolol tartrate (LOPRESSOR) 25 MG tablet Take 0.5 tablets (12.5 mg total) by mouth every 4 (four) hours as needed (As needed for palpitations). 30 tablet 5   No current facility-administered medications on file prior to visit.     Review of Systems  Constitutional: Positive for fatigue. Negative for activity change, appetite change, fever and unexpected weight change.  HENT: Negative for congestion, ear pain, rhinorrhea, sinus pressure and sore throat.   Eyes: Negative for pain, redness and visual disturbance.  Respiratory: Negative for cough, shortness of breath and wheezing.   Cardiovascular: Negative for chest pain and palpitations.  Gastrointestinal: Negative for abdominal pain, blood in stool, constipation and diarrhea.  Endocrine: Negative for polydipsia and polyuria.  Genitourinary: Negative for dysuria, frequency and urgency.  Musculoskeletal: Positive for arthralgias. Negative for back pain and myalgias.  Skin: Negative for pallor and rash.  Allergic/Immunologic: Negative for environmental allergies.  Neurological: Positive for headaches. Negative for dizziness and syncope.  Hematological: Negative for adenopathy. Does not bruise/bleed easily.  Psychiatric/Behavioral: Positive for dysphoric mood. Negative for decreased concentration. The patient is not nervous/anxious.        Objective:   Physical Exam Constitutional:      General: She is not in acute distress.    Appearance: Normal appearance. She is well-developed. She is obese. She is not ill-appearing or diaphoretic.  HENT:     Head: Normocephalic and atraumatic.     Right Ear: Tympanic membrane, ear canal and external ear normal.     Left  Ear: Tympanic membrane, ear canal and external ear normal.     Nose: Nose normal. No congestion.     Mouth/Throat:     Mouth: Mucous membranes are moist.     Pharynx: Oropharynx is clear. No posterior oropharyngeal erythema.  Eyes:     General: No scleral icterus.    Extraocular Movements: Extraocular movements intact.     Conjunctiva/sclera: Conjunctivae normal.     Pupils: Pupils are equal, round, and reactive to light.  Neck:     Musculoskeletal: Normal range of motion and neck supple. No neck rigidity or muscular tenderness.     Thyroid: No thyromegaly.     Vascular: No carotid bruit or JVD.  Cardiovascular:     Rate and Rhythm: Normal rate and regular rhythm.     Pulses: Normal pulses.     Heart sounds: Normal heart sounds. No gallop.   Pulmonary:     Effort: Pulmonary effort is normal. No respiratory distress.     Breath sounds: Normal breath sounds. No wheezing.     Comments: Good air  exch Chest:     Chest wall: No tenderness.  Abdominal:     General: Bowel sounds are normal. There is no distension or abdominal bruit.     Palpations: Abdomen is soft. There is no mass.     Tenderness: There is abdominal tenderness.     Hernia: No hernia is present.     Comments: Mild LUQ abdominal tenderness   Genitourinary:    Comments: Breast exam done recently by oncology  Musculoskeletal: Normal range of motion.        General: No tenderness.     Right lower leg: No edema.     Left lower leg: No edema.  Lymphadenopathy:     Cervical: No cervical adenopathy.  Skin:    General: Skin is warm and dry.     Coloration: Skin is not pale.     Findings: No erythema or rash.     Comments: Fair Solar lentigines diffusely   Neurological:     Mental Status: She is alert. Mental status is at baseline.     Cranial Nerves: No cranial nerve deficit.     Motor: No abnormal muscle tone.     Coordination: Coordination normal.     Gait: Gait normal.     Deep Tendon Reflexes: Reflexes are normal  and symmetric.  Psychiatric:        Mood and Affect: Mood normal.        Cognition and Memory: Cognition and memory normal.     Comments: Pleasant            Assessment & Plan:   Problem List Items Addressed This Visit      Cardiovascular and Mediastinum   Intractable chronic migraine without aura    Worse lately- taking topamax Pt plans to f/u with neurology early      Relevant Medications   furosemide (LASIX) 20 MG tablet     Digestive   GERD    Pt continues to have LUQ pain- ? Gastritis  ppi and H2 blocker help but do not resolve  Enc her to talk to GI about consideration of EGD with her colonoscopy         Endocrine   Hypothyroid    Hypothyroidism  Pt has no clinical changes No change in energy level/ hair or skin/ edema and no tremor She sees endocrinology for management of this        Relevant Medications   levothyroxine (SYNTHROID) 125 MCG tablet     Nervous and Auditory   Hearing loss    Pt failed hearing screen  No cerumen-nl exam Ref done to audiology for eval and consideration of hearing aid       Relevant Orders   Ambulatory referral to Audiology     Musculoskeletal and Integument   Osteopenia    Due for 2 y dexa- ref made Taking vit D No falls or fractures On thyroid replacement  Walking on treadmill -commended         Genitourinary   Renal insufficiency    Lab Results  Component Value Date   CREATININE 1.51 (H) 09/10/2018   This continues to increase despite good water intake and avoidance of nsaids  Ref made to nephrology      Relevant Orders   Ambulatory referral to Nephrology   Chronic UTI    Pt continues keflex and urology care  Cr continues to elevate Good water intake        Other   Vitamin D deficiency (  Chronic)    D level of 38 Vitamin D level is therapeutic with current supplementation Disc importance of this to bone and overall health       Hyperlipidemia    Disc goals for lipids and reasons to  control them Rev last labs with pt Rev low sat fat diet in detail Good HDL  LDL up to 89 (has CAD goal of 70)  Diet has not been as good  On max dose of simvastatin       Relevant Medications   furosemide (LASIX) 20 MG tablet   Obesity    Discussed how this problem influences overall health and the risks it imposes  Reviewed plan for weight loss with lower calorie diet (via better food choices and also portion control or program like weight watchers) and exercise building up to or more than 30 minutes 5 days per week including some aerobic activity   Enc her to keep walking on [employer] produce in diet - frozen or fresh       Other abnormal glucose    Continues in 120s  disc imp of low glycemic diet and wt loss to prevent DM2  Will do A1C next time       Routine general medical examination at a health care facility    Reviewed health habits including diet and exercise and skin cancer prevention Reviewed appropriate screening tests for age  Also reviewed health mt list, fam hx and immunization status , as well as social and family history   See HPI  Labs reviewed  Enc pt to schedule her colonoscopy (she may also need EGD) Mammogram planned next mo (personal hx of breast cancer)  Due for tetanus shot - she will check on coverage/pricing at [employer] Flu vaccine given today  Disc shingrix vaccine-she will check on coverage  dexa ordered (taking vit D and walking) -no falls or fx Failed hearing screen- audiology referral done Reassuring vision screen       Breast cancer of upper-outer quadrant of right female breast ([place])    Pt is doing well/no clinical changes Continues oncology f/u  Mammogram is planned and recent breast exam was normal Enc her to continue self exams      Estrogen deficiency   Relevant Orders   DG Bone Density   Medicare annual wellness visit, subsequent - Primary    Reviewed health habits including diet and exercise and skin cancer prevention  Reviewed appropriate screening tests for age  Also reviewed health mt list, fam hx and immunization status , as well as social and family history   See HPI  Labs reviewed  Enc pt to schedule her colonoscopy (she may also need EGD) Mammogram planned next mo (personal hx of breast cancer)  Due for tetanus shot - she will check on coverage/pricing at phamacy Flu vaccine given today  Disc shingrix vaccine-she will check on coverage  dexa ordered (taking vit D and walking) -no falls or fx Failed hearing screen- audiology referral done Reassuring vision screen          Other Visit Diagnoses    Need for influenza vaccination       Relevant Orders   Flu Vaccine QUAD 6+ mos PF IM (Fluarix Quad PF) (Completed)

## 2018-11-11 NOTE — Assessment & Plan Note (Signed)
Lab Results  Component Value Date   CREATININE 1.51 (H) 09/10/2018   This continues to increase despite good water intake and avoidance of nsaids  Ref made to nephrology

## 2018-11-11 NOTE — Assessment & Plan Note (Signed)
Pt continues to have LUQ pain- ? Gastritis  ppi and H2 blocker help but do not resolve  Enc her to talk to GI about consideration of EGD with her colonoscopy

## 2018-11-11 NOTE — Assessment & Plan Note (Signed)
Pt is doing well/no clinical changes Continues oncology f/u  Mammogram is planned and recent breast exam was normal Enc her to continue self exams

## 2018-11-11 NOTE — Assessment & Plan Note (Signed)
Pt continues keflex and urology care  Cr continues to elevate Good water intake

## 2018-11-11 NOTE — Assessment & Plan Note (Signed)
Hypothyroidism  Pt has no clinical changes No change in energy level/ hair or skin/ edema and no tremor She sees endocrinology for management of this

## 2018-11-11 NOTE — Assessment & Plan Note (Signed)
Disc goals for lipids and reasons to control them Rev last labs with pt Rev low sat fat diet in detail Good HDL  LDL up to 89 (has CAD goal of 70)  Diet has not been as good  On max dose of simvastatin

## 2018-11-11 NOTE — Assessment & Plan Note (Signed)
Pt failed hearing screen  No cerumen-nl exam Ref done to audiology for eval and consideration of hearing aid

## 2018-11-11 NOTE — Assessment & Plan Note (Signed)
Worse lately- taking topamax Pt plans to f/u with neurology early

## 2018-11-11 NOTE — Patient Instructions (Addendum)
Discuss a colonoscopy with your oncologist and GI doctor   Talk to Dr Carlean Purl about also doing an endoscopy to look in your stomach   Don't forget to schedule your mammogram   Call your insurance about coverage of a tetanus shot - it may be cheaper in a pharmacist  Most important after an injury   If you are interested in the new shingles vaccine (Shingrix) - call your local pharmacy to check on coverage and availability  If affordable, get on a wait list at your pharmacy to get the vaccine.   Take a look at the materials to work on a living will and power of attorney   For cholesterol Avoid red meat/ fried foods/ egg yolks/ fatty breakfast meats/ butter, cheese and high fat dairy/ and shellfish

## 2018-11-11 NOTE — Assessment & Plan Note (Signed)
Discussed how this problem influences overall health and the risks it imposes  Reviewed plan for weight loss with lower calorie diet (via better food choices and also portion control or program like weight watchers) and exercise building up to or more than 30 minutes 5 days per week including some aerobic activity   Enc her to keep walking on [employer] produce in diet - frozen or fresh

## 2018-11-11 NOTE — Assessment & Plan Note (Signed)
Due for 1 y dexa- ref made Taking vit D No falls or fractures On thyroid replacement  Walking on treadmill -commended

## 2018-11-11 NOTE — Assessment & Plan Note (Signed)
Continues in 120s  disc imp of low glycemic diet and wt loss to prevent DM2  Will do A1C next time

## 2018-11-11 NOTE — Assessment & Plan Note (Signed)
D level of 38 Vitamin D level is therapeutic with current supplementation Disc importance of this to bone and overall health

## 2018-11-24 DIAGNOSIS — H905 Unspecified sensorineural hearing loss: Secondary | ICD-10-CM | POA: Diagnosis not present

## 2018-12-20 ENCOUNTER — Other Ambulatory Visit: Payer: Self-pay | Admitting: Hematology

## 2018-12-20 ENCOUNTER — Ambulatory Visit
Admission: RE | Admit: 2018-12-20 | Discharge: 2018-12-20 | Disposition: A | Discharge status: Home / Self Care | Payer: Medicare Other | Source: Ambulatory Visit | Attending: Hematology | Admitting: Hematology

## 2018-12-20 ENCOUNTER — Other Ambulatory Visit: Payer: Self-pay

## 2018-12-20 DIAGNOSIS — Z170 Estrogen receptor positive status [ER+]: Secondary | ICD-10-CM

## 2018-12-20 DIAGNOSIS — C50411 Malignant neoplasm of upper-outer quadrant of right female breast: Secondary | ICD-10-CM

## 2018-12-20 DIAGNOSIS — R921 Mammographic calcification found on diagnostic imaging of breast: Secondary | ICD-10-CM

## 2019-02-03 ENCOUNTER — Other Ambulatory Visit: Payer: Self-pay | Admitting: Family Medicine

## 2019-02-07 ENCOUNTER — Other Ambulatory Visit: Payer: Medicare Other

## 2019-03-10 DIAGNOSIS — E890 Postprocedural hypothyroidism: Secondary | ICD-10-CM | POA: Diagnosis not present

## 2019-03-26 ENCOUNTER — Other Ambulatory Visit: Payer: Self-pay | Admitting: Cardiology

## 2019-03-29 DIAGNOSIS — L659 Nonscarring hair loss, unspecified: Secondary | ICD-10-CM | POA: Diagnosis not present

## 2019-03-29 DIAGNOSIS — R6889 Other general symptoms and signs: Secondary | ICD-10-CM | POA: Diagnosis not present

## 2019-03-29 DIAGNOSIS — E890 Postprocedural hypothyroidism: Secondary | ICD-10-CM | POA: Diagnosis not present

## 2019-03-29 DIAGNOSIS — E559 Vitamin D deficiency, unspecified: Secondary | ICD-10-CM | POA: Diagnosis not present

## 2019-04-21 ENCOUNTER — Other Ambulatory Visit: Payer: Self-pay

## 2019-04-21 ENCOUNTER — Ambulatory Visit
Admission: RE | Admit: 2019-04-21 | Discharge: 2019-04-21 | Disposition: A | Discharge status: Home / Self Care | Payer: Medicare Other | Source: Ambulatory Visit | Attending: Family Medicine | Admitting: Family Medicine

## 2019-04-21 DIAGNOSIS — E2839 Other primary ovarian failure: Secondary | ICD-10-CM

## 2019-04-21 DIAGNOSIS — Z780 Asymptomatic menopausal state: Secondary | ICD-10-CM | POA: Diagnosis not present

## 2019-04-21 DIAGNOSIS — M85852 Other specified disorders of bone density and structure, left thigh: Secondary | ICD-10-CM | POA: Diagnosis not present

## 2019-04-22 ENCOUNTER — Encounter: Payer: Self-pay | Admitting: *Deleted

## 2019-05-01 ENCOUNTER — Other Ambulatory Visit: Payer: Self-pay | Admitting: Family Medicine

## 2019-05-02 MED ORDER — POTASSIUM CHLORIDE CRYS ER 20 MEQ PO TBCR: EXTENDED_RELEASE_TABLET | ORAL | 1 refills | Status: DC

## 2019-05-10 DIAGNOSIS — L659 Nonscarring hair loss, unspecified: Secondary | ICD-10-CM | POA: Diagnosis not present

## 2019-05-10 DIAGNOSIS — E890 Postprocedural hypothyroidism: Secondary | ICD-10-CM | POA: Diagnosis not present

## 2019-05-10 DIAGNOSIS — R6889 Other general symptoms and signs: Secondary | ICD-10-CM | POA: Diagnosis not present

## 2019-06-20 ENCOUNTER — Other Ambulatory Visit: Payer: Self-pay

## 2019-06-20 ENCOUNTER — Ambulatory Visit
Admission: RE | Admit: 2019-06-20 | Discharge: 2019-06-20 | Disposition: A | Discharge status: Home / Self Care | Payer: Medicare Other | Source: Ambulatory Visit | Attending: Hematology | Admitting: Hematology

## 2019-06-20 ENCOUNTER — Other Ambulatory Visit: Payer: Self-pay | Admitting: Hematology

## 2019-06-20 DIAGNOSIS — R921 Mammographic calcification found on diagnostic imaging of breast: Secondary | ICD-10-CM

## 2019-06-20 DIAGNOSIS — Z9889 Other specified postprocedural states: Secondary | ICD-10-CM

## 2019-08-10 ENCOUNTER — Other Ambulatory Visit: Payer: Self-pay | Admitting: Family Medicine

## 2019-08-11 MED ORDER — FAMOTIDINE 20 MG PO TABS: 40.0000 mg | ORAL_TABLET | Freq: Two times a day (BID) | ORAL | 0 refills | Status: DC

## 2019-08-18 ENCOUNTER — Encounter: Payer: Self-pay | Admitting: Family Medicine

## 2019-09-05 ENCOUNTER — Ambulatory Visit (INDEPENDENT_AMBULATORY_CARE_PROVIDER_SITE_OTHER): Payer: Medicare Other | Admitting: Adult Health

## 2019-09-05 ENCOUNTER — Other Ambulatory Visit: Payer: Self-pay

## 2019-09-05 ENCOUNTER — Encounter: Payer: Self-pay | Admitting: Adult Health

## 2019-09-05 VITALS — BP 119/66 | HR 63 | Ht 68.0 in | Wt 204.0 lb

## 2019-09-05 DIAGNOSIS — G43719 Chronic migraine without aura, intractable, without status migrainosus: Secondary | ICD-10-CM | POA: Diagnosis not present

## 2019-09-05 NOTE — Patient Instructions (Addendum)
Your Plan:  Continue Topamax Try Ajovy If your symptoms worsen or you develop new symptoms please let us know.   Thank you for coming to see [place] at [facility]. I hope we have been able to provide you high quality care today.  You may receive a patient satisfaction survey over the next few weeks. We would appreciate your feedback and comments so that we may continue to improve ourselves and the health of our patients. Fremanezumab injection What is this medicine? FREMANEZUMAB (fre ma NEZ ue mab) is used to prevent migraine headaches. This medicine may be used for other purposes; ask your health care provider or pharmacist if you have questions. COMMON BRAND NAME(S): AJOVY What should I tell my health care provider before I take this medicine? They need to know if you have any of these conditions:  an unusual or allergic reaction to fremanezumab, other medicines, foods, dyes, or preservatives  pregnant or trying to get pregnant  breast-feeding How should I use this medicine? This medicine is for injection under the skin. You will be taught how to prepare and give this medicine. Use exactly as directed. Take your medicine at regular intervals. Do not take your medicine more often than directed. It is important that you put your used needles and syringes in a special sharps container. Do not put them in a trash can. If you do not have a sharps container, call your pharmacist or healthcare provider to get one. Talk to your pediatrician regarding the use of this medicine in children. Special care may be needed. Overdosage: If you think you have taken too much of this medicine contact a poison control center or emergency room at once. NOTE: This medicine is only for you. Do not share this medicine with others. What if I miss a dose? If you miss a dose, take it as soon as you can. If it is almost time for your next dose, take only that dose. Do not take double or extra  doses. What may interact with this medicine? Interactions are not expected. This list may not describe all possible interactions. Give your health care provider a list of all the medicines, herbs, non-prescription drugs, or dietary supplements you use. Also tell them if you smoke, drink alcohol, or use illegal drugs. Some items may interact with your medicine. What should I watch for while using this medicine? Tell your doctor or healthcare professional if your symptoms do not start to get better or if they get worse. What side effects may I notice from receiving this medicine? Side effects that you should report to your doctor or health care professional as soon as possible:  allergic reactions like skin rash, itching or hives, swelling of the face, lips, or tongue Side effects that usually do not require medical attention (report these to your doctor or health care professional if they continue or are bothersome):  pain, redness, or irritation at site where injected This list may not describe all possible side effects. Call your doctor for medical advice about side effects. You may report side effects to FDA at 1-800-FDA-1088. Where should I keep my medicine? Keep out of the reach of children. You will be instructed on how to store this medicine. Throw away any unused medicine after the expiration date on the label. NOTE: This sheet is a summary. It may not cover all possible information. If you have questions about this medicine, talk to your doctor, pharmacist, or health care provider.  2020 Elsevier/Gold  Standard (2016-10-13 17:22:56)

## 2019-09-05 NOTE — Progress Notes (Signed)
PATIENT: [name] [name] DOB: Aug 11, 1955  REASON FOR VISIT: follow up HISTORY FROM: patient  HISTORY OF PRESENT ILLNESS: Today 09/05/19:  [name] [name] is a 64 year old female with a history of intractable migraine headaches.  She returns today for follow-up.  She remains on Topamax.  She states that her headache frequency has increased.  She states that she has approximately 20 headache days out of the month.  She states that her migraine is still on the left side.  She states that she has been having sharp shooting pains with her headaches.  In the past she has tried Aimovig as well as multiple oral medications.  I have ordered Ajovy for her but she never tried it in the past.   Reports that she is also been having mammograms for possible breast cancer?  Reports that she has not had a biopsy yet.  HISTORY 09/01/18:  [name] [name] is a 64 year old female with intractable migraine headaches.  She returns today for follow-up.  At the last visit she was switched to [place].  She reports that she never started this medication.  She states that she has "so many problems" that she did not want to take any "additional medication."  Currently she feels that her headaches are under control.  She states that she is actually gone a month between her headache episodes.  She feels that Topamax is beneficial.  She returns today for follow-up.   REVIEW OF SYSTEMS: Out of a complete 14 system review of symptoms, the patient complains only of the following symptoms, and all other reviewed systems are negative.  See HPI  ALLERGIES: Allergies  Allergen Reactions  . Iohexol      Desc: throat selling resp distress hives.premedicate pt.entered 07/06/04, bsw.     HOME MEDICATIONS: Outpatient Medications Prior to Visit  Medication Sig Dispense Refill  . aspirin 81 MG tablet Take 81 mg by mouth daily.    Marland Kitchen CINNAMON PO Take 1,000 mg by mouth daily.     . Coenzyme Q10 (CO Q-10) 200 MG CAPS Take 1 capsule  by mouth daily.      . famotidine (PEPCID) 20 MG tablet Take 2 tablets (40 mg total) by mouth 2 (two) times daily. 360 tablet 0  . fish oil-omega-3 fatty acids 1000 MG capsule Take 1.2 g by mouth 2 (two) times daily.    . furosemide (LASIX) 20 MG tablet TAKE 1 TABLET (20 MG TOTAL) BY MOUTH DAILY. 90 tablet 3  . Ginkgo Biloba 120 MG CAPS Take 1 capsule by mouth daily.      Marland Kitchen KEFLEX 250 MG capsule     . levothyroxine (SYNTHROID) 125 MCG tablet TAKE ONE TABLET BY MOUTH IN THE MORNING ON AN EMPTY STOMACH SAT SUN.    Marland Kitchen levothyroxine (SYNTHROID, LEVOTHROID) 137 MCG tablet Take 137 mcg by mouth daily before breakfast.     . Magnesium 250 MG TABS Take 250 mg by mouth daily.    . metoprolol tartrate (LOPRESSOR) 25 MG tablet Take 0.5 tablets (12.5 mg total) by mouth every 4 (four) hours as needed (As needed for palpitations). 30 tablet 5  . Multiple Vitamin (MULTIVITAMIN) capsule Take 1 capsule by mouth daily.    . nitroGLYCERIN (NITROSTAT) 0.4 MG SL tablet Place 1 tablet (0.4 mg total) under the tongue every 5 (five) minutes as needed. Pain 25 tablet 3  . pantoprazole (PROTONIX) 40 MG tablet Take 1 tablet by mouth twice daily 180 tablet 2  . potassium chloride SA (KLOR-CON)  20 MEQ tablet TAKE 2 TABLETS BY MOUTH TWICE DAILY AND TAKE 1/2 TABLET BY MOUTH AT BEDTIME. 405 tablet 1  . propranolol ER (INDERAL LA) 60 MG 24 hr capsule Take 1 capsule by mouth once daily at bedtime 90 capsule 1  . Riboflavin 400 MG TABS Take 400 mg by mouth daily.    . simvastatin (ZOCOR) 40 MG tablet Take 1 tablet (40 mg total) by mouth at bedtime. 90 tablet 3  . topiramate (TOPAMAX) 100 MG tablet Take 1 tablet (100 mg total) by mouth 2 (two) times daily. 180 tablet 3  . XIIDRA 5 % SOLN Apply 1 drop to eye 2 (two) times daily. 1 drop in each eye Twice daily     No facility-administered medications prior to visit.    PAST MEDICAL HISTORY: Past Medical History:  Diagnosis Date  . Anxiety   . Breast cancer ([place])     ER+/PR+/Her2-  . Concussion 05/26/14  . Depression   . Frequent UTI   . GERD (gastroesophageal reflux disease)   . Hypothyroid   . Microscopic hematuria   . Migraine   . Personal history of radiation therapy   . Radiation 03/22/14-04/19/14   Breast Cancer upper-outer  . Restless leg syndrome   . Seasonal allergies   . Stented coronary artery    LAD 95% 2007.  . Vitamin D deficiency     PAST SURGICAL HISTORY: Past Surgical History:  Procedure Laterality Date  . ABDOMINAL HYSTERECTOMY    . APPENDECTOMY    . back surgery  1997   herniated disc repair  . BREAST LUMPECTOMY Right    2016  . CARDIAC CATHETERIZATION  2007   stent  . CHOLECYSTECTOMY OPEN    . CYSTOSCOPY     neg  . RADIOACTIVE SEED GUIDED PARTIAL MASTECTOMY WITH AXILLARY SENTINEL LYMPH NODE BIOPSY Right 02/14/2014   Procedure: RADIOACTIVE SEED GUIDED PARTIAL MASTECTOMY WITH AXILLARY SENTINEL LYMPH NODE BIOPSY;  Surgeon: Erroll Luna, MD;  Location: [facility];  Service: General;  Laterality: Right;  . SHOULDER SURGERY      FAMILY HISTORY: Family History  Problem Relation Age of Onset  . Coronary artery disease Mother   . Thyroid disease Mother   . Alzheimer's disease Mother   . Diabetes Mother   . Heart disease Mother   . Cancer Brother        melnoma  . Ovarian cancer Paternal Aunt 61  . Bone cancer Paternal Uncle   . Alzheimer's disease Maternal Grandmother   . Heart disease Maternal Grandfather   . Pancreatic cancer Maternal Grandfather 50  . Ovarian cancer Maternal Aunt        dx in 59s  . Throat cancer Maternal Uncle        smoker  . Lung cancer Maternal Uncle        heavy smoker  . Ovarian cancer Paternal Aunt        dx in her 39s  . Cancer Cousin        "female cancer"  . Cancer Father     SOCIAL HISTORY: Social History   Socioeconomic History  . Marital status: Widowed    Spouse name: Not on file  . Number of children: 1  . Years of education: some coll.  . Highest  education level: Not on file  Occupational History  . Occupation: not working  Tobacco Use  . Smoking status: Never Smoker  . Smokeless tobacco: Never Used  Vaping Use  . Vaping  Use: Never used  Substance and Sexual Activity  . Alcohol use: No    Alcohol/week: 0.0 standard drinks    Comment: socially  . Drug use: No  . Sexual activity: Not Currently  Other Topics Concern  . Not on file  Social History Narrative   Does not get any regular exercise      Doctors   -uro-[facility]   -cardio--hochrein   -Neurology--Dr. Jannifer Franklin   -allergies--Dr. Carmelina Peal   Gyn past--Dr. Warnell Forester   Couselor--Dr. Mitchum   Endo-- Dr. Chalmers Cater      Lives with son who is 90 years old   Patient is right handed.   Patient drinks 1-2 cups of caffeine daily.   Social Determinants of Health   Financial Resource Strain:   . Difficulty of Paying Living Expenses:   Food Insecurity:   . Worried About Charity fundraiser in the Last Year:   . Arboriculturist in the Last Year:   Transportation Needs:   . Film/video editor (Medical):   Marland Kitchen Lack of Transportation (Non-Medical):   Physical Activity:   . Days of Exercise per Week:   . Minutes of Exercise per Session:   Stress:   . Feeling of Stress :   Social Connections:   . Frequency of Communication with Friends and Family:   . Frequency of Social Gatherings with Friends and Family:   . Attends Religious Services:   . Active Member of Clubs or Organizations:   . Attends Archivist Meetings:   Marland Kitchen Marital Status:   Intimate Partner Violence:   . Fear of Current or Ex-Partner:   . Emotionally Abused:   Marland Kitchen Physically Abused:   . Sexually Abused:       PHYSICAL EXAM  Vitals:   09/05/19 1435  BP: 119/66  Pulse: 63  Weight: 204 lb (92.5 kg)  Height: '5\' 8"'  (1.727 m)   Body mass index is 31.02 kg/m.  Generalized: Well developed, in no acute distress   Neurological examination  Mentation: Alert oriented to time, place, history taking.  Follows all commands speech and language fluent Cranial nerve II-XII: Pupils were equal round reactive to light. Extraocular movements were full, visual field were full on confrontational test. Facial sensation and strength were normal. Uvula tongue midline. Head turning and shoulder shrug  were normal and symmetric. Motor: The motor testing reveals 5 over 5 strength of all 4 extremities. Good symmetric motor tone is noted throughout.  Sensory: Sensory testing is intact to soft touch on all 4 extremities. No evidence of extinction is noted.  Coordination: Cerebellar testing reveals good finger-nose-finger and heel-to-shin bilaterally.  Gait and station: Gait is normal. Tandem gait is normal. Romberg is negative. No drift is seen.  Reflexes: Deep tendon reflexes are symmetric and normal bilaterally.   DIAGNOSTIC DATA (LABS, IMAGING, TESTING) - I reviewed patient records, labs, notes, testing and imaging myself where available.  Lab Results  Component Value Date   WBC 4.7 09/10/2018   HGB 13.0 09/10/2018   HCT 38.3 09/10/2018   MCV 90.3 09/10/2018   PLT 158 09/10/2018      Component Value Date/Time   NA 141 09/10/2018 1335   NA 145 08/15/2016 1326   K 4.4 09/10/2018 1335   K 4.8 08/15/2016 1326   CL 111 09/10/2018 1335   CO2 20 (L) 09/10/2018 1335   CO2 22 08/15/2016 1326   GLUCOSE 122 (H) 09/10/2018 1335   GLUCOSE 129 08/15/2016 1326   BUN  15 09/10/2018 1335   BUN 15.4 08/15/2016 1326   CREATININE 1.51 (H) 09/10/2018 1335   CREATININE 1.20 (H) 06/26/2017 1531   CREATININE 1.5 (H) 08/15/2016 1326   CALCIUM 9.6 09/10/2018 1335   CALCIUM 10.4 08/15/2016 1326   PROT 7.0 10/29/2018 1604   PROT 7.5 08/15/2016 1326   ALBUMIN 4.7 10/29/2018 1604   ALBUMIN 4.2 08/15/2016 1326   AST 41 (H) 10/29/2018 1604   AST 28 08/15/2016 1326   ALT 42 (H) 10/29/2018 1604   ALT 27 08/15/2016 1326   ALKPHOS 67 10/29/2018 1604   ALKPHOS 58 08/15/2016 1326   BILITOT 1.4 (H) 10/29/2018 1604    BILITOT 1.62 (H) 08/15/2016 1326   GFRNONAA 36 (L) 09/10/2018 1335   GFRAA 42 (L) 09/10/2018 1335   Lab Results  Component Value Date   CHOL 167 10/29/2018   HDL 50 10/29/2018   LDLCALC 89 10/29/2018   TRIG 164 (H) 10/29/2018   CHOLHDL 3.3 10/29/2018   Lab Results  Component Value Date   HGBA1C 5.7 (H) 06/26/2017   Lab Results  Component Value Date   BOBOFPUL24 932 12/11/2016   Lab Results  Component Value Date   TSH 0.23 (L) 06/26/2017      ASSESSMENT AND PLAN 64 y.o. year old female  has a past medical history of Anxiety, Breast cancer ([place]), Concussion (05/26/14), Depression, Frequent UTI, GERD (gastroesophageal reflux disease), Hypothyroid, Microscopic hematuria, Migraine, Personal history of radiation therapy, Radiation (03/22/14-04/19/14), Restless leg syndrome, Seasonal allergies, Stented coronary artery, and Vitamin D deficiency. here with :  1.  Migraine headaches   Continue Topamax  Discussed starting Ajovy gave her some information she will call if she wants to start this  Discussed imaging however her physical exam was relatively unremarkable.  Patient deferred for now  Follow-up in 6 months or sooner if needed  I spent 30 minutes of face-to-face and non-face-to-face time with patient.  This included previsit chart review, lab review, study review, order entry, electronic health record documentation, patient education.  Ward Givens, MSN, NP-C 09/05/2019, 2:32 PM [facility] [address] [place], [place] [address] [phone]

## 2019-09-06 NOTE — Progress Notes (Signed)
I have read the note, and I agree with the clinical assessment and plan.  Jeffrey Graefe K Aleyza Salmi   

## 2019-09-11 NOTE — Progress Notes (Addendum)
[place]   [phone] Fax:([phone]) [phone]   Clinic Follow up Note   Patient Care Team: Tower, Wynelle Fanny, MD as PCP - General Minus Breeding, MD as Consulting Physician (Cardiology) Holley Bouche, NP (Inactive) as Nurse Practitioner (Nurse Practitioner) Erroll Luna, MD as Consulting Physician (General Surgery) Thea Silversmith, MD as Consulting Physician (Radiation Oncology) Truitt Merle, MD as Consulting Physician (Hematology) Susa Day, MD as Consulting Physician (Orthopedic Surgery) Date of Service: 09/12/2019   CHIEF COMPLAINT: F/u right breast cancer   SUMMARY OF ONCOLOGIC HISTORY: Oncology History Overview Note  Cancer Staging Breast cancer of upper-outer quadrant of right female breast [facility]) Staging form: Breast, AJCC 7th Edition - Clinical stage from 01/04/2014: Stage IA (T1b, N0, M0) - Unsigned - Pathologic stage from 02/14/2014: Stage IA (T1b, N0, cM0) - Unsigned     Breast cancer of upper-outer quadrant of right female breast ([place])  12/29/2013 Imaging   Screening mammogram and [place]: an irregular shadowing hypoechoic mass right breast 9 o'clock location 4 cm from the nipple measuring 7 x 7 x 7 mm. No adenopathy    12/29/2013 Initial Diagnosis   Breast cancer of upper-outer quadrant of right female breast   12/29/2013 Initial Biopsy   Grade I-II IDC, ER+ (98%), PR+ (81%), HER2- (ratio 1.24), Ki67 15%    01/11/2014 Procedure   Genetic counseling/testing: Revealed 1 VUS in ATM gene, p.D1641H (c.4921G>C).  Otherwise, genetic testing negative.    02/14/2014 Surgery   Right lumpectomy with SLNB ([place]): Grade 2 IDC spanning 0.9 cm with associated grade 2 DCIS.  Negative margins. 5 sentinel lymph nodes negative.  HER2 repeated and neg. (ratio 1.16).   02/14/2014 Pathologic Stage   pT1bpN0; Stage IA   02/14/2014 Oncotype testing   Recurrence score 21 (13% risk of distant recurrence); no adjuvant chemo offered.    03/22/2014 - 04/19/2014  Radiation Therapy   Adjuvant radiation Pablo Ledger); Right breast: Total dose 42.72 Gy over 21 fractions. Right breast boost: Total dose 10 Gy over 5 fractions.     Anti-estrogen oral therapy   Anti-estrogen therapy was recommended by Dr. Burr Medico; pt declines anti-estrogen treatment.     05/25/2014 Survivorship   Survivorship Care Plan given to patient and reviewed with her in-person.    05/30/2016 Imaging   US Abdomen 05/30/16 IMPRESSION: Status post cholecystectomy. Pancreas not visualized due to overlying bowel gas. Increased echogenicity of hepatic parenchyma is noted diffusely suggesting fatty infiltration or other diffuse hepatocellular disease.   07/02/2016 Imaging   DEXA 07/02/16 T score of -1.8, indicating osteopenia   12/15/2016 Mammogram   IMPRESSION: 1. No mammographic evidence of malignancy in either breast. 2. Stable right breast post lumpectomy changes.     CURRENT THERAPY:  Surveillance   INTERVAL HISTORY: [name] [name] returns for f/u as scheduled. She was last seen 09/10/18. She had right mammogram on 06/20/19 showed slight improvement in what was felt to represent calcifications within an area of fat necrosis within the central portion of the right lumpectomy scar. She will have repeat mammo in 11/2019.   Today, she presents to clinic by herself.  She feels "up and down, some days are okay and other days feel like it is my last."  She has had chills for 2 years, no fever, attributes this to thyroid medicine fluctuations.  For 1 year she has continuous left upper quadrant pain and nausea.  She was referred to GI but not scheduled.  She has occasional abdominal bloating.  She notes bleeding from  hemorrhoids, she had a negative stool test within the last year.  Denies unintentional weight loss, change in bowel habits. She also has left breast heaviness which she thinks could be referred pain from the left upper quadrant.  Denies new breast mass, nipple discharge.    MEDICAL HISTORY:   Past Medical History:  Diagnosis Date  . Anxiety   . Breast cancer ([place])    ER+/PR+/Her2-  . Concussion 05/26/14  . Depression   . Frequent UTI   . GERD (gastroesophageal reflux disease)   . Hypothyroid   . Microscopic hematuria   . Migraine   . Personal history of radiation therapy   . Radiation 03/22/14-04/19/14   Breast Cancer upper-outer  . Restless leg syndrome   . Seasonal allergies   . Stented coronary artery    LAD 95% 2007.  . Vitamin D deficiency     SURGICAL HISTORY: Past Surgical History:  Procedure Laterality Date  . ABDOMINAL HYSTERECTOMY    . APPENDECTOMY    . back surgery  1997   herniated disc repair  . BREAST LUMPECTOMY Right    2016  . CARDIAC CATHETERIZATION  2007   stent  . CHOLECYSTECTOMY OPEN    . CYSTOSCOPY     neg  . RADIOACTIVE SEED GUIDED PARTIAL MASTECTOMY WITH AXILLARY SENTINEL LYMPH NODE BIOPSY Right 02/14/2014   Procedure: RADIOACTIVE SEED GUIDED PARTIAL MASTECTOMY WITH AXILLARY SENTINEL LYMPH NODE BIOPSY;  Surgeon: Erroll Luna, MD;  Location: [place];  Service: General;  Laterality: Right;  . SHOULDER SURGERY      I have reviewed the social history and family history with the patient and they are unchanged from previous note.  ALLERGIES:  is allergic to iohexol.  MEDICATIONS:  Current Outpatient Medications  Medication Sig Dispense Refill  . aspirin 81 MG tablet Take 81 mg by mouth daily.    Marland Kitchen CINNAMON PO Take 1,000 mg by mouth daily.     . Coenzyme Q10 (CO Q-10) 200 MG CAPS Take 1 capsule by mouth daily.      . famotidine (PEPCID) 20 MG tablet Take 2 tablets (40 mg total) by mouth 2 (two) times daily. 360 tablet 0  . fish oil-omega-3 fatty acids 1000 MG capsule Take 1.2 g by mouth 2 (two) times daily.    . furosemide (LASIX) 20 MG tablet TAKE 1 TABLET (20 MG TOTAL) BY MOUTH DAILY. 90 tablet 3  . Ginkgo Biloba 120 MG CAPS Take 1 capsule by mouth daily.      Marland Kitchen KEFLEX 250 MG capsule     . levothyroxine  (SYNTHROID, LEVOTHROID) 137 MCG tablet Take 137 mcg by mouth daily before breakfast. Pt takes 133m daily    . LIOTHYRONINE SODIUM PO 5 mg.     . Magnesium 250 MG TABS Take 250 mg by mouth daily.    . Multiple Vitamin (MULTIVITAMIN) capsule Take 1 capsule by mouth daily.    . nitroGLYCERIN (NITROSTAT) 0.4 MG SL tablet Place 1 tablet (0.4 mg total) under the tongue every 5 (five) minutes as needed. Pain 25 tablet 3  . pantoprazole (PROTONIX) 40 MG tablet Take 1 tablet by mouth twice daily 180 tablet 2  . potassium chloride SA (KLOR-CON) 20 MEQ tablet TAKE 2 TABLETS BY MOUTH TWICE DAILY AND TAKE 1/2 TABLET BY MOUTH AT BEDTIME. 405 tablet 1  . propranolol ER (INDERAL LA) 60 MG 24 hr capsule Take 1 capsule by mouth once daily at bedtime 90 capsule 1  . Riboflavin 400 MG  TABS Take 400 mg by mouth daily.    . simvastatin (ZOCOR) 40 MG tablet Take 1 tablet (40 mg total) by mouth at bedtime. 90 tablet 3  . topiramate (TOPAMAX) 100 MG tablet Take 1 tablet (100 mg total) by mouth 2 (two) times daily. 180 tablet 3  . XIIDRA 5 % SOLN Apply 1 drop to eye 2 (two) times daily. 1 drop in each eye Twice daily    . ergocalciferol (VITAMIN D2) 1.25 MG (50000 UT) capsule Take 1 capsule (50,000 Units total) by mouth once a week. 4 capsule 0  . metoprolol tartrate (LOPRESSOR) 25 MG tablet Take 0.5 tablets (12.5 mg total) by mouth every 4 (four) hours as needed (As needed for palpitations). 30 tablet 5   No current facility-administered medications for this visit.    PHYSICAL EXAMINATION: ECOG PERFORMANCE STATUS: 1 - Symptomatic but completely ambulatory  Vitals:   09/12/19 1248  BP: (!) 123/53  Pulse: 64  Resp: 17  Temp: 98.2 F (36.8 C)  SpO2: 98%   Filed Weights   09/12/19 1248  Weight: 205 lb 6.4 oz (93.2 kg)    GENERAL:alert, no distress and comfortable SKIN: no rash to exposed skin  EYES:  sclera clear NECK: without mass LYMPH:  no palpable cervical or supraclavicular lymphadenopathy LUNGS:  clear with normal breathing effort HEART: regular rate & rhythm, no lower extremity edema ABDOMEN:abdomen soft, non-tender and normal bowel sounds.  No hepatosplenomegaly.  No LUQ tenderness  Musculoskeletal: focal tenderness to the lower anterior left ribs  NEURO: alert & oriented x 3 with fluent speech, no focal motor/sensory deficits Breast: S/p right lumpectomy.  Incision well-healed with mild scar tissue, no palpable mass.  TTP in the lower outer quadrant of left breast without discrete mass.  No nipple discharge or axillary adenopathy.  LABORATORY DATA:  I have reviewed the data as listed CBC Latest Ref Rng & Units 09/12/2019 09/10/2018 02/15/2018  WBC 4.0 - 10.5 K/uL 6.8 4.7 6.6  Hemoglobin 12.0 - 15.0 g/dL 12.9 13.0 14.0  Hematocrit 36 - 46 % 37.7 38.3 40.8  Platelets 150 - 400 K/uL 173 158 170     CMP Latest Ref Rng & Units 09/12/2019 10/29/2018 09/10/2018  Glucose 70 - 99 mg/dL 103(H) - 122(H)  BUN 8 - 23 mg/dL 19 - 15  Creatinine 0.44 - 1.00 mg/dL 1.51(H) - 1.51(H)  Sodium 135 - 145 mmol/L 139 - 141  Potassium 3.5 - 5.1 mmol/L 4.4 - 4.4  Chloride 98 - 111 mmol/L 112(H) - 111  CO2 22 - 32 mmol/L 20(L) - 20(L)  Calcium 8.9 - 10.3 mg/dL 10.3 - 9.6  Total Protein 6.5 - 8.1 g/dL 7.1 7.0 6.8  Total Bilirubin 0.3 - 1.2 mg/dL 1.7(H) 1.4(H) 1.4(H)  Alkaline Phos 38 - 126 U/L 56 67 57  AST 15 - 41 U/L 34 41(H) 33  ALT 0 - 44 U/L 41 42(H) 44      RADIOGRAPHIC STUDIES: I have personally reviewed the radiological images as listed and agreed with the findings in the report.  No results found.   ASSESSMENT & PLAN: [name] [name] is a 64 y.o. female with   1. pT1b N0 M0 stage IA invasive ductal carcinoma of right breast, grade II, ER/PR strongly positive, HER-2 negative, Oncotype RS 21 -She was diagnosed in 12/2013.She is s/pright breastlumpectomy,adjuvant breast radiation.She declined adjuvantantiestrogentherapy, due to the concern of side effects. -on surveillance   -11/2018 mammogram negative in left breast, showed calcifications on right  -6  mo f/u R mammo showed stable to slightly improved   2. Left breast and rib pain  -she wasn't aware of this pain until my exam, she attributed left breast heaviness to ongoing LUQ pain -left breast and ribs are tender on exam, no palpable mass or abnormality  -xray left ribs and chest is negative on 8/16, pending diagnostic mammo/US -if negative, may get CT chest   3.Intermittent LUQabdominalpain, nausea>1 year -She is on Protonix and Pepcid BID for GERD -Her last Endoscopy and colonoscopy was 10 years ago.  -Her CT AP from 02/2018 was negative  -she was referred to GI but not scheduled, patient states she was told it was not time for colonoscopy  -she has persistent symptoms   4. Osteopenia -last DEXA was in 06/2016, no high risk of fracture. -DEXA 04/21/19 showed osteopenia T score -2.3 at left femur neck -I encouraged her to optimize calcium and vitamin D and increase weight bearing exercises  -Vitamin D is low at 25, I recommend for her to take 50,000 IU weekly x4 then continue 1000-2000 IU daily   5. Mild hyperbilirubinemia  -She has had mild intermittent elevated total bilirubin since 01/2015, liver enzymes are normal -total bili at 1.7 today  6. CKD, Stage III -Scr 1.5 stable from last year -continue F/u with PCP     Disposition:  Ms. Jr appears stable. Right breast exam is benign. However the left breast shows tenderness in the lower outer quadrant and to the anterior ribs. No discrete mass. She has reported LUQ pain for over a year, exam in that area is benign. Bilateral mammogram in 11/2018 was negative in left breast but showed new likely benign calcifications in right breast that were slightly improved on repeat R mammo in 05/2019.   I obtained plain film of the left ribs and chest which is negative. I have referred her for left diagnostic mammogram and possible [place] to further  evaluate her left breast pain. I have low suspicion this is cancer-related. If mammo/US are negative and breast/rib pain persists I will consider CT chest. I plan to refer her back to GI, for LUQ pain, she was previously referred but not scheduled.   Otherwise, CBC and CMP are stable with CKD and mild hyperbilirubinemia. I encouraged her to f/u with PCP for routine health.   If work up is negative. I plan to see her back for routine f/u in 6 months.    Orders Placed This Encounter  Procedures  . DG Ribs Unilateral W/Chest Left    Standing Status:   Future    Number of Occurrences:   1    Standing Expiration Date:   09/11/2020    Order Specific Question:   Reason for Exam (SYMPTOM  OR DIAGNOSIS REQUIRED)    Answer:   LUQ, left rib, breast pain    Order Specific Question:   Preferred imaging location?    Answer:   [facility]    Order Specific Question:   Radiology Contrast Protocol - do NOT remove file path    Answer:   [email or web address]  . MM DIAG BREAST TOMO UNI LEFT    Standing Status:   Future    Standing Expiration Date:   09/11/2020    Order Specific Question:   Reason for Exam (SYMPTOM  OR DIAGNOSIS REQUIRED)    Answer:   left breast pain, tenderness to palpation lower outer quadrant; h/o right breast cancer    Order Specific Question:   Preferred imaging  location?    Answer:   [facility]  . US BREAST LTD UNI LEFT INC AXILLA    Standing Status:   Future    Standing Expiration Date:   09/15/2020    Order Specific Question:   Reason for Exam (SYMPTOM  OR DIAGNOSIS REQUIRED)    Answer:   left breast pain, h/o right breast cancer    Order Specific Question:   Preferred imaging location?    Answer:   [facility]  . Ambulatory referral to Gastroenterology    Referral Priority:   Routine    Referral Type:   Consultation    Referral Reason:   Specialty Services Required    Referred to Provider:   Gatha Mayer, MD     Number of Visits Requested:   1   All questions were answered. The patient knows to call the clinic with any problems, questions or concerns. No barriers to learning was detected. Total encounter time was 40 minutes.      Alla Feeling, NP 09/16/19

## 2019-09-12 ENCOUNTER — Inpatient Hospital Stay (HOSPITAL_BASED_OUTPATIENT_CLINIC_OR_DEPARTMENT_OTHER): Payer: Medicare Other | Admitting: Nurse Practitioner

## 2019-09-12 ENCOUNTER — Other Ambulatory Visit: Payer: Self-pay

## 2019-09-12 ENCOUNTER — Ambulatory Visit (HOSPITAL_COMMUNITY)
Admission: RE | Admit: 2019-09-12 | Discharge: 2019-09-12 | Disposition: A | Discharge status: Home / Self Care | Payer: Medicare Other | Source: Ambulatory Visit | Attending: Nurse Practitioner | Admitting: Nurse Practitioner

## 2019-09-12 ENCOUNTER — Inpatient Hospital Stay: Payer: Medicare Other | Attending: Nurse Practitioner

## 2019-09-12 VITALS — BP 123/53 | HR 64 | Temp 98.2°F | Resp 17 | Ht 68.0 in | Wt 205.4 lb

## 2019-09-12 DIAGNOSIS — Z170 Estrogen receptor positive status [ER+]: Secondary | ICD-10-CM

## 2019-09-12 DIAGNOSIS — M85852 Other specified disorders of bone density and structure, left thigh: Secondary | ICD-10-CM | POA: Diagnosis not present

## 2019-09-12 DIAGNOSIS — R1012 Left upper quadrant pain: Secondary | ICD-10-CM

## 2019-09-12 DIAGNOSIS — E559 Vitamin D deficiency, unspecified: Secondary | ICD-10-CM | POA: Diagnosis not present

## 2019-09-12 DIAGNOSIS — Z9049 Acquired absence of other specified parts of digestive tract: Secondary | ICD-10-CM | POA: Diagnosis not present

## 2019-09-12 DIAGNOSIS — R0781 Pleurodynia: Secondary | ICD-10-CM | POA: Diagnosis not present

## 2019-09-12 DIAGNOSIS — C50411 Malignant neoplasm of upper-outer quadrant of right female breast: Secondary | ICD-10-CM

## 2019-09-12 DIAGNOSIS — R110 Nausea: Secondary | ICD-10-CM | POA: Diagnosis not present

## 2019-09-12 DIAGNOSIS — Z853 Personal history of malignant neoplasm of breast: Secondary | ICD-10-CM | POA: Diagnosis not present

## 2019-09-12 DIAGNOSIS — Z79899 Other long term (current) drug therapy: Secondary | ICD-10-CM | POA: Diagnosis not present

## 2019-09-12 DIAGNOSIS — N644 Mastodynia: Secondary | ICD-10-CM | POA: Insufficient documentation

## 2019-09-12 DIAGNOSIS — N183 Chronic kidney disease, stage 3 unspecified: Secondary | ICD-10-CM | POA: Insufficient documentation

## 2019-09-12 DIAGNOSIS — Z923 Personal history of irradiation: Secondary | ICD-10-CM | POA: Diagnosis not present

## 2019-09-12 DIAGNOSIS — K219 Gastro-esophageal reflux disease without esophagitis: Secondary | ICD-10-CM | POA: Insufficient documentation

## 2019-09-12 LAB — COMPREHENSIVE METABOLIC PANEL
ALT: 41 U/L (ref 0–44)
AST: 34 U/L (ref 15–41)
Albumin: 4 g/dL (ref 3.5–5.0)
Alkaline Phosphatase: 56 U/L (ref 38–126)
Anion gap: 7 (ref 5–15)
BUN: 19 mg/dL (ref 8–23)
CO2: 20 mmol/L — ABNORMAL LOW (ref 22–32)
Calcium: 10.3 mg/dL (ref 8.9–10.3)
Chloride: 112 mmol/L — ABNORMAL HIGH (ref 98–111)
Creatinine, Ser: 1.51 mg/dL — ABNORMAL HIGH (ref 0.44–1.00)
GFR calc Af Amer: 42 mL/min — ABNORMAL LOW (ref >60)
GFR calc non Af Amer: 36 mL/min — ABNORMAL LOW (ref >60)
Glucose, Bld: 103 mg/dL — ABNORMAL HIGH (ref 70–99)
Potassium: 4.4 mmol/L (ref 3.5–5.1)
Sodium: 139 mmol/L (ref 135–145)
Total Bilirubin: 1.7 mg/dL — ABNORMAL HIGH (ref 0.3–1.2)
Total Protein: 7.1 g/dL (ref 6.5–8.1)

## 2019-09-12 LAB — CBC WITH DIFFERENTIAL/PLATELET
Abs Immature Granulocytes: 0.01 10*3/uL (ref 0.00–0.07)
Basophils Absolute: 0 10*3/uL (ref 0.0–0.1)
Basophils Relative: 0 %
Eosinophils Absolute: 0.2 10*3/uL (ref 0.0–0.5)
Eosinophils Relative: 3 %
HCT: 37.7 % (ref 36.0–46.0)
Hemoglobin: 12.9 g/dL (ref 12.0–15.0)
Immature Granulocytes: 0 %
Lymphocytes Relative: 31 %
Lymphs Abs: 2.1 10*3/uL (ref 0.7–4.0)
MCH: 31.1 pg (ref 26.0–34.0)
MCHC: 34.2 g/dL (ref 30.0–36.0)
MCV: 90.8 fL (ref 80.0–100.0)
Monocytes Absolute: 0.7 10*3/uL (ref 0.1–1.0)
Monocytes Relative: 11 %
Neutro Abs: 3.8 10*3/uL (ref 1.7–7.7)
Neutrophils Relative %: 55 %
Platelets: 173 10*3/uL (ref 150–400)
RBC: 4.15 MIL/uL (ref 3.87–5.11)
RDW: 13 % (ref 11.5–15.5)
WBC: 6.8 10*3/uL (ref 4.0–10.5)
nRBC: 0 % (ref 0.0–0.2)

## 2019-09-12 LAB — VITAMIN D 25 HYDROXY (VIT D DEFICIENCY, FRACTURES): Vit D, 25-Hydroxy: 25.75 ng/mL — ABNORMAL LOW (ref 30–100)

## 2019-09-14 ENCOUNTER — Telehealth: Payer: Self-pay | Admitting: Nurse Practitioner

## 2019-09-14 NOTE — Telephone Encounter (Signed)
No 8/16 los

## 2019-09-16 ENCOUNTER — Encounter: Payer: Self-pay | Admitting: Nurse Practitioner

## 2019-09-16 MED ORDER — ERGOCALCIFEROL 1.25 MG (50000 UT) PO CAPS: 50000.0000 [IU] | ORAL_CAPSULE | ORAL | 0 refills | Status: DC

## 2019-09-20 ENCOUNTER — Telehealth: Payer: Self-pay

## 2019-09-20 NOTE — Telephone Encounter (Signed)
Left mv for [name] [name] to call me to review xray report and recommendations.

## 2019-09-28 ENCOUNTER — Other Ambulatory Visit: Payer: Self-pay | Admitting: Cardiology

## 2019-10-18 ENCOUNTER — Ambulatory Visit
Admission: RE | Admit: 2019-10-18 | Discharge: 2019-10-18 | Disposition: A | Discharge status: Home / Self Care | Payer: Medicare Other | Source: Ambulatory Visit | Attending: Nurse Practitioner | Admitting: Nurse Practitioner

## 2019-10-18 ENCOUNTER — Other Ambulatory Visit: Payer: Self-pay

## 2019-10-18 DIAGNOSIS — R928 Other abnormal and inconclusive findings on diagnostic imaging of breast: Secondary | ICD-10-CM | POA: Diagnosis not present

## 2019-10-18 DIAGNOSIS — Z170 Estrogen receptor positive status [ER+]: Secondary | ICD-10-CM

## 2019-10-18 DIAGNOSIS — N644 Mastodynia: Secondary | ICD-10-CM

## 2019-10-18 DIAGNOSIS — N6489 Other specified disorders of breast: Secondary | ICD-10-CM | POA: Diagnosis not present

## 2019-10-18 DIAGNOSIS — Z853 Personal history of malignant neoplasm of breast: Secondary | ICD-10-CM | POA: Diagnosis not present

## 2019-10-19 ENCOUNTER — Encounter: Payer: Self-pay | Admitting: Nurse Practitioner

## 2019-10-21 ENCOUNTER — Telehealth: Payer: Self-pay | Admitting: Nurse Practitioner

## 2019-10-21 NOTE — Telephone Encounter (Signed)
Called pt - no answer per 9/24 sch msg - mailed reminder letter with appt date and time

## 2019-10-27 ENCOUNTER — Other Ambulatory Visit: Payer: Self-pay | Admitting: Cardiology

## 2019-10-27 ENCOUNTER — Other Ambulatory Visit: Payer: Self-pay | Admitting: Family Medicine

## 2019-10-27 ENCOUNTER — Other Ambulatory Visit: Payer: Self-pay | Admitting: Nurse Practitioner

## 2019-10-27 ENCOUNTER — Ambulatory Visit: Payer: Medicare Other | Admitting: Cardiology

## 2019-10-27 DIAGNOSIS — E559 Vitamin D deficiency, unspecified: Secondary | ICD-10-CM

## 2019-10-28 NOTE — Telephone Encounter (Signed)
Pt hasn't been seen in almost a year and no future appts., please advise  

## 2019-10-28 NOTE — Telephone Encounter (Signed)
Med refilled on and [facility] will reach out to pt to try and get appt scheduled

## 2019-10-28 NOTE — Telephone Encounter (Signed)
Please schedule PE in the winter and refill until then

## 2019-10-31 NOTE — Telephone Encounter (Signed)
I left a detailed message on patient's voice mail to call back and schedule CPX in the winter.

## 2019-11-01 NOTE — Telephone Encounter (Signed)
Refill request

## 2019-11-23 NOTE — Progress Notes (Signed)
Cardiology Office Note   Date:  11/24/2019   ID:  Nova, Schmuhl 04-Jul-1955, MRN [id number]  PCP:  Abner Greenspan, MD  Cardiologist:   No primary care provider on file.   Chief Complaint  Patient presents with  . Chest Pain      History of Present Illness: [name] [name] is a 64 y.o. female who presents for follow up of her known coronary disease.  In 2017 she had a Lexiscan Myoview prior to shoulder surgery.  This was low risk.     The patient returns for follow-up.  Since I last saw her she has continued to have this discomfort under her left breast.  It is sporadic and unchanged from what she had last year.  It radiates to the left shoulder and into the back.  It comes and goes spontaneously.  She does do some walking with exercise but not routinely.  However, with her level of walking she does not describe this chest discomfort.  She had a negative perfusion study last year to evaluate this.  She is having evaluation for possible recurrent breast cancer.  She does report that when she gets this discomfort her heart rate will typically go up.  It might be about 100.  It is typically in the 60s.  She gets anxious with this.  She does have as needed beta-blocker to take which she takes about 4-5 times per month.  Past Medical History:  Diagnosis Date  . Anxiety   . Breast cancer ([place])    ER+/PR+/Her2-  . Concussion 05/26/14  . Depression   . Frequent UTI   . GERD (gastroesophageal reflux disease)   . Hypothyroid   . Microscopic hematuria   . Migraine   . Personal history of radiation therapy   . Radiation 03/22/14-04/19/14   Breast Cancer upper-outer  . Restless leg syndrome   . Seasonal allergies   . Stented coronary artery    LAD 95% 2007.  . Vitamin D deficiency     Past Surgical History:  Procedure Laterality Date  . ABDOMINAL HYSTERECTOMY    . APPENDECTOMY    . back surgery  1997   herniated disc repair  . BREAST LUMPECTOMY Right    2016    . CARDIAC CATHETERIZATION  2007   stent  . CHOLECYSTECTOMY OPEN    . CYSTOSCOPY     neg  . RADIOACTIVE SEED GUIDED PARTIAL MASTECTOMY WITH AXILLARY SENTINEL LYMPH NODE BIOPSY Right 02/14/2014   Procedure: RADIOACTIVE SEED GUIDED PARTIAL MASTECTOMY WITH AXILLARY SENTINEL LYMPH NODE BIOPSY;  Surgeon: Erroll Luna, MD;  Location: [place];  Service: General;  Laterality: Right;  . SHOULDER SURGERY       Current Outpatient Medications  Medication Sig Dispense Refill  . aspirin 81 MG tablet Take 81 mg by mouth daily.    Marland Kitchen CINNAMON PO Take 1,000 mg by mouth daily.     . Coenzyme Q10 (CO Q-10) 200 MG CAPS Take 1 capsule by mouth daily.      . fish oil-omega-3 fatty acids 1000 MG capsule Take 1.2 g by mouth 2 (two) times daily.    . furosemide (LASIX) 20 MG tablet Take 1 tablet by mouth once daily 90 tablet 0  . Ginkgo Biloba 120 MG CAPS Take 1 capsule by mouth daily.      Marland Kitchen KEFLEX 250 MG capsule     . levothyroxine (SYNTHROID, LEVOTHROID) 137 MCG tablet Take 137 mcg by mouth daily  before breakfast. Pt takes 100 mcg daily    . LIOTHYRONINE SODIUM PO 5 mg.     . Magnesium 250 MG TABS Take 250 mg by mouth daily.    . Multiple Vitamin (MULTIVITAMIN) capsule Take 1 capsule by mouth daily.    . nitroGLYCERIN (NITROSTAT) 0.4 MG SL tablet Place 1 tablet (0.4 mg total) under the tongue every 5 (five) minutes as needed. Pain 25 tablet 11  . pantoprazole (PROTONIX) 40 MG tablet Take 1 tablet by mouth twice daily 180 tablet 0  . potassium chloride SA (KLOR-CON) 20 MEQ tablet TAKE 2 TABLETS BY MOUTH TWICE DAILY AND TAKE 1/2 TABLET DAILY AT BEDTIME 405 tablet 0  . propranolol ER (INDERAL LA) 60 MG 24 hr capsule Take 1 capsule by mouth once daily at bedtime 60 capsule 1  . Riboflavin 400 MG TABS Take 400 mg by mouth daily.    . simvastatin (ZOCOR) 40 MG tablet TAKE 1 TABLET BY MOUTH AT BEDTIME 90 tablet 0  . topiramate (TOPAMAX) 100 MG tablet Take 1 tablet (100 mg total) by mouth 2 (two)  times daily. 180 tablet 3  . Vitamin D, Ergocalciferol, (DRISDOL) 1.25 MG (50000 UNIT) CAPS capsule Take 1 capsule by mouth once a week 4 capsule 0  . XIIDRA 5 % SOLN Apply 1 drop to eye 2 (two) times daily. 1 drop in each eye Twice daily    . famotidine (PEPCID) 20 MG tablet Take 2 tablets (40 mg total) by mouth 2 (two) times daily. 360 tablet 0  . metoprolol tartrate (LOPRESSOR) 25 MG tablet Take 0.5 tablets (12.5 mg total) by mouth every 4 (four) hours as needed (As needed for palpitations). 30 tablet 5   No current facility-administered medications for this visit.    Allergies:   Iohexol   ROS:  Please see the history of present illness.   Otherwise, review of systems are positive for none.   All other systems are reviewed and negative.    PHYSICAL EXAM: VS:  BP 104/67   Pulse (!) 59   Ht _0  (1.727 m)   Wt 198 lb 12.8 oz (90.2 kg)   SpO2 96%   BMI 30.23 kg/m  , BMI Body mass index is 30.23 kg/m. GENERAL:  Well appearing NECK:  No jugular venous distention, waveform within normal limits, carotid upstroke brisk and symmetric, no bruits, no thyromegaly LUNGS:  Clear to auscultation bilaterally CHEST:  Unremarkable HEART:  PMI not displaced or sustained,S1 and S2 within normal limits, no S3, no S4, no clicks, no rubs, no murmurs ABD:  Flat, positive bowel sounds normal in frequency in pitch, no bruits, no rebound, no guarding, no midline pulsatile mass, no hepatomegaly, no splenomegaly EXT:  2 plus pulses throughout, no edema, no cyanosis no clubbing   EKG:  EKG is ordered today. The ekg ordered today demonstrates sinus rhythm, rate 59, left axis deviation, poor anterior R wave progression, no acute ST-T wave changes.   Recent Labs: 09/12/2019: ALT 41; BUN 19; Creatinine, Ser 1.51; Hemoglobin 12.9; Platelets 173; Potassium 4.4; Sodium 139    Lipid Panel    Component Value Date/Time   CHOL 167 10/29/2018 1604   TRIG 164 (H) 10/29/2018 1604   HDL 50 10/29/2018 1604    CHOLHDL 3.3 10/29/2018 1604   CHOLHDL 3.1 06/26/2017 1531   VLDL 24.2 06/04/2016 1519   LDLCALC 89 10/29/2018 1604   LDLCALC 74 06/26/2017 1531      Wt Readings from Last 3 Encounters:  11/24/19 198 lb 12.8 oz (90.2 kg)  09/12/19 205 lb 6.4 oz (93.2 kg)  09/05/19 204 lb (92.5 kg)      Other studies Reviewed: Additional studies/ records that were reviewed today include: Labs. Review of the above records demonstrates:  Please see elsewhere in the note.     ASSESSMENT AND PLAN:   CHEST PAIN/CAD - Her chest pain is atypical.  She had a negative stress test.  No further cardiac work-up is suggested at this point.  DYSLIPIDEMIA -  I will check a fasting lipid profile liver enzymes when she comes back to get blood work done by her primary provider.  Goal LDL will be less than 70.    Current medicines are reviewed at length with the patient today.  The patient does not have concerns regarding medicines.  The following changes have been made:  no change  Labs/ tests ordered today include:   Orders Placed This Encounter  Procedures  . Lipid panel     Disposition:   FU with me in one year.     Signed, Minus Breeding, MD  11/24/2019 4:55 PM    [facility]

## 2019-11-24 ENCOUNTER — Ambulatory Visit (INDEPENDENT_AMBULATORY_CARE_PROVIDER_SITE_OTHER): Payer: Medicare Other | Admitting: Cardiology

## 2019-11-24 ENCOUNTER — Encounter: Payer: Self-pay | Admitting: Cardiology

## 2019-11-24 ENCOUNTER — Other Ambulatory Visit: Payer: Self-pay

## 2019-11-24 ENCOUNTER — Other Ambulatory Visit: Payer: Self-pay | Admitting: Adult Health

## 2019-11-24 ENCOUNTER — Other Ambulatory Visit: Payer: Self-pay | Admitting: Family Medicine

## 2019-11-24 VITALS — BP 104/67 | HR 59 | Ht 68.0 in | Wt 198.8 lb

## 2019-11-24 DIAGNOSIS — R002 Palpitations: Secondary | ICD-10-CM | POA: Diagnosis not present

## 2019-11-24 DIAGNOSIS — I2510 Atherosclerotic heart disease of native coronary artery without angina pectoris: Secondary | ICD-10-CM

## 2019-11-24 DIAGNOSIS — E785 Hyperlipidemia, unspecified: Secondary | ICD-10-CM

## 2019-11-24 MED ORDER — NITROGLYCERIN 0.4 MG SL SUBL
0.4000 mg | SUBLINGUAL_TABLET | SUBLINGUAL | 11 refills | Status: DC | PRN
Start: 2019-11-24 — End: 2020-12-24

## 2019-11-24 NOTE — Patient Instructions (Addendum)
Medication Instructions:  Your sublingual nitroglycerin has been refilled. *If you need a refill on your cardiac medications before your next appointment, please call your pharmacy*   Lab Work: [place] with your blood work you have drawn at your primary care provider If you have labs (blood work) drawn today and your tests are completely normal, you will receive your results only by: Marland Kitchen MyChart Message (if you have MyChart) OR . A paper copy in the mail If you have any lab test that is abnormal or we need to change your treatment, we will call you to review the results.   Testing/Procedures: None ordered   Follow-Up: At [facility], you and your health needs are our priority.  As part of our continuing mission to provide you with exceptional heart care, we have created designated Provider Care Teams.  These Care Teams include your primary Cardiologist (physician) and Advanced Practice Providers (APPs -  Physician Assistants and Nurse Practitioners) who all work together to provide you with the care you need, when you need it.  We recommend signing up for the patient portal called "MyChart".  Sign up information is provided on this After Visit Summary.  MyChart is used to connect with patients for Virtual Visits (Telemedicine).  Patients are able to view lab/test results, encounter notes, upcoming appointments, etc.  Non-urgent messages can be sent to your provider as well.   To learn more about what you can do with MyChart, go to [email or web address].    Your next appointment:   12 month(s)  The format for your next appointment:   In Person  Provider:   Minus Breeding, MD   Other Instructions None

## 2019-11-25 NOTE — Addendum Note (Signed)
Addended by: Wonda Horner on: 11/25/2019 07:54 AM   Modules accepted: Orders

## 2019-12-26 ENCOUNTER — Ambulatory Visit
Admission: RE | Admit: 2019-12-26 | Discharge: 2019-12-26 | Disposition: A | Discharge status: Home / Self Care | Payer: Medicare Other | Source: Ambulatory Visit | Attending: Hematology | Admitting: Hematology

## 2019-12-26 ENCOUNTER — Other Ambulatory Visit: Payer: Self-pay

## 2019-12-26 ENCOUNTER — Other Ambulatory Visit: Payer: Self-pay | Admitting: Hematology

## 2019-12-26 DIAGNOSIS — Z9889 Other specified postprocedural states: Secondary | ICD-10-CM

## 2019-12-26 DIAGNOSIS — R921 Mammographic calcification found on diagnostic imaging of breast: Secondary | ICD-10-CM

## 2020-01-04 ENCOUNTER — Ambulatory Visit
Admission: RE | Admit: 2020-01-04 | Discharge: 2020-01-04 | Disposition: A | Discharge status: Home / Self Care | Payer: Medicare Other | Source: Ambulatory Visit | Attending: Hematology | Admitting: Hematology

## 2020-01-04 ENCOUNTER — Other Ambulatory Visit: Payer: Self-pay | Admitting: Hematology

## 2020-01-04 ENCOUNTER — Other Ambulatory Visit: Payer: Self-pay

## 2020-01-04 DIAGNOSIS — R921 Mammographic calcification found on diagnostic imaging of breast: Secondary | ICD-10-CM | POA: Diagnosis not present

## 2020-01-04 DIAGNOSIS — Z9889 Other specified postprocedural states: Secondary | ICD-10-CM

## 2020-01-04 DIAGNOSIS — N6489 Other specified disorders of breast: Secondary | ICD-10-CM | POA: Diagnosis not present

## 2020-01-24 ENCOUNTER — Other Ambulatory Visit: Payer: Self-pay | Admitting: Family Medicine

## 2020-01-24 ENCOUNTER — Other Ambulatory Visit: Payer: Self-pay | Admitting: Cardiology

## 2020-01-27 ENCOUNTER — Other Ambulatory Visit: Payer: Self-pay | Admitting: Family Medicine

## 2020-01-27 ENCOUNTER — Other Ambulatory Visit: Payer: Self-pay | Admitting: Cardiology

## 2020-03-08 ENCOUNTER — Ambulatory Visit (INDEPENDENT_AMBULATORY_CARE_PROVIDER_SITE_OTHER): Payer: Medicare Other | Admitting: Adult Health

## 2020-03-08 DIAGNOSIS — G43009 Migraine without aura, not intractable, without status migrainosus: Secondary | ICD-10-CM

## 2020-03-08 MED ORDER — AJOVY 225 MG/1.5ML ~~LOC~~ SOAJ: 225.0000 mg | SUBCUTANEOUS | 5 refills | Status: DC

## 2020-03-08 MED ORDER — UBRELVY 50 MG PO TABS: ORAL_TABLET | ORAL | 5 refills | Status: DC

## 2020-03-08 NOTE — Patient Instructions (Addendum)
Your Plan:  Continue topamax Try [name] [name] for acute headache- take at the onset of migraine then repeat in 2 hours. Not to exceed 2 tablets in 24 hours If your symptoms worsen or you develop new symptoms please let us know.   Thank you for coming to see [place] at [facility]. I hope we have been able to provide you high quality care today.  You may receive a patient satisfaction survey over the next few weeks. We would appreciate your feedback and comments so that we may continue to improve ourselves and the health of our patients.  Ubrogepant tablets What is this medicine? UBROGEPANT (ue BROE je pant) is used to treat migraine headaches with or without aura. An aura is a strange feeling or visual disturbance that warns you of an attack. It is not used to prevent migraines. This medicine may be used for other purposes; ask your health care provider or pharmacist if you have questions. COMMON BRAND NAME(S): [name] [name] What should I tell my health care provider before I take this medicine? They need to know if you have any of these conditions:  kidney disease  liver disease  an unusual or allergic reaction to ubrogepant, other medicines, foods, dyes, or preservatives  pregnant or trying to get pregnant  breast-feeding How should I use this medicine? Take this medicine by mouth with a glass of water. Follow the directions on the prescription label. You can take it with or without food. If it upsets your stomach, take it with food. Take your medicine at regular intervals. Do not take it more often than directed. Do not stop taking except on your doctor's advice. Talk to your pediatrician about the use of this medicine in children. Special care may be needed. Overdosage: If you think you have taken too much of this medicine contact a poison control center or emergency room at once. NOTE: This medicine is only for you. Do not share this medicine with others. What if I miss a  dose? This does not apply. This medicine is not for regular use. What may interact with this medicine? Do not take this medicine with any of the following medicines:  ceritinib  certain antibiotics like chloramphenicol, clarithromycin, telithromycin  certain antivirals for HIV like atazanavir, cobicistat, darunavir, delavirdine, fosamprenavir, indinavir, ritonavir  certain medicines for fungal infections like itraconazole, ketoconazole, posaconazole, voriconazole  conivaptan  grapefruit  idelalisib  mifepristone  nefazodone  ribociclib This medicine may also interact with the following medications:  carvedilol  certain medicines for seizures like phenobarbital, phenytoin  ciprofloxacin  cyclosporine  eltrombopag  fluconazole  fluvoxamine  quinidine  rifampin  St. John's wort  verapamil This list may not describe all possible interactions. Give your health care provider a list of all the medicines, herbs, non-prescription drugs, or dietary supplements you use. Also tell them if you smoke, drink alcohol, or use illegal drugs. Some items may interact with your medicine. What should I watch for while using this medicine? Visit your health care professional for regular checks on your progress. Tell your health care professional if your symptoms do not start to get better or if they get worse. Your mouth may get dry. Chewing sugarless gum or sucking hard candy and drinking plenty of water may help. Contact your health care professional if the problem does not go away or is severe. What side effects may I notice from receiving this medicine? Side effects that you should report to your doctor or health care professional as  soon as possible:  allergic reactions like skin rash, itching or hives; swelling of the face, lips, or tongue Side effects that usually do not require medical attention (report these to your doctor or health care professional if they continue or are  bothersome):  drowsiness  dry mouth  nausea  tiredness This list may not describe all possible side effects. Call your doctor for medical advice about side effects. You may report side effects to FDA at 1-800-FDA-1088. Where should I keep my medicine? Keep out of the reach of children. Store at room temperature between 15 and 30 degrees C (59 and 86 degrees F). Throw away any unused medicine after the expiration date. NOTE: This sheet is a summary. It may not cover all possible information. If you have questions about this medicine, talk to your doctor, pharmacist, or health care provider.  2021 Elsevier/Gold Standard (2018-04-01 08:50:55) Rolanda Lundborg injection What is this medicine? FREMANEZUMAB (fre ma NEZ ue mab) is used to prevent migraine headaches. This medicine may be used for other purposes; ask your health care provider or pharmacist if you have questions. COMMON BRAND NAME(S): AJOVY What should I tell my health care provider before I take this medicine? They need to know if you have any of these conditions:  an unusual or allergic reaction to fremanezumab, other medicines, foods, dyes, or preservatives  pregnant or trying to get pregnant  breast-feeding How should I use this medicine? This medicine is for injection under the skin. You will be taught how to prepare and give this medicine. Use exactly as directed. Take your medicine at regular intervals. Do not take your medicine more often than directed. It is important that you put your used needles and syringes in a special sharps container. Do not put them in a trash can. If you do not have a sharps container, call your pharmacist or healthcare provider to get one. Talk to your pediatrician regarding the use of this medicine in children. Special care may be needed. Overdosage: If you think you have taken too much of this medicine contact a poison control center or emergency room at once. NOTE: This medicine is only for  you. Do not share this medicine with others. What if I miss a dose? If you miss a dose, take it as soon as you can. If it is almost time for your next dose, take only that dose. Do not take double or extra doses. What may interact with this medicine? Interactions are not expected. This list may not describe all possible interactions. Give your health care provider a list of all the medicines, herbs, non-prescription drugs, or dietary supplements you use. Also tell them if you smoke, drink alcohol, or use illegal drugs. Some items may interact with your medicine. What should I watch for while using this medicine? Tell your doctor or healthcare professional if your symptoms do not start to get better or if they get worse. What side effects may I notice from receiving this medicine? Side effects that you should report to your doctor or health care professional as soon as possible:  allergic reactions like skin rash, itching or hives, swelling of the face, lips, or tongue Side effects that usually do not require medical attention (report these to your doctor or health care professional if they continue or are bothersome):  pain, redness, or irritation at site where injected This list may not describe all possible side effects. Call your doctor for medical advice about side effects. You may report side  effects to FDA at 1-800-FDA-1088. Where should I keep my medicine? Keep out of the reach of children. You will be instructed on how to store this medicine. Throw away any unused medicine after the expiration date on the label. NOTE: This sheet is a summary. It may not cover all possible information. If you have questions about this medicine, talk to your doctor, pharmacist, or health care provider.  2021 Elsevier/Gold Standard (2016-10-13 17:22:56)

## 2020-03-08 NOTE — Progress Notes (Signed)
PATIENT: [name] [name] DOB: 19-Jan-1956  REASON FOR VISIT: follow up HISTORY FROM: patient  HISTORY OF PRESENT ILLNESS: Today 03/08/20:  [name] [name] is a 65 year old female with a history of intractable migraine headaches.  She returns today for follow-up.  She states that she has a dull headache every day.  She states last month she had to severe migraine episodes.  Her headaches are always located on the left side.  Associated with photophobia, phonophobia nausea and vomiting.  She states that her migraine headaches can sometimes last 3 to 4 days.  The intensity tends to improve over those 3 days.  She has tried several medications in the past including Effexor, Topamax, Inderal, nortriptyline, Imitrex, Maxalt, Botox, Aimovig, Depakote, magnesium, riboflavin, gabapentin.  09/05/19: [name] [name] is a 65 year old female with a history of intractable migraine headaches.  She returns today for follow-up.  She remains on Topamax.  She states that her headache frequency has increased.  She states that she has approximately 20 headache days out of the month.  She states that her migraine is still on the left side.  She states that she has been having sharp shooting pains with her headaches.  In the past she has tried Aimovig as well as multiple oral medications.  I have ordered Ajovy for her but she never tried it in the past.   Reports that she is also been having mammograms for possible breast cancer?  Reports that she has not had a biopsy yet.  HISTORY 09/01/18:  [name] [name] is a 65 year old female with intractable migraine headaches.  She returns today for follow-up.  At the last visit she was switched to [employer].  She reports that she never started this medication.  She states that she has "so many problems" that she did not want to take any "additional medication."  Currently she feels that her headaches are under control.  She states that she is actually gone a month between her headache  episodes.  She feels that Topamax is beneficial.  She returns today for follow-up.   REVIEW OF SYSTEMS: Out of a complete 14 system review of symptoms, the patient complains only of the following symptoms, and all other reviewed systems are negative.  See HPI  ALLERGIES: Allergies  Allergen Reactions  . Iohexol      Desc: throat selling resp distress hives.premedicate pt.entered 07/06/04, bsw.     HOME MEDICATIONS: Outpatient Medications Prior to Visit  Medication Sig Dispense Refill  . aspirin 81 MG tablet Take 81 mg by mouth daily.    Marland Kitchen CINNAMON PO Take 1,000 mg by mouth daily.     . Coenzyme Q10 (CO Q-10) 200 MG CAPS Take 1 capsule by mouth daily.      . famotidine (PEPCID) 20 MG tablet Take 2 tablets by mouth twice daily 360 tablet 0  . fish oil-omega-3 fatty acids 1000 MG capsule Take 1.2 g by mouth 2 (two) times daily.    . furosemide (LASIX) 20 MG tablet Take 1 tablet by mouth once daily 90 tablet 0  . Ginkgo Biloba 120 MG CAPS Take 1 capsule by mouth daily.      Marland Kitchen KEFLEX 250 MG capsule     . levothyroxine (SYNTHROID) 100 MCG tablet Take 100 mcg by mouth daily before breakfast.    . LIOTHYRONINE SODIUM PO 5 mg.     . Magnesium 250 MG TABS Take 250 mg by mouth daily.    . Multiple Vitamin (MULTIVITAMIN) capsule Take 1  capsule by mouth daily.    . nitroGLYCERIN (NITROSTAT) 0.4 MG SL tablet Place 1 tablet (0.4 mg total) under the tongue every 5 (five) minutes as needed. Pain 25 tablet 11  . pantoprazole (PROTONIX) 40 MG tablet Take 1 tablet by mouth twice daily. NEEDS MEDICARE WELLNESS 180 tablet 0  . potassium chloride SA (KLOR-CON) 20 MEQ tablet TAKE 2  BY MOUTH TWICE DAILY AND 1/2 (ONE-HALF) EVERY DAY AT BEDTIME 405 tablet 0  . propranolol ER (INDERAL LA) 60 MG 24 hr capsule TAKE 1 CAPSULE BY MOUTH ONCE DAILY AT BEDTIME. PLEASE SCHEDULE AN APPOINTMENT FOR FUTURE REFILLS 60 capsule 0  . Riboflavin 400 MG TABS Take 400 mg by mouth daily.    . simvastatin (ZOCOR) 40 MG tablet  TAKE 1 TABLET BY MOUTH AT BEDTIME 90 tablet 0  . topiramate (TOPAMAX) 100 MG tablet Take 1 tablet by mouth twice daily 180 tablet 3  . Vitamin D, Ergocalciferol, (DRISDOL) 1.25 MG (50000 UNIT) CAPS capsule Take 1 capsule by mouth once a week 4 capsule 0  . XIIDRA 5 % SOLN Apply 1 drop to eye 2 (two) times daily. 1 drop in each eye Twice daily    . metoprolol tartrate (LOPRESSOR) 25 MG tablet Take 0.5 tablets (12.5 mg total) by mouth every 4 (four) hours as needed (As needed for palpitations). 30 tablet 5  . levothyroxine (SYNTHROID, LEVOTHROID) 137 MCG tablet Take 137 mcg by mouth daily before breakfast. Pt takes 100 mcg daily     No facility-administered medications prior to visit.    PAST MEDICAL HISTORY: Past Medical History:  Diagnosis Date  . Anxiety   . Breast cancer ([place])    ER+/PR+/Her2-  . Concussion 05/26/14  . Depression   . Frequent UTI   . GERD (gastroesophageal reflux disease)   . Hypothyroid   . Microscopic hematuria   . Migraine   . Personal history of radiation therapy   . Radiation 03/22/14-04/19/14   Breast Cancer upper-outer  . Restless leg syndrome   . Seasonal allergies   . Stented coronary artery    LAD 95% 2007.  . Vitamin D deficiency     PAST SURGICAL HISTORY: Past Surgical History:  Procedure Laterality Date  . ABDOMINAL HYSTERECTOMY    . APPENDECTOMY    . back surgery  1997   herniated disc repair  . BREAST LUMPECTOMY Right    2016  . CARDIAC CATHETERIZATION  2007   stent  . CHOLECYSTECTOMY OPEN    . CYSTOSCOPY     neg  . RADIOACTIVE SEED GUIDED PARTIAL MASTECTOMY WITH AXILLARY SENTINEL LYMPH NODE BIOPSY Right 02/14/2014   Procedure: RADIOACTIVE SEED GUIDED PARTIAL MASTECTOMY WITH AXILLARY SENTINEL LYMPH NODE BIOPSY;  Surgeon: Erroll Luna, MD;  Location: [place];  Service: General;  Laterality: Right;  . SHOULDER SURGERY      FAMILY HISTORY: Family History  Problem Relation Age of Onset  . Coronary artery disease  Mother   . Thyroid disease Mother   . Alzheimer's disease Mother   . Diabetes Mother   . Heart disease Mother   . Cancer Brother        melnoma  . Ovarian cancer Paternal Aunt 32  . Bone cancer Paternal Uncle   . Alzheimer's disease Maternal Grandmother   . Heart disease Maternal Grandfather   . Pancreatic cancer Maternal Grandfather 41  . Ovarian cancer Maternal Aunt        dx in 69s  . Throat cancer Maternal Uncle  smoker  . Lung cancer Maternal Uncle        heavy smoker  . Ovarian cancer Paternal Aunt        dx in her 27s  . Cancer Cousin        "female cancer"  . Cancer Father     SOCIAL HISTORY: Social History   Socioeconomic History  . Marital status: Widowed    Spouse name: Not on file  . Number of children: 1  . Years of education: some coll.  . Highest education level: Not on file  Occupational History  . Occupation: not working  Tobacco Use  . Smoking status: Never Smoker  . Smokeless tobacco: Never Used  Vaping Use  . Vaping Use: Never used  Substance and Sexual Activity  . Alcohol use: No    Alcohol/week: 0.0 standard drinks    Comment: socially  . Drug use: No  . Sexual activity: Not Currently  Other Topics Concern  . Not on file  Social History Narrative   Does not get any regular exercise      Doctors   -uro-Ottlein   -cardio--hochrein   -Neurology--Dr. Jannifer Franklin   -allergies--Dr. Carmelina Peal   Gyn past--Dr. Warnell Forester   Couselor--Dr. Mitchum   Endo-- Dr. Chalmers Cater      Lives with son who is 71 years old   Patient is right handed.   Patient drinks 1-2 cups of caffeine daily.   Social Determinants of Health   Financial Resource Strain: Not on file  Food Insecurity: Not on file  Transportation Needs: Not on file  Physical Activity: Not on file  Stress: Not on file  Social Connections: Not on file  Intimate Partner Violence: Not on file      PHYSICAL EXAM  There were no vitals filed for this visit. There is no height or weight on  file to calculate BMI.  Generalized: Well developed, in no acute distress   Neurological examination  Mentation: Alert oriented to time, place, history taking. Follows all commands speech and language fluent Cranial nerve II-XII: Pupils were equal round reactive to light. Extraocular movements were full, visual field were full on confrontational test. Facial sensation and strength were normal. Uvula tongue midline. Head turning and shoulder shrug  were normal and symmetric. Motor: The motor testing reveals 5 over 5 strength of all 4 extremities. Good symmetric motor tone is noted throughout.  Sensory: Sensory testing is intact to soft touch on all 4 extremities. No evidence of extinction is noted.  Coordination: Cerebellar testing reveals good finger-nose-finger and heel-to-shin bilaterally.  Gait and station: Gait is normal. Tandem gait is normal. Romberg is negative. No drift is seen.  Reflexes: Deep tendon reflexes are symmetric and normal bilaterally.   DIAGNOSTIC DATA (LABS, IMAGING, TESTING) - I reviewed patient records, labs, notes, testing and imaging myself where available.  Lab Results  Component Value Date   WBC 6.8 09/12/2019   HGB 12.9 09/12/2019   HCT 37.7 09/12/2019   MCV 90.8 09/12/2019   PLT 173 09/12/2019      Component Value Date/Time   NA 139 09/12/2019 1228   NA 145 08/15/2016 1326   K 4.4 09/12/2019 1228   K 4.8 08/15/2016 1326   CL 112 (H) 09/12/2019 1228   CO2 20 (L) 09/12/2019 1228   CO2 22 08/15/2016 1326   GLUCOSE 103 (H) 09/12/2019 1228   GLUCOSE 129 08/15/2016 1326   BUN 19 09/12/2019 1228   BUN 15.4 08/15/2016 1326   CREATININE 1.51 (  H) 09/12/2019 1228   CREATININE 1.20 (H) 06/26/2017 1531   CREATININE 1.5 (H) 08/15/2016 1326   CALCIUM 10.3 09/12/2019 1228   CALCIUM 10.4 08/15/2016 1326   PROT 7.1 09/12/2019 1228   PROT 7.0 10/29/2018 1604   PROT 7.5 08/15/2016 1326   ALBUMIN 4.0 09/12/2019 1228   ALBUMIN 4.7 10/29/2018 1604   ALBUMIN 4.2  08/15/2016 1326   AST 34 09/12/2019 1228   AST 28 08/15/2016 1326   ALT 41 09/12/2019 1228   ALT 27 08/15/2016 1326   ALKPHOS 56 09/12/2019 1228   ALKPHOS 58 08/15/2016 1326   BILITOT 1.7 (H) 09/12/2019 1228   BILITOT 1.4 (H) 10/29/2018 1604   BILITOT 1.62 (H) 08/15/2016 1326   GFRNONAA 36 (L) 09/12/2019 1228   GFRAA 42 (L) 09/12/2019 1228   Lab Results  Component Value Date   CHOL 167 10/29/2018   HDL 50 10/29/2018   LDLCALC 89 10/29/2018   TRIG 164 (H) 10/29/2018   CHOLHDL 3.3 10/29/2018   Lab Results  Component Value Date   HGBA1C 5.7 (H) 06/26/2017   Lab Results  Component Value Date   VITAMINB12 329 12/11/2016   Lab Results  Component Value Date   TSH 0.23 (L) 06/26/2017      ASSESSMENT AND PLAN 65 y.o. year old female  has a past medical history of Anxiety, Breast cancer ([place]), Concussion (05/26/14), Depression, Frequent UTI, GERD (gastroesophageal reflux disease), Hypothyroid, Microscopic hematuria, Migraine, Personal history of radiation therapy, Radiation (03/22/14-04/19/14), Restless leg syndrome, Seasonal allergies, Stented coronary artery, and Vitamin D deficiency. here with :  1.  Migraine headaches   Continue Topamax  Start Ajovy injection monthly  Ubrelvy 50 mg at the onset of a migraine can repeat in 2 hours if needed for abortive therapy  MRI of the brain with and without contrast to evaluate for other causes of headaches.  Follow-up in 6 months or sooner if needed  I spent 30 minutes of face-to-face and non-face-to-face time with patient.  This included previsit chart review, lab review, study review, order entry, electronic health record documentation, patient education.  Ward Givens, MSN, NP-C 03/08/2020, 2:50 PM [facility] [address], [place], [place] [address] [phone]

## 2020-03-09 NOTE — Progress Notes (Signed)
I have read the note, and I agree with the clinical assessment and plan.  Charles K Willis   

## 2020-03-12 ENCOUNTER — Telehealth: Payer: Self-pay | Admitting: Adult Health

## 2020-03-12 NOTE — Telephone Encounter (Signed)
[employer] medicare/medicaid order sent to GI. No auth they will reach out to the patient to schedule.  °

## 2020-03-16 ENCOUNTER — Ambulatory Visit
Admission: RE | Admit: 2020-03-16 | Discharge: 2020-03-16 | Disposition: A | Discharge status: Home / Self Care | Payer: Medicare Other | Source: Ambulatory Visit | Attending: Adult Health | Admitting: Adult Health

## 2020-03-16 ENCOUNTER — Other Ambulatory Visit: Payer: Self-pay

## 2020-03-16 DIAGNOSIS — G43009 Migraine without aura, not intractable, without status migrainosus: Secondary | ICD-10-CM

## 2020-03-16 MED ORDER — GADOBENATE DIMEGLUMINE 529 MG/ML IV SOLN
20.0000 mL | Freq: Once | INTRAVENOUS | Status: AC | PRN
  Administered 2020-03-16: 20 mL via INTRAVENOUS

## 2020-03-19 ENCOUNTER — Telehealth: Payer: Self-pay | Admitting: Nurse Practitioner

## 2020-03-19 NOTE — Telephone Encounter (Signed)
Left message that upcoming appointment has been changed to virtual per provider's request. Gave option to call back to reschedule if needed.

## 2020-03-21 ENCOUNTER — Telehealth: Payer: Self-pay | Admitting: *Deleted

## 2020-03-21 NOTE — Telephone Encounter (Signed)
Left patient a detailed message, with results, on voicemail (ok per DPR).  Provided our number to call back with any questions.  

## 2020-03-21 NOTE — Telephone Encounter (Signed)
-----   Message from Ward Givens, NP sent at 03/21/2020 10:19 AM EST ----- No acute findings on MRI please call with any questions

## 2020-03-22 ENCOUNTER — Telehealth: Payer: Medicare Other | Admitting: Nurse Practitioner

## 2020-03-27 DIAGNOSIS — E890 Postprocedural hypothyroidism: Secondary | ICD-10-CM | POA: Diagnosis not present

## 2020-04-03 DIAGNOSIS — E559 Vitamin D deficiency, unspecified: Secondary | ICD-10-CM | POA: Diagnosis not present

## 2020-04-03 DIAGNOSIS — E890 Postprocedural hypothyroidism: Secondary | ICD-10-CM | POA: Diagnosis not present

## 2020-04-04 NOTE — Progress Notes (Signed)
[place]   [phone] [phone]   Clinic Follow up Note   Patient Care Team: Tower, Wynelle Fanny, MD as PCP - General Minus Breeding, MD as Consulting Physician (Cardiology) Holley Bouche, NP (Inactive) as Nurse Practitioner (Nurse Practitioner) Erroll Luna, MD as Consulting Physician (General Surgery) Thea Silversmith, MD as Consulting Physician (Radiation Oncology) Truitt Merle, MD as Consulting Physician (Hematology) Susa Day, MD as Consulting Physician (Orthopedic Surgery)  Date of Service:  04/06/2020  CHIEF COMPLAINT: F/u of right breast cancer  SUMMARY OF ONCOLOGIC HISTORY: Oncology History Overview Note  Cancer Staging Breast cancer of upper-outer quadrant of right female breast [facility]) Staging form: Breast, AJCC 7th Edition - Clinical stage from 01/04/2014: Stage IA (T1b, N0, M0) - Unsigned - Pathologic stage from 02/14/2014: Stage IA (T1b, N0, cM0) - Unsigned     Breast cancer of upper-outer quadrant of right female breast ([place])  12/29/2013 Imaging   Screening mammogram and [place]: an irregular shadowing hypoechoic mass right breast 9 o'clock location 4 cm from the nipple measuring 7 x 7 x 7 mm. No adenopathy    12/29/2013 Initial Diagnosis   Breast cancer of upper-outer quadrant of right female breast   12/29/2013 Initial Biopsy   Grade I-II IDC, ER+ (98%), PR+ (81%), HER2- (ratio 1.24), Ki67 15%    01/11/2014 Procedure   Genetic counseling/testing: Revealed 1 VUS in ATM gene, p.D1641H (c.4921G>C).  Otherwise, genetic testing negative.    02/14/2014 Surgery   Right lumpectomy with SLNB ([place]): Grade 2 IDC spanning 0.9 cm with associated grade 2 DCIS.  Negative margins. 5 sentinel lymph nodes negative.  HER2 repeated and neg. (ratio 1.16).   02/14/2014 Pathologic Stage   pT1bpN0; Stage IA   02/14/2014 Oncotype testing   Recurrence score 21 (13% risk of distant recurrence); no adjuvant chemo offered.    03/22/2014 -  04/19/2014 Radiation Therapy   Adjuvant radiation Pablo Ledger); Right breast: Total dose 42.72 Gy over 21 fractions. Right breast boost: Total dose 10 Gy over 5 fractions.     Anti-estrogen oral therapy   Anti-estrogen therapy was recommended by Dr. Burr Medico; pt declines anti-estrogen treatment.     05/25/2014 Survivorship   Survivorship Care Plan given to patient and reviewed with her in-person.    05/30/2016 Imaging   US Abdomen 05/30/16 IMPRESSION: Status post cholecystectomy. Pancreas not visualized due to overlying bowel gas. Increased echogenicity of hepatic parenchyma is noted diffusely suggesting fatty infiltration or other diffuse hepatocellular disease.   07/02/2016 Imaging   DEXA 07/02/16 T score of -1.8, indicating osteopenia   12/15/2016 Mammogram   IMPRESSION: 1. No mammographic evidence of malignancy in either breast. 2. Stable right breast post lumpectomy changes.      CURRENT THERAPY:  Surveillance  INTERVAL HISTORY:  [name] [name] is here for a follow up of right breast cancer. She was last seen by me in 08/2018 and seen by NP Lacie 6 months ago in interim. She presents to the clinic alone. She notes having a lot of stress in her life. She notes having migraines often. She required brain scan on 03/16/20. She notes she is seen by Dr Jannifer Franklin. She notes she was suppose to be on Xiidra, but has not received it although on her medication list along with Ajovy shot. She is also no Topamax. She notes her insurance if effecting her starting her medication. She notes having upper abdominal discomfort and her PCP Dr Alba Cory was to do a upper endoscopy. She notes this has  not been done. She was also suppose to do colonoscopy but was sent a cologaurd instead. She notes her last breast biopsy was very painful. She notes she has not seen Dr Brantley Stage in some time.     REVIEW OF SYSTEMS:   Constitutional: Denies fevers, chills or abnormal weight loss (+) Migraines  Eyes: Denies blurriness  of vision Ears, nose, mouth, throat, and face: Denies mucositis or sore throat Respiratory: Denies cough, dyspnea or wheezes Cardiovascular: Denies palpitation, chest discomfort or lower extremity swelling Gastrointestinal:  Denies nausea, heartburn or change in bowel habits (+) upper abdominal discomfort.  Skin: Denies abnormal skin rashes Lymphatics: Denies new lymphadenopathy or easy bruising Neurological:Denies numbness, tingling or new weaknesses Behavioral/Psych: Mood is stable, no new changes  All other systems were reviewed with the patient and are negative.  MEDICAL HISTORY:  Past Medical History:  Diagnosis Date  . Anxiety   . Breast cancer ([place])    ER+/PR+/Her2-  . Concussion 05/26/14  . Depression   . Frequent UTI   . GERD (gastroesophageal reflux disease)   . Hypothyroid   . Microscopic hematuria   . Migraine   . Personal history of radiation therapy   . Radiation 03/22/14-04/19/14   Breast Cancer upper-outer  . Restless leg syndrome   . Seasonal allergies   . Stented coronary artery    LAD 95% 2007.  . Vitamin D deficiency     SURGICAL HISTORY: Past Surgical History:  Procedure Laterality Date  . ABDOMINAL HYSTERECTOMY    . APPENDECTOMY    . back surgery  1997   herniated disc repair  . BREAST LUMPECTOMY Right    2016  . CARDIAC CATHETERIZATION  2007   stent  . CHOLECYSTECTOMY OPEN    . CYSTOSCOPY     neg  . RADIOACTIVE SEED GUIDED PARTIAL MASTECTOMY WITH AXILLARY SENTINEL LYMPH NODE BIOPSY Right 02/14/2014   Procedure: RADIOACTIVE SEED GUIDED PARTIAL MASTECTOMY WITH AXILLARY SENTINEL LYMPH NODE BIOPSY;  Surgeon: Erroll Luna, MD;  Location: [place];  Service: General;  Laterality: Right;  . SHOULDER SURGERY      I have reviewed the social history and family history with the patient and they are unchanged from previous note.  ALLERGIES:  is allergic to iohexol.  MEDICATIONS:  Current Outpatient Medications  Medication Sig  Dispense Refill  . aspirin 81 MG tablet Take 81 mg by mouth daily.    Marland Kitchen CINNAMON PO Take 1,000 mg by mouth daily.     . Coenzyme Q10 (CO Q-10) 200 MG CAPS Take 1 capsule by mouth daily.      . famotidine (PEPCID) 20 MG tablet Take 2 tablets by mouth twice daily 360 tablet 0  . fish oil-omega-3 fatty acids 1000 MG capsule Take 1.2 g by mouth 2 (two) times daily.    . Fremanezumab-vfrm (AJOVY) 225 MG/1.5ML SOAJ Inject 225 mg into the skin every 30 (thirty) days. 1.68 mL 5  . furosemide (LASIX) 20 MG tablet Take 1 tablet by mouth once daily 90 tablet 0  . Ginkgo Biloba 120 MG CAPS Take 1 capsule by mouth daily.      Marland Kitchen KEFLEX 250 MG capsule     . levothyroxine (SYNTHROID) 100 MCG tablet Take 100 mcg by mouth daily before breakfast.    . LIOTHYRONINE SODIUM PO 5 mg.     . Magnesium 250 MG TABS Take 250 mg by mouth daily.    . metoprolol tartrate (LOPRESSOR) 25 MG tablet Take 0.5 tablets (12.5 mg  total) by mouth every 4 (four) hours as needed (As needed for palpitations). 30 tablet 5  . Multiple Vitamin (MULTIVITAMIN) capsule Take 1 capsule by mouth daily.    . nitroGLYCERIN (NITROSTAT) 0.4 MG SL tablet Place 1 tablet (0.4 mg total) under the tongue every 5 (five) minutes as needed. Pain 25 tablet 11  . pantoprazole (PROTONIX) 40 MG tablet Take 1 tablet by mouth twice daily. NEEDS MEDICARE WELLNESS 180 tablet 0  . potassium chloride SA (KLOR-CON) 20 MEQ tablet TAKE 2  BY MOUTH TWICE DAILY AND 1/2 (ONE-HALF) EVERY DAY AT BEDTIME 405 tablet 0  . propranolol ER (INDERAL LA) 60 MG 24 hr capsule TAKE 1 CAPSULE BY MOUTH ONCE DAILY AT BEDTIME. PLEASE SCHEDULE AN APPOINTMENT FOR FUTURE REFILLS 60 capsule 0  . Riboflavin 400 MG TABS Take 400 mg by mouth daily.    . simvastatin (ZOCOR) 40 MG tablet TAKE 1 TABLET BY MOUTH AT BEDTIME 90 tablet 0  . topiramate (TOPAMAX) 100 MG tablet Take 1 tablet by mouth twice daily 180 tablet 3  . Ubrogepant (UBRELVY) 50 MG TABS Take 1 tablet at the onset of a migraine. Repeat  in 2 hours if needed. Not to exceed 2 tablets in 24 hours 10 tablet 5  . Vitamin D, Ergocalciferol, (DRISDOL) 1.25 MG (50000 UNIT) CAPS capsule Take 1 capsule by mouth once a week 4 capsule 0  . XIIDRA 5 % SOLN Apply 1 drop to eye 2 (two) times daily. 1 drop in each eye Twice daily     No current facility-administered medications for this visit.    PHYSICAL EXAMINATION: ECOG PERFORMANCE STATUS: 1 - Symptomatic but completely ambulatory  Vitals:   04/06/20 1454  BP: (!) 110/56  Pulse: (!) 59  Resp: 16  Temp: 97.6 F (36.4 C)  SpO2: 99%   Filed Weights   04/06/20 1454  Weight: 201 lb 1.6 oz (91.2 kg)    GENERAL:alert, no distress and comfortable SKIN: skin color, texture, turgor are normal, no rashes or significant lesions EYES: normal, Conjunctiva are pink and non-injected, sclera clear  NECK: supple, thyroid normal size, non-tender, without nodularity LYMPH:  no palpable lymphadenopathy in the cervical, axillary  LUNGS: clear to auscultation and percussion with normal breathing effort HEART: regular rate & rhythm and no murmurs and no lower extremity edema ABDOMEN:abdomen soft, non-tender and normal bowel sounds Musculoskeletal:no cyanosis of digits and no clubbing (+) mild Mid back tenderness with palpation. NEURO: alert & oriented x 3 with fluent speech, no focal motor/sensory deficits BREAST: S/p right lumpectomy: Surgical incision healed well with mild scar tissue and tenderness. No palpable mass, nodules or adenopathy bilaterally. Breast exam benign.   LABORATORY DATA:  I have reviewed the data as listed CBC Latest Ref Rng & Units 04/06/2020 09/12/2019 09/10/2018  WBC 4.0 - 10.5 K/uL 5.8 6.8 4.7  Hemoglobin 12.0 - 15.0 g/dL 13.2 12.9 13.0  Hematocrit 36.0 - 46.0 % 39.6 37.7 38.3  Platelets 150 - 400 K/uL 171 173 158     CMP Latest Ref Rng & Units 04/06/2020 09/12/2019 10/29/2018  Glucose 70 - 99 mg/dL 131(H) 103(H) -  BUN 8 - 23 mg/dL 20 19 -  Creatinine 0.44 - 1.00  mg/dL 1.43(H) 1.51(H) -  Sodium 135 - 145 mmol/L 143 139 -  Potassium 3.5 - 5.1 mmol/L 4.5 4.4 -  Chloride 98 - 111 mmol/L 113(H) 112(H) -  CO2 22 - 32 mmol/L 22 20(L) -  Calcium 8.9 - 10.3 mg/dL 9.7 10.3 -  Total Protein 6.5 - 8.1 g/dL 7.3 7.1 7.0  Total Bilirubin 0.3 - 1.2 mg/dL 1.6(H) 1.7(H) 1.4(H)  Alkaline Phos 38 - 126 U/L 56 56 67  AST 15 - 41 U/L 36 34 41(H)  ALT 0 - 44 U/L 40 41 42(H)      RADIOGRAPHIC STUDIES: I have personally reviewed the radiological images as listed and agreed with the findings in the report. No results found.   ASSESSMENT & PLAN:  [name] [name] is a 65 y.o. female with   1. pT1b N0 M0 stage IA invasive ductal carcinoma of right breast, grade II, ER/PR strongly positive, HER-2 negative, Oncotype RS 21 -She was diagnosed in 12/2013.She is status postright breastlumpectomy,adjuvant breast radiation.She declined adjuvantantiestrogentherapy, due to the concern of side effects. She is currently on Surveillance.  -From a breast cancer standpoint, She is clinically doing well. Lab reviewed, her CBC and CMP are within normal limits except mildly elevated tbil and Cr. Her physical exam and her 11/2019 mammogram were unremarkable. There is no clinical concern for recurrence. -I encouraged her to monitor her mid back tenderness from exam today.  -Continue Surveillance.Next mammogram in 11/2020 -F/u yearly. Will continue to complete 10 year surveillance.   2. Left breast and rib pain  -She wasn't aware of this pain until 08/2019 breast exam with NP Lacie, she attributed left breast heaviness to ongoing LUQ pain -Her f/u mammogram on 12/26/19 was normal but showed RUO breast calcs which were benign on 01/04/20 biopsy. She notes this biopsy was very painful.  -Overall left breast pain is manageable and likely from prior surgery.   2.Intermittent abdominalpain, nausea -She is on Protonix and Pepcid BID for GERD -Her last Endoscopy and colonoscopy  was 10 years ago.  -Her CT AP from 02/2018 was negative  -Given ongoing upper abdominal pain she is open to proceeding with endoscopy. She is also overdue for colonoscopy. I recommend she return to Dr Carlean Purl directly about endoscopy/colonoscopy, I will copy him. She is agreeable.   3. Osteopenia -last DEXA was in 06/2016, no high risk of fracture. DEXA 04/21/19 showed osteopenia T score -2.3 at left femur neck -ContinueCalcium and vitamin D supplements.  4. Mild hyperbilirubinemia  -She has had mild intermittent elevated total bilirubin since 01/2015, liver enzymes are normal -Stable overall.   5. CKD, Stage III -Continue to follow up with her PCP and she may need to be seen by a nephrologist in the future.  -Cr 1.43 today (04/06/20)  6. Migraines -She has had recurrent migraines. Her 03/16/20 MRI brain was benign.  -She is being followed by Dr Jannifer Franklin. She is on Topamax and was to start oral Xiidra and Ajovy injections but waiting on insurance to clear her.    Plan -I sent a message to Dr Carlean Purl about endoscopy/colonoscopy  -Mammogram in 11/22022 -Lab and F/u in 1 year with NP Lacie    No problem-specific Assessment & Plan notes found for this encounter.   Orders Placed This Encounter  Procedures  . MM Digital Screening    Standing Status:   Future    Standing Expiration Date:   04/06/2021    Order Specific Question:   Reason for Exam (SYMPTOM  OR DIAGNOSIS REQUIRED)    Answer:   screening    Order Specific Question:   Preferred imaging location?    Answer:   [facility] [place]   All questions were answered. The patient knows to call the clinic with any problems, questions or concerns. No barriers  to learning was detected. The total time spent in the appointment was 25 minutes.     Truitt Merle, MD 04/06/2020   I, Joslyn Devon, am acting as scribe for Truitt Merle, MD.   I have reviewed the above documentation for accuracy and completeness, and I agree with the above.

## 2020-04-06 ENCOUNTER — Other Ambulatory Visit: Payer: Self-pay

## 2020-04-06 ENCOUNTER — Inpatient Hospital Stay: Payer: Medicare Other | Attending: Nurse Practitioner | Admitting: Hematology

## 2020-04-06 ENCOUNTER — Encounter: Payer: Self-pay | Admitting: Hematology

## 2020-04-06 ENCOUNTER — Other Ambulatory Visit: Payer: Self-pay | Admitting: Cardiology

## 2020-04-06 ENCOUNTER — Inpatient Hospital Stay: Payer: Medicare Other

## 2020-04-06 ENCOUNTER — Telehealth: Payer: Self-pay | Admitting: Hematology

## 2020-04-06 VITALS — BP 110/56 | HR 59 | Temp 97.6°F | Resp 16 | Ht 68.0 in | Wt 201.1 lb

## 2020-04-06 DIAGNOSIS — N183 Chronic kidney disease, stage 3 unspecified: Secondary | ICD-10-CM | POA: Diagnosis not present

## 2020-04-06 DIAGNOSIS — Z87440 Personal history of urinary (tract) infections: Secondary | ICD-10-CM | POA: Diagnosis not present

## 2020-04-06 DIAGNOSIS — Z9049 Acquired absence of other specified parts of digestive tract: Secondary | ICD-10-CM | POA: Diagnosis not present

## 2020-04-06 DIAGNOSIS — M85852 Other specified disorders of bone density and structure, left thigh: Secondary | ICD-10-CM | POA: Insufficient documentation

## 2020-04-06 DIAGNOSIS — N644 Mastodynia: Secondary | ICD-10-CM | POA: Diagnosis not present

## 2020-04-06 DIAGNOSIS — R1010 Upper abdominal pain, unspecified: Secondary | ICD-10-CM | POA: Insufficient documentation

## 2020-04-06 DIAGNOSIS — R0781 Pleurodynia: Secondary | ICD-10-CM | POA: Insufficient documentation

## 2020-04-06 DIAGNOSIS — Z1231 Encounter for screening mammogram for malignant neoplasm of breast: Secondary | ICD-10-CM

## 2020-04-06 DIAGNOSIS — R110 Nausea: Secondary | ICD-10-CM | POA: Insufficient documentation

## 2020-04-06 DIAGNOSIS — C50411 Malignant neoplasm of upper-outer quadrant of right female breast: Secondary | ICD-10-CM

## 2020-04-06 DIAGNOSIS — Z79899 Other long term (current) drug therapy: Secondary | ICD-10-CM | POA: Diagnosis not present

## 2020-04-06 DIAGNOSIS — E785 Hyperlipidemia, unspecified: Secondary | ICD-10-CM | POA: Diagnosis not present

## 2020-04-06 DIAGNOSIS — Z170 Estrogen receptor positive status [ER+]: Secondary | ICD-10-CM

## 2020-04-06 DIAGNOSIS — G43909 Migraine, unspecified, not intractable, without status migrainosus: Secondary | ICD-10-CM | POA: Insufficient documentation

## 2020-04-06 LAB — COMPREHENSIVE METABOLIC PANEL
ALT: 40 U/L (ref 0–44)
AST: 36 U/L (ref 15–41)
Albumin: 4.2 g/dL (ref 3.5–5.0)
Alkaline Phosphatase: 56 U/L (ref 38–126)
Anion gap: 8 (ref 5–15)
BUN: 20 mg/dL (ref 8–23)
CO2: 22 mmol/L (ref 22–32)
Calcium: 9.7 mg/dL (ref 8.9–10.3)
Chloride: 113 mmol/L — ABNORMAL HIGH (ref 98–111)
Creatinine, Ser: 1.43 mg/dL — ABNORMAL HIGH (ref 0.44–1.00)
GFR, Estimated: 41 mL/min — ABNORMAL LOW (ref >60)
Glucose, Bld: 131 mg/dL — ABNORMAL HIGH (ref 70–99)
Potassium: 4.5 mmol/L (ref 3.5–5.1)
Sodium: 143 mmol/L (ref 135–145)
Total Bilirubin: 1.6 mg/dL — ABNORMAL HIGH (ref 0.3–1.2)
Total Protein: 7.3 g/dL (ref 6.5–8.1)

## 2020-04-06 LAB — CBC WITH DIFFERENTIAL/PLATELET
Abs Immature Granulocytes: 0.01 10*3/uL (ref 0.00–0.07)
Basophils Absolute: 0 10*3/uL (ref 0.0–0.1)
Basophils Relative: 1 %
Eosinophils Absolute: 0.2 10*3/uL (ref 0.0–0.5)
Eosinophils Relative: 3 %
HCT: 39.6 % (ref 36.0–46.0)
Hemoglobin: 13.2 g/dL (ref 12.0–15.0)
Immature Granulocytes: 0 %
Lymphocytes Relative: 25 %
Lymphs Abs: 1.5 10*3/uL (ref 0.7–4.0)
MCH: 30.1 pg (ref 26.0–34.0)
MCHC: 33.3 g/dL (ref 30.0–36.0)
MCV: 90.2 fL (ref 80.0–100.0)
Monocytes Absolute: 0.7 10*3/uL (ref 0.1–1.0)
Monocytes Relative: 12 %
Neutro Abs: 3.5 10*3/uL (ref 1.7–7.7)
Neutrophils Relative %: 59 %
Platelets: 171 10*3/uL (ref 150–400)
RBC: 4.39 MIL/uL (ref 3.87–5.11)
RDW: 13.3 % (ref 11.5–15.5)
WBC: 5.8 10*3/uL (ref 4.0–10.5)
nRBC: 0 % (ref 0.0–0.2)

## 2020-04-06 NOTE — Telephone Encounter (Signed)
Scheduled per los. Declined printout  

## 2020-04-07 LAB — LIPID PANEL
Chol/HDL Ratio: 3 ratio (ref 0.0–4.4)
Cholesterol, Total: 140 mg/dL (ref 100–199)
HDL: 46 mg/dL (ref >39)
LDL Chol Calc (NIH): 68 mg/dL (ref 0–99)
Triglycerides: 150 mg/dL — ABNORMAL HIGH (ref 0–149)
VLDL Cholesterol Cal: 26 mg/dL (ref 5–40)

## 2020-04-10 ENCOUNTER — Other Ambulatory Visit: Payer: Self-pay | Admitting: Cardiology

## 2020-04-13 ENCOUNTER — Ambulatory Visit (INDEPENDENT_AMBULATORY_CARE_PROVIDER_SITE_OTHER): Payer: Medicare Other | Admitting: Internal Medicine

## 2020-04-13 ENCOUNTER — Encounter: Payer: Self-pay | Admitting: Internal Medicine

## 2020-04-13 ENCOUNTER — Other Ambulatory Visit: Payer: Self-pay

## 2020-04-13 VITALS — BP 124/60 | HR 64 | Ht 66.54 in | Wt 204.1 lb

## 2020-04-13 DIAGNOSIS — R1310 Dysphagia, unspecified: Secondary | ICD-10-CM | POA: Diagnosis not present

## 2020-04-13 DIAGNOSIS — Z9283 Personal history of failed moderate sedation: Secondary | ICD-10-CM | POA: Diagnosis not present

## 2020-04-13 DIAGNOSIS — R1012 Left upper quadrant pain: Secondary | ICD-10-CM

## 2020-04-13 DIAGNOSIS — Z1211 Encounter for screening for malignant neoplasm of colon: Secondary | ICD-10-CM

## 2020-04-13 NOTE — Patient Instructions (Signed)
You have been scheduled for an endoscopy and colonoscopy. Please follow the written instructions given to you at your visit today. Please pick up your prep supplies at the pharmacy within the next 1-3 days. If you use inhalers (even only as needed), please bring them with you on the day of your procedure.   Due to recent changes in healthcare laws, you may see the results of your imaging and laboratory studies on MyChart before your provider has had a chance to review them.  We understand that in some cases there may be results that are confusing or concerning to you. Not all laboratory results come back in the same time frame and the provider may be waiting for multiple results in order to interpret others.  Please give us 48 hours in order for your provider to thoroughly review all the results before contacting the office for clarification of your results.    I appreciate the opportunity to care for you. Carl Gessner, MD, FACG 

## 2020-04-13 NOTE — Progress Notes (Signed)
[name] [name] 65 y.o. 1955/11/16 [id number] Referred by: Tower, Wynelle Fanny, MD  Assessment & Plan:   Encounter Diagnoses  Name Primary?  Marland Kitchen Dysphagia, unspecified type Yes  . LUQ pain   . Colon cancer screening   . Personal history of failed moderate sedation     Evaluate dysphagia and left upper quadrant pain that occurs despite twice daily PPI and H2 blocker use, with EGD.  Screening colonoscopy.  The risks and benefits as well as alternatives of endoscopic procedure(s) have been discussed and reviewed. All questions answered. The patient agrees to proceed.   She was reassured that deep sedation with propofol should provide a better experience than previous endoscopic procedure sedation.  I appreciate the opportunity to care for this patient. CC: Tower, Wynelle Fanny, MD  EGD colon   Subjective:   Chief Complaint: Dysphagia and left upper quadrant pain  HPI The patient is a 65 year old white woman with a history of breast cancer, hypothyroidism, restless leg syndrome and GERD.  She has been having problems with the left upper quadrant pain under her ribs and breast.  Sometimes she has had some substernal chest pain as well.  Usually in the morning some nausea the pain is often sharp.  At times she has felt not necessarily dizzy but "off-center".  She thinks food may help this symptom at times.  She saw Dr. Glori Bickers who suggested she stay on her GERD medications.  Pantoprazole 40 mg twice daily.  There is also at least a few month history of intermittent suprasternal sticking point impact dysphagia with "anything "that is to say solids and liquids.  The symptoms are episodic and unpredictable.  She reports having 2 prior attempts at upper endoscopy at [facility] and said that both times the procedures were unsuccessful and that the doctors made her feel like it was her fault that it was very embarrassing.  These were performed or attempted with moderate sedation.  Wt Readings from  Last 3 Encounters:  04/13/20 204 lb 2 oz (92.6 kg)  04/06/20 201 lb 1.6 oz (91.2 kg)  11/24/19 198 lb 12.8 oz (90.2 kg)    Allergies  Allergen Reactions  . Iohexol      Desc: throat selling resp distress hives.premedicate pt.entered 07/06/04, bsw.    Current Meds  Medication Sig  . aspirin 81 MG tablet Take 81 mg by mouth daily.  Marland Kitchen CINNAMON PO Take 1,000 mg by mouth daily.   . Coenzyme Q10 (CO Q-10) 200 MG CAPS Take 1 capsule by mouth daily.    . famotidine (PEPCID) 20 MG tablet Take 2 tablets by mouth twice daily  . fish oil-omega-3 fatty acids 1000 MG capsule Take 1.2 g by mouth 2 (two) times daily.  . furosemide (LASIX) 20 MG tablet Take 1 tablet by mouth once daily  . Ginkgo Biloba 120 MG CAPS Take 1 capsule by mouth daily.    Marland Kitchen KEFLEX 250 MG capsule   . levothyroxine (SYNTHROID) 100 MCG tablet Take 100 mcg by mouth daily before breakfast.  . LIOTHYRONINE SODIUM PO 5 mg.   . Magnesium 250 MG TABS Take 250 mg by mouth daily.  . Multiple Vitamin (MULTIVITAMIN) capsule Take 1 capsule by mouth daily.  . pantoprazole (PROTONIX) 40 MG tablet Take 1 tablet by mouth twice daily. NEEDS MEDICARE WELLNESS  . potassium chloride SA (KLOR-CON) 20 MEQ tablet TAKE 2  BY MOUTH TWICE DAILY AND 1/2 (ONE-HALF) EVERY DAY AT BEDTIME  . propranolol ER (INDERAL LA) 60 MG  24 hr capsule TAKE 1 CAPSULE BY MOUTH AT BEDTIME . APPOINTMENT REQUIRED FOR FUTURE REFILLS  . Riboflavin 400 MG TABS Take 400 mg by mouth daily.  . simvastatin (ZOCOR) 40 MG tablet TAKE 1 TABLET BY MOUTH AT BEDTIME  . topiramate (TOPAMAX) 100 MG tablet Take 1 tablet by mouth twice daily  . Vitamin D, Ergocalciferol, (DRISDOL) 1.25 MG (50000 UNIT) CAPS capsule Take 1 capsule by mouth once a week   Past Medical History:  Diagnosis Date  . Anxiety   . Breast cancer ([place])    ER+/PR+/Her2-  . Concussion 05/26/14  . Depression   . Frequent UTI   . GERD (gastroesophageal reflux disease)   . Hypothyroid   . Microscopic hematuria   .  Migraine   . Personal history of radiation therapy   . Radiation 03/22/14-04/19/14   Breast Cancer upper-outer  . Restless leg syndrome   . Seasonal allergies   . Stented coronary artery    LAD 95% 2007.  . Vitamin D deficiency    Past Surgical History:  Procedure Laterality Date  . ABDOMINAL HYSTERECTOMY    . APPENDECTOMY    . back surgery  1997   herniated disc repair  . BREAST LUMPECTOMY Right    2016  . CARDIAC CATHETERIZATION  2007   stent  . CHOLECYSTECTOMY OPEN    . CYSTOSCOPY     neg  . RADIOACTIVE SEED GUIDED PARTIAL MASTECTOMY WITH AXILLARY SENTINEL LYMPH NODE BIOPSY Right 02/14/2014   Procedure: RADIOACTIVE SEED GUIDED PARTIAL MASTECTOMY WITH AXILLARY SENTINEL LYMPH NODE BIOPSY;  Surgeon: Erroll Luna, MD;  Location: [facility];  Service: General;  Laterality: Right;  . SHOULDER SURGERY Right    Social History   Social History Narrative   Does not get any regular exercise      Doctors   -uro-[facility]   -cardio--hochrein   -Neurology--Dr. Jannifer Franklin   -allergies--Dr. Carmelina Peal   Gyn past--Dr. Warnell Forester   Couselor--Dr. Mitchum   Endo-- Dr. Chalmers Cater      Lives with son who is 55 years old   Patient is right handed.   Patient drinks 1-2 cups of caffeine daily.   family history includes Alzheimer's disease in her maternal grandmother and mother; Bone cancer in her paternal uncle; Cancer in her cousin; Coronary artery disease in her mother; Diabetes in her mother; Heart disease in her maternal grandfather and mother; Lung cancer in her maternal uncle; Melanoma in her brother and father; Ovarian cancer in her maternal aunt and paternal aunt; Ovarian cancer (age of onset: 59) in her paternal aunt; Pancreatic cancer (age of onset: 105) in her maternal grandfather; Throat cancer in her maternal uncle; Thyroid disease in her mother.   Review of Systems As per HPI  Objective:   Physical Exam _0  124/60 (BP Location: Left Arm, Patient Position: Sitting, Cuff Size:  Normal)   Pulse 64   Ht 5' 6.54" (1.69 m) Comment: height measured without shoes  Wt 204 lb 2 oz (92.6 kg)   BMI 32.42 kg/m @  General:  Well-developed, obese, ww well-nourished and in no acute distress Eyes:  anicteric. Neck:   supple w/o thyromegaly or mass.  Lungs: Clear to auscultation bilaterally. Heart:  S1S2, no rubs, murmurs, gallops. Abdomen:  obese soft,  Mildly tender LUQ, no hepatosplenomegaly, hernia, or mass and BS+.  Rectal: deferred Lymph:  no cervical or supraclavicular adenopathy. Extremities:   no edema, cyanosis or clubbing Skin   no rash. Neuro:  A&O x 3.  Data Reviewed: See HPI

## 2020-04-16 ENCOUNTER — Telehealth: Payer: Self-pay

## 2020-04-16 NOTE — Telephone Encounter (Signed)
PA for 50 mg [name] [name] has been sent to [employer] via cover my meds (Key: [id number])  Your information has been sent to [employer].

## 2020-04-17 NOTE — Telephone Encounter (Signed)
(  Key: [id number])  This request has received a Favorable outcome.  Please note any additional information provided by [employer] at the bottom of your screen.  equest Reference Number: [id number]. UBRELVY TAB 50MG  is approved through 01/26/2021.

## 2020-04-18 ENCOUNTER — Telehealth: Payer: Self-pay | Admitting: *Deleted

## 2020-04-18 NOTE — Telephone Encounter (Signed)
Ajovy PA, key: [id number], g43.009, failed:  Effexor, Topamax, Inderal, nortriptyline, Imitrex, Maxalt, Botox, Aimovig, Depakote, magnesium, riboflavin, gabapentin. Your information has been sent to [employer].

## 2020-04-19 ENCOUNTER — Other Ambulatory Visit: Payer: Self-pay | Admitting: Cardiology

## 2020-04-19 ENCOUNTER — Telehealth: Payer: Self-pay | Admitting: *Deleted

## 2020-04-19 ENCOUNTER — Encounter: Payer: Self-pay | Admitting: *Deleted

## 2020-04-19 MED ORDER — EMGALITY 120 MG/ML ~~LOC~~ SOAJ: 120.0000 mg | SUBCUTANEOUS | 11 refills | Status: DC

## 2020-04-19 NOTE — Addendum Note (Signed)
Addended by: Minna Antis on: 04/19/2020 07:15 AM   Modules accepted: Orders

## 2020-04-19 NOTE — Telephone Encounter (Signed)
Ajovy denied, NP ordered Emgality. Pa on CMM, key: [id number]. G43.009.  Your information has been sent to [employer].

## 2020-04-19 NOTE — Telephone Encounter (Signed)
Emgality order sent

## 2020-04-19 NOTE — Telephone Encounter (Signed)
Emgality approved through 01/26/21. Faxed approval letter to pharmacy. Sent my chart to advise patient.

## 2020-04-19 NOTE — Addendum Note (Signed)
Addended by: Trudie Buckler on: 04/19/2020 08:39 AM   Modules accepted: Orders

## 2020-04-19 NOTE — Telephone Encounter (Signed)
Ajovy autoinjector is denied because it is not on your plan's Drug List (formulary). Medication authorization requires the following: (1) You need to try this covered drug: Emgality*; OR (2) your doctor needs to give [place] specific medical reasons why the covered drug is not  appropriatefor you. Sent to NP.

## 2020-04-24 ENCOUNTER — Other Ambulatory Visit: Payer: Self-pay | Admitting: Family Medicine

## 2020-04-30 ENCOUNTER — Other Ambulatory Visit: Payer: Self-pay | Admitting: Family Medicine

## 2020-05-04 DIAGNOSIS — R300 Dysuria: Secondary | ICD-10-CM | POA: Diagnosis not present

## 2020-05-04 DIAGNOSIS — N3020 Other chronic cystitis without hematuria: Secondary | ICD-10-CM | POA: Diagnosis not present

## 2020-05-04 DIAGNOSIS — R8271 Bacteriuria: Secondary | ICD-10-CM | POA: Diagnosis not present

## 2020-06-07 ENCOUNTER — Other Ambulatory Visit: Payer: Self-pay | Admitting: Family Medicine

## 2020-06-07 ENCOUNTER — Other Ambulatory Visit: Payer: Self-pay | Admitting: Cardiology

## 2020-06-08 ENCOUNTER — Other Ambulatory Visit: Payer: Self-pay | Admitting: Family Medicine

## 2020-06-11 ENCOUNTER — Other Ambulatory Visit: Payer: Self-pay | Admitting: Family Medicine

## 2020-06-13 ENCOUNTER — Other Ambulatory Visit: Payer: Self-pay | Admitting: Family Medicine

## 2020-06-15 ENCOUNTER — Encounter: Payer: Self-pay | Admitting: Family Medicine

## 2020-06-19 ENCOUNTER — Other Ambulatory Visit: Payer: Self-pay

## 2020-06-19 ENCOUNTER — Encounter: Payer: Self-pay | Admitting: Internal Medicine

## 2020-06-19 ENCOUNTER — Ambulatory Visit (AMBULATORY_SURGERY_CENTER): Payer: Medicare Other | Admitting: Internal Medicine

## 2020-06-19 VITALS — BP 124/55 | HR 51 | Temp 96.9°F | Resp 14 | Ht 66.0 in | Wt 204.0 lb

## 2020-06-19 DIAGNOSIS — D124 Benign neoplasm of descending colon: Secondary | ICD-10-CM | POA: Diagnosis not present

## 2020-06-19 DIAGNOSIS — Z1211 Encounter for screening for malignant neoplasm of colon: Secondary | ICD-10-CM

## 2020-06-19 DIAGNOSIS — Z86010 Personal history of colonic polyps: Secondary | ICD-10-CM | POA: Diagnosis not present

## 2020-06-19 DIAGNOSIS — R1310 Dysphagia, unspecified: Secondary | ICD-10-CM

## 2020-06-19 MED ORDER — SODIUM CHLORIDE 0.9 % IV SOLN: 500.0000 mL | Freq: Once | INTRAVENOUS | Status: DC

## 2020-06-19 NOTE — Patient Instructions (Addendum)
I found and removed one tiny colon polyp. It was not recovered to send to pathology but it was tiny and I believe benign (from experience of looking at thousands of polyps.  The esophagus, stomach and upper intestine looked normal - and I dilated the esophagus. I hope this helps you swallow better.  If you continue to have abdominal pain and swallowing problems please contact me   I appreciate the opportunity to care for you. Gatha Mayer, MD, [facility]   Nothing by mouth for one hour until 5:00 pm, clear liquids until 6:00 pm, soft foods for the rest of today.  Tomorrow you may resume normal diet.  YOU HAD AN ENDOSCOPIC PROCEDURE TODAY AT [facility]:   Refer to the procedure report that was given to you for any specific questions about what was found during the examination.  If the procedure report does not answer your questions, please call your gastroenterologist to clarify.  If you requested that your care partner not be given the details of your procedure findings, then the procedure report has been included in a sealed envelope for you to review at your convenience later.  YOU SHOULD EXPECT: Some feelings of bloating in the abdomen. Passage of more gas than usual.  Walking can help get rid of the air that was put into your GI tract during the procedure and reduce the bloating. If you had a lower endoscopy (such as a colonoscopy or flexible sigmoidoscopy) you may notice spotting of blood in your stool or on the toilet paper. If you underwent a bowel prep for your procedure, you may not have a normal bowel movement for a few days.  Please Note:  You might notice some irritation and congestion in your nose or some drainage.  This is from the oxygen used during your procedure.  There is no need for concern and it should clear up in a day or so.  SYMPTOMS TO REPORT IMMEDIATELY:   Following lower endoscopy (colonoscopy or flexible sigmoidoscopy):  Excessive amounts of blood in  the stool  Significant tenderness or worsening of abdominal pains  Swelling of the abdomen that is new, acute  Fever of 100F or higher   Following upper endoscopy (EGD)  Vomiting of blood or coffee ground material  New chest pain or pain under the shoulder blades  Painful or persistently difficult swallowing  New shortness of breath  Fever of 100F or higher  Black, tarry-looking stools  For urgent or emergent issues, a gastroenterologist can be reached at any hour by calling [phone]. Do not use MyChart messaging for urgent concerns.    DIET:  Nothing by mouth until 5:00 pm, clear liquids until 6:00 pm, soft foods for the rest of today. Tomorrow  you may proceed to your regular diet.  Drink plenty of fluids but you should avoid alcoholic beverages for 24 hours.  ACTIVITY:  You should plan to take it easy for the rest of today and you should NOT DRIVE or use heavy machinery until tomorrow (because of the sedation medicines used during the test).    FOLLOW UP: Our staff will call the number listed on your records 48-72 hours following your procedure to check on you and address any questions or concerns that you may have regarding the information given to you following your procedure. If we do not reach you, we will leave a message.  We will attempt to reach you two times.  During this call, we will ask  if you have developed any symptoms of COVID 19. If you develop any symptoms (ie: fever, flu-like symptoms, shortness of breath, cough etc.) before then, please call [phone].  If you test positive for Covid 19 in the 2 weeks post procedure, please call and report this information to [place].    If any biopsies were taken you will be contacted by phone or by letter within the next 1-3 weeks.  Please call us at [phone] if you have not heard about the biopsies in 3 weeks.    SIGNATURES/CONFIDENTIALITY: You and/or your care partner have signed paperwork which will be entered into  your electronic medical record.  These signatures attest to the fact that that the information above on your After Visit Summary has been reviewed and is understood.  Full responsibility of the confidentiality of this discharge information lies with you and/or your care-partner.

## 2020-06-19 NOTE — Progress Notes (Signed)
Pt's states no medical or surgical changes since previsit or office visit. 

## 2020-06-19 NOTE — Progress Notes (Signed)
To PACU, VSS. Report to [email or web address] 

## 2020-06-19 NOTE — Progress Notes (Signed)
C.W. vital signs. 

## 2020-06-19 NOTE — Op Note (Signed)
[facility] Patient Name: [name] [name] Procedure Date: 06/19/2020 3:22 PM MRN: [id number] Endoscopist: Gatha Mayer , MD Age: 65 Referring MD:  Date of Birth: [birth year] Gender: Female Account #: [id number] Procedure:                Colonoscopy Indications:              Screening for colorectal malignant neoplasm Medicines:                Propofol per Anesthesia, Monitored Anesthesia Care Procedure:                Pre-Anesthesia Assessment:                           - Prior to the procedure, a History and Physical                            was performed, and patient medications and                            allergies were reviewed. The patient's tolerance of                            previous anesthesia was also reviewed. The risks                            and benefits of the procedure and the sedation                            options and risks were discussed with the patient.                            All questions were answered, and informed consent                            was obtained. Prior Anticoagulants: The patient has                            taken no previous anticoagulant or antiplatelet                            agents. ASA Grade Assessment: III - A patient with                            severe systemic disease. After reviewing the risks                            and benefits, the patient was deemed in                            satisfactory condition to undergo the procedure.                           After obtaining informed consent, the colonoscope  was passed under direct vision. Throughout the                            procedure, the patient's blood pressure, pulse, and                            oxygen saturations were monitored continuously. The                            Olympus CF-[id number]) Colonoscope was                            introduced through the anus and advanced to the the                             cecum, identified by appendiceal orifice and                            ileocecal valve. The colonoscopy was performed                            without difficulty. The patient tolerated the                            procedure well. The quality of the bowel                            preparation was good. The bowel preparation used                            was Miralax via split dose instruction. The                            ileocecal valve, appendiceal orifice, and rectum                            were photographed. Scope In: 3:43:30 PM Scope Out: 3:53:18 PM Scope Withdrawal Time: 0 hours 7 minutes 2 seconds  Total Procedure Duration: 0 hours 9 minutes 48 seconds  Findings:                 The perianal and digital rectal examinations were                            normal.                           A 3 mm polyp was found in the descending colon. The                            polyp was sessile. The polyp was removed with a                            cold snare. Resection was complete, but the polyp  tissue was not retrieved. Verification of patient                            identification for the specimen was done. Estimated                            blood loss was minimal.                           The exam was otherwise without abnormality on                            direct and retroflexion views. Complications:            No immediate complications. Estimated Blood Loss:     Estimated blood loss was minimal. Impression:               - One 3 mm polyp in the descending colon, removed                            with a cold snare. Complete resection. Polyp tissue                            not retrieved.                           - The examination was otherwise normal on direct                            and retroflexion views. Recommendation:           - Patient has a contact number available for                            emergencies. The  signs and symptoms of potential                            delayed complications were discussed with the                            patient. Return to normal activities tomorrow.                            Written discharge instructions were provided to the                            patient.                           - Resume previous diet.                           - Continue present medications.                           - Repeat colonoscopy in 10 years. Gatha Mayer, MD 06/19/2020 4:06:57 PM This report has  been signed electronically.

## 2020-06-19 NOTE — Op Note (Signed)
[facility] Patient Name: [name] [name] Procedure Date: 06/19/2020 3:23 PM MRN: [id number] Endoscopist: Gatha Mayer , MD Age: 65 Referring MD:  Date of Birth: Aug 19, 1955 Gender: Female Account #: [id number] Procedure:                Upper GI endoscopy Indications:              Dysphagia Medicines:                Propofol per Anesthesia, Monitored Anesthesia Care Procedure:                Pre-Anesthesia Assessment:                           - Prior to the procedure, a History and Physical                            was performed, and patient medications and                            allergies were reviewed. The patient's tolerance of                            previous anesthesia was also reviewed. The risks                            and benefits of the procedure and the sedation                            options and risks were discussed with the patient.                            All questions were answered, and informed consent                            was obtained. Prior Anticoagulants: The patient has                            taken no previous anticoagulant or antiplatelet                            agents. ASA Grade Assessment: III - A patient with                            severe systemic disease. After reviewing the risks                            and benefits, the patient was deemed in                            satisfactory condition to undergo the procedure.                           After obtaining informed consent, the endoscope was  passed under direct vision. Throughout the                            procedure, the patient's blood pressure, pulse, and                            oxygen saturations were monitored continuously. The                            Endoscope was introduced through the mouth, and                            advanced to the second part of duodenum. The upper                            GI endoscopy was  accomplished without difficulty.                            The patient tolerated the procedure well. Scope In: Scope Out: Findings:                 No endoscopic abnormality was evident in the                            esophagus to explain the patient's complaint of                            dysphagia. It was decided, however, to proceed with                            dilation of the entire esophagus. The scope was                            withdrawn. Dilation was performed with a Maloney                            dilator with mild resistance at 72 Fr. The dilation                            site was examined following endoscope reinsertion                            and showed no change. Estimated blood loss: none.                           The exam was otherwise without abnormality.                           The cardia and gastric fundus were normal on                            retroflexion. Complications:            No immediate complications. Estimated Blood Loss:     Estimated blood loss: none.  Impression:               - No endoscopic esophageal abnormality to explain                            patient's dysphagia. Esophagus dilated. Dilated.                           - The examination was otherwise normal.                           - No specimens collected. Recommendation:           - Patient has a contact number available for                            emergencies. The signs and symptoms of potential                            delayed complications were discussed with the                            patient. Return to normal activities tomorrow.                            Written discharge instructions were provided to the                            patient.                           - Clear liquids x 1 hour then soft foods rest of                            day. Start prior diet tomorrow.                           - Continue present medications.                            - See the other procedure note for documentation of                            additional recommendations.                           - If dysphagia and abdominal pain persist return to                            clinic Gatha Mayer, MD 06/19/2020 4:05:04 PM This report has been signed electronically.

## 2020-06-21 ENCOUNTER — Telehealth: Payer: Self-pay

## 2020-06-21 NOTE — Telephone Encounter (Signed)
Left message on answering machine. 

## 2020-07-18 ENCOUNTER — Other Ambulatory Visit: Payer: Self-pay

## 2020-07-18 ENCOUNTER — Ambulatory Visit (INDEPENDENT_AMBULATORY_CARE_PROVIDER_SITE_OTHER): Payer: Medicare Other | Admitting: Family Medicine

## 2020-07-18 ENCOUNTER — Other Ambulatory Visit (INDEPENDENT_AMBULATORY_CARE_PROVIDER_SITE_OTHER): Payer: Medicare Other

## 2020-07-18 DIAGNOSIS — E7800 Pure hypercholesterolemia, unspecified: Secondary | ICD-10-CM | POA: Diagnosis not present

## 2020-07-18 DIAGNOSIS — R7309 Other abnormal glucose: Secondary | ICD-10-CM | POA: Diagnosis not present

## 2020-07-18 DIAGNOSIS — N289 Disorder of kidney and ureter, unspecified: Secondary | ICD-10-CM

## 2020-07-18 DIAGNOSIS — E039 Hypothyroidism, unspecified: Secondary | ICD-10-CM

## 2020-07-18 DIAGNOSIS — E559 Vitamin D deficiency, unspecified: Secondary | ICD-10-CM | POA: Diagnosis not present

## 2020-07-18 MED ORDER — FAMOTIDINE 20 MG PO TABS: 40.0000 mg | ORAL_TABLET | Freq: Two times a day (BID) | ORAL | 0 refills | Status: DC

## 2020-07-18 MED ORDER — FUROSEMIDE 20 MG PO TABS: 20.0000 mg | ORAL_TABLET | Freq: Every day | ORAL | 0 refills | Status: DC

## 2020-07-18 MED ORDER — PANTOPRAZOLE SODIUM 40 MG PO TBEC: DELAYED_RELEASE_TABLET | ORAL | 0 refills | Status: DC

## 2020-07-18 MED ORDER — POTASSIUM CHLORIDE CRYS ER 20 MEQ PO TBCR: EXTENDED_RELEASE_TABLET | ORAL | 0 refills | Status: DC

## 2020-07-18 NOTE — Progress Notes (Signed)
   Subjective:       Review of Systems     Objective:   Physical Exam        Assessment & Plan:   

## 2020-07-18 NOTE — Telephone Encounter (Signed)
Patient in today for appt, not checked in correctly and ended up having to wait for nearly 1 hour before she questioned the front.    At this point, we were unable to work patient back in to the schedule. Dr. Glori Bickers aware and after apologizing and meeting with both patient and provider, I was able to work out an agreeable plan to meet the patients needs until we could get her back in with Dr. Glori Bickers.    Patient will go ahead and get blood work done today and Dr. Glori Bickers okay'ed refilling her prescriptions as she is running out. We, also, were going to reschedule her for this Friday at 430pm; however, patient stated she had a conflict and couldn't get back in until the week of 7/11.  Dr. Glori Bickers is off that week so we did get an agreed upon date and time for 08/15/20 at 4:00pm for her annual visit.   Will pend the 4 r/x's patient is needing for Dr. Marliss Coots approval.   Dr. Glori Bickers and I both (along with the front office ) staff extend our sincerest apologies for the error.

## 2020-07-19 LAB — CBC WITH DIFFERENTIAL/PLATELET
Basophils Absolute: 0.1 10*3/uL (ref 0.0–0.1)
Basophils Relative: 1.1 % (ref 0.0–3.0)
Eosinophils Absolute: 0.1 10*3/uL (ref 0.0–0.7)
Eosinophils Relative: 2.3 % (ref 0.0–5.0)
HCT: 38.6 % (ref 36.0–46.0)
Hemoglobin: 13.4 g/dL (ref 12.0–15.0)
Lymphocytes Relative: 33.2 % (ref 12.0–46.0)
Lymphs Abs: 2 10*3/uL (ref 0.7–4.0)
MCHC: 34.7 g/dL (ref 30.0–36.0)
MCV: 90 fl (ref 78.0–100.0)
Monocytes Absolute: 0.6 10*3/uL (ref 0.1–1.0)
Monocytes Relative: 9.5 % (ref 3.0–12.0)
Neutro Abs: 3.2 10*3/uL (ref 1.4–7.7)
Neutrophils Relative %: 53.9 % (ref 43.0–77.0)
Platelets: 207 10*3/uL (ref 150.0–400.0)
RBC: 4.29 Mil/uL (ref 3.87–5.11)
RDW: 13.8 % (ref 11.5–15.5)
WBC: 6 10*3/uL (ref 4.0–10.5)

## 2020-07-19 LAB — LIPID PANEL
Cholesterol: 125 mg/dL (ref 0–200)
HDL: 41.2 mg/dL (ref >39.00)
NonHDL: 83.79
Total CHOL/HDL Ratio: 3
Triglycerides: 274 mg/dL — ABNORMAL HIGH (ref 0.0–149.0)
VLDL: 54.8 mg/dL — ABNORMAL HIGH (ref 0.0–40.0)

## 2020-07-19 LAB — COMPREHENSIVE METABOLIC PANEL
ALT: 39 U/L — ABNORMAL HIGH (ref 0–35)
AST: 34 U/L (ref 0–37)
Albumin: 4.5 g/dL (ref 3.5–5.2)
Alkaline Phosphatase: 53 U/L (ref 39–117)
BUN: 23 mg/dL (ref 6–23)
CO2: 23 mEq/L (ref 19–32)
Calcium: 9.9 mg/dL (ref 8.4–10.5)
Chloride: 109 mEq/L (ref 96–112)
Creatinine, Ser: 1.41 mg/dL — ABNORMAL HIGH (ref 0.40–1.20)
GFR: 39.01 mL/min — ABNORMAL LOW (ref >60.00)
Glucose, Bld: 112 mg/dL — ABNORMAL HIGH (ref 70–99)
Potassium: 4.6 mEq/L (ref 3.5–5.1)
Sodium: 140 mEq/L (ref 135–145)
Total Bilirubin: 1.6 mg/dL — ABNORMAL HIGH (ref 0.2–1.2)
Total Protein: 6.9 g/dL (ref 6.0–8.3)

## 2020-07-19 LAB — LDL CHOLESTEROL, DIRECT: Direct LDL: 62 mg/dL

## 2020-07-19 LAB — HEMOGLOBIN A1C: Hgb A1c MFr Bld: 5.8 % (ref 4.6–6.5)

## 2020-07-19 LAB — TSH: TSH: 0.74 u[IU]/mL (ref 0.35–4.50)

## 2020-07-19 LAB — VITAMIN D 25 HYDROXY (VIT D DEFICIENCY, FRACTURES): VITD: 47.13 ng/mL (ref 30.00–100.00)

## 2020-07-24 DIAGNOSIS — H2513 Age-related nuclear cataract, bilateral: Secondary | ICD-10-CM | POA: Diagnosis not present

## 2020-08-15 ENCOUNTER — Ambulatory Visit (INDEPENDENT_AMBULATORY_CARE_PROVIDER_SITE_OTHER): Payer: Medicare Other | Admitting: Family Medicine

## 2020-08-15 ENCOUNTER — Encounter: Payer: Self-pay | Admitting: Family Medicine

## 2020-08-15 ENCOUNTER — Other Ambulatory Visit: Payer: Self-pay

## 2020-08-15 VITALS — BP 136/78 | HR 65 | Temp 98.0°F | Ht 66.5 in | Wt 202.4 lb

## 2020-08-15 DIAGNOSIS — E039 Hypothyroidism, unspecified: Secondary | ICD-10-CM

## 2020-08-15 DIAGNOSIS — Z6832 Body mass index (BMI) 32.0-32.9, adult: Secondary | ICD-10-CM

## 2020-08-15 DIAGNOSIS — N390 Urinary tract infection, site not specified: Secondary | ICD-10-CM

## 2020-08-15 DIAGNOSIS — Z0000 Encounter for general adult medical examination without abnormal findings: Secondary | ICD-10-CM | POA: Diagnosis not present

## 2020-08-15 DIAGNOSIS — R7309 Other abnormal glucose: Secondary | ICD-10-CM | POA: Diagnosis not present

## 2020-08-15 DIAGNOSIS — M85851 Other specified disorders of bone density and structure, right thigh: Secondary | ICD-10-CM

## 2020-08-15 DIAGNOSIS — N289 Disorder of kidney and ureter, unspecified: Secondary | ICD-10-CM

## 2020-08-15 DIAGNOSIS — E6609 Other obesity due to excess calories: Secondary | ICD-10-CM

## 2020-08-15 DIAGNOSIS — K130 Diseases of lips: Secondary | ICD-10-CM | POA: Diagnosis not present

## 2020-08-15 DIAGNOSIS — Z23 Encounter for immunization: Secondary | ICD-10-CM | POA: Diagnosis not present

## 2020-08-15 DIAGNOSIS — E559 Vitamin D deficiency, unspecified: Secondary | ICD-10-CM

## 2020-08-15 DIAGNOSIS — E7800 Pure hypercholesterolemia, unspecified: Secondary | ICD-10-CM

## 2020-08-15 MED ORDER — NYSTATIN 100000 UNIT/GM EX OINT: 1.0000 "application " | TOPICAL_OINTMENT | Freq: Two times a day (BID) | CUTANEOUS | 1 refills | Status: DC

## 2020-08-15 NOTE — Assessment & Plan Note (Signed)
Discussed how this problem influences overall health and the risks it imposes  Reviewed plan for weight loss with lower calorie diet (via better food choices and also portion control or program like weight watchers) and exercise building up to or more than 30 minutes 5 days per week including some aerobic activity    

## 2020-08-15 NOTE — Assessment & Plan Note (Signed)
Lab Results  Component Value Date   HGBA1C 5.8 07/18/2020   disc imp of low glycemic diet and wt loss to prevent DM2

## 2020-08-15 NOTE — Assessment & Plan Note (Signed)
Vitamin D level is therapeutic with current supplementation Disc importance of this to bone and overall health Level of 47 In setting of osteopenia

## 2020-08-15 NOTE — Assessment & Plan Note (Signed)
Pt continues keflex and urology f/u  Has known renal insuff

## 2020-08-15 NOTE — Assessment & Plan Note (Signed)
Disc goals for lipids and reasons to control them Rev last labs with pt Rev low sat fat diet in detail Taking simvastatin 40 mg daily  Watches diet most of the time Trig up poss from sugar intake-watching  Continues cardiology care

## 2020-08-15 NOTE — Assessment & Plan Note (Signed)
Reviewed health habits including diet and exercise and skin cancer prevention Reviewed appropriate screening tests for age  Also reviewed health mt list, fam hx and immunization status , as well as social and family history   See HPI Labs reviewed  Colonoscopy and mammogram utd  Personal h/o breast cancer utd oncol care  prevnar 20 vaccine today  covid vaccinated Enc to consider shingrix No cognitive concerns dexa 3/21 no falls or fx and D level is tx  Enc to get more exercise/suggested chair yoga utd with hearing/eye/vision care  Has hearing aides

## 2020-08-15 NOTE — Assessment & Plan Note (Signed)
Lab Results  Component Value Date   TSH 0.74 07/18/2020   No clinica changes Under care of endocrinologist  Takes both levothyroxine and liothyronine

## 2020-08-15 NOTE — Assessment & Plan Note (Signed)
Pt never did see nephrology after referral in 2020  Ref placed today  Cr stabel at 1.41 with GFR of 39 Good water intake Chronic uti on keflex

## 2020-08-15 NOTE — Progress Notes (Signed)
Subjective:    Patient ID: [name] [name], female    DOB: [birth year], 65 y.o.   MRN: [id number]  This visit occurred during the SARS-CoV-2 public health emergency.  Safety protocols were in place, including screening questions prior to the visit, additional usage of staff PPE, and extensive cleaning of exam room while observing appropriate contact time as indicated for disinfecting solutions.   HPI Pt presents for amw and health mt exam   I have personally reviewed the Medicare Annual Wellness questionnaire and have noted 1. The patient's medical and social history 2. Their use of alcohol, tobacco or illicit drugs 3. Their current medications and supplements 4. The patient's functional ability including ADL's, fall risks, home safety risks and hearing or visual             impairment. 5. Diet and physical activities 6. Evidence for depression or mood disorders  The patients weight, height, BMI have been recorded in the chart and visual acuity is per eye clinic.  I have made referrals, counseling and provided education to the patient based review of the above and I have provided the pt with a written personalized care plan for preventive services. Reviewed and updated provider list, see scanned forms.  See scanned forms.  Routine anticipatory guidance given to patient.  See health maintenance. Colon cancer screening  colonoscopy 5/22 Breast cancer screening  mammogram 11/21 Had to have a bx which was normal  She sees her oncol  Personal h/o breast cancer  Self breast exam-no lumps  Flu vaccine-gets in the fall  Tetanus vaccine  1/08- def for ins Pneumovax-has not had  Covid vaccinated  Zoster vaccine- interested in shingrix maybe later   Advance directive-does not have Cognitive function addressed- see scanned forms- and if abnormal then additional documentation follows.  No new memory problems but mood fluctuates Dexa 3/21 Osteopenia Falls -none  Fractures-none   Supplements  D level is 47 Exercise - not much/unable to do much  No much outdoor time/ afraid of ticks  PMH and SH reviewed  Meds, vitals, and allergies reviewed.   ROS: See HPI.  Otherwise negative.    Weight  Wt Readings from Last 3 Encounters:  08/15/20 202 lb 6 oz (91.8 kg)  06/19/20 204 lb (92.5 kg)  04/13/20 204 lb 2 oz (92.6 kg)   32.17 kg/m   Vision/hearing  Hearing Screening - Comments:: Pt wears hearing aids Vision Screening - Comments:: Eye exam 07/2020 at [facility] team  Adeyemi Hamad-pcp [facility]- gen surg Feng-hematology [facility]-ortho   H/o CAD BP Readings from Last 3 Encounters:  08/15/20 136/78  06/19/20 (!) 124/55  04/13/20 124/60   Pulse Readings from Last 3 Encounters:  08/15/20 65  06/19/20 (!) 51  04/13/20 64    Takes metoprolol prn and propranolol ER   Hypothyroidism  Pt has no clinical changes No change in energy level/ hair or skin/ edema and no tremor Lab Results  Component Value Date   TSH 0.74 07/18/2020    Levothyroxine 100 mcg daily  Sees endocrinology and doing ok for now    Mood -up and down  Nothing really big in terms of changes  Stays in hiding from covid   She lost 3 cats from covid - and that was tough    Elevated glucose Lab Results  Component Value Date   HGBA1C 5.8 07/18/2020  Lots of family history of diabetes  Avoids sodas  Lots of water  Does not use sugar  Occ uses stevia    Hyperlipidemia Lab Results  Component Value Date   CHOL 125 07/18/2020   CHOL 140 04/06/2020   CHOL 167 10/29/2018   Lab Results  Component Value Date   HDL 41.20 07/18/2020   HDL 46 04/06/2020   HDL 50 10/29/2018   Lab Results  Component Value Date   LDLCALC 68 04/06/2020   LDLCALC 89 10/29/2018   LDLCALC 74 06/26/2017   Lab Results  Component Value Date   TRIG 274.0 (H) 07/18/2020   TRIG 150 (H) 04/06/2020   TRIG 164 (H) 10/29/2018   Lab Results  Component Value Date   CHOLHDL 3 07/18/2020    CHOLHDL 3.0 04/06/2020   CHOLHDL 3.3 10/29/2018   Lab Results  Component Value Date   LDLDIRECT 62.0 07/18/2020   Simvastatin 40 mg daily  Tries to avoid excessive fatty foods    Renal insuff Lab Results  Component Value Date   CREATININE 1.41 (H) 07/18/2020   BUN 23 07/18/2020   NA 140 07/18/2020   K 4.6 07/18/2020   CL 109 07/18/2020   CO2 23 07/18/2020   Has not seen nephrology as planned  Good water intake  Has chronic uti and takes keflex   Patient Active Problem List   Diagnosis Date Noted   Angular cheilitis 08/15/2020   Elevated glucose 07/18/2020   Medicare annual wellness visit, subsequent 11/11/2018   Hearing loss 11/11/2018   Palpitation 04/21/2017   Coronary artery disease involving native coronary artery of native heart without angina pectoris 04/21/2017   Positive ANA (antinuclear antibody) 12/15/2016   Fatigue 12/11/2016   Estrogen deficiency 06/24/2016   Osteopenia 05/18/2016   Genetic testing 01/25/2014   Breast cancer of upper-outer quadrant of right female breast ([place]) 12/30/2013   Hypothyroid 12/10/2011   Adverse effects of medication 10/28/2010   Routine general medical examination at a health care facility 09/30/2010   PERIODIC LIMB MOVEMENT DISORDER 08/16/2008   HYPERSOMNIA 06/21/2008   Somnolence, daytime 06/07/2008   Vitamin D deficiency 04/07/2008   Chronic UTI 11/05/2007   Renal insufficiency 09/16/2007   Other abnormal glucose 09/16/2007   HEMATURIA, MICROSCOPIC, HX OF 09/16/2007   OTHER ABNORMAL BLOOD CHEMISTRY 09/09/2007   Hyperlipidemia 08/05/2007   Obesity 08/05/2007   DEPRESSIVE DISORDER 08/05/2007   Intractable chronic migraine without aura 08/05/2007   Coronary atherosclerosis 08/05/2007   GERD 08/05/2007   DEGENERATIVE DISC DISEASE, LUMBAR SPINE 08/05/2007   EDEMA 08/05/2007   Past Medical History:  Diagnosis Date   Anxiety    Breast cancer ([place])    ER+/PR+/Her2-   Concussion 05/26/14   Depression    Frequent UTI     GERD (gastroesophageal reflux disease)    Hypothyroid    Microscopic hematuria    Migraine    Personal history of radiation therapy    Radiation 03/22/14-04/19/14   Breast Cancer upper-outer   Restless leg syndrome    Seasonal allergies    Stented coronary artery    LAD 95% 2007.   Vitamin D deficiency    Past Surgical History:  Procedure Laterality Date   ABDOMINAL HYSTERECTOMY     APPENDECTOMY     back surgery  1997   herniated disc repair   BREAST LUMPECTOMY Right    2016   CARDIAC CATHETERIZATION  2007   stent   CHOLECYSTECTOMY OPEN     CYSTOSCOPY     neg   RADIOACTIVE SEED GUIDED PARTIAL MASTECTOMY WITH AXILLARY SENTINEL LYMPH NODE BIOPSY Right 02/14/2014  Procedure: RADIOACTIVE SEED GUIDED PARTIAL MASTECTOMY WITH AXILLARY SENTINEL LYMPH NODE BIOPSY;  Surgeon: Erroll Luna, MD;  Location: [facility];  Service: General;  Laterality: Right;   SHOULDER SURGERY Right    Social History   Tobacco Use   Smoking status: Never   Smokeless tobacco: Never  Vaping Use   Vaping Use: Never used  Substance Use Topics   Alcohol use: No    Alcohol/week: 0.0 standard drinks    Comment: socially   Drug use: No   Family History  Problem Relation Age of Onset   Coronary artery disease Mother    Thyroid disease Mother    Alzheimer's disease Mother    Diabetes Mother    Heart disease Mother    Melanoma Brother    Ovarian cancer Paternal Aunt 66   Bone cancer Paternal Uncle    Alzheimer's disease Maternal Grandmother    Heart disease Maternal Grandfather    Pancreatic cancer Maternal Grandfather 51   Ovarian cancer Maternal Aunt        dx in 60s   Throat cancer Maternal Uncle        smoker   Lung cancer Maternal Uncle        heavy smoker   Ovarian cancer Paternal Aunt        dx in her 46s   Cancer Cousin        "female cancer"   Melanoma Father    Allergies  Allergen Reactions   Iohexol      Desc: throat selling resp distress hives.premedicate  pt.entered 07/06/04, bsw.    Current Outpatient Medications on File Prior to Visit  Medication Sig Dispense Refill   aspirin 81 MG tablet Take 81 mg by mouth daily.     CINNAMON PO Take 1,000 mg by mouth daily.      Coenzyme Q10 (CO Q-10) 200 MG CAPS Take 1 capsule by mouth daily.       famotidine (PEPCID) 20 MG tablet Take 2 tablets (40 mg total) by mouth 2 (two) times daily. 360 tablet 0   fish oil-omega-3 fatty acids 1000 MG capsule Take 1.2 g by mouth 2 (two) times daily.     furosemide (LASIX) 20 MG tablet Take 1 tablet (20 mg total) by mouth daily. 90 tablet 0   Galcanezumab-gnlm (EMGALITY) 120 MG/ML SOAJ Inject 120 mg into the skin every 30 (thirty) days. Loading dose of 2 pens (2 mL) 1st month, 1 pen thereafter every 30 days 2 mL 11   Ginkgo Biloba 120 MG CAPS Take 1 capsule by mouth daily.       KEFLEX 250 MG capsule      levothyroxine (SYNTHROID) 100 MCG tablet Take 100 mcg by mouth daily before breakfast.     LIOTHYRONINE SODIUM PO 5 mg.      Magnesium 250 MG TABS Take 250 mg by mouth daily.     metoprolol succinate (TOPROL-XL) 25 MG 24 hr tablet 1 tablet     Multiple Vitamin (MULTIVITAMIN) capsule Take 1 capsule by mouth daily.     nitroGLYCERIN (NITROSTAT) 0.4 MG SL tablet Place 1 tablet (0.4 mg total) under the tongue every 5 (five) minutes as needed. Pain 25 tablet 11   pantoprazole (PROTONIX) 40 MG tablet Take 1 tablet by mouth twice daily. NEEDS MEDICARE WELLNESS 180 tablet 0   potassium chloride SA (KLOR-CON) 20 MEQ tablet TAKE 2  BY MOUTH TWICE DAILY AND 1/2 (ONE-HALF) EVERY DAY AT BEDTIME 405 tablet 0  propranolol ER (INDERAL LA) 60 MG 24 hr capsule Take 1 capsule by mouth once daily at bedtime 90 capsule 3   Riboflavin 400 MG TABS Take 400 mg by mouth daily.     simvastatin (ZOCOR) 40 MG tablet TAKE 1 TABLET BY MOUTH AT BEDTIME 90 tablet 2   topiramate (TOPAMAX) 100 MG tablet Take 1 tablet by mouth twice daily 180 tablet 3   Vitamin D, Ergocalciferol, (DRISDOL) 1.25 MG  (50000 UNIT) CAPS capsule Take 1 capsule by mouth once a week 4 capsule 0   metoprolol tartrate (LOPRESSOR) 25 MG tablet Take 0.5 tablets (12.5 mg total) by mouth every 4 (four) hours as needed (As needed for palpitations). 30 tablet 5   Ubrogepant (UBRELVY) 50 MG TABS Take 1 tablet at the onset of a migraine. Repeat in 2 hours if needed. Not to exceed 2 tablets in 24 hours (Patient not taking: Reported on 08/15/2020) 10 tablet 5   No current facility-administered medications on file prior to visit.     Review of Systems  Constitutional:  Positive for fatigue. Negative for activity change, appetite change, fever and unexpected weight change.  HENT:  Negative for congestion, ear pain, rhinorrhea, sinus pressure and sore throat.   Eyes:  Negative for pain, redness and visual disturbance.  Respiratory:  Negative for cough, shortness of breath and wheezing.   Cardiovascular:  Negative for chest pain and palpitations.  Gastrointestinal:  Negative for abdominal pain, blood in stool, constipation and diarrhea.  Endocrine: Negative for polydipsia and polyuria.  Genitourinary:  Negative for dysuria, frequency and urgency.  Musculoskeletal:  Positive for arthralgias. Negative for back pain and myalgias.  Skin:  Negative for pallor and rash.       Dry cracking skin at corners of mouth   Allergic/Immunologic: Negative for environmental allergies.  Neurological:  Negative for dizziness, syncope and headaches.  Hematological:  Negative for adenopathy. Does not bruise/bleed easily.  Psychiatric/Behavioral:  Negative for decreased concentration and dysphoric mood. The patient is not nervous/anxious.       Objective:   Physical Exam Constitutional:      General: She is not in acute distress.    Appearance: Normal appearance. She is well-developed. She is obese. She is not ill-appearing or diaphoretic.  HENT:     Head: Normocephalic and atraumatic.     Right Ear: Tympanic membrane, ear canal and  external ear normal.     Left Ear: Tympanic membrane, ear canal and external ear normal.     Nose: Nose normal. No congestion.     Mouth/Throat:     Mouth: Mucous membranes are moist.     Pharynx: Oropharynx is clear. No posterior oropharyngeal erythema.  Eyes:     General: No scleral icterus.    Extraocular Movements: Extraocular movements intact.     Conjunctiva/sclera: Conjunctivae normal.     Pupils: Pupils are equal, round, and reactive to light.  Neck:     Thyroid: No thyromegaly.     Vascular: No carotid bruit or JVD.  Cardiovascular:     Rate and Rhythm: Normal rate and regular rhythm.     Pulses: Normal pulses.     Heart sounds: Normal heart sounds.    No gallop.  Pulmonary:     Effort: Pulmonary effort is normal. No respiratory distress.     Breath sounds: Normal breath sounds. No wheezing.     Comments: Good air exch Chest:     Chest wall: No tenderness.  Abdominal:  General: Bowel sounds are normal. There is no distension or abdominal bruit.     Palpations: Abdomen is soft. There is no mass.     Tenderness: There is no abdominal tenderness.     Hernia: No hernia is present.  Genitourinary:    Comments: Breast exam recently done by oncology    Musculoskeletal:        General: No tenderness. Normal range of motion.     Cervical back: Normal range of motion and neck supple. No rigidity. No muscular tenderness.     Right lower leg: No edema.     Left lower leg: No edema.  Lymphadenopathy:     Cervical: No cervical adenopathy.  Skin:    General: Skin is warm and dry.     Coloration: Skin is not pale.     Findings: No erythema or rash.     Comments: Solar lentigines diffusely Fair complexion   Neurological:     Mental Status: She is alert. Mental status is at baseline.     Cranial Nerves: No cranial nerve deficit.     Motor: No abnormal muscle tone.     Coordination: Coordination normal.     Gait: Gait normal.     Deep Tendon Reflexes: Reflexes are normal  and symmetric. Reflexes normal.  Psychiatric:        Mood and Affect: Mood normal.        Cognition and Memory: Cognition and memory normal.          Assessment & Plan:   Problem List Items Addressed This Visit       Digestive   Angular cheilitis    Resembles yeast  This has happened after dental visits, may also be allergy to a chemical  Px nystatin cream to use on affected areas bid Update if not starting to improve in a week or if worsening           Endocrine   Hypothyroid    Lab Results  Component Value Date   TSH 0.74 07/18/2020  No clinica changes Under care of endocrinologist  Takes both levothyroxine and liothyronine         Musculoskeletal and Integument   Osteopenia    dexa 3/21 No falls or fractures  Takes vit D and level is therapeutic Enc more exercise  Can re check at 2 y         Genitourinary   Renal insufficiency    Pt never did see nephrology after referral in 2020  Ref placed today  Cr stabel at 1.41 with GFR of 39 Good water intake Chronic uti on keflex         Relevant Orders   Ambulatory referral to Nephrology   Chronic UTI    Pt continues keflex and urology f/u  Has known renal insuff       Relevant Medications   nystatin ointment (MYCOSTATIN)     Other   Vitamin D deficiency (Chronic)    Vitamin D level is therapeutic with current supplementation Disc importance of this to bone and overall health Level of 47 In setting of osteopenia        Hyperlipidemia    Disc goals for lipids and reasons to control them Rev last labs with pt Rev low sat fat diet in detail Taking simvastatin 40 mg daily  Watches diet most of the time Trig up poss from sugar intake-watching  Continues cardiology care       Obesity  Discussed how this problem influences overall health and the risks it imposes  Reviewed plan for weight loss with lower calorie diet (via better food choices and also portion control or program like weight  watchers) and exercise building up to or more than 30 minutes 5 days per week including some aerobic activity          Routine general medical examination at a health care facility    Reviewed health habits including diet and exercise and skin cancer prevention Reviewed appropriate screening tests for age  Also reviewed health mt list, fam hx and immunization status , as well as social and family history   See HPI Labs reviewed  Colonoscopy and mammogram utd  Personal h/o breast cancer utd oncol care  prevnar 20 vaccine today  covid vaccinated Enc to consider shingrix No cognitive concerns dexa 3/21 no falls or fx and D level is tx  Enc to get more exercise/suggested chair yoga utd with hearing/eye/vision care  Has hearing aides       Medicare annual wellness visit, subsequent - Primary    Reviewed health habits including diet and exercise and skin cancer prevention Reviewed appropriate screening tests for age  Also reviewed health mt list, fam hx and immunization status , as well as social and family history   See HPI Labs reviewed  Colonoscopy and mammogram utd  Personal h/o breast cancer utd oncol care  prevnar 20 vaccine today  covid vaccinated Enc to consider shingrix No cognitive concerns dexa 3/21 no falls or fx and D level is tx  Enc to get more exercise/suggested chair yoga utd with hearing/eye/vision care  Has hearing aides        Elevated glucose    Lab Results  Component Value Date   HGBA1C 5.8 07/18/2020  disc imp of low glycemic diet and wt loss to prevent DM2       Other Visit Diagnoses     Need for vaccination against Streptococcus pneumoniae       Relevant Orders   Pneumococcal conjugate vaccine 20-valent (Prevnar 20) (Completed)

## 2020-08-15 NOTE — Assessment & Plan Note (Signed)
Resembles yeast  This has happened after dental visits, may also be allergy to a chemical  Px nystatin cream to use on affected areas bid Update if not starting to improve in a week or if worsening

## 2020-08-15 NOTE — Assessment & Plan Note (Signed)
dexa 3/21 No falls or fractures  Takes vit D and level is therapeutic Enc more exercise  Can re check at 1 y

## 2020-08-15 NOTE — Patient Instructions (Addendum)
Pneumonia vaccine today   If you are interested in the new shingles vaccine (Shingrix) - call your local pharmacy to check on coverage and availability  If affordable, get on a wait list at your pharmacy to get the vaccine.  Here is material to work on advance directive   To prevent diabetes Try to get most of your carbohydrates from produce (with the exception of white potatoes)  Eat less bread/pasta/rice/snack foods/cereals/sweets and other items from the middle of the grocery store (processed carbs)  Chair yoga - there are on line programs to look for to do at home   I will place a nephrology referral so you will get a call   Use the nystatin ointment on lips twice daily  Let us know it it does not help

## 2020-09-26 ENCOUNTER — Encounter: Payer: Self-pay | Admitting: Neurology

## 2020-09-26 ENCOUNTER — Ambulatory Visit (INDEPENDENT_AMBULATORY_CARE_PROVIDER_SITE_OTHER): Payer: Medicare Other | Admitting: Neurology

## 2020-09-26 VITALS — BP 131/72 | HR 58 | Ht 68.0 in | Wt 203.0 lb

## 2020-09-26 DIAGNOSIS — G43719 Chronic migraine without aura, intractable, without status migrainosus: Secondary | ICD-10-CM | POA: Diagnosis not present

## 2020-09-26 MED ORDER — ONDANSETRON HCL 4 MG PO TABS: 4.0000 mg | ORAL_TABLET | Freq: Three times a day (TID) | ORAL | 3 refills | Status: DC | PRN

## 2020-09-26 MED ORDER — NURTEC 75 MG PO TBDP: 75.0000 mg | ORAL_TABLET | ORAL | 3 refills | Status: DC

## 2020-09-26 MED ORDER — TOPIRAMATE 100 MG PO TABS: 100.0000 mg | ORAL_TABLET | Freq: Two times a day (BID) | ORAL | 3 refills | Status: DC

## 2020-09-26 NOTE — Patient Instructions (Signed)
We will start Nurtec 75 mg every other day for migraine prevention.

## 2020-09-26 NOTE — Progress Notes (Signed)
Reason for visit: Intractable migraine headache  [name] [name] is an 65 y.o. female  History of present illness:  [name] is a 65 year old right-handed white female with a history of intractable migraine headache.  The patient has been on multiple medications in the past for her migraine.  She recently was placed on Ajovy, but only took an injection or 2 and stopped the medication because she said it made her slightly dizzy for 2 to 3 days after the injection.  She was on Ubrelvy as a rescue drug which did help some, but she claims that Aleve does just as well.  She does have 4 or 5 days with headache a week, she may have 1 or 2 severe headaches a month that may be more incapacitating associated with nausea vomiting, photophobia and phonophobia.  Her headaches usually last all day long.  She returns to the office today for further evaluation.  Her headaches are usually worse in the cooler months of the year.  Past Medical History:  Diagnosis Date   Anxiety    Breast cancer ([place])    ER+/PR+/Her2-   Concussion 05/26/14   Depression    Frequent UTI    GERD (gastroesophageal reflux disease)    Hypothyroid    Microscopic hematuria    Migraine    Personal history of radiation therapy    Radiation 03/22/14-04/19/14   Breast Cancer upper-outer   Restless leg syndrome    Seasonal allergies    Stented coronary artery    LAD 95% 2007.   Vitamin D deficiency     Past Surgical History:  Procedure Laterality Date   ABDOMINAL HYSTERECTOMY     APPENDECTOMY     back surgery  1997   herniated disc repair   BREAST LUMPECTOMY Right    2016   CARDIAC CATHETERIZATION  2007   stent   CHOLECYSTECTOMY OPEN     CYSTOSCOPY     neg   RADIOACTIVE SEED GUIDED PARTIAL MASTECTOMY WITH AXILLARY SENTINEL LYMPH NODE BIOPSY Right 02/14/2014   Procedure: RADIOACTIVE SEED GUIDED PARTIAL MASTECTOMY WITH AXILLARY SENTINEL LYMPH NODE BIOPSY;  Surgeon: Erroll Luna, MD;  Location: [place];  Service: General;  Laterality: Right;   SHOULDER SURGERY Right     Family History  Problem Relation Age of Onset   Coronary artery disease Mother    Thyroid disease Mother    Alzheimer's disease Mother    Diabetes Mother    Heart disease Mother    Melanoma Brother    Ovarian cancer Paternal Aunt 53   Bone cancer Paternal Uncle    Alzheimer's disease Maternal Grandmother    Heart disease Maternal Grandfather    Pancreatic cancer Maternal Grandfather 56   Ovarian cancer Maternal Aunt        dx in 60s   Throat cancer Maternal Uncle        smoker   Lung cancer Maternal Uncle        heavy smoker   Ovarian cancer Paternal Aunt        dx in her 66s   Cancer Cousin        "female cancer"   Melanoma Father     Social history:  reports that she has never smoked. She has never used smokeless tobacco. She reports that she does not drink alcohol and does not use drugs.    Allergies  Allergen Reactions   Iohexol      Desc: throat selling resp distress  hives.premedicate pt.entered 07/06/04, bsw.     Medications:  Prior to Admission medications   Medication Sig Start Date End Date Taking? Authorizing Provider  aspirin 81 MG tablet Take 81 mg by mouth daily.   Yes [provider]  CINNAMON PO Take 1,000 mg by mouth daily.    Yes [provider]  Coenzyme Q10 (CO Q-10) 200 MG CAPS Take 1 capsule by mouth daily.     Yes [provider]  famotidine (PEPCID) 20 MG tablet Take 2 tablets (40 mg total) by mouth 2 (two) times daily. 07/18/20  Yes Tower, Wynelle Fanny, MD  fish oil-omega-3 fatty acids 1000 MG capsule Take 1.2 g by mouth 2 (two) times daily.   Yes [provider]  furosemide (LASIX) 20 MG tablet Take 1 tablet (20 mg total) by mouth daily. 07/18/20  Yes Tower, Wynelle Fanny, MD  Ginkgo Biloba 120 MG CAPS Take 1 capsule by mouth daily.     Yes [provider]  KEFLEX 250 MG capsule  03/17/18  Yes [provider]  levothyroxine  (SYNTHROID) 100 MCG tablet Take 100 mcg by mouth daily before breakfast.   Yes [provider]  LIOTHYRONINE SODIUM PO 5 mg.    Yes [provider]  Magnesium 250 MG TABS Take 250 mg by mouth daily.   Yes [provider]  Multiple Vitamin (MULTIVITAMIN) capsule Take 1 capsule by mouth daily.   Yes [provider]  nitroGLYCERIN (NITROSTAT) 0.4 MG SL tablet Place 1 tablet (0.4 mg total) under the tongue every 5 (five) minutes as needed. Pain 11/24/19  Yes Minus Breeding, MD  nystatin ointment (MYCOSTATIN) Apply 1 application topically 2 (two) times daily. To affected areas 08/15/20  Yes Tower, Roque Lias A, MD  pantoprazole (PROTONIX) 40 MG tablet Take 1 tablet by mouth twice daily. NEEDS MEDICARE WELLNESS 07/18/20  Yes Tower, Wynelle Fanny, MD  potassium chloride SA (KLOR-CON) 20 MEQ tablet TAKE 2  BY MOUTH TWICE DAILY AND 1/2 (ONE-HALF) EVERY DAY AT BEDTIME 07/18/20  Yes Tower, Wynelle Fanny, MD  propranolol ER (INDERAL LA) 60 MG 24 hr capsule Take 1 capsule by mouth once daily at bedtime 06/08/20  Yes Hochrein, Jeneen Rinks, MD  Riboflavin 400 MG TABS Take 400 mg by mouth daily.   Yes [provider]  simvastatin (ZOCOR) 40 MG tablet TAKE 1 TABLET BY MOUTH AT BEDTIME 04/19/20  Yes Minus Breeding, MD  topiramate (TOPAMAX) 100 MG tablet Take 1 tablet by mouth twice daily 11/28/19  Yes Ward Givens, NP    ROS:  Out of a complete 14 system review of symptoms, the patient complains only of the following symptoms, and all other reviewed systems are negative.  Headache Nausea, vomiting  Blood pressure 131/72, pulse (!) 58, height 5' 8" (1.727 m), weight 203 lb (92.1 kg).  Physical Exam  General: The patient is alert and cooperative at the time of the examination.  The patient is markedly obese.  Skin: No significant peripheral edema is noted.   Neurologic Exam  Mental status: The patient is alert and oriented x 3 at the time of the examination. The patient has apparent  normal recent and remote memory, with an apparently normal attention span and concentration ability.   Cranial nerves: Facial symmetry is present. Speech is normal, no aphasia or dysarthria is noted. Extraocular movements are full. Visual fields are full.  Motor: The patient has good strength in all 4 extremities.  Sensory examination: Soft touch sensation is symmetric on the  face, arms, and legs.  Coordination: The patient has good finger-nose-finger and heel-to-shin bilaterally.  Gait and station: The patient has a normal gait. Tandem gait is slightly unsteady.  Romberg is negative, but is slightly unsteady. No drift is seen.  Reflexes: Deep tendon reflexes are symmetric.   MRI brain 03/16/20:  IMPRESSION: This is a normal age-appropriate MRI of the brain with and without contrast.  Incidental note is made of minimal age-appropriate generalized cortical atrophy and a couple punctate T2/FLAIR hyperintense foci in the hemispheres consistent with negligible chronic microvascular ischemic change, typical for age.  No acute findings.  Normal enhancement pattern.  * MRI scan images were reviewed online. I agree with the written report.    Assessment/Plan:  1.  Intractable migraine headache  The patient never took the [place] secondary to side effects.  We will stop the Ajovy and start Nurtec 75 mg every other day.  She will use the Aleve as a rescue drug.  Zofran was given if needed for nausea.  She will follow-up here in 4 months, in the future she can be followed through Dr. Billey Gosling.  Jill Alexanders MD 09/26/2020 3:28 PM  [facility] [address] [place], [place] [address]  Phone [phone] Fax [phone]

## 2020-10-03 ENCOUNTER — Telehealth: Payer: Self-pay | Admitting: *Deleted

## 2020-10-03 NOTE — Telephone Encounter (Signed)
I am not sure I understand the denial, it is this related to the orally disintegrating form of the medication, I did not order and orally disintegrating tablet.  Nurtec does have an indication for migraine preventative therapy taking 75 mg every other day.  This patient reports 4 to 5 days with headache a week, indicating that she has up to 20 days with headache a month.  It is not clear to me that the proper information was transmitted to the insurance, may need to contact someone and make sure that have the proper information.

## 2020-10-03 NOTE — Telephone Encounter (Signed)
Nurtec PA, key: [id number], [id number]. Failed:   Effexor, Topamax, Inderal, nortriptyline, Imitrex, Maxalt, Botox, Aimovig, Depakote, magnesium, riboflavin, gabapentin, Ajovy. .Your information has been sent to [facility].

## 2020-10-03 NOTE — Telephone Encounter (Signed)
Sent in appeal on CMM, key: [id number]

## 2020-10-03 NOTE — Telephone Encounter (Signed)
Called [facility] pharmacy help desk, spoke with MJ to initiate new PA for nurtec 75 mg TBDP. This drug is not on her formulary, only nurtec ODT is on her formulary. She advised I wait to hear from them on appeal submitted this morning.

## 2020-10-03 NOTE — Telephone Encounter (Signed)
Based on the information your doctor gave [place] about your health condition, we are not able to approve Nurtec Tab '75mg'$  Odt. NURTEC TAB '75MG'$  ODT is denied for not meeting the prior authorization requirement(s). Medication authorization requires the following: (1) The drug will be used for the acute treatment of your migraine condition. (2) You have less than 15 headache days per month. Will send to MD for recommendation.

## 2020-10-04 NOTE — Telephone Encounter (Signed)
Called [facility] pharmacy help desk, spoke with Gerald Stabs and advised that Nurtec is FDA approved for migraine prevention. We never received denial letter. He will fax denial letter; it should be received today. I asked to speak with someone about denial. He spoke with peer to peer dept and then stated I will have to follow appeal process as outlined in denial letter, cannot speak to anyone about denial. He stated this is because she is a Medicare patient; that is their guidelines.

## 2020-10-08 ENCOUNTER — Encounter: Payer: Self-pay | Admitting: *Deleted

## 2020-10-08 MED ORDER — NURTEC 75 MG PO TBDP: 75.0000 mg | ORAL_TABLET | ORAL | 3 refills | Status: DC

## 2020-10-08 NOTE — Telephone Encounter (Signed)
[facility], not open until 9 am, will call back. Called patient, LVM informing her of medication denial by her insurance, that I will call pharmacy to make sure they are sending in correct Rx. Left # for questions.

## 2020-10-08 NOTE — Addendum Note (Signed)
Addended by: Kathrynn Ducking on: 10/08/2020 09:31 AM   Modules accepted: Orders

## 2020-10-08 NOTE — Telephone Encounter (Signed)
Called [facility], spoke with Gildardo Cranker, pharmacist and advised her of denial. She stated Nurtec ODT is same as  TBDP (Tablet Dispersible). She ran Rx through as 15 tabs for 30 days instead of 16 tabs and it went through. She will fill Rx with 15 tabs for 30 days for patient. It is now approved. Will send patient my chart to advise her.

## 2020-10-08 NOTE — Telephone Encounter (Signed)
I have changed the prescription in the computer.  The reason for ordering 16 is that the medication comes in packets of 8, in the past I have ordered 10 tablets for patient, and they were only able to get 8 as they are packaged that way.

## 2020-10-09 DIAGNOSIS — H26491 Other secondary cataract, right eye: Secondary | ICD-10-CM | POA: Diagnosis not present

## 2020-10-10 ENCOUNTER — Encounter: Payer: Self-pay | Admitting: Family Medicine

## 2020-10-11 MED ORDER — TRIAMCINOLONE ACETONIDE 0.1 % EX CREA
1.0000 | TOPICAL_CREAM | Freq: Two times a day (BID) | CUTANEOUS | 0 refills | Status: DC
Start: 2020-10-11 — End: 2021-04-24

## 2020-10-18 ENCOUNTER — Other Ambulatory Visit: Payer: Self-pay | Admitting: Family Medicine

## 2020-10-18 DIAGNOSIS — E876 Hypokalemia: Secondary | ICD-10-CM | POA: Diagnosis not present

## 2020-10-18 DIAGNOSIS — G43709 Chronic migraine without aura, not intractable, without status migrainosus: Secondary | ICD-10-CM | POA: Diagnosis not present

## 2020-10-18 DIAGNOSIS — N1832 Chronic kidney disease, stage 3b: Secondary | ICD-10-CM | POA: Diagnosis not present

## 2020-10-22 ENCOUNTER — Other Ambulatory Visit: Payer: Self-pay | Admitting: Nurse Practitioner

## 2020-10-22 DIAGNOSIS — E559 Vitamin D deficiency, unspecified: Secondary | ICD-10-CM

## 2020-10-24 ENCOUNTER — Other Ambulatory Visit: Payer: Self-pay | Admitting: Nephrology

## 2020-10-24 DIAGNOSIS — N1832 Chronic kidney disease, stage 3b: Secondary | ICD-10-CM

## 2020-10-30 DIAGNOSIS — N3020 Other chronic cystitis without hematuria: Secondary | ICD-10-CM | POA: Diagnosis not present

## 2020-11-02 ENCOUNTER — Ambulatory Visit
Admission: RE | Admit: 2020-11-02 | Discharge: 2020-11-02 | Disposition: A | Discharge status: Home / Self Care | Payer: Medicare Other | Source: Ambulatory Visit | Attending: Nephrology | Admitting: Nephrology

## 2020-11-02 DIAGNOSIS — N1832 Chronic kidney disease, stage 3b: Secondary | ICD-10-CM

## 2020-11-02 DIAGNOSIS — N189 Chronic kidney disease, unspecified: Secondary | ICD-10-CM | POA: Diagnosis not present

## 2020-11-06 ENCOUNTER — Other Ambulatory Visit: Payer: Self-pay | Admitting: Nurse Practitioner

## 2020-11-06 ENCOUNTER — Telehealth: Payer: Self-pay | Admitting: Nurse Practitioner

## 2020-11-06 DIAGNOSIS — E559 Vitamin D deficiency, unspecified: Secondary | ICD-10-CM

## 2020-11-06 DIAGNOSIS — N1832 Chronic kidney disease, stage 3b: Secondary | ICD-10-CM | POA: Diagnosis not present

## 2020-11-06 NOTE — Telephone Encounter (Signed)
Rescheduled per provider template change, pt has been called and confirmed change

## 2020-11-13 DIAGNOSIS — H18413 Arcus senilis, bilateral: Secondary | ICD-10-CM | POA: Diagnosis not present

## 2020-11-13 DIAGNOSIS — Z961 Presence of intraocular lens: Secondary | ICD-10-CM | POA: Diagnosis not present

## 2020-11-13 DIAGNOSIS — H26491 Other secondary cataract, right eye: Secondary | ICD-10-CM | POA: Diagnosis not present

## 2020-11-13 DIAGNOSIS — H26493 Other secondary cataract, bilateral: Secondary | ICD-10-CM | POA: Diagnosis not present

## 2020-11-13 DIAGNOSIS — H16223 Keratoconjunctivitis sicca, not specified as Sjogren's, bilateral: Secondary | ICD-10-CM | POA: Diagnosis not present

## 2020-11-15 DIAGNOSIS — H26491 Other secondary cataract, right eye: Secondary | ICD-10-CM | POA: Diagnosis not present

## 2020-11-19 ENCOUNTER — Other Ambulatory Visit: Payer: Self-pay

## 2020-11-19 ENCOUNTER — Other Ambulatory Visit: Payer: Self-pay | Admitting: *Deleted

## 2020-11-19 DIAGNOSIS — R072 Precordial pain: Secondary | ICD-10-CM | POA: Insufficient documentation

## 2020-11-19 MED ORDER — TOPIRAMATE 100 MG PO TABS: 100.0000 mg | ORAL_TABLET | Freq: Two times a day (BID) | ORAL | 1 refills | Status: DC

## 2020-11-19 MED ORDER — PANTOPRAZOLE SODIUM 40 MG PO TBEC: DELAYED_RELEASE_TABLET | ORAL | 0 refills | Status: DC

## 2020-11-19 MED ORDER — SIMVASTATIN 40 MG PO TABS: 40.0000 mg | ORAL_TABLET | Freq: Every day | ORAL | 0 refills | Status: DC

## 2020-11-19 NOTE — Progress Notes (Signed)
Cardiology Office Note   Date:  11/20/2020   ID:  Willadeen, Col[place]uono April 28, 1955, MRN [id number]  PCP:  Abner Greenspan, MD  Cardiologist:   None   Chief Complaint  Patient presents with   Palpitations       History of Present Illness: [name] [name] is a 65 y.o. female who presents for follow up of palpitations..  She also has a history of coronary disease.  In 2017 she had a Lexiscan Myoview prior to shoulder surgery.   She had another stress test in 2020 that was low risk for any ischemia.   The patient returns for follow-up.  She states she has been having increasing palpitations.  This is happening every day.  Her watch says there is been some atrial fibrillation but she does not think she has any tracings that she can show me.  She says the palpitations come on and off and she does take 25 mg of metoprolol from an old prescription and this seems to help after about 30 minutes.  She says her heart rate will go from 65-120.  She thinks it is unprovoked.  She gets a little lightheaded.  She might get some occasional dizziness.  She has some chronic discomfort under her left breast and mid sternum that is really unchanged from 2020 when she had her last stress test.  She is not having any new shortness of breath, PND or orthopnea.  She has not had any presyncope or syncope.She does report that when she gets this discomfort her heart rate will typically go up.  It might be about 100.  It is typically in the 60s.  She gets anxious with this.  She does have as needed beta-blocker to take which she takes about 4-5 times per month.  Past Medical History:  Diagnosis Date   Anxiety    Breast cancer ([place])    ER+/PR+/Her2-   Concussion 05/26/14   Depression    Frequent UTI    GERD (gastroesophageal reflux disease)    Hypothyroid    Microscopic hematuria    Migraine    Personal history of radiation therapy    Radiation 03/22/14-04/19/14   Breast Cancer upper-outer    Restless leg syndrome    Seasonal allergies    Stented coronary artery    LAD 95% 2007.   Vitamin D deficiency     Past Surgical History:  Procedure Laterality Date   ABDOMINAL HYSTERECTOMY     APPENDECTOMY     back surgery  1997   herniated disc repair   BREAST LUMPECTOMY Right    2016   CARDIAC CATHETERIZATION  2007   stent   CHOLECYSTECTOMY OPEN     CYSTOSCOPY     neg   RADIOACTIVE SEED GUIDED PARTIAL MASTECTOMY WITH AXILLARY SENTINEL LYMPH NODE BIOPSY Right 02/14/2014   Procedure: RADIOACTIVE SEED GUIDED PARTIAL MASTECTOMY WITH AXILLARY SENTINEL LYMPH NODE BIOPSY;  Surgeon: Erroll Luna, MD;  Location: [place];  Service: General;  Laterality: Right;   SHOULDER SURGERY Right      Current Outpatient Medications  Medication Sig Dispense Refill   aspirin 81 MG tablet Take 81 mg by mouth daily.     CINNAMON PO Take 1,000 mg by mouth daily.      Coenzyme Q10 (CO Q-10) 200 MG CAPS Take 1 capsule by mouth daily.       famotidine (PEPCID) 20 MG tablet Take 2 tablets by mouth twice daily 360 tablet 2  fish oil-omega-3 fatty acids 1000 MG capsule Take 1.2 g by mouth 2 (two) times daily.     furosemide (LASIX) 20 MG tablet Take 1 tablet by mouth once daily 90 tablet 2   Ginkgo Biloba 120 MG CAPS Take 1 capsule by mouth daily.       KEFLEX 250 MG capsule      levothyroxine (SYNTHROID) 100 MCG tablet Take 100 mcg by mouth daily before breakfast.     LIOTHYRONINE SODIUM PO 5 mg.      Magnesium 250 MG TABS Take 250 mg by mouth daily.     metoprolol tartrate (LOPRESSOR) 25 MG tablet Take 1 tablet (25 mg total) by mouth daily as needed (palpitations). 30 tablet 1   Multiple Vitamin (MULTIVITAMIN) capsule Take 1 capsule by mouth daily.     nitroGLYCERIN (NITROSTAT) 0.4 MG SL tablet Place 1 tablet (0.4 mg total) under the tongue every 5 (five) minutes as needed. Pain 25 tablet 11   nystatin ointment (MYCOSTATIN) Apply 1 application topically 2 (two) times daily. To  affected areas 15 g 1   ondansetron (ZOFRAN) 4 MG tablet Take 1 tablet (4 mg total) by mouth every 8 (eight) hours as needed for nausea or vomiting. 20 tablet 3   pantoprazole (PROTONIX) 40 MG tablet TAKE 1 TABLET BY MOUTH TWICE DAILY 180 tablet 0   potassium chloride SA (KLOR-CON) 20 MEQ tablet TAKE 2 TABLETS BY MOUTH TWICE DAILY AND ONE HALF EVERY DAY AT BEDTIME 405 tablet 2   propranolol ER (INDERAL LA) 60 MG 24 hr capsule Take 1 capsule by mouth once daily at bedtime 90 capsule 3   Riboflavin 400 MG TABS Take 400 mg by mouth daily.     Rimegepant Sulfate (NURTEC) 75 MG TBDP Take 75 mg by mouth every other day. 15 tablet 3   simvastatin (ZOCOR) 40 MG tablet Take 1 tablet (40 mg total) by mouth at bedtime. 90 tablet 0   topiramate (TOPAMAX) 100 MG tablet Take 1 tablet (100 mg total) by mouth 2 (two) times daily. 180 tablet 1   triamcinolone cream (KENALOG) 0.1 % Apply 1 application topically 2 (two) times daily. 15 g 0   No current facility-administered medications for this visit.    Allergies:   Iohexol   ROS:  Please see the history of present illness.   Otherwise, review of systems are positive for none.   All other systems are reviewed and negative.    PHYSICAL EXAM: VS:  BP 110/60   Pulse 61   Ht _0  (1.727 m)   Wt 204 lb 3.2 oz (92.6 kg)   SpO2 97%   BMI 31.05 kg/m  , BMI Body mass index is 31.05 kg/m. GENERAL:  Well appearing NECK:  No jugular venous distention, waveform within normal limits, carotid upstroke brisk and symmetric, no bruits, no thyromegaly LUNGS:  Clear to auscultation bilaterally CHEST:  Unremarkable HEART:  PMI not displaced or sustained,S1 and S2 within normal limits, no S3, no S4, no clicks, no rubs, no murmurs ABD:  Flat, positive bowel sounds normal in frequency in pitch, no bruits, no rebound, no guarding, no midline pulsatile mass, no hepatomegaly, no splenomegaly EXT:  2 plus pulses throughout, no edema, no cyanosis no clubbing  EKG:  EKG is   ordered today. The ekg ordered today demonstrates sinus rhythm, rate 61, left axis deviation, poor anterior R wave progression, no acute ST-T wave changes.   Recent Labs: 07/18/2020: ALT 39; BUN 23; Creatinine, Ser 1.41;  Hemoglobin 13.4; Platelets 207.0; Potassium 4.6; Sodium 140; TSH 0.74    Lipid Panel    Component Value Date/Time   CHOL 125 07/18/2020 1547   CHOL 140 04/06/2020 1440   TRIG 274.0 (H) 07/18/2020 1547   HDL 41.20 07/18/2020 1547   HDL 46 04/06/2020 1440   CHOLHDL 3 07/18/2020 1547   VLDL 54.8 (H) 07/18/2020 1547   LDLCALC 68 04/06/2020 1440   LDLCALC 74 06/26/2017 1531   LDLDIRECT 62.0 07/18/2020 1547      Wt Readings from Last 3 Encounters:  11/20/20 204 lb 3.2 oz (92.6 kg)  09/26/20 203 lb (92.1 kg)  08/15/20 202 lb 6 oz (91.8 kg)      Other studies Reviewed: Additional studies/ records that were reviewed today include: Labs. Review of the above records demonstrates:  Please see elsewhere in the note.     ASSESSMENT AND PLAN:    CHEST PAIN/CAD - This is an unchanged pattern from the time of her negative stress test in 2020.  No change in therapy or further testing.  She needs continued secondary risk reduction.  Of note she did have normal thyroid and electrolytes recently.   DYSLIPIDEMIA -  LDL was 68.  No change in therapy.   PALPITATIONS: I will apply a 3-day monitor and her son will try to help her download any information from her wearable.  HTN: The blood pressure is well controlled.  No change in therapy.  CKD IIIA: She is now being followed by nephrology.  Her last creatinine was 1.41.   Current medicines are reviewed at length with the patient today.  The patient does not have concerns regarding medicines.  The following changes have been made:  no change  Labs/ tests ordered today include:   Orders Placed This Encounter  Procedures   EKG 12-Lead      Disposition:   FU with me in one year.     Signed, Minus Breeding, MD   11/20/2020 5:37 PM    [place]

## 2020-11-20 ENCOUNTER — Other Ambulatory Visit: Payer: Self-pay

## 2020-11-20 ENCOUNTER — Ambulatory Visit (INDEPENDENT_AMBULATORY_CARE_PROVIDER_SITE_OTHER): Payer: Medicare Other | Admitting: Cardiology

## 2020-11-20 ENCOUNTER — Other Ambulatory Visit: Payer: Self-pay | Admitting: Cardiology

## 2020-11-20 ENCOUNTER — Ambulatory Visit (INDEPENDENT_AMBULATORY_CARE_PROVIDER_SITE_OTHER): Payer: Medicare Other

## 2020-11-20 ENCOUNTER — Encounter: Payer: Self-pay | Admitting: Cardiology

## 2020-11-20 VITALS — BP 110/60 | HR 61 | Ht 68.0 in | Wt 204.2 lb

## 2020-11-20 DIAGNOSIS — R42 Dizziness and giddiness: Secondary | ICD-10-CM

## 2020-11-20 DIAGNOSIS — E785 Hyperlipidemia, unspecified: Secondary | ICD-10-CM

## 2020-11-20 DIAGNOSIS — R002 Palpitations: Secondary | ICD-10-CM

## 2020-11-20 DIAGNOSIS — R072 Precordial pain: Secondary | ICD-10-CM

## 2020-11-20 MED ORDER — METOPROLOL TARTRATE 25 MG PO TABS: 25.0000 mg | ORAL_TABLET | Freq: Every day | ORAL | 1 refills | Status: DC | PRN

## 2020-11-20 NOTE — Patient Instructions (Signed)
Medication Instructions:  Your physician recommends that you continue on your current medications as directed. Please refer to the Current Medication list given to you today.  *If you need a refill on your cardiac medications before your next appointment, please call your pharmacy*  Testing/Procedures: [facility] Monitor Instructions   Your physician has requested you wear a ZIO patch monitor for _3__ days.  This is a single patch monitor.   [employer] supplies one patch monitor per enrollment. Additional stickers are not available. Please do not apply patch if you will be having a Nuclear Stress Test, Echocardiogram, Cardiac CT, MRI, or Chest Xray during the period you would be wearing the monitor. The patch cannot be worn during these tests. You cannot remove and re-apply the ZIO XT patch monitor.  Your ZIO patch monitor will be sent Fed Ex from [employer] directly to your home address. It may take 3-5 days to receive your monitor after you have been enrolled.  Once you have received your monitor, please review the enclosed instructions. Your monitor has already been registered assigning a specific monitor serial # to you.  Billing and Patient Assistance Program Information   We have supplied [employer] with any of your insurance information on file for billing purposes. [employer] offers a sliding scale Patient Assistance Program for patients that do not have insurance, or whose insurance does not completely cover the cost of the ZIO monitor.   You must apply for the Patient Assistance Program to qualify for this discounted rate.     To apply, please call [employer] at [phone], select option 4, then select option 2, and ask to apply for Patient Assistance Program.  Theodore Demark will ask your household income, and how many people are in your household.  They will quote your out-of-pocket cost based on that information.  [employer] will also be able to set up a 27-month, interest-free payment plan  if needed.  Applying the monitor   Shave hair from upper left chest.  Hold abrader disc by orange tab. Rub abrader in 40 strokes over the upper left chest as indicated in your monitor instructions.  Clean area with 4 enclosed alcohol pads. Let dry.  Apply patch as indicated in monitor instructions. Patch will be placed under collarbone on left side of chest with arrow pointing upward.  Rub patch adhesive wings for 2 minutes. Remove white label marked "1". Remove the white label marked "2". Rub patch adhesive wings for 2 additional minutes.  While looking in a mirror, press and release button in center of patch. A small green light will flash 3-4 times. This will be your only indicator that the monitor has been turned on. ?  Do not shower for the first 24 hours. You may shower after the first 24 hours.  Press the button if you feel a symptom. You will hear a small click. Record Date, Time and Symptom in the Patient Logbook.  When you are ready to remove the patch, follow instructions on the last 2 pages of the Patient Logbook. Stick patch monitor onto the last page of Patient Logbook.  Place Patient Logbook in the blue and white box.  Use locking tab on box and tape box closed securely.  The blue and white box has prepaid postage on it. Please place it in the mailbox as soon as possible. Your physician should have your test results approximately 7 days after the monitor has been mailed back to [facility].  Call [facility]  at [phone] if you have questions regarding your ZIO XT patch monitor. Call them immediately if you see an orange light blinking on your monitor.  If your monitor falls off in less than 4 days, contact our Monitor department at [phone]. ?If your monitor becomes loose or falls off after 4 days call [employer] at [phone] for suggestions on securing your monitor.?  Follow-Up: At [facility], you and your health needs are our priority.  As part of  our continuing mission to provide you with exceptional heart care, we have created designated Provider Care Teams.  These Care Teams include your primary Cardiologist (physician) and Advanced Practice Providers (APPs -  Physician Assistants and Nurse Practitioners) who all work together to provide you with the care you need, when you need it.  We recommend signing up for the patient portal called "MyChart".  Sign up information is provided on this After Visit Summary.  MyChart is used to connect with patients for Virtual Visits (Telemedicine).  Patients are able to view lab/test results, encounter notes, upcoming appointments, etc.  Non-urgent messages can be sent to your provider as well.   To learn more about what you can do with MyChart, go to [email or web address].    Your next appointment:   3 month(s)  The format for your next appointment:   In Person  Provider:   You will see one of the following Advanced Practice Providers on your designated Care Team:   Rosaria Ferries, PA-C Caron Presume, PA-C Jory Sims, DNP, ANP

## 2020-11-20 NOTE — Progress Notes (Unsigned)
Patient enrolled for [employer] to mail a 3 day ZIO XT monitor to her address on file.

## 2020-11-24 DIAGNOSIS — R42 Dizziness and giddiness: Secondary | ICD-10-CM

## 2020-11-24 DIAGNOSIS — R002 Palpitations: Secondary | ICD-10-CM | POA: Diagnosis not present

## 2020-11-27 DIAGNOSIS — H26492 Other secondary cataract, left eye: Secondary | ICD-10-CM | POA: Diagnosis not present

## 2020-12-06 DIAGNOSIS — R42 Dizziness and giddiness: Secondary | ICD-10-CM | POA: Diagnosis not present

## 2020-12-06 DIAGNOSIS — R002 Palpitations: Secondary | ICD-10-CM | POA: Diagnosis not present

## 2020-12-07 ENCOUNTER — Other Ambulatory Visit: Payer: Self-pay | Admitting: *Deleted

## 2020-12-07 MED ORDER — FUROSEMIDE 20 MG PO TABS: 20.0000 mg | ORAL_TABLET | Freq: Every day | ORAL | 1 refills | Status: DC

## 2020-12-11 ENCOUNTER — Encounter: Payer: Self-pay | Admitting: *Deleted

## 2020-12-11 ENCOUNTER — Telehealth: Payer: Self-pay | Admitting: Cardiology

## 2020-12-11 NOTE — Telephone Encounter (Signed)
Left message for pt to call.

## 2020-12-11 NOTE — Telephone Encounter (Signed)
-----   Message from Minus Breeding, MD sent at 12/09/2020  2:54 PM EST ----- She does have some runs of SVT.  I would suggest that she try to take 12.5 mg po BID.  Call [name] with the results and send results to Tower, Wynelle Fanny, MD

## 2020-12-11 NOTE — Telephone Encounter (Signed)
Pt returning a phone call please advise

## 2020-12-11 NOTE — Telephone Encounter (Signed)
Called patient, advised of results from monitor.  Patient states that she has already been taking the Metoprolol daily (she did not see the as needed part of her RX) she does state that she feels okay, but still has been monitoring the issues. I did advise it could take a few weeks to notice improvement, but we could have her monitor the symptoms and let us know. I would route a message to MD to make aware of this and see what he would recommend as well.   Thanks!

## 2020-12-12 ENCOUNTER — Other Ambulatory Visit: Payer: Self-pay | Admitting: *Deleted

## 2020-12-12 DIAGNOSIS — G43719 Chronic migraine without aura, intractable, without status migrainosus: Secondary | ICD-10-CM

## 2020-12-12 DIAGNOSIS — G43009 Migraine without aura, not intractable, without status migrainosus: Secondary | ICD-10-CM

## 2020-12-12 MED ORDER — NURTEC 75 MG PO TBDP
75.0000 mg | ORAL_TABLET | ORAL | 0 refills | Status: DC
Start: 2020-12-12 — End: 2021-03-12

## 2020-12-12 NOTE — Telephone Encounter (Signed)
This encounter was created in error - please disregard.

## 2020-12-12 NOTE — Telephone Encounter (Signed)
Called patient, LVM advising of information from MD below that suggested no changes.  Advised patient to call back if symptoms do not improve, left call back number if questions/concerns.

## 2020-12-13 ENCOUNTER — Other Ambulatory Visit: Payer: Self-pay | Admitting: *Deleted

## 2020-12-13 MED ORDER — PROPRANOLOL HCL ER 60 MG PO CP24: ORAL_CAPSULE | ORAL | 3 refills | Status: DC

## 2020-12-22 ENCOUNTER — Other Ambulatory Visit: Payer: Self-pay | Admitting: Cardiology

## 2020-12-26 ENCOUNTER — Other Ambulatory Visit: Payer: Self-pay

## 2020-12-26 ENCOUNTER — Ambulatory Visit
Admission: RE | Admit: 2020-12-26 | Discharge: 2020-12-26 | Disposition: A | Discharge status: Home / Self Care | Payer: Medicare Other | Source: Ambulatory Visit | Attending: Hematology | Admitting: Hematology

## 2020-12-26 DIAGNOSIS — Z1231 Encounter for screening mammogram for malignant neoplasm of breast: Secondary | ICD-10-CM | POA: Diagnosis not present

## 2020-12-27 ENCOUNTER — Other Ambulatory Visit: Payer: Self-pay

## 2020-12-27 MED ORDER — METOPROLOL TARTRATE 25 MG PO TABS: 25.0000 mg | ORAL_TABLET | Freq: Every day | ORAL | 1 refills | Status: DC | PRN

## 2020-12-31 ENCOUNTER — Other Ambulatory Visit: Payer: Self-pay | Admitting: *Deleted

## 2020-12-31 MED ORDER — FAMOTIDINE 20 MG PO TABS: 40.0000 mg | ORAL_TABLET | Freq: Two times a day (BID) | ORAL | 1 refills | Status: DC

## 2020-12-31 MED ORDER — POTASSIUM CHLORIDE CRYS ER 20 MEQ PO TBCR: EXTENDED_RELEASE_TABLET | ORAL | 1 refills | Status: DC

## 2021-01-17 ENCOUNTER — Telehealth: Payer: Self-pay

## 2021-01-17 NOTE — Telephone Encounter (Signed)
Request Reference Number: [id number]. EMGALITY INJ 120MG /ML is approved through 01/26/2022.

## 2021-01-17 NOTE — Telephone Encounter (Signed)
I submitted a PA for Emgality on CMM, Key: [id number], awaiting determination.

## 2021-01-23 ENCOUNTER — Other Ambulatory Visit: Payer: Self-pay

## 2021-01-23 MED ORDER — METOPROLOL TARTRATE 25 MG PO TABS: 25.0000 mg | ORAL_TABLET | Freq: Every day | ORAL | 0 refills | Status: DC | PRN

## 2021-01-31 DIAGNOSIS — N1832 Chronic kidney disease, stage 3b: Secondary | ICD-10-CM | POA: Diagnosis not present

## 2021-02-01 ENCOUNTER — Other Ambulatory Visit: Payer: Self-pay | Admitting: Cardiology

## 2021-02-06 DIAGNOSIS — N1831 Chronic kidney disease, stage 3a: Secondary | ICD-10-CM | POA: Diagnosis not present

## 2021-02-06 DIAGNOSIS — E876 Hypokalemia: Secondary | ICD-10-CM | POA: Diagnosis not present

## 2021-02-06 DIAGNOSIS — G43709 Chronic migraine without aura, not intractable, without status migrainosus: Secondary | ICD-10-CM | POA: Diagnosis not present

## 2021-02-07 ENCOUNTER — Other Ambulatory Visit: Payer: Self-pay | Admitting: Cardiology

## 2021-02-08 ENCOUNTER — Other Ambulatory Visit: Payer: Self-pay | Admitting: Cardiology

## 2021-02-12 ENCOUNTER — Ambulatory Visit: Payer: Medicare Other | Admitting: Neurology

## 2021-02-12 ENCOUNTER — Telehealth: Payer: Self-pay | Admitting: Neurology

## 2021-02-12 NOTE — Progress Notes (Deleted)
PATIENT: [name] [name] DOB: March 26, 1955  REASON FOR VISIT: Follow up HISTORY FROM: Patient PRIMARY NEUROLOGIST:   HISTORY OF PRESENT ILLNESS: Today 02/12/21 [name] [name] here today for follow-up for migraine headache.  HISTORY 09/26/2020 Dr. Jannifer Franklin: [name] [name] is a 66 year old right-handed white female with a history of intractable migraine headache.  The patient has been on multiple medications in the past for her migraine.  She recently was placed on Ajovy, but only took an injection or 2 and stopped the medication because she said it made her slightly dizzy for 2 to 3 days after the injection.  She was on Ubrelvy as a rescue drug which did help some, but she claims that Aleve does just as well.  She does have 4 or 5 days with headache a week, she may have 1 or 2 severe headaches a month that may be more incapacitating associated with nausea vomiting, photophobia and phonophobia.  Her headaches usually last all day long.  She returns to the office today for further evaluation.  Her headaches are usually worse in the cooler months of the year.   REVIEW OF SYSTEMS: Out of a complete 14 system review of symptoms, the patient complains only of the following symptoms, and all other reviewed systems are negative.  ALLERGIES: Allergies  Allergen Reactions   Iohexol      Desc: throat selling resp distress hives.premedicate pt.entered 07/06/04, bsw.     HOME MEDICATIONS: Outpatient Medications Prior to Visit  Medication Sig Dispense Refill   metoprolol tartrate (LOPRESSOR) 25 MG tablet TAKE 1 TABLET BY MOUTH DAILY AS NEEDED (PALPITATIONS) 90 tablet 0   aspirin 81 MG tablet Take 81 mg by mouth daily.     CINNAMON PO Take 1,000 mg by mouth daily.      Coenzyme Q10 (CO Q-10) 200 MG CAPS Take 1 capsule by mouth daily.       famotidine (PEPCID) 20 MG tablet Take 2 tablets (40 mg total) by mouth 2 (two) times daily. 360 tablet 1   fish oil-omega-3 fatty acids 1000 MG capsule Take 1.2 g  by mouth 2 (two) times daily.     furosemide (LASIX) 20 MG tablet Take 1 tablet (20 mg total) by mouth daily. 90 tablet 1   Ginkgo Biloba 120 MG CAPS Take 1 capsule by mouth daily.       KEFLEX 250 MG capsule      levothyroxine (SYNTHROID) 100 MCG tablet Take 100 mcg by mouth daily before breakfast.     LIOTHYRONINE SODIUM PO 5 mg.      Magnesium 250 MG TABS Take 250 mg by mouth daily.     Multiple Vitamin (MULTIVITAMIN) capsule Take 1 capsule by mouth daily.     nitroGLYCERIN (NITROSTAT) 0.4 MG SL tablet DISSOLVE ONE TABLET UNDER THE TONGUE EVERY 5 MINUTES AS NEEDED FOR CHEST PAIN.  DO NOT EXCEED A TOTAL OF 3 DOSES IN 15 MINUTES 25 tablet 6   nystatin ointment (MYCOSTATIN) Apply 1 application topically 2 (two) times daily. To affected areas 15 g 1   ondansetron (ZOFRAN) 4 MG tablet Take 1 tablet (4 mg total) by mouth every 8 (eight) hours as needed for nausea or vomiting. 20 tablet 3   pantoprazole (PROTONIX) 40 MG tablet TAKE 1 TABLET BY MOUTH  TWICE DAILY 180 tablet 3   potassium chloride SA (KLOR-CON M) 20 MEQ tablet TAKE 2 TABLETS BY MOUTH TWICE DAILY AND ONE HALF EVERY DAY AT BEDTIME 405 tablet 1   propranolol  ER (INDERAL LA) 60 MG 24 hr capsule Take 1 capsule by mouth once daily at bedtime 90 capsule 3   Riboflavin 400 MG TABS Take 400 mg by mouth daily.     Rimegepant Sulfate (NURTEC) 75 MG TBDP Take 75 mg by mouth every other day. 45 tablet 0   simvastatin (ZOCOR) 40 MG tablet TAKE 1 TABLET BY MOUTH AT  BEDTIME 90 tablet 3   topiramate (TOPAMAX) 100 MG tablet Take 1 tablet (100 mg total) by mouth 2 (two) times daily. 180 tablet 1   triamcinolone cream (KENALOG) 0.1 % Apply 1 application topically 2 (two) times daily. 15 g 0   No facility-administered medications prior to visit.    PAST MEDICAL HISTORY: Past Medical History:  Diagnosis Date   Anxiety    Breast cancer ([place])    ER+/PR+/Her2-   Concussion 05/26/14   Depression    Frequent UTI    GERD (gastroesophageal reflux  disease)    Hypothyroid    Microscopic hematuria    Migraine    Personal history of radiation therapy    Radiation 03/22/14-04/19/14   Breast Cancer upper-outer   Restless leg syndrome    Seasonal allergies    Stented coronary artery    LAD 95% 2007.   Vitamin D deficiency     PAST SURGICAL HISTORY: Past Surgical History:  Procedure Laterality Date   ABDOMINAL HYSTERECTOMY     APPENDECTOMY     back surgery  1997   herniated disc repair   BREAST LUMPECTOMY Right    2016   CARDIAC CATHETERIZATION  2007   stent   CHOLECYSTECTOMY OPEN     CYSTOSCOPY     neg   RADIOACTIVE SEED GUIDED PARTIAL MASTECTOMY WITH AXILLARY SENTINEL LYMPH NODE BIOPSY Right 02/14/2014   Procedure: RADIOACTIVE SEED GUIDED PARTIAL MASTECTOMY WITH AXILLARY SENTINEL LYMPH NODE BIOPSY;  Surgeon: Erroll Luna, MD;  Location: [facility];  Service: General;  Laterality: Right;   SHOULDER SURGERY Right     FAMILY HISTORY: Family History  Problem Relation Age of Onset   Coronary artery disease Mother    Thyroid disease Mother    Alzheimer's disease Mother    Diabetes Mother    Heart disease Mother    Melanoma Brother    Ovarian cancer Paternal Aunt 59   Bone cancer Paternal Uncle    Alzheimer's disease Maternal Grandmother    Heart disease Maternal Grandfather    Pancreatic cancer Maternal Grandfather 11   Ovarian cancer Maternal Aunt        dx in 60s   Throat cancer Maternal Uncle        smoker   Lung cancer Maternal Uncle        heavy smoker   Ovarian cancer Paternal Aunt        dx in her 74s   Cancer Cousin        "female cancer"   Melanoma Father     SOCIAL HISTORY: Social History   Socioeconomic History   Marital status: Widowed    Spouse name: Not on file   Number of children: 1   Years of education: some coll.   Highest education level: Not on file  Occupational History   Occupation: not working  Tobacco Use   Smoking status: Never   Smokeless tobacco: Never   Vaping Use   Vaping Use: Never used  Substance and Sexual Activity   Alcohol use: No    Alcohol/week: 0.0 standard drinks  Comment: socially   Drug use: No   Sexual activity: Not Currently  Other Topics Concern   Not on file  Social History Narrative   Does not get any regular exercise      Doctors   -uro-[place]   -cardio--hochrein   -Neurology--Dr. Jannifer Franklin   -allergies--Dr. Carmelina Peal   Gyn past--Dr. Warnell Forester   Couselor--Dr. [place]   Endo-- Dr. Chalmers Cater      Lives with son - not working only child   Patient is right handed.   Patient drinks 1-2 cups of caffeine daily.      Retired Veterinary surgeon work   Investment banker, operational of Sales executive: Not on [employer] Insecurity: Not on file  Transportation Needs: Not on file  Physical Activity: Not on file  Stress: Not on file  Social Connections: Not on file  Intimate Partner Violence: Not on file      PHYSICAL EXAM  There were no vitals filed for this visit. There is no height or weight on file to calculate BMI.  Generalized: Well developed, in no acute distress   Neurological examination  Mentation: Alert oriented to time, place, history taking. Follows all commands speech and language fluent Cranial nerve II-XII: Pupils were equal round reactive to light. Extraocular movements were full, visual field were full on confrontational test. Facial sensation and strength were normal. Uvula tongue midline. Head turning and shoulder shrug  were normal and symmetric. Motor: The motor testing reveals 5 over 5 strength of all 4 extremities. Good symmetric motor tone is noted throughout.  Sensory: Sensory testing is intact to soft touch on all 4 extremities. No evidence of extinction is noted.  Coordination: Cerebellar testing reveals good finger-nose-finger and heel-to-shin bilaterally.  Gait and station: Gait is normal. Tandem gait is normal. Romberg is negative. No drift is seen.  Reflexes: Deep tendon reflexes are  symmetric and normal bilaterally.   DIAGNOSTIC DATA (LABS, IMAGING, TESTING) - I reviewed patient records, labs, notes, testing and imaging myself where available.  Lab Results  Component Value Date   WBC 6.0 07/18/2020   HGB 13.4 07/18/2020   HCT 38.6 07/18/2020   MCV 90.0 07/18/2020   PLT 207.0 07/18/2020      Component Value Date/Time   NA 140 07/18/2020 1547   NA 145 08/15/2016 1326   K 4.6 07/18/2020 1547   K 4.8 08/15/2016 1326   CL 109 07/18/2020 1547   CO2 23 07/18/2020 1547   CO2 22 08/15/2016 1326   GLUCOSE 112 (H) 07/18/2020 1547   GLUCOSE 129 08/15/2016 1326   BUN 23 07/18/2020 1547   BUN 15.4 08/15/2016 1326   CREATININE 1.41 (H) 07/18/2020 1547   CREATININE 1.20 (H) 06/26/2017 1531   CREATININE 1.5 (H) 08/15/2016 1326   CALCIUM 9.9 07/18/2020 1547   CALCIUM 10.4 08/15/2016 1326   PROT 6.9 07/18/2020 1547   PROT 7.0 10/29/2018 1604   PROT 7.5 08/15/2016 1326   ALBUMIN 4.5 07/18/2020 1547   ALBUMIN 4.7 10/29/2018 1604   ALBUMIN 4.2 08/15/2016 1326   AST 34 07/18/2020 1547   AST 28 08/15/2016 1326   ALT 39 (H) 07/18/2020 1547   ALT 27 08/15/2016 1326   ALKPHOS 53 07/18/2020 1547   ALKPHOS 58 08/15/2016 1326   BILITOT 1.6 (H) 07/18/2020 1547   BILITOT 1.4 (H) 10/29/2018 1604   BILITOT 1.62 (H) 08/15/2016 1326   GFRNONAA 41 (L) 04/06/2020 1431   GFRAA 42 (L) 09/12/2019 1228   Lab Results  Component Value Date   CHOL 125 07/18/2020   HDL 41.20 07/18/2020   LDLCALC 68 04/06/2020   LDLDIRECT 62.0 07/18/2020   TRIG 274.0 (H) 07/18/2020   CHOLHDL 3 07/18/2020   Lab Results  Component Value Date   HGBA1C 5.8 07/18/2020   Lab Results  Component Value Date   VITAMINB12 329 12/11/2016   Lab Results  Component Value Date   TSH 0.74 07/18/2020      ASSESSMENT AND PLAN 66 y.o. year old female  has a past medical history of Anxiety, Breast cancer ([place]), Concussion (05/26/14), Depression, Frequent UTI, GERD (gastroesophageal reflux disease),  Hypothyroid, Microscopic hematuria, Migraine, Personal history of radiation therapy, Radiation (03/22/14-04/19/14), Restless leg syndrome, Seasonal allergies, Stented coronary artery, and Vitamin D deficiency. here with ***   I spent 15 minutes with the patient. 50% of this time was spent   Butler Denmark, [place], DNP 02/12/2021, 5:38 AM [facility] [address], [place], [place] [address] [phone]

## 2021-02-12 NOTE — Telephone Encounter (Signed)
Pt cancelled appt due to feeling sick, maybe the flu, Covid test was negative.

## 2021-02-22 ENCOUNTER — Encounter: Payer: Self-pay | Admitting: Adult Health

## 2021-02-22 ENCOUNTER — Ambulatory Visit (INDEPENDENT_AMBULATORY_CARE_PROVIDER_SITE_OTHER): Payer: Medicare Other | Admitting: Nurse Practitioner

## 2021-02-22 ENCOUNTER — Other Ambulatory Visit: Payer: Self-pay

## 2021-02-22 VITALS — BP 116/52 | HR 58 | Ht 66.5 in | Wt 204.0 lb

## 2021-02-22 DIAGNOSIS — E785 Hyperlipidemia, unspecified: Secondary | ICD-10-CM | POA: Diagnosis not present

## 2021-02-22 DIAGNOSIS — N183 Chronic kidney disease, stage 3 unspecified: Secondary | ICD-10-CM | POA: Diagnosis not present

## 2021-02-22 DIAGNOSIS — I2510 Atherosclerotic heart disease of native coronary artery without angina pectoris: Secondary | ICD-10-CM | POA: Diagnosis not present

## 2021-02-22 DIAGNOSIS — I10 Essential (primary) hypertension: Secondary | ICD-10-CM

## 2021-02-22 DIAGNOSIS — R002 Palpitations: Secondary | ICD-10-CM

## 2021-02-22 NOTE — Progress Notes (Signed)
Office Visit    Patient Name: [name] [name] Date of Encounter: 02/22/2021  Primary Care Provider:  Abner Greenspan, MD Primary Cardiologist:  Minus Breeding, MD  Chief Complaint    66 year old female with a history of including CAD (s/p remote DES-LAD), aortic atherosclerosis on prior CT, recurrent chest pain, palpitations, hypertension, hyperlipidemia, CKD stage IIIa, hypothyroidism, GERD, depression and anxiety who presents for follow-up related to CAD and palpitations.  Past Medical History    Past Medical History:  Diagnosis Date   Anxiety    Breast cancer ([place])    ER+/PR+/Her2-   Concussion 05/26/14   Depression    Frequent UTI    GERD (gastroesophageal reflux disease)    Hypothyroid    Microscopic hematuria    Migraine    Personal history of radiation therapy    Radiation 03/22/14-04/19/14   Breast Cancer upper-outer   Restless leg syndrome    Seasonal allergies    Stented coronary artery    LAD 95% 2007.   Vitamin D deficiency    Past Surgical History:  Procedure Laterality Date   ABDOMINAL HYSTERECTOMY     APPENDECTOMY     back surgery  1997   herniated disc repair   BREAST LUMPECTOMY Right    2016   CARDIAC CATHETERIZATION  2007   stent   CHOLECYSTECTOMY OPEN     CYSTOSCOPY     neg   RADIOACTIVE SEED GUIDED PARTIAL MASTECTOMY WITH AXILLARY SENTINEL LYMPH NODE BIOPSY Right 02/14/2014   Procedure: RADIOACTIVE SEED GUIDED PARTIAL MASTECTOMY WITH AXILLARY SENTINEL LYMPH NODE BIOPSY;  Surgeon: Erroll Luna, MD;  Location: [facility];  Service: General;  Laterality: Right;   SHOULDER SURGERY Right     Allergies  Allergies  Allergen Reactions   Iohexol      Desc: throat selling resp distress hives.premedicate pt.entered 07/06/04, bsw.     History of Present Illness    66 year old female with above past medical history including CAD (s/p remote DES-LAD), aortic atherosclerosis on prior CT, recurrent chest pain, palpitations,  hypertension, hyperlipidemia, CKD stage IIIa, hypothyroidism, GERD, depression and anxiety.  She has had recurrent chest pain since the time of her stent.  Repeat stress tests, including most recent in February 2020 were negative for ischemia.  Last seen in the office in October 2022 and reported ongoing chest pain, unchanged from prior.  She did complain of palpitations. Outpatient monitor in November 2022 in the setting of palpitations showed normal sinus rhythm, runs of SVT lasting up to 1 minute and 49 seconds, no other significant arrhythmia.  She is managed on metoprolol.  She presents today for follow-up. Since her last visit she reports a great improvement in her palpitations. She denies dizziness, presyncope, syncope. She is taking propanolol ER 60 mg daily in addition to metoprolol tartrate 25 mg once daily (this was prescribed to take as needed for palpitations). She denies any recent palpitations. She states that her apple watch gives her weekly notifications that she is in atrial fibrillation 2% of the time, however, she was receiving these notifications when she wore the heart monitor in November 2022, with no documented evidence of atrial fibrillation. She does not have any reports from her apple watch available for review.  She is also taking Emgality for migraines and is optimistic that it is working for her. Despite overall improvement in her symptoms, she still verbalizes feelings of anxiety and states she is afraid to increase her activity (walk on her treadmill)  for fear that her heart will beat too fast.  She is seeing a therapist.  Otherwise, she denies any other concerns or complaints today.   Home Medications    Current Outpatient Medications  Medication Sig Dispense Refill   aspirin 81 MG tablet Take 81 mg by mouth daily.     CINNAMON PO Take 1,000 mg by mouth daily.      Coenzyme Q10 (CO Q-10) 200 MG CAPS Take 1 capsule by mouth daily.       famotidine (PEPCID) 20 MG tablet  Take 2 tablets (40 mg total) by mouth 2 (two) times daily. 360 tablet 1   fish oil-omega-3 fatty acids 1000 MG capsule Take 1.2 g by mouth 2 (two) times daily.     furosemide (LASIX) 20 MG tablet Take 1 tablet (20 mg total) by mouth daily. (Patient taking differently: Take 20 mg by mouth every other day. Take 1 Tablet on Monday, Wednesday, and Friday.) 90 tablet 1   Ginkgo Biloba 120 MG CAPS Take 1 capsule by mouth daily.       KEFLEX 250 MG capsule      levothyroxine (SYNTHROID) 100 MCG tablet Take 100 mcg by mouth daily before breakfast.     LIOTHYRONINE SODIUM PO 5 mg.      Magnesium 250 MG TABS Take 250 mg by mouth daily.     metoprolol tartrate (LOPRESSOR) 25 MG tablet TAKE 1 TABLET BY MOUTH DAILY AS NEEDED (PALPITATIONS) (Patient taking differently: Take 25 mg by mouth once. Take 1 Tablet Daily) 90 tablet 0   Multiple Vitamin (MULTIVITAMIN) capsule Take 1 capsule by mouth daily.     nitroGLYCERIN (NITROSTAT) 0.4 MG SL tablet DISSOLVE ONE TABLET UNDER THE TONGUE EVERY 5 MINUTES AS NEEDED FOR CHEST PAIN.  DO NOT EXCEED A TOTAL OF 3 DOSES IN 15 MINUTES 25 tablet 6   nystatin ointment (MYCOSTATIN) Apply 1 application topically 2 (two) times daily. To affected areas 15 g 1   ondansetron (ZOFRAN) 4 MG tablet Take 1 tablet (4 mg total) by mouth every 8 (eight) hours as needed for nausea or vomiting. 20 tablet 3   pantoprazole (PROTONIX) 40 MG tablet TAKE 1 TABLET BY MOUTH  TWICE DAILY 180 tablet 3   potassium chloride SA (KLOR-CON M) 20 MEQ tablet TAKE 2 TABLETS BY MOUTH TWICE DAILY AND ONE HALF EVERY DAY AT BEDTIME 405 tablet 1   propranolol ER (INDERAL LA) 60 MG 24 hr capsule Take 1 capsule by mouth once daily at bedtime 90 capsule 3   Riboflavin 400 MG TABS Take 400 mg by mouth daily.     Rimegepant Sulfate (NURTEC) 75 MG TBDP Take 75 mg by mouth every other day. 45 tablet 0   simvastatin (ZOCOR) 40 MG tablet TAKE 1 TABLET BY MOUTH AT  BEDTIME 90 tablet 3   topiramate (TOPAMAX) 100 MG tablet  Take 1 tablet (100 mg total) by mouth 2 (two) times daily. 180 tablet 1   triamcinolone cream (KENALOG) 0.1 % Apply 1 application topically 2 (two) times daily. 15 g 0   No current facility-administered medications for this visit.     Review of Systems   She denies chest pain, dyspnea, pnd, orthopnea, n, v, dizziness, syncope, edema, weight gain, or early satiety. All other systems reviewed and are otherwise negative except as noted above.   Physical Exam    VS:  BP (!) 116/52    Pulse (!) 58    Ht 5' 6.5" (1.689 m)  Wt 204 lb (92.5 kg)    SpO2 99%    BMI 32.43 kg/m   GEN: Well nourished, well developed, in no acute distress. HEENT: normal. Neck: Supple, no JVD, carotid bruits, or masses. Cardiac: RRR, no murmurs, rubs, or gallops. No clubbing, cyanosis, edema.  Radials/DP/PT 2+ and equal bilaterally.  Respiratory:  Respirations regular and unlabored, clear to auscultation bilaterally. GI: Soft, nontender, nondistended, BS + x 4. MS: no deformity or atrophy. Skin: warm and dry, no rash. Neuro:  Strength and sensation are intact. Psych: Normal affect.  Accessory Clinical Findings    ECG personally reviewed by me today - No EKG in office today.   Lab Results  Component Value Date   WBC 6.0 07/18/2020   HGB 13.4 07/18/2020   HCT 38.6 07/18/2020   MCV 90.0 07/18/2020   PLT 207.0 07/18/2020   Lab Results  Component Value Date   CREATININE 1.41 (H) 07/18/2020   BUN 23 07/18/2020   NA 140 07/18/2020   K 4.6 07/18/2020   CL 109 07/18/2020   CO2 23 07/18/2020   Lab Results  Component Value Date   ALT 39 (H) 07/18/2020   AST 34 07/18/2020   ALKPHOS 53 07/18/2020   BILITOT 1.6 (H) 07/18/2020   Lab Results  Component Value Date   CHOL 125 07/18/2020   HDL 41.20 07/18/2020   LDLCALC 68 04/06/2020   LDLDIRECT 62.0 07/18/2020   TRIG 274.0 (H) 07/18/2020   CHOLHDL 3 07/18/2020    Lab Results  Component Value Date   HGBA1C 5.8 07/18/2020    Assessment & Plan     1. CAD/chest pain: S/p remote DES. H/o recurrent chest pain. Repeat stress tests, including most recent in February 2020, were negative for ischemia. Stable with no anginal symptoms. No indication for ischemic evaluation at this time.  Continue aspirin, Lasix, metoprolol, propanolol, and simvastatin.  2. Palpitations: Monitor in November 2022 in the setting of palpitations showed NSR, runs of SVT lasting up to 1 minute and 49 seconds, no other significant arrhythmia. She is taking propanolol ER 60 mg daily in addition to metoprolol tartrate 25 mg once daily (this was prescribed to take as needed for palpitations). Apple watch gives her weekly notifications that she is in atrial fibrillation 2% of the time, she was receiving these notifications when she wore monitor, with no documented evidence of atrial fibrillation, no apple watch reports available for review.  I have asked her to provide [place] with rhythm strips from her watch to indicate atrial fibrillation.  She declines repeat outpatient monitoring at this time. Case discussed with Dr. Percival Spanish, DOD.  Given her symptoms are greatly improved on current combination of propanolol ER and metoprolol tartrate 25 mg in one dose daily, he states she may continue this combination for now.  She will continue to monitor her BP and heart rate.  I encouraged her to increase her activity as tolerated and monitor her symptoms.   3. Hypertension: BP well controlled. Continue current antihypertensive regimen.   4. Hyperlipidemia: dLDL was 62 in 06/2020.  Continue simvastatin.  5. CKD stage IIIa:  Most recent creatinine was 1.41 in June 2022.  Stable.  Follows with nephrology.   6. Disposition: Follow-up in 4-6 months with Dr. Percival Spanish.   Lenna Sciara, NP 02/22/2021, 4:49 PM

## 2021-02-22 NOTE — Patient Instructions (Signed)
Medication Instructions:  No Changes *If you need a refill on your cardiac medications before your next appointment, please call your pharmacy*   Lab Work: No Labs If you have labs (blood work) drawn today and your tests are completely normal, you will receive your results only by: [place] (if you have MyChart) OR A paper copy in the mail If you have any lab test that is abnormal or we need to change your treatment, we will call you to review the results.   Testing/Procedures: No Testing    Follow-Up: At [facility], you and your health needs are our priority.  As part of our continuing mission to provide you with exceptional heart care, we have created designated Provider Care Teams.  These Care Teams include your primary Cardiologist (physician) and Advanced Practice Providers (APPs -  Physician Assistants and Nurse Practitioners) who all work together to provide you with the care you need, when you need it.  We recommend signing up for the patient portal called "MyChart".  Sign up information is provided on this After Visit Summary.  MyChart is used to connect with patients for Virtual Visits (Telemedicine).  Patients are able to view lab/test results, encounter notes, upcoming appointments, etc.  Non-urgent messages can be sent to your provider as well.   To learn more about what you can do with MyChart, go to [email or web address].    Your next appointment:   3-4 month(s)  The format for your next appointment:   In Person  Provider:   Minus Breeding, MD

## 2021-03-07 DIAGNOSIS — H905 Unspecified sensorineural hearing loss: Secondary | ICD-10-CM | POA: Diagnosis not present

## 2021-03-11 ENCOUNTER — Other Ambulatory Visit: Payer: Self-pay | Admitting: Psychiatry

## 2021-03-11 DIAGNOSIS — G43009 Migraine without aura, not intractable, without status migrainosus: Secondary | ICD-10-CM

## 2021-03-11 DIAGNOSIS — G43719 Chronic migraine without aura, intractable, without status migrainosus: Secondary | ICD-10-CM

## 2021-03-12 ENCOUNTER — Other Ambulatory Visit: Payer: Self-pay

## 2021-03-12 MED ORDER — ONDANSETRON HCL 4 MG PO TABS: 4.0000 mg | ORAL_TABLET | Freq: Three times a day (TID) | ORAL | 3 refills | Status: DC | PRN

## 2021-03-14 ENCOUNTER — Other Ambulatory Visit: Payer: Self-pay | Admitting: Psychiatry

## 2021-03-14 DIAGNOSIS — G43009 Migraine without aura, not intractable, without status migrainosus: Secondary | ICD-10-CM

## 2021-03-14 DIAGNOSIS — G43719 Chronic migraine without aura, intractable, without status migrainosus: Secondary | ICD-10-CM

## 2021-03-26 ENCOUNTER — Other Ambulatory Visit: Payer: Self-pay | Admitting: *Deleted

## 2021-03-26 MED ORDER — TOPIRAMATE 100 MG PO TABS: 100.0000 mg | ORAL_TABLET | Freq: Two times a day (BID) | ORAL | 0 refills | Status: DC

## 2021-03-28 DIAGNOSIS — E119 Type 2 diabetes mellitus without complications: Secondary | ICD-10-CM | POA: Diagnosis not present

## 2021-03-28 DIAGNOSIS — E890 Postprocedural hypothyroidism: Secondary | ICD-10-CM | POA: Diagnosis not present

## 2021-04-03 ENCOUNTER — Other Ambulatory Visit: Payer: Self-pay

## 2021-04-03 DIAGNOSIS — C50411 Malignant neoplasm of upper-outer quadrant of right female breast: Secondary | ICD-10-CM

## 2021-04-03 DIAGNOSIS — Z170 Estrogen receptor positive status [ER+]: Secondary | ICD-10-CM

## 2021-04-03 NOTE — Progress Notes (Signed)
[place]   [phone] Fax:(336) [phone]   Clinic Follow up Note   Patient Care Team: Tower, Wynelle Fanny, MD as PCP - General Minus Breeding, MD as PCP - Cardiology (Cardiology) Minus Breeding, MD as Consulting Physician (Cardiology) Holley Bouche, NP (Inactive) as Nurse Practitioner (Nurse Practitioner) Erroll Luna, MD as Consulting Physician (General Surgery) Thea Silversmith, MD as Consulting Physician (Radiation Oncology) Truitt Merle, MD as Consulting Physician (Hematology) Susa Day, MD as Consulting Physician (Orthopedic Surgery) 04/04/2021  CHIEF COMPLAINT: Follow-up right breast cancer  SUMMARY OF ONCOLOGIC HISTORY: Oncology History Overview Note  Cancer Staging Breast cancer of upper-outer quadrant of right female breast [facility] Staging form: Breast, AJCC 7th Edition - Clinical stage from 01/04/2014: Stage IA (T1b, N0, M0) - Unsigned - Pathologic stage from 02/14/2014: Stage IA (T1b, N0, cM0) - Unsigned     Breast cancer of upper-outer quadrant of right female breast ([place])  12/29/2013 Imaging   Screening mammogram and [place]: an irregular shadowing hypoechoic mass right breast 9 o'clock location 4 cm from the nipple measuring 7 x 7 x 7 mm. No adenopathy    12/29/2013 Initial Diagnosis   Breast cancer of upper-outer quadrant of right female breast   12/29/2013 Initial Biopsy   Grade I-II IDC, ER+ (98%), PR+ (81%), HER2- (ratio 1.24), Ki67 15%    01/11/2014 Procedure   Genetic counseling/testing: Revealed 1 VUS in ATM gene, p.D1641H (c.4921G>C).  Otherwise, genetic testing negative.    02/14/2014 Surgery   Right lumpectomy with SLNB ([place]): Grade 2 IDC spanning 0.9 cm with associated grade 2 DCIS.  Negative margins. 5 sentinel lymph nodes negative.  HER2 repeated and neg. (ratio 1.16).   02/14/2014 Pathologic Stage   pT1bpN0; Stage IA   02/14/2014 Oncotype testing   Recurrence score 21 (13% risk of distant recurrence); no adjuvant  chemo offered.    03/22/2014 - 04/19/2014 Radiation Therapy   Adjuvant radiation Pablo Ledger); Right breast: Total dose 42.72 Gy over 21 fractions. Right breast boost: Total dose 10 Gy over 5 fractions.     Anti-estrogen oral therapy   Anti-estrogen therapy was recommended by Dr. Burr Medico; pt declines anti-estrogen treatment.     05/25/2014 Survivorship   Survivorship Care Plan given to patient and reviewed with her in-person.    05/30/2016 Imaging   US Abdomen 05/30/16 IMPRESSION: Status post cholecystectomy. Pancreas not visualized due to overlying bowel gas. Increased echogenicity of hepatic parenchyma is noted diffusely suggesting fatty infiltration or other diffuse hepatocellular disease.   07/02/2016 Imaging   DEXA 07/02/16 T score of -1.8, indicating osteopenia   12/15/2016 Mammogram   IMPRESSION: 1. No mammographic evidence of malignancy in either breast. 2. Stable right breast post lumpectomy changes.     CURRENT THERAPY: Surveillance  INTERVAL HISTORY: Ms. Trulson returns for follow-up as scheduled, last seen by Dr. Burr Medico 04/06/2020.  Mammogram 12/26/2020 was negative.  She continues to report low left breast/chest wall pain.  She thought it was her heart, cardiology did not feel this was cardiac.  She thinks may be an old back injury is causing referred pain.  She occasionally feels bad with nausea from this.  Denies new breast mass/lump, nipple discharge or inversion, or skin change.  She has "ghost pains" that move from her ankles, wrist, hand, left hip.  She was exercising but became afraid of falling off the treadmill so she is less active.  Her weight fluctuates.  She is seeing a kidney specialist now has stage III CKD, some meds were  adjusted.  She is dealing with chronic UTI-like symptoms, in the past 2 months she has dysuria, urgency, and pelvic pressure.  She takes daily Keflex for prophylaxis which she does not think is helping as much.   MEDICAL HISTORY:  Past Medical History:   Diagnosis Date   Anxiety    Breast cancer ([place])    ER+/PR+/Her2-   Concussion 05/26/14   Depression    Frequent UTI    GERD (gastroesophageal reflux disease)    Hypothyroid    Microscopic hematuria    Migraine    Personal history of radiation therapy    Radiation 03/22/14-04/19/14   Breast Cancer upper-outer   Restless leg syndrome    Seasonal allergies    Stented coronary artery    LAD 95% 2007.   Vitamin D deficiency     SURGICAL HISTORY: Past Surgical History:  Procedure Laterality Date   ABDOMINAL HYSTERECTOMY     APPENDECTOMY     back surgery  1997   herniated disc repair   BREAST LUMPECTOMY Right    2016   CARDIAC CATHETERIZATION  2007   stent   CHOLECYSTECTOMY OPEN     CYSTOSCOPY     neg   RADIOACTIVE SEED GUIDED PARTIAL MASTECTOMY WITH AXILLARY SENTINEL LYMPH NODE BIOPSY Right 02/14/2014   Procedure: RADIOACTIVE SEED GUIDED PARTIAL MASTECTOMY WITH AXILLARY SENTINEL LYMPH NODE BIOPSY;  Surgeon: Erroll Luna, MD;  Location: [place];  Service: General;  Laterality: Right;   SHOULDER SURGERY Right     I have reviewed the social history and family history with the patient and they are unchanged from previous note.  ALLERGIES:  is allergic to iohexol.  MEDICATIONS:  Current Outpatient Medications  Medication Sig Dispense Refill   aspirin 81 MG tablet Take 81 mg by mouth daily.     CINNAMON PO Take 1,000 mg by mouth daily.      Coenzyme Q10 (CO Q-10) 200 MG CAPS Take 1 capsule by mouth daily.       famotidine (PEPCID) 20 MG tablet Take 2 tablets (40 mg total) by mouth 2 (two) times daily. 360 tablet 1   fish oil-omega-3 fatty acids 1000 MG capsule Take 1.2 g by mouth 2 (two) times daily.     furosemide (LASIX) 20 MG tablet Take 1 tablet (20 mg total) by mouth daily. (Patient taking differently: Take 20 mg by mouth every other day. Take 1 Tablet on Monday, Wednesday, and Friday.) 90 tablet 1   Ginkgo Biloba 120 MG CAPS Take 1 capsule by mouth  daily.       KEFLEX 250 MG capsule      levothyroxine (SYNTHROID) 100 MCG tablet Take 100 mcg by mouth daily before breakfast.     LIOTHYRONINE SODIUM PO 5 mg.      Magnesium 250 MG TABS Take 250 mg by mouth daily.     metoprolol tartrate (LOPRESSOR) 25 MG tablet TAKE 1 TABLET BY MOUTH DAILY AS NEEDED (PALPITATIONS) (Patient taking differently: Take 25 mg by mouth once. Take 1 Tablet Daily) 90 tablet 0   Multiple Vitamin (MULTIVITAMIN) capsule Take 1 capsule by mouth daily.     nitroGLYCERIN (NITROSTAT) 0.4 MG SL tablet DISSOLVE ONE TABLET UNDER THE TONGUE EVERY 5 MINUTES AS NEEDED FOR CHEST PAIN.  DO NOT EXCEED A TOTAL OF 3 DOSES IN 15 MINUTES 25 tablet 6   nystatin ointment (MYCOSTATIN) Apply 1 application topically 2 (two) times daily. To affected areas 15 g 1   ondansetron (ZOFRAN) 4  MG tablet Take 1 tablet (4 mg total) by mouth every 8 (eight) hours as needed for nausea or vomiting. 20 tablet 3   pantoprazole (PROTONIX) 40 MG tablet TAKE 1 TABLET BY MOUTH  TWICE DAILY 180 tablet 3   potassium chloride SA (KLOR-CON M) 20 MEQ tablet TAKE 2 TABLETS BY MOUTH TWICE DAILY AND ONE HALF EVERY DAY AT BEDTIME 405 tablet 1   propranolol ER (INDERAL LA) 60 MG 24 hr capsule Take 1 capsule by mouth once daily at bedtime 90 capsule 3   Riboflavin 400 MG TABS Take 400 mg by mouth daily.     Rimegepant Sulfate (NURTEC) 75 MG TBDP DISSOLVE 1 TABLET ON THE TONGUE  EVERY OTHER DAY 45 tablet 0   simvastatin (ZOCOR) 40 MG tablet TAKE 1 TABLET BY MOUTH AT  BEDTIME 90 tablet 3   topiramate (TOPAMAX) 100 MG tablet Take 1 tablet (100 mg total) by mouth 2 (two) times daily. 180 tablet 0   triamcinolone cream (KENALOG) 0.1 % Apply 1 application topically 2 (two) times daily. 15 g 0   No current facility-administered medications for this visit.    PHYSICAL EXAMINATION:  Vitals:   04/04/21 1227  BP: 134/65  Pulse: 61  Resp: 18  Temp: 98 F (36.7 C)  SpO2: 99%   Filed Weights   04/04/21 1227  Weight: 204 lb  3.2 oz (92.6 kg)    GENERAL:alert, no distress and comfortable SKIN: No rash  EYES: sclera clear NECK: Without mass LYMPH:  no palpable cervical or supraclavicular lymphadenopathy LUNGS: clear with normal breathing effort HEART: regular rate & rhythm, no lower extremity edema ABDOMEN:abdomen soft, non-tender and normal bowel sounds Musculoskeletal: No focal spinal tenderness NEURO: alert & oriented x 3 with fluent speech, no focal motor/sensory deficits Breast exam: Breasts are symmetrical without nipple discharge or inversion.  S/p right lumpectomy, incisions completely healed with moderate scar tissue in the lower outer right breast.  Dense breast tissue without palpable mass or nodularity in either breast or axilla that I could appreciate.  LABORATORY DATA:  I have reviewed the data as listed CBC Latest Ref Rng & Units 04/04/2021 07/18/2020 04/06/2020  WBC 4.0 - 10.5 K/uL 5.6 6.0 5.8  Hemoglobin 12.0 - 15.0 g/dL 13.5 13.4 13.2  Hematocrit 36.0 - 46.0 % 38.6 38.6 39.6  Platelets 150 - 400 K/uL 175 207.0 171     CMP Latest Ref Rng & Units 04/04/2021 07/18/2020 04/06/2020  Glucose 70 - 99 mg/dL 153(H) 112(H) 131(H)  BUN 8 - 23 mg/dL '22 23 20  ' Creatinine 0.44 - 1.00 mg/dL 1.22(H) 1.41(H) 1.43(H)  Sodium 135 - 145 mmol/L 141 140 143  Potassium 3.5 - 5.1 mmol/L 4.1 4.6 4.5  Chloride 98 - 111 mmol/L 109 109 113(H)  CO2 22 - 32 mmol/L '25 23 22  ' Calcium 8.9 - 10.3 mg/dL 10.2 9.9 9.7  Total Protein 6.5 - 8.1 g/dL 7.1 6.9 7.3  Total Bilirubin 0.3 - 1.2 mg/dL 1.4(H) 1.6(H) 1.6(H)  Alkaline Phos 38 - 126 U/L 54 53 56  AST 15 - 41 U/L 28 34 36  ALT 0 - 44 U/L 32 39(H) 40      RADIOGRAPHIC STUDIES: I have personally reviewed the radiological images as listed and agreed with the findings in the report. No results found.   ASSESSMENT & PLAN: [name] [name] is a 66 y.o. female with    1.  pT1b N0 M0 stage IA invasive ductal carcinoma of right breast, grade II, ER/PR strongly  positive,  HER-2 negative, Oncotype RS 21 -diagnosed in 12/2013. She is s/p right breast lumpectomy AND adjuvant breast radiation. She declined adjuvant antiestrogen therapy, due to the concern of side effects. -on surveillance  -11/2020 was negative -She prefers to continue annual surveillance in our clinic, follow-up in 1 year  2.  Dysuria, urinary urgency, pelvic pressure -She is on daily Keflex for UTI prophylaxis -Symptoms have been going on for 2 months -We will check UA/culture today to see if she needs alternative antibiotic.  I will call her with results   3. Left breast and rib pain, intermittent LUQ abdominal pain, nausea over 1 year -she wasn't aware of this pain until my exam, she attributed left breast heaviness to ongoing LUQ pain -Previous work-up has been negative, pain is chronic and unchanged.  Today's breast exam is benign -She is on Protonix and Pepcid BID for GERD -Colonoscopy and endoscopy 05/2020 by Dr. Arelia Longest were unremarkable, she underwent esophageal dilation at the time  -Symptoms are mild and overall stable, continue PCP follow-up   4. Osteopenia -last DEXA was in 06/2016, no high risk of fracture. -DEXA 04/21/19 showed osteopenia T score -2.3 at left femur neck -I encouraged her to optimize calcium and vitamin D and increase weight bearing exercises    5. Mild hyperbilirubinemia  -She has had mild intermittent elevated total bilirubin since 01/2015, liver enzymes are normal -stable, 1.4 today   6. CKD, Stage III -Follow-up nephrology  Disposition: Ms. Crowell is doing well from a breast cancer standpoint.  Breast exam is benign, CBC is normal, CMP is stable with CKD and mild hyperbilirubinemia.  Mammogram 11/2020 is negative.  Overall there is no clinical concern for recurrent or new breast cancer.  She prefers to continue surveillance in our clinic.  Next mammo 11/2021.  She will return for lab and follow-up in 1 year, or sooner if needed.  I encouraged her to  continue age-appropriate health maintenance and follow-up with her care team.  We will check UA/culture for urinary symptoms today, I will call her with results.    Orders Placed This Encounter  Procedures   Urine Culture    Standing Status:   Future    Number of Occurrences:   1    Standing Expiration Date:   04/04/2022   MM 3D SCREEN BREAST BILATERAL    Standing Status:   Future    Standing Expiration Date:   04/05/2022    Order Specific Question:   Reason for Exam (SYMPTOM  OR DIAGNOSIS REQUIRED)    Answer:   h/o R breast cancer 2015    Order Specific Question:   Preferred imaging location?    Answer:   [facility]   Urinalysis, Complete w Microscopic    Standing Status:   Future    Number of Occurrences:   1    Standing Expiration Date:   04/05/2022   All questions were answered. The patient knows to call the clinic with any problems, questions or concerns. No barriers to learning was detected. I spent 20 minutes counseling the patient face to face. The total time spent in the appointment was 30 minutes and more than 50% was on counseling and review of test results     Alla Feeling, NP 04/04/21

## 2021-04-04 ENCOUNTER — Other Ambulatory Visit: Payer: Medicare Other

## 2021-04-04 ENCOUNTER — Inpatient Hospital Stay: Payer: Medicare Other | Attending: Nurse Practitioner

## 2021-04-04 ENCOUNTER — Ambulatory Visit: Payer: Medicare Other | Admitting: Nurse Practitioner

## 2021-04-04 ENCOUNTER — Other Ambulatory Visit: Payer: Self-pay

## 2021-04-04 ENCOUNTER — Inpatient Hospital Stay: Payer: Medicare Other

## 2021-04-04 ENCOUNTER — Encounter: Payer: Self-pay | Admitting: Nurse Practitioner

## 2021-04-04 ENCOUNTER — Inpatient Hospital Stay (HOSPITAL_BASED_OUTPATIENT_CLINIC_OR_DEPARTMENT_OTHER): Payer: Medicare Other | Admitting: Nurse Practitioner

## 2021-04-04 VITALS — BP 134/65 | HR 61 | Temp 98.0°F | Resp 18 | Ht 66.5 in | Wt 204.2 lb

## 2021-04-04 DIAGNOSIS — Z170 Estrogen receptor positive status [ER+]: Secondary | ICD-10-CM

## 2021-04-04 DIAGNOSIS — K219 Gastro-esophageal reflux disease without esophagitis: Secondary | ICD-10-CM | POA: Insufficient documentation

## 2021-04-04 DIAGNOSIS — R0781 Pleurodynia: Secondary | ICD-10-CM | POA: Insufficient documentation

## 2021-04-04 DIAGNOSIS — Z923 Personal history of irradiation: Secondary | ICD-10-CM | POA: Diagnosis not present

## 2021-04-04 DIAGNOSIS — R300 Dysuria: Secondary | ICD-10-CM | POA: Insufficient documentation

## 2021-04-04 DIAGNOSIS — R3915 Urgency of urination: Secondary | ICD-10-CM | POA: Diagnosis not present

## 2021-04-04 DIAGNOSIS — Z87440 Personal history of urinary (tract) infections: Secondary | ICD-10-CM | POA: Insufficient documentation

## 2021-04-04 DIAGNOSIS — Z853 Personal history of malignant neoplasm of breast: Secondary | ICD-10-CM | POA: Insufficient documentation

## 2021-04-04 DIAGNOSIS — M85852 Other specified disorders of bone density and structure, left thigh: Secondary | ICD-10-CM | POA: Insufficient documentation

## 2021-04-04 DIAGNOSIS — R110 Nausea: Secondary | ICD-10-CM | POA: Insufficient documentation

## 2021-04-04 DIAGNOSIS — Z1231 Encounter for screening mammogram for malignant neoplasm of breast: Secondary | ICD-10-CM

## 2021-04-04 DIAGNOSIS — C50411 Malignant neoplasm of upper-outer quadrant of right female breast: Secondary | ICD-10-CM | POA: Insufficient documentation

## 2021-04-04 DIAGNOSIS — Z9049 Acquired absence of other specified parts of digestive tract: Secondary | ICD-10-CM | POA: Diagnosis not present

## 2021-04-04 DIAGNOSIS — N183 Chronic kidney disease, stage 3 unspecified: Secondary | ICD-10-CM | POA: Diagnosis not present

## 2021-04-04 DIAGNOSIS — Z79899 Other long term (current) drug therapy: Secondary | ICD-10-CM | POA: Insufficient documentation

## 2021-04-04 DIAGNOSIS — R1012 Left upper quadrant pain: Secondary | ICD-10-CM | POA: Insufficient documentation

## 2021-04-04 LAB — URINALYSIS, COMPLETE (UACMP) WITH MICROSCOPIC
Bilirubin Urine: NEGATIVE
Glucose, UA: NEGATIVE mg/dL
Hgb urine dipstick: NEGATIVE
Ketones, ur: NEGATIVE mg/dL
Nitrite: NEGATIVE
Protein, ur: NEGATIVE mg/dL
Specific Gravity, Urine: 1.018 (ref 1.005–1.030)
pH: 5 (ref 5.0–8.0)

## 2021-04-04 LAB — CMP (CANCER CENTER ONLY)
ALT: 32 U/L (ref 0–44)
AST: 28 U/L (ref 15–41)
Albumin: 4.4 g/dL (ref 3.5–5.0)
Alkaline Phosphatase: 54 U/L (ref 38–126)
Anion gap: 7 (ref 5–15)
BUN: 22 mg/dL (ref 8–23)
CO2: 25 mmol/L (ref 22–32)
Calcium: 10.2 mg/dL (ref 8.9–10.3)
Chloride: 109 mmol/L (ref 98–111)
Creatinine: 1.22 mg/dL — ABNORMAL HIGH (ref 0.44–1.00)
GFR, Estimated: 49 mL/min — ABNORMAL LOW (ref >60)
Glucose, Bld: 153 mg/dL — ABNORMAL HIGH (ref 70–99)
Potassium: 4.1 mmol/L (ref 3.5–5.1)
Sodium: 141 mmol/L (ref 135–145)
Total Bilirubin: 1.4 mg/dL — ABNORMAL HIGH (ref 0.3–1.2)
Total Protein: 7.1 g/dL (ref 6.5–8.1)

## 2021-04-04 LAB — CBC WITH DIFFERENTIAL (CANCER CENTER ONLY)
Abs Immature Granulocytes: 0.01 10*3/uL (ref 0.00–0.07)
Basophils Absolute: 0 10*3/uL (ref 0.0–0.1)
Basophils Relative: 1 %
Eosinophils Absolute: 0.2 10*3/uL (ref 0.0–0.5)
Eosinophils Relative: 3 %
HCT: 38.6 % (ref 36.0–46.0)
Hemoglobin: 13.5 g/dL (ref 12.0–15.0)
Immature Granulocytes: 0 %
Lymphocytes Relative: 27 %
Lymphs Abs: 1.5 10*3/uL (ref 0.7–4.0)
MCH: 30.9 pg (ref 26.0–34.0)
MCHC: 35 g/dL (ref 30.0–36.0)
MCV: 88.3 fL (ref 80.0–100.0)
Monocytes Absolute: 0.5 10*3/uL (ref 0.1–1.0)
Monocytes Relative: 8 %
Neutro Abs: 3.4 10*3/uL (ref 1.7–7.7)
Neutrophils Relative %: 61 %
Platelet Count: 175 10*3/uL (ref 150–400)
RBC: 4.37 MIL/uL (ref 3.87–5.11)
RDW: 12.9 % (ref 11.5–15.5)
WBC Count: 5.6 10*3/uL (ref 4.0–10.5)
nRBC: 0 % (ref 0.0–0.2)

## 2021-04-07 LAB — CARBAPENEM RESISTANCE PANEL
Carba Resistance IMP Gene: NOT DETECTED
Carba Resistance KPC Gene: NOT DETECTED
Carba Resistance NDM Gene: NOT DETECTED
Carba Resistance OXA48 Gene: NOT DETECTED
Carba Resistance VIM Gene: NOT DETECTED

## 2021-04-07 LAB — URINE CULTURE: Culture: 50000 — AB

## 2021-04-08 ENCOUNTER — Telehealth: Payer: Self-pay | Admitting: *Deleted

## 2021-04-08 ENCOUNTER — Other Ambulatory Visit: Payer: Self-pay | Admitting: Nurse Practitioner

## 2021-04-08 DIAGNOSIS — H903 Sensorineural hearing loss, bilateral: Secondary | ICD-10-CM | POA: Diagnosis not present

## 2021-04-08 MED ORDER — CIPROFLOXACIN HCL 500 MG PO TABS: 500.0000 mg | ORAL_TABLET | Freq: Two times a day (BID) | ORAL | 0 refills | Status: DC

## 2021-04-08 NOTE — Telephone Encounter (Signed)
Per Hendricks Limes, called pt with message below. Pt verbalized understanding.  ?

## 2021-04-08 NOTE — Telephone Encounter (Signed)
-----   Message from Alla Feeling, NP sent at 04/08/2021  1:22 PM EDT ----- ?Please let pt know she has a UTI, I called in cipro BID x5 days. She can stop keflex while she is on cipro. If she does not tolerate Cipro, please let me know.  ? ?Thanks, ?Regan Rakers, NP ?

## 2021-04-11 DIAGNOSIS — E559 Vitamin D deficiency, unspecified: Secondary | ICD-10-CM | POA: Diagnosis not present

## 2021-04-11 DIAGNOSIS — E890 Postprocedural hypothyroidism: Secondary | ICD-10-CM | POA: Diagnosis not present

## 2021-04-16 DIAGNOSIS — H903 Sensorineural hearing loss, bilateral: Secondary | ICD-10-CM | POA: Diagnosis not present

## 2021-04-24 ENCOUNTER — Encounter: Payer: Self-pay | Admitting: Neurology

## 2021-04-24 ENCOUNTER — Ambulatory Visit (INDEPENDENT_AMBULATORY_CARE_PROVIDER_SITE_OTHER): Payer: Medicare Other | Admitting: Neurology

## 2021-04-24 ENCOUNTER — Telehealth: Payer: Self-pay | Admitting: Neurology

## 2021-04-24 DIAGNOSIS — G43009 Migraine without aura, not intractable, without status migrainosus: Secondary | ICD-10-CM | POA: Diagnosis not present

## 2021-04-24 DIAGNOSIS — G43719 Chronic migraine without aura, intractable, without status migrainosus: Secondary | ICD-10-CM | POA: Diagnosis not present

## 2021-04-24 MED ORDER — NURTEC 75 MG PO TBDP: ORAL_TABLET | ORAL | 1 refills | Status: DC

## 2021-04-24 MED ORDER — TOPIRAMATE 100 MG PO TABS: 100.0000 mg | ORAL_TABLET | Freq: Two times a day (BID) | ORAL | 3 refills | Status: DC

## 2021-04-24 NOTE — Progress Notes (Signed)
? ? ?PATIENT: [name] [name] ?DOB: 1955-08-04 ? ?REASON FOR VISIT: Follow up for migraine ?HISTORY FROM: Patient ?PRIMARY NEUROLOGIST: Dr. Billey Gosling ? ?ASSESSMENT AND PLAN ?66 y.o. year old female  ? ?1.  Intractable migraine headache ? ?-We will try to get approval for Botox, reportedly has been told her insurance should cover, was injected back in 2016, only 1 time, she thinks was somewhat helpful ?-In the past has tried and failed Effexor, Topamax, Inderal, nortriptyline, Imitrex, Maxalt, Aimovig, Ajovy, Emgality, Depakote, gabapentin, Ubrelvy ?-For now, we will continue on Nurtec, Topamax as prevention, Zofran as needed for nausea with headache  ?-Follow-up in 6 months or sooner if needed, will now be followed by Dr. Billey Gosling ? ?HISTORY OF PRESENT ILLNESS: ?Today 04/24/21 ?[place] here today for follow-up. Takes Nurtec every other day. Lessens the intensity of headaches. Reduces the pain to a "numb ache", it still hurts. Only 2 severe migraines since seeing Dr. Jannifer Franklin. Doesn't like the numbing sensation she feels when she gets headache as result of Nurtec. Cool weather is a trigger. Takes Zofran, nauseated from [place]. Takes Aleve 3 days a week. Has daily headache, mild to moderate intensity.  ? ?HISTORY ?09/26/2020 Dr. Jannifer Franklin: [name] [name] is a 66 year old right-handed white female with a history of intractable migraine headache.  The patient has been on multiple medications in the past for her migraine.  She recently was placed on Ajovy, but only took an injection or 2 and stopped the medication because she said it made her slightly dizzy for 2 to 3 days after the injection.  She was on Ubrelvy as a rescue drug which did help some, but she claims that Aleve does just as well.  She does have 4 or 5 days with headache a week, she may have 1 or 2 severe headaches a month that may be more incapacitating associated with nausea vomiting, photophobia and phonophobia.  Her headaches usually last all day long.  She returns  to the office today for further evaluation.  Her headaches are usually worse in the cooler months of the year. ? ?REVIEW OF SYSTEMS: Out of a complete 14 system review of symptoms, the patient complains only of the following symptoms, and all other reviewed systems are negative. ? ?See HPI ? ?ALLERGIES: ?Allergies  ?Allergen Reactions  ? Iohexol   ?   Desc: throat selling resp distress hives.premedicate pt.entered 07/06/04, bsw. ?  ? ? ?HOME MEDICATIONS: ?Outpatient Medications Prior to Visit  ?Medication Sig Dispense Refill  ? aspirin 81 MG tablet Take 81 mg by mouth daily.    ? CINNAMON PO Take 1,000 mg by mouth daily.     ? Coenzyme Q10 (CO Q-10) 200 MG CAPS Take 1 capsule by mouth daily.      ? famotidine (PEPCID) 20 MG tablet Take 2 tablets (40 mg total) by mouth 2 (two) times daily. 360 tablet 1  ? fish oil-omega-3 fatty acids 1000 MG capsule Take 1.2 g by mouth 2 (two) times daily.    ? furosemide (LASIX) 20 MG tablet Take 1 tablet (20 mg total) by mouth daily. (Patient taking differently: Take 20 mg by mouth every other day. Take 1 Tablet on Monday, Wednesday, and Friday.) 90 tablet 1  ? Ginkgo Biloba 120 MG CAPS Take 1 capsule by mouth daily.      ? KEFLEX 250 MG capsule     ? levothyroxine (SYNTHROID) 100 MCG tablet Take 100 mcg by mouth daily before breakfast.    ? LIOTHYRONINE SODIUM PO  5 mg.     ? Magnesium 250 MG TABS Take 250 mg by mouth daily.    ? metoprolol tartrate (LOPRESSOR) 25 MG tablet TAKE 1 TABLET BY MOUTH DAILY AS NEEDED (PALPITATIONS) (Patient taking differently: Take 25 mg by mouth once. Take 1 Tablet Daily) 90 tablet 0  ? Multiple Vitamin (MULTIVITAMIN) capsule Take 1 capsule by mouth daily.    ? nitroGLYCERIN (NITROSTAT) 0.4 MG SL tablet DISSOLVE ONE TABLET UNDER THE TONGUE EVERY 5 MINUTES AS NEEDED FOR CHEST PAIN.  DO NOT EXCEED A TOTAL OF 3 DOSES IN 15 MINUTES 25 tablet 6  ? ondansetron (ZOFRAN) 4 MG tablet Take 1 tablet (4 mg total) by mouth every 8 (eight) hours as needed for nausea  or vomiting. 20 tablet 3  ? pantoprazole (PROTONIX) 40 MG tablet TAKE 1 TABLET BY MOUTH  TWICE DAILY 180 tablet 3  ? potassium chloride SA (KLOR-CON M) 20 MEQ tablet TAKE 2 TABLETS BY MOUTH TWICE DAILY AND ONE HALF EVERY DAY AT BEDTIME 405 tablet 1  ? propranolol ER (INDERAL LA) 60 MG 24 hr capsule Take 1 capsule by mouth once daily at bedtime 90 capsule 3  ? Riboflavin 400 MG TABS Take 400 mg by mouth daily.    ? simvastatin (ZOCOR) 40 MG tablet TAKE 1 TABLET BY MOUTH AT  BEDTIME 90 tablet 3  ? Rimegepant Sulfate (NURTEC) 75 MG TBDP DISSOLVE 1 TABLET ON THE TONGUE  EVERY OTHER DAY 45 tablet 0  ? topiramate (TOPAMAX) 100 MG tablet Take 1 tablet (100 mg total) by mouth 2 (two) times daily. 180 tablet 0  ? ciprofloxacin (CIPRO) 500 MG tablet Take 1 tablet (500 mg total) by mouth 2 (two) times daily. 10 tablet 0  ? nystatin ointment (MYCOSTATIN) Apply 1 application topically 2 (two) times daily. To affected areas 15 g 1  ? triamcinolone cream (KENALOG) 0.1 % Apply 1 application topically 2 (two) times daily. 15 g 0  ? ?No facility-administered medications prior to visit.  ? ? ?PAST MEDICAL HISTORY: ?Past Medical History:  ?Diagnosis Date  ? Anxiety   ? Breast cancer ([place])   ? ER+/PR+/Her2-  ? Concussion 05/26/14  ? Depression   ? Frequent UTI   ? GERD (gastroesophageal reflux disease)   ? Hypothyroid   ? Microscopic hematuria   ? Migraine   ? Personal history of radiation therapy   ? Radiation 03/22/14-04/19/14  ? Breast Cancer upper-outer  ? Restless leg syndrome   ? Seasonal allergies   ? Stented coronary artery   ? LAD 95% 2007.  ? Vitamin D deficiency   ? ? ?PAST SURGICAL HISTORY: ?Past Surgical History:  ?Procedure Laterality Date  ? ABDOMINAL HYSTERECTOMY    ? APPENDECTOMY    ? back surgery  1997  ? herniated disc repair  ? BREAST LUMPECTOMY Right   ? 2016  ? CARDIAC CATHETERIZATION  2007  ? stent  ? CHOLECYSTECTOMY OPEN    ? CYSTOSCOPY    ? neg  ? RADIOACTIVE SEED GUIDED PARTIAL MASTECTOMY WITH AXILLARY SENTINEL  LYMPH NODE BIOPSY Right 02/14/2014  ? Procedure: RADIOACTIVE SEED GUIDED PARTIAL MASTECTOMY WITH AXILLARY SENTINEL LYMPH NODE BIOPSY;  Surgeon: Erroll Luna, MD;  Location: [place];  Service: General;  Laterality: Right;  ? SHOULDER SURGERY Right   ? ? ?FAMILY HISTORY: ?Family History  ?Problem Relation Age of Onset  ? Coronary artery disease Mother   ? Thyroid disease Mother   ? Alzheimer's disease Mother   ? Diabetes  Mother   ? Heart disease Mother   ? Melanoma Brother   ? Ovarian cancer Paternal Aunt 86  ? Bone cancer Paternal Uncle   ? Alzheimer's disease Maternal Grandmother   ? Heart disease Maternal Grandfather   ? Pancreatic cancer Maternal Grandfather 22  ? Ovarian cancer Maternal Aunt   ?     dx in 70s  ? Throat cancer Maternal Uncle   ?     smoker  ? Lung cancer Maternal Uncle   ?     heavy smoker  ? Ovarian cancer Paternal Aunt   ?     dx in her 95s  ? Cancer Cousin   ?     "female cancer"  ? Melanoma Father   ? ? ?SOCIAL HISTORY: ?Social History  ? ?Socioeconomic History  ? Marital status: Widowed  ?  Spouse name: Not on file  ? Number of children: 1  ? Years of education: some coll.  ? Highest education level: Not on file  ?Occupational History  ? Occupation: not working  ?Tobacco Use  ? Smoking status: Never  ? Smokeless tobacco: Never  ?Vaping Use  ? Vaping Use: Never used  ?Substance and Sexual Activity  ? Alcohol use: No  ?  Alcohol/week: 0.0 standard drinks  ?  Comment: socially  ? Drug use: No  ? Sexual activity: Not Currently  ?Other Topics Concern  ? Not on file  ?Social History Narrative  ? Does not get any regular exercise  ?   ? Doctors  ? -uro-[place]  ? -cardio--hochrein  ? -Neurology--Dr. Jannifer Franklin  ? -allergies--Dr. Carmelina Peal  ? Gyn past--Dr. Warnell Forester  ? Couselor--Dr. Rica Mote  ? Endo-- Dr. Chalmers Cater  ?   ? Lives with son - not working only child  ? Patient is right handed.  ? Patient drinks 1-2 cups of caffeine daily.  ?   ? Retired Veterinary surgeon work  ? ?Social Determinants of Health   ? ?Financial Resource Strain: Not on file  ?Food Insecurity: Not on file  ?Transportation Needs: Not on file  ?Physical Activity: Not on file  ?Stress: Not on file  ?Social Connections: Not on file  ?

## 2021-04-24 NOTE — Telephone Encounter (Signed)
Can we work on getting the patient approved for Botox for chronic migraine headaches? See office note for tried and failed.  ?

## 2021-04-24 NOTE — Patient Instructions (Signed)
Will work on getting Botox approval for migraine ?Continue current medications ?See you back in 6 months ?

## 2021-04-30 DIAGNOSIS — N3020 Other chronic cystitis without hematuria: Secondary | ICD-10-CM | POA: Diagnosis not present

## 2021-04-30 DIAGNOSIS — N3281 Overactive bladder: Secondary | ICD-10-CM | POA: Diagnosis not present

## 2021-05-01 ENCOUNTER — Telehealth: Payer: Self-pay | Admitting: Neurology

## 2021-05-01 NOTE — Telephone Encounter (Signed)
Started PA on [facility] website for Botox, [name] [name] is pending. Auth # [id number]. ?

## 2021-05-02 MED ORDER — BOTOX 100 UNITS IJ SOLR: INTRAMUSCULAR | 3 refills | Status: DC

## 2021-05-02 NOTE — Telephone Encounter (Signed)
Lm to get Botox appt scheduled. ?

## 2021-05-02 NOTE — Addendum Note (Signed)
Addended by: Noberto Retort C on: 05/02/2021 09:18 AM ? ? Modules accepted: Orders ? ?

## 2021-05-02 NOTE — Telephone Encounter (Signed)
Received approval from [facility], [place] # [id number] (05/01/2021-05/02/2022). ? ?

## 2021-05-02 NOTE — Telephone Encounter (Signed)
Rx sent to requested pharmacy

## 2021-05-02 NOTE — Telephone Encounter (Signed)
Please send Botox Rx to [place]. ?

## 2021-05-05 ENCOUNTER — Other Ambulatory Visit: Payer: Self-pay | Admitting: Family Medicine

## 2021-05-11 ENCOUNTER — Other Ambulatory Visit: Payer: Self-pay | Admitting: Cardiology

## 2021-05-12 ENCOUNTER — Other Ambulatory Visit: Payer: Self-pay | Admitting: Family Medicine

## 2021-05-20 ENCOUNTER — Ambulatory Visit: Payer: Medicare Other | Admitting: Cardiology

## 2021-05-22 NOTE — Telephone Encounter (Signed)
Received (2) 100 unit vials of Botox from [facility]. 

## 2021-05-22 NOTE — Telephone Encounter (Signed)
Lm to get Botox appt scheduled. ?

## 2021-05-30 DIAGNOSIS — N183 Chronic kidney disease, stage 3 unspecified: Secondary | ICD-10-CM | POA: Insufficient documentation

## 2021-05-30 DIAGNOSIS — I10 Essential (primary) hypertension: Secondary | ICD-10-CM | POA: Insufficient documentation

## 2021-05-30 DIAGNOSIS — N1832 Chronic kidney disease, stage 3b: Secondary | ICD-10-CM | POA: Insufficient documentation

## 2021-05-30 NOTE — Progress Notes (Signed)
?  ?Cardiology Office Note ? ? ?Date:  05/31/2021  ? ?ID:  [name] [name], DOB 09-29-1955, MRN [id number] ? ?PCP:  Abner Greenspan, MD  ?Cardiologist:   Minus Breeding, MD ? ? ?Chief Complaint  ?Patient presents with  ? Palpitations  ? ? ? ?  ?History of Present Illness: ?[name] [name] is a 66 y.o. female who presents for follow up of palpitations..  She also has a history of coronary disease.  In 2017 she had a Lexiscan Myoview prior to shoulder surgery.   She had another stress test in 2020 that was low risk for any ischemia.  ? ?The patient returns for follow-up.  Since I last saw her she has had fatigue but no acute cardiovascular problems.  When she ran out of her metoprolol she noticed some increased palpitations.  However, if she takes her propranolol and the metoprolol at the same time she does not have significant palpitations.  She does not have any presyncope or syncope.  She has had no further chest discomfort, neck or arm discomfort ? ? ?Past Medical History:  ?Diagnosis Date  ? Anxiety   ? Breast cancer ([place])   ? ER+/PR+/Her2-  ? Concussion 05/26/14  ? Depression   ? Frequent UTI   ? GERD (gastroesophageal reflux disease)   ? Hypothyroid   ? Microscopic hematuria   ? Migraine   ? Personal history of radiation therapy   ? Radiation 03/22/14-04/19/14  ? Breast Cancer upper-outer  ? Restless leg syndrome   ? Seasonal allergies   ? Stented coronary artery   ? LAD 95% 2007.  ? Vitamin D deficiency   ? ? ?Past Surgical History:  ?Procedure Laterality Date  ? ABDOMINAL HYSTERECTOMY    ? APPENDECTOMY    ? back surgery  1997  ? herniated disc repair  ? BREAST LUMPECTOMY Right   ? 2016  ? CARDIAC CATHETERIZATION  2007  ? stent  ? CHOLECYSTECTOMY OPEN    ? CYSTOSCOPY    ? neg  ? RADIOACTIVE SEED GUIDED PARTIAL MASTECTOMY WITH AXILLARY SENTINEL LYMPH NODE BIOPSY Right 02/14/2014  ? Procedure: RADIOACTIVE SEED GUIDED PARTIAL MASTECTOMY WITH AXILLARY SENTINEL LYMPH NODE BIOPSY;  Surgeon: Erroll Luna, MD;   Location: [facility];  Service: General;  Laterality: Right;  ? SHOULDER SURGERY Right   ? ? ? ?Current Outpatient Medications  ?Medication Sig Dispense Refill  ? aspirin 81 MG tablet Take 81 mg by mouth daily.    ? botulinum toxin Type A (BOTOX) 100 units SOLR injection Inject 155 units every 3 month, discard remaining. 2 each 3  ? CINNAMON PO Take 1,000 mg by mouth daily.     ? Coenzyme Q10 (CO Q-10) 200 MG CAPS Take 1 capsule by mouth daily.      ? famotidine (PEPCID) 20 MG tablet TAKE 2 TABLETS BY MOUTH TWICE  DAILY 360 tablet 0  ? fish oil-omega-3 fatty acids 1000 MG capsule Take 1.2 g by mouth 2 (two) times daily.    ? furosemide (LASIX) 20 MG tablet TAKE 1 TABLET BY MOUTH DAILY 90 tablet 0  ? Ginkgo Biloba 120 MG CAPS Take 1 capsule by mouth daily.      ? KEFLEX 250 MG capsule     ? levothyroxine (SYNTHROID) 100 MCG tablet Take 100 mcg by mouth daily before breakfast.    ? LIOTHYRONINE SODIUM PO 5 mg.     ? Magnesium 250 MG TABS Take 250 mg by mouth daily.    ?  metoprolol tartrate (LOPRESSOR) 25 MG tablet Take 25 mg daily 90 tablet 3  ? Multiple Vitamin (MULTIVITAMIN) capsule Take 1 capsule by mouth daily.    ? nitroGLYCERIN (NITROSTAT) 0.4 MG SL tablet DISSOLVE ONE TABLET UNDER THE TONGUE EVERY 5 MINUTES AS NEEDED FOR CHEST PAIN.  DO NOT EXCEED A TOTAL OF 3 DOSES IN 15 MINUTES 25 tablet 6  ? ondansetron (ZOFRAN) 4 MG tablet Take 1 tablet (4 mg total) by mouth every 8 (eight) hours as needed for nausea or vomiting. 20 tablet 3  ? pantoprazole (PROTONIX) 40 MG tablet TAKE 1 TABLET BY MOUTH  TWICE DAILY 180 tablet 3  ? potassium chloride SA (KLOR-CON M) 20 MEQ tablet TAKE 2 TABLETS BY MOUTH TWICE  DAILY AND ONE-HALF TABLET BY  MOUTH DAILY AT BEDTIME 405 tablet 0  ? propranolol ER (INDERAL LA) 60 MG 24 hr capsule Take 1 capsule by mouth once daily at bedtime 90 capsule 3  ? Riboflavin 400 MG TABS Take 400 mg by mouth daily.    ? Rimegepant Sulfate (NURTEC) 75 MG TBDP DISSOLVE 1 TABLET ON THE  TONGUE  EVERY OTHER DAY 45 tablet 1  ? simvastatin (ZOCOR) 40 MG tablet TAKE 1 TABLET BY MOUTH AT  BEDTIME 90 tablet 3  ? topiramate (TOPAMAX) 100 MG tablet Take 1 tablet (100 mg total) by mouth 2 (two) times daily. 180 tablet 3  ? Vibegron (GEMTESA PO) Take 1 tablet by mouth daily.    ? ?No current facility-administered medications for this visit.  ? ? ?Allergies:   Iohexol  ? ?ROS:  Please see the history of present illness.   Otherwise, review of systems are positive for none.   All other systems are reviewed and negative.  ? ? ?PHYSICAL EXAM: ?VS:  BP 132/70 (BP Location: Left Arm, Patient Position: Sitting, Cuff Size: Normal)   Pulse 64   Ht 5' 6.5" (1.689 m)   Wt 211 lb (95.7 kg)   BMI 33.55 kg/m?  , BMI Body mass index is 33.55 kg/m?. ?GENERAL:  Well appearing ?NECK:  No jugular venous distention, waveform within normal limits, carotid upstroke brisk and symmetric, no bruits, no thyromegaly ?LUNGS:  Clear to auscultation bilaterally ?CHEST:  Unremarkable ?HEART:  PMI not displaced or sustained,S1 and S2 within normal limits, no S3, no S4, no clicks, no rubs, no murmurs ?ABD:  Flat, positive bowel sounds normal in frequency in pitch, no bruits, no rebound, no guarding, no midline pulsatile mass, no hepatomegaly, no splenomegaly ?EXT:  2 plus pulses throughout, no edema, no cyanosis no clubbing ? ?EKG:  EKG is not ordered today. ? ? ? ?Recent Labs: ?07/18/2020: TSH 0.74 ?04/04/2021: ALT 32; BUN 22; Creatinine 1.22; Hemoglobin 13.5; Platelet Count 175; Potassium 4.1; Sodium 141  ? ? ?Lipid Panel ?   ?Component Value Date/Time  ? CHOL 125 07/18/2020 1547  ? CHOL 140 04/06/2020 1440  ? TRIG 274.0 (H) 07/18/2020 1547  ? HDL 41.20 07/18/2020 1547  ? HDL 46 04/06/2020 1440  ? CHOLHDL 3 07/18/2020 1547  ? VLDL 54.8 (H) 07/18/2020 1547  ? LDLCALC 68 04/06/2020 1440  ? [place] 74 06/26/2017 1531  ? LDLDIRECT 62.0 07/18/2020 1547  ? ?  ? ?Wt Readings from Last 3 Encounters:  ?05/31/21 211 lb (95.7 kg)  ?04/24/21 206 lb  8 oz (93.7 kg)  ?04/04/21 204 lb 3.2 oz (92.6 kg)  ?  ? ? ?Other studies Reviewed: ?Additional studies/ records that were reviewed today include: Labs. ?Review of the above  records demonstrates:  Please see elsewhere in the note.   ? ? ?ASSESSMENT AND PLAN: ? ?  ?CHEST PAIN/CAD - ?She has had no further chest discomfort.  She had negative stress test in 2020.  No further testing. ?  ?DYSLIPIDEMIA -  ?LDL was 62 with an HDL of 41.  She needs a fasting lipid profile.   ? ?PALPITATIONS: ?These are well controlled on this dose of the 2 different beta-blockers and I will continue this.  ? ?HTN:  ?Her blood pressure is well controlled.  No change in therapy.  ? ?CKD IIIA: ?Creatinine was 1.22 which is better than previous.  Her dose of Lasix and potassium were decreased.  She will continue with the meds as listed. ? ? ?Current medicines are reviewed at length with the patient today.  The patient does not have concerns regarding medicines. ? ?The following changes have been made:  no change ? ?Labs/ tests ordered today include:  ? ?Orders Placed This Encounter  ?Procedures  ? Lipid panel  ? ? ?Disposition:   FU with me in one year.   ? ? ?Signed, ?Minus Breeding, MD  ?05/31/2021 4:25 PM    ?[place] ? ? ? ?

## 2021-05-31 ENCOUNTER — Ambulatory Visit (INDEPENDENT_AMBULATORY_CARE_PROVIDER_SITE_OTHER): Payer: Medicare Other | Admitting: Cardiology

## 2021-05-31 ENCOUNTER — Encounter: Payer: Self-pay | Admitting: Cardiology

## 2021-05-31 VITALS — BP 132/70 | HR 64 | Ht 66.5 in | Wt 211.0 lb

## 2021-05-31 DIAGNOSIS — R072 Precordial pain: Secondary | ICD-10-CM

## 2021-05-31 DIAGNOSIS — E785 Hyperlipidemia, unspecified: Secondary | ICD-10-CM

## 2021-05-31 DIAGNOSIS — R002 Palpitations: Secondary | ICD-10-CM | POA: Diagnosis not present

## 2021-05-31 DIAGNOSIS — I10 Essential (primary) hypertension: Secondary | ICD-10-CM | POA: Diagnosis not present

## 2021-05-31 DIAGNOSIS — N183 Chronic kidney disease, stage 3 unspecified: Secondary | ICD-10-CM | POA: Diagnosis not present

## 2021-05-31 MED ORDER — METOPROLOL TARTRATE 25 MG PO TABS: ORAL_TABLET | ORAL | 3 refills | Status: DC

## 2021-05-31 NOTE — Patient Instructions (Signed)
Medication Instructions:  ?Continue same medications ?*If you need a refill on your cardiac medications before your next appointment, please call your pharmacy* ? ? ?Lab Work: ?None ordered ? ? ?Testing/Procedures: ?None ordered ? ? ?Follow-Up: ?At [facility], you and your health needs are our priority.  As part of our continuing mission to provide you with exceptional heart care, we have created designated Provider Care Teams.  These Care Teams include your primary Cardiologist (physician) and Advanced Practice Providers (APPs -  Physician Assistants and Nurse Practitioners) who all work together to provide you with the care you need, when you need it. ? ?We recommend signing up for the patient portal called "MyChart".  Sign up information is provided on this After Visit Summary.  MyChart is used to connect with patients for Virtual Visits (Telemedicine).  Patients are able to view lab/test results, encounter notes, upcoming appointments, etc.  Non-urgent messages can be sent to your provider as well.   ?To learn more about what you can do with MyChart, go to [email or web address].   ? ?Your next appointment:  1 year ?  ? ?The format for your next appointment: Office ? ? ?Provider:  Dr.Hochrein ? ? ?Important Information About Sugar ? ? ? ? ? ? ?

## 2021-06-06 DIAGNOSIS — I10 Essential (primary) hypertension: Secondary | ICD-10-CM | POA: Diagnosis not present

## 2021-06-06 DIAGNOSIS — R072 Precordial pain: Secondary | ICD-10-CM | POA: Diagnosis not present

## 2021-06-06 DIAGNOSIS — N183 Chronic kidney disease, stage 3 unspecified: Secondary | ICD-10-CM | POA: Diagnosis not present

## 2021-06-06 DIAGNOSIS — E785 Hyperlipidemia, unspecified: Secondary | ICD-10-CM | POA: Diagnosis not present

## 2021-06-06 DIAGNOSIS — R002 Palpitations: Secondary | ICD-10-CM | POA: Diagnosis not present

## 2021-06-06 LAB — LIPID PANEL
Chol/HDL Ratio: 3.2 ratio (ref 0.0–4.4)
Cholesterol, Total: 143 mg/dL (ref 100–199)
HDL: 45 mg/dL (ref >39)
LDL Chol Calc (NIH): 56 mg/dL (ref 0–99)
Triglycerides: 270 mg/dL — ABNORMAL HIGH (ref 0–149)
VLDL Cholesterol Cal: 42 mg/dL — ABNORMAL HIGH (ref 5–40)

## 2021-06-11 DIAGNOSIS — N3020 Other chronic cystitis without hematuria: Secondary | ICD-10-CM | POA: Diagnosis not present

## 2021-06-11 DIAGNOSIS — N3281 Overactive bladder: Secondary | ICD-10-CM | POA: Diagnosis not present

## 2021-07-31 ENCOUNTER — Other Ambulatory Visit: Payer: Self-pay | Admitting: Neurology

## 2021-07-31 DIAGNOSIS — G43719 Chronic migraine without aura, intractable, without status migrainosus: Secondary | ICD-10-CM

## 2021-07-31 DIAGNOSIS — G43009 Migraine without aura, not intractable, without status migrainosus: Secondary | ICD-10-CM

## 2021-08-03 ENCOUNTER — Other Ambulatory Visit: Payer: Self-pay | Admitting: Family Medicine

## 2021-08-05 NOTE — Telephone Encounter (Signed)
Please call patient and schedule medicare wellness visit.. Patient needs her physical scheduled with Dr. Glori Bickers also.

## 2021-08-07 NOTE — Telephone Encounter (Signed)
LVM for patient to call and schedule appointment.

## 2021-08-13 ENCOUNTER — Telehealth: Payer: Self-pay | Admitting: Family Medicine

## 2021-08-13 NOTE — Telephone Encounter (Signed)
Left message for patient to call back and schedule Medicare Annual Wellness Visit (AWV) either virtually or phone   Last AWV ;08/15/20   I left my direct # [phone]

## 2021-09-05 ENCOUNTER — Telehealth: Payer: Self-pay | Admitting: Neurology

## 2021-09-05 NOTE — Telephone Encounter (Signed)
[facility]) calling to verify delivery of Botox. Would like a call back Reference no: [id number]

## 2021-09-05 NOTE — Telephone Encounter (Signed)
I spoke to the pharmacy and it looks like the patient never called [place] back in April to set up her first botox appointment, so we still have her botox in the fridge from then. The pharmacy knows this and they will not be shipping more. I called the patient and left a voice mail telling her to give me a call if she wants to set up a botox appointment.

## 2021-09-16 ENCOUNTER — Encounter: Payer: Self-pay | Admitting: Family Medicine

## 2021-09-24 ENCOUNTER — Other Ambulatory Visit: Payer: Self-pay | Admitting: Cardiology

## 2021-10-03 NOTE — Telephone Encounter (Signed)
LVM and sent mychart msg asking pt to call back to schedule botox appointment

## 2021-10-03 NOTE — Telephone Encounter (Signed)
She left me a voice mail yesterday for a call back to schedule botox please with sarah

## 2021-10-07 DIAGNOSIS — H903 Sensorineural hearing loss, bilateral: Secondary | ICD-10-CM | POA: Diagnosis not present

## 2021-10-07 DIAGNOSIS — H9202 Otalgia, left ear: Secondary | ICD-10-CM | POA: Diagnosis not present

## 2021-10-24 ENCOUNTER — Ambulatory Visit (INDEPENDENT_AMBULATORY_CARE_PROVIDER_SITE_OTHER): Payer: Medicare Other | Admitting: Neurology

## 2021-10-24 DIAGNOSIS — G43719 Chronic migraine without aura, intractable, without status migrainosus: Secondary | ICD-10-CM | POA: Diagnosis not present

## 2021-10-24 MED ORDER — ONABOTULINUMTOXINA 100 UNITS IJ SOLR
155.0000 [IU] | Freq: Once | INTRAMUSCULAR | Status: AC
  Administered 2021-10-24: 155 [IU] via INTRAMUSCULAR

## 2021-10-24 NOTE — Progress Notes (Signed)
Botox- 100 units x 2 vials Lot: [id number] Expiration: 09/2023 NDC: 9470-9628-36  Bacteriostatic 0.9% Sodium Chloride- 43m total Lot: [id number] Expiration: 09/28/2022 NDC: [address]-65 Dx: [id number]/P

## 2021-10-24 NOTE — Progress Notes (Signed)
    BOTOX PROCEDURE NOTE FOR MIGRAINE HEADACHE  HISTORY: [name] [name] is here today for botox, this is her first injection. Continues with daily headache, 1 a week is severe. Still taking Topamax, uses Nurtec for rescue.   Description of procedure:  The patient was placed in a sitting position. The standard protocol was used for Botox as follows, with 5 units of Botox injected at each site:   -Procerus muscle, midline injection  -Corrugator muscle, bilateral injection  -Frontalis muscle, bilateral injection, with 2 sites each side, medial injection was performed in the upper one third of the frontalis muscle, in the region vertical from the medial inferior edge of the superior orbital rim. The lateral injection was again in the upper one third of the forehead vertically above the lateral limbus of the cornea, 1.5 cm lateral to the medial injection site.  -Temporalis muscle injection, 4 sites, bilaterally. The first injection was 3 cm above the tragus of the ear, second injection site was 1.5 cm to 3 cm up from the first injection site in line with the tragus of the ear. The third injection site was 1.5-3 cm forward between the first 2 injection sites. The fourth injection site was 1.5 cm posterior to the second injection site.  -Occipitalis muscle injection, 3 sites, bilaterally. The first injection was done one half way between the occipital protuberance and the tip of the mastoid process behind the ear. The second injection site was done lateral and superior to the first, 1 fingerbreadth from the first injection. The third injection site was 1 fingerbreadth superiorly and medially from the first injection site.  -Cervical paraspinal muscle injection, 2 sites, bilateral, the first injection site was 1 cm from the midline of the cervical spine, 3 cm inferior to the lower border of the occipital protuberance. The second injection site was 1.5 cm superiorly and laterally to the first  injection site.  -Trapezius muscle injection was performed at 3 sites, bilaterally. The first injection site was in the upper trapezius muscle halfway between the inflection point of the neck, and the acromion. The second injection site was one half way between the acromion and the first injection site. The third injection was done between the first injection site and the inflection point of the neck.   A 200 unit bottle of Botox was used, 155 units were injected, the rest of the Botox was wasted. The patient tolerated the procedure well, there were no complications of the above procedure.  Botox NDC 9024-0973-53 Lot number [id number] Expiration date 09/2023 SP

## 2021-10-27 ENCOUNTER — Other Ambulatory Visit: Payer: Self-pay | Admitting: Family Medicine

## 2021-10-31 ENCOUNTER — Ambulatory Visit: Payer: Medicare Other | Admitting: Neurology

## 2021-11-13 ENCOUNTER — Other Ambulatory Visit: Payer: Self-pay | Admitting: Neurology

## 2021-11-13 ENCOUNTER — Other Ambulatory Visit: Payer: Self-pay | Admitting: Cardiology

## 2021-11-13 ENCOUNTER — Other Ambulatory Visit: Payer: Self-pay | Admitting: Family Medicine

## 2021-11-13 DIAGNOSIS — G43719 Chronic migraine without aura, intractable, without status migrainosus: Secondary | ICD-10-CM

## 2021-11-13 DIAGNOSIS — G43009 Migraine without aura, not intractable, without status migrainosus: Secondary | ICD-10-CM

## 2021-11-14 ENCOUNTER — Telehealth: Payer: Self-pay | Admitting: Family Medicine

## 2021-11-14 NOTE — Telephone Encounter (Signed)
Left message for patient to call back and schedule Medicare Annual Wellness Visit (AWV) either virtually or phone  Left  my jabber number [phone]   Last AWV  08/15/20    45 min for awv-i and in office appointments 30 min for awv-s  phone/virtual appointments

## 2021-11-18 NOTE — Telephone Encounter (Signed)
Rx sent 

## 2021-12-05 ENCOUNTER — Encounter: Payer: Self-pay | Admitting: Nurse Practitioner

## 2021-12-12 DIAGNOSIS — R8271 Bacteriuria: Secondary | ICD-10-CM | POA: Diagnosis not present

## 2021-12-12 DIAGNOSIS — N3020 Other chronic cystitis without hematuria: Secondary | ICD-10-CM | POA: Diagnosis not present

## 2021-12-12 DIAGNOSIS — R300 Dysuria: Secondary | ICD-10-CM | POA: Diagnosis not present

## 2021-12-23 ENCOUNTER — Telehealth: Payer: Self-pay | Admitting: Family Medicine

## 2021-12-23 NOTE — Telephone Encounter (Signed)
LVM for pt to rtn my call to schedule AWV, Labs and CPE call back # [phone]

## 2022-01-02 ENCOUNTER — Ambulatory Visit
Admission: RE | Admit: 2022-01-02 | Discharge: 2022-01-02 | Disposition: A | Discharge status: Home / Self Care | Payer: Medicare Other | Source: Ambulatory Visit | Attending: Nurse Practitioner | Admitting: Nurse Practitioner

## 2022-01-02 DIAGNOSIS — Z1231 Encounter for screening mammogram for malignant neoplasm of breast: Secondary | ICD-10-CM | POA: Diagnosis not present

## 2022-01-09 ENCOUNTER — Other Ambulatory Visit (HOSPITAL_COMMUNITY): Payer: Self-pay

## 2022-01-09 ENCOUNTER — Telehealth: Payer: Self-pay | Admitting: Neurology

## 2022-01-09 NOTE — Telephone Encounter (Signed)
Patient Advocate Encounter   Received notification that prior authorization for Botox 200UNIT solution is required.   PA submitted on 01/09/2022 Key [id number] Status is pending       Lyndel Safe, [place] Patient Advocate Specialist [facility] Patient Advocate Team Direct Number: [phone]  Fax: [phone]

## 2022-01-09 NOTE — Telephone Encounter (Signed)
I called [facility] to set up botox delivery and they said that her [place] PA expired on 12/03/21. Can we please submit a new one? Thanks

## 2022-01-10 ENCOUNTER — Other Ambulatory Visit (HOSPITAL_COMMUNITY): Payer: Self-pay

## 2022-01-10 NOTE — Telephone Encounter (Signed)
Patient Advocate Encounter  Prior Authorization for Botox 200UNIT solution has been approved.    PA# [id number] Effective dates: 01/09/2022 through 04/10/2022  Can be filled at [place], [place] Patient [place] Patient Advocate Team Direct Number: [phone]  Fax: [phone]

## 2022-01-13 ENCOUNTER — Other Ambulatory Visit (HOSPITAL_COMMUNITY): Payer: Self-pay

## 2022-01-13 ENCOUNTER — Other Ambulatory Visit: Payer: Self-pay

## 2022-01-13 MED ORDER — BOTOX 100 UNITS IJ SOLR
INTRAMUSCULAR | 3 refills | Status: AC
  Filled 2022-01-13 (×2): qty 2, 90d supply, fill #0
  Filled 2022-01-13: qty 2, fill #0

## 2022-01-13 NOTE — Telephone Encounter (Signed)
Rx sent to [facility]. 

## 2022-01-13 NOTE — Addendum Note (Signed)
Addended by: Cristela Felt E on: 01/13/2022 08:22 AM   Modules accepted: Orders

## 2022-01-15 ENCOUNTER — Other Ambulatory Visit: Payer: Self-pay

## 2022-01-16 ENCOUNTER — Other Ambulatory Visit: Payer: Self-pay | Admitting: Neurology

## 2022-01-16 ENCOUNTER — Ambulatory Visit (INDEPENDENT_AMBULATORY_CARE_PROVIDER_SITE_OTHER): Payer: Medicare Other | Admitting: Neurology

## 2022-01-16 ENCOUNTER — Other Ambulatory Visit: Payer: Self-pay | Admitting: Family Medicine

## 2022-01-16 VITALS — BP 133/82 | HR 60 | Ht 68.0 in | Wt 202.0 lb

## 2022-01-16 DIAGNOSIS — G43719 Chronic migraine without aura, intractable, without status migrainosus: Secondary | ICD-10-CM

## 2022-01-16 DIAGNOSIS — G43009 Migraine without aura, not intractable, without status migrainosus: Secondary | ICD-10-CM

## 2022-01-16 MED ORDER — ONABOTULINUMTOXINA 100 UNITS IJ SOLR
155.0000 [IU] | Freq: Once | INTRAMUSCULAR | Status: AC
  Administered 2022-01-16: 155 [IU] via INTRAMUSCULAR

## 2022-01-16 NOTE — Telephone Encounter (Signed)
Multiple phone calls have been made to try and get pt scheduled for an appt. Med declined until pt scheduled CPE or at least a f/u

## 2022-01-16 NOTE — Progress Notes (Signed)
     BOTOX PROCEDURE NOTE FOR MIGRAINE HEADACHE   HISTORY: Here today for Botox for chronic migraine headaches, this is her second injection.  She had good benefit with the first injection.  Has noticed decrease in the frequency and severity of headaches.  On average 2-3 a week, 1 is significant.  Uses Nurtec with excellent benefit.   Description of procedure:  The patient was placed in a sitting position. The standard protocol was used for Botox as follows, with 5 units of Botox injected at each site:   -Procerus muscle, midline injection  -Corrugator muscle, bilateral injection  -Frontalis muscle, bilateral injection, with 2 sites each side, medial injection was performed in the upper one third of the frontalis muscle, in the region vertical from the medial inferior edge of the superior orbital rim. The lateral injection was again in the upper one third of the forehead vertically above the lateral limbus of the cornea, 1.5 cm lateral to the medial injection site.  -Temporalis muscle injection, 4 sites, bilaterally. The first injection was 3 cm above the tragus of the ear, second injection site was 1.5 cm to 3 cm up from the first injection site in line with the tragus of the ear. The third injection site was 1.5-3 cm forward between the first 2 injection sites. The fourth injection site was 1.5 cm posterior to the second injection site.  -Occipitalis muscle injection, 3 sites, bilaterally. The first injection was done one half way between the occipital protuberance and the tip of the mastoid process behind the ear. The second injection site was done lateral and superior to the first, 1 fingerbreadth from the first injection. The third injection site was 1 fingerbreadth superiorly and medially from the first injection site.  -Cervical paraspinal muscle injection, 2 sites, bilateral, the first injection site was 1 cm from the midline of the cervical spine, 3 cm inferior to the lower border of  the occipital protuberance. The second injection site was 1.5 cm superiorly and laterally to the first injection site.  -Trapezius muscle injection was performed at 3 sites, bilaterally. The first injection site was in the upper trapezius muscle halfway between the inflection point of the neck, and the acromion. The second injection site was one half way between the acromion and the first injection site. The third injection was done between the first injection site and the inflection point of the neck.   A 200 unit bottle of Botox was used, 155 units were injected, the rest of the Botox was wasted. The patient tolerated the procedure well, there were no complications of the above procedure.  Botox NDC 2023-3435-68 Lot number [id number] Expiration date 04/27/2024 SP

## 2022-01-16 NOTE — Progress Notes (Signed)
Botox- 100 units x 2 vials Lot: [id number]  Expiration: 04/27/2024 NDC: 2929-0903-01  Bacteriostatic 0.9% Sodium Chloride- 25m total Lot: [id number] Expiration: 09/28/2022 NDC: [address]-14 Dx: [id number]/P

## 2022-01-30 ENCOUNTER — Other Ambulatory Visit: Payer: Self-pay | Admitting: Family Medicine

## 2022-02-12 ENCOUNTER — Other Ambulatory Visit: Payer: Self-pay | Admitting: Neurology

## 2022-02-12 ENCOUNTER — Other Ambulatory Visit: Payer: Self-pay | Admitting: Family Medicine

## 2022-02-24 ENCOUNTER — Other Ambulatory Visit: Payer: Self-pay | Admitting: Family Medicine

## 2022-02-26 ENCOUNTER — Other Ambulatory Visit: Payer: Self-pay | Admitting: Family Medicine

## 2022-02-27 NOTE — Telephone Encounter (Signed)
Several attempts have been made to get appt scheduled with pt. Sent pt a mychart message letting her know she needs to schedule an appt before we can refill any meds., will hold meds for a few days to see it pt makes appt.

## 2022-02-28 ENCOUNTER — Telehealth: Payer: Self-pay | Admitting: Family Medicine

## 2022-02-28 MED ORDER — FAMOTIDINE 20 MG PO TABS: 40.0000 mg | ORAL_TABLET | Freq: Two times a day (BID) | ORAL | 0 refills | Status: DC

## 2022-02-28 MED ORDER — FUROSEMIDE 20 MG PO TABS: 20.0000 mg | ORAL_TABLET | Freq: Every day | ORAL | 0 refills | Status: DC

## 2022-02-28 NOTE — Telephone Encounter (Signed)
Prescription Request  02/28/2022  Is this a "Controlled Substance" medicine? No  LOV: Visit date not found  What is the name of the medication or equipment? famotidine (PEPCID) 20 MG tablet   furosemide (LASIX) 20 MG tablet   Have you contacted your pharmacy to request a refill? Yes   Which pharmacy would you like this sent to?   [facility], [address] [place] [address] Phone: [phone] Fax: [phone]        Patient notified that their request is being sent to the clinical staff for review and that they should receive a response within 2 business days.   Please advise at [facility] [phone]

## 2022-03-06 ENCOUNTER — Telehealth: Payer: Self-pay | Admitting: Neurology

## 2022-03-06 NOTE — Telephone Encounter (Signed)
Pt is scheduled for botox injection for 04/15/22 and will need a new auth before appointment, current PA on file expires 04/10/22

## 2022-03-17 ENCOUNTER — Telehealth: Payer: Self-pay | Admitting: Family Medicine

## 2022-03-17 DIAGNOSIS — R7309 Other abnormal glucose: Secondary | ICD-10-CM

## 2022-03-17 DIAGNOSIS — N183 Chronic kidney disease, stage 3 unspecified: Secondary | ICD-10-CM

## 2022-03-17 DIAGNOSIS — E039 Hypothyroidism, unspecified: Secondary | ICD-10-CM

## 2022-03-17 DIAGNOSIS — I10 Essential (primary) hypertension: Secondary | ICD-10-CM

## 2022-03-17 DIAGNOSIS — E559 Vitamin D deficiency, unspecified: Secondary | ICD-10-CM

## 2022-03-17 DIAGNOSIS — E7800 Pure hypercholesterolemia, unspecified: Secondary | ICD-10-CM

## 2022-03-17 NOTE — Telephone Encounter (Signed)
-----   Message from Velna Hatchet, RT sent at 03/03/2022 10:55 AM EST ----- Regarding: Tue 2/20 lab Patient is scheduled for cpx, please order future labs.  Thanks, Anda Kraft

## 2022-03-18 ENCOUNTER — Other Ambulatory Visit (INDEPENDENT_AMBULATORY_CARE_PROVIDER_SITE_OTHER): Payer: 59

## 2022-03-18 DIAGNOSIS — E559 Vitamin D deficiency, unspecified: Secondary | ICD-10-CM | POA: Diagnosis not present

## 2022-03-18 DIAGNOSIS — R7309 Other abnormal glucose: Secondary | ICD-10-CM | POA: Diagnosis not present

## 2022-03-18 DIAGNOSIS — I10 Essential (primary) hypertension: Secondary | ICD-10-CM | POA: Diagnosis not present

## 2022-03-18 DIAGNOSIS — E7800 Pure hypercholesterolemia, unspecified: Secondary | ICD-10-CM

## 2022-03-18 DIAGNOSIS — E039 Hypothyroidism, unspecified: Secondary | ICD-10-CM

## 2022-03-19 ENCOUNTER — Other Ambulatory Visit (HOSPITAL_COMMUNITY): Payer: Self-pay

## 2022-03-19 LAB — CBC WITH DIFFERENTIAL/PLATELET
Basophils Absolute: 0.1 10*3/uL (ref 0.0–0.1)
Basophils Relative: 0.9 % (ref 0.0–3.0)
Eosinophils Absolute: 0.1 10*3/uL (ref 0.0–0.7)
Eosinophils Relative: 2.2 % (ref 0.0–5.0)
HCT: 39 % (ref 36.0–46.0)
Hemoglobin: 13.3 g/dL (ref 12.0–15.0)
Lymphocytes Relative: 25.7 % (ref 12.0–46.0)
Lymphs Abs: 1.6 10*3/uL (ref 0.7–4.0)
MCHC: 34.1 g/dL (ref 30.0–36.0)
MCV: 90 fl (ref 78.0–100.0)
Monocytes Absolute: 0.6 10*3/uL (ref 0.1–1.0)
Monocytes Relative: 9.1 % (ref 3.0–12.0)
Neutro Abs: 3.9 10*3/uL (ref 1.4–7.7)
Neutrophils Relative %: 62.1 % (ref 43.0–77.0)
Platelets: 192 10*3/uL (ref 150.0–400.0)
RBC: 4.34 Mil/uL (ref 3.87–5.11)
RDW: 13.6 % (ref 11.5–15.5)
WBC: 6.3 10*3/uL (ref 4.0–10.5)

## 2022-03-19 LAB — LIPID PANEL
Cholesterol: 128 mg/dL (ref 0–200)
HDL: 47.3 mg/dL (ref >39.00)
LDL Cholesterol: 52 mg/dL (ref 0–99)
NonHDL: 80.38
Total CHOL/HDL Ratio: 3
Triglycerides: 142 mg/dL (ref 0.0–149.0)
VLDL: 28.4 mg/dL (ref 0.0–40.0)

## 2022-03-19 LAB — COMPREHENSIVE METABOLIC PANEL
ALT: 27 U/L (ref 0–35)
AST: 27 U/L (ref 0–37)
Albumin: 4.4 g/dL (ref 3.5–5.2)
Alkaline Phosphatase: 50 U/L (ref 39–117)
BUN: 17 mg/dL (ref 6–23)
CO2: 25 mEq/L (ref 19–32)
Calcium: 10.2 mg/dL (ref 8.4–10.5)
Chloride: 107 mEq/L (ref 96–112)
Creatinine, Ser: 1.16 mg/dL (ref 0.40–1.20)
GFR: 48.73 mL/min — ABNORMAL LOW (ref >60.00)
Glucose, Bld: 157 mg/dL — ABNORMAL HIGH (ref 70–99)
Potassium: 4.4 mEq/L (ref 3.5–5.1)
Sodium: 142 mEq/L (ref 135–145)
Total Bilirubin: 1.7 mg/dL — ABNORMAL HIGH (ref 0.2–1.2)
Total Protein: 6.8 g/dL (ref 6.0–8.3)

## 2022-03-19 LAB — HEMOGLOBIN A1C: Hgb A1c MFr Bld: 5.7 % (ref 4.6–6.5)

## 2022-03-19 LAB — TSH: TSH: 0.65 u[IU]/mL (ref 0.35–5.50)

## 2022-03-19 LAB — VITAMIN D 25 HYDROXY (VIT D DEFICIENCY, FRACTURES): VITD: 33.91 ng/mL (ref 30.00–100.00)

## 2022-03-20 ENCOUNTER — Other Ambulatory Visit (HOSPITAL_COMMUNITY): Payer: Self-pay

## 2022-03-20 NOTE — Telephone Encounter (Signed)
Pharmacy Patient Advocate Encounter   Received notification from [facility] that prior authorization for Botox 200 units is required/requested.    PA submitted on 03/20/2022 to (ins) [employer] via CoverMyMeds Key [id number] Status is pending

## 2022-03-20 NOTE — Telephone Encounter (Signed)
Benefit Verification [id number] Submitted! Botox One

## 2022-03-24 ENCOUNTER — Other Ambulatory Visit (HOSPITAL_COMMUNITY): Payer: Self-pay

## 2022-03-24 ENCOUNTER — Other Ambulatory Visit: Payer: Self-pay

## 2022-03-24 MED ORDER — ONABOTULINUMTOXINA 200 UNITS IJ SOLR
200.0000 [IU] | INTRAMUSCULAR | 2 refills | Status: DC
  Filled 2022-03-24: qty 1, 90d supply, fill #0
  Filled 2022-04-03: qty 1, 84d supply, fill #0

## 2022-03-24 NOTE — Telephone Encounter (Signed)
Pharmacy Patient Advocate Encounter- Botox BIV-Pharmacy Benefit:  PA was submitted to [employer] and has been approved through: 06/18/2022 Authorization# PA Case ID #: [id number]  Please send prescription to Specialty Pharmacy: [facility]: [phone]  Estimated Copay is: Zero  Patient Is Not eligible for Botox Copay Card, which will make patient's copay as little as zero. Copay card will be provided to pharmacy.

## 2022-03-24 NOTE — Addendum Note (Signed)
Addended by: Kristen Loader on: 03/24/2022 08:30 AM   Modules accepted: Orders

## 2022-03-25 ENCOUNTER — Ambulatory Visit (INDEPENDENT_AMBULATORY_CARE_PROVIDER_SITE_OTHER): Payer: 59 | Admitting: Family Medicine

## 2022-03-25 ENCOUNTER — Encounter: Payer: Self-pay | Admitting: Family Medicine

## 2022-03-25 VITALS — BP 128/72 | HR 59 | Temp 97.5°F | Ht 66.25 in | Wt 198.4 lb

## 2022-03-25 DIAGNOSIS — Z6831 Body mass index (BMI) 31.0-31.9, adult: Secondary | ICD-10-CM

## 2022-03-25 DIAGNOSIS — I10 Essential (primary) hypertension: Secondary | ICD-10-CM | POA: Diagnosis not present

## 2022-03-25 DIAGNOSIS — E6609 Other obesity due to excess calories: Secondary | ICD-10-CM

## 2022-03-25 DIAGNOSIS — R7309 Other abnormal glucose: Secondary | ICD-10-CM | POA: Diagnosis not present

## 2022-03-25 DIAGNOSIS — E039 Hypothyroidism, unspecified: Secondary | ICD-10-CM | POA: Diagnosis not present

## 2022-03-25 DIAGNOSIS — E559 Vitamin D deficiency, unspecified: Secondary | ICD-10-CM | POA: Diagnosis not present

## 2022-03-25 DIAGNOSIS — M85851 Other specified disorders of bone density and structure, right thigh: Secondary | ICD-10-CM

## 2022-03-25 DIAGNOSIS — H9193 Unspecified hearing loss, bilateral: Secondary | ICD-10-CM

## 2022-03-25 DIAGNOSIS — N183 Chronic kidney disease, stage 3 unspecified: Secondary | ICD-10-CM | POA: Diagnosis not present

## 2022-03-25 DIAGNOSIS — E7800 Pure hypercholesterolemia, unspecified: Secondary | ICD-10-CM | POA: Diagnosis not present

## 2022-03-25 DIAGNOSIS — E2839 Other primary ovarian failure: Secondary | ICD-10-CM

## 2022-03-25 DIAGNOSIS — Z0000 Encounter for general adult medical examination without abnormal findings: Secondary | ICD-10-CM

## 2022-03-25 DIAGNOSIS — K219 Gastro-esophageal reflux disease without esophagitis: Secondary | ICD-10-CM

## 2022-03-25 DIAGNOSIS — N1832 Chronic kidney disease, stage 3b: Secondary | ICD-10-CM

## 2022-03-25 NOTE — Assessment & Plan Note (Signed)
Disc goals for lipids and reasons to control them Rev last labs with pt Rev low sat fat diet in detail Some improvement of trig Continues simvastatin 40 mg daily and better diet

## 2022-03-25 NOTE — Assessment & Plan Note (Signed)
Lab Results  Component Value Date   TSH 0.65 03/18/2022   No clinical changes Under care of endocrinology  Taking both levothy and liothyronine currently

## 2022-03-25 NOTE — Assessment & Plan Note (Signed)
Lab Results  Component Value Date   HGBA1C 5.7 03/18/2022   disc imp of low glycemic diet and wt loss to prevent DM2  This is stable Watching diet more carefully  Trig are down also

## 2022-03-25 NOTE — Progress Notes (Signed)
Subjective:    Patient ID: [name] [name], female    DOB: 1955/03/13, 67 y.o.   MRN: [id number]  HPI Here for health maintenance exam and to review chronic medical problems    Wt Readings from Last 3 Encounters:  03/25/22 198 lb 6 oz (90 kg)  01/16/22 202 lb (91.6 kg)  05/31/21 211 lb (95.7 kg)   31.78 kg/m  Doing about the same  Fair overall  Taking care of herself  Weight is down a bit  Making better diet choices  Does not like sweets   Immunization History  Administered Date(s) Administered   Influenza, High Dose Seasonal PF 11/20/2020   Influenza,inj,Quad PF,6+ Mos 11/15/2015, 11/11/2018   Janssen (J&J) SARS-COV-2 Vaccination 05/05/2019   Moderna Sars-Covid-2 Vaccination 01/23/2020   PNEUMOCOCCAL CONJUGATE-20 08/15/2020   Td 01/27/2006   Tdap 11/20/2020   Zoster Recombinat (Shingrix) 11/20/2020, 04/24/2021   Health Maintenance Due  Topic Date Due   Medicare Annual Wellness (AWV)  08/15/2021    Mammogram 12/2021 Personal h/o breast cancer  Self breast exam: no lumps  Has appt in 2 wk for re check of that   Colonoscopy 05/2020  Dexa  03/2019-osteopenia   Falls: none Fractures: none  Supplements  ca and d  D level is 33.9  Exercise : she tries to but hard to keep up  Migraines get in the way - they get worse when she exercises more  Still struggling   Migraine- crp meds did not work Worse in cold conditions /winter time  Summer is less  Is back to botox now    HTN With CAD- sees Dr Percival Spanish  Sees cardiol  bp is stable today  No cp or palpitations or headaches or edema  No side effects to medicines  BP Readings from Last 3 Encounters:  03/25/22 128/72  01/16/22 133/82  05/31/21 132/70   lasix 20 mg daily  Metoprolol 25 mg daily -from cardiology Propranolol ER 60 mg daily at bedtime-from cardiology  Pulse Readings from Last 3 Encounters:  03/25/22 (!) 59  01/16/22 60  05/31/21 64       Lab Results  Component Value Date    CREATININE 1.16 03/18/2022   BUN 17 03/18/2022   NA 142 03/18/2022   K 4.4 03/18/2022   CL 107 03/18/2022   CO2 25 03/18/2022   GFR 48.7   Takes ppi -protonix for GERD and pepcid  Still has heartburn Stomach is cramping   Takes aleve for headaches at times    Worse recently  Had CT scan in 2020- no cause found    Hypothyroid Lab Results  Component Value Date   TSH 0.65 03/18/2022   Sees endocrinology Takes levothy and liothyronine  No changes   Elevated glucose Lab Results  Component Value Date   HGBA1C 5.7 03/18/2022   Stable  Hyperlipidemia Lab Results  Component Value Date   CHOL 128 03/18/2022   CHOL 143 06/06/2021   CHOL 125 07/18/2020   Lab Results  Component Value Date   HDL 47.30 03/18/2022   HDL 45 06/06/2021   HDL 41.20 07/18/2020   Lab Results  Component Value Date   LDLCALC 52 03/18/2022   LDLCALC 56 06/06/2021   LDLCALC 68 04/06/2020   Lab Results  Component Value Date   TRIG 142.0 03/18/2022   TRIG 270 (H) 06/06/2021   TRIG 274.0 (H) 07/18/2020   Lab Results  Component Value Date   CHOLHDL 3 03/18/2022   CHOLHDL 3.2 06/06/2021  CHOLHDL 3 07/18/2020   Lab Results  Component Value Date   LDLDIRECT 62.0 07/18/2020   Simvastatin 40 mg daily  Improvement   Eating more fish  Trying to bring trig down    Patient Active Problem List   Diagnosis Date Noted   Stage 3 chronic kidney disease ([place]) 05/30/2021   Essential hypertension 05/30/2021   Precordial chest pain 11/19/2020   Elevated glucose 07/18/2020   Medicare annual wellness visit, subsequent 11/11/2018   Hearing loss 11/11/2018   Coronary artery disease involving native coronary artery of native heart without angina pectoris 04/21/2017   Positive ANA (antinuclear antibody) 12/15/2016   Fatigue 12/11/2016   Estrogen deficiency 06/24/2016   Osteopenia 05/18/2016   Genetic testing 01/25/2014   Breast cancer of upper-outer quadrant of right female breast ([place])  12/30/2013   Hypothyroid 12/10/2011   Adverse effects of medication 10/28/2010   Routine general medical examination at a health care facility 09/30/2010   PERIODIC LIMB MOVEMENT DISORDER 08/16/2008   HYPERSOMNIA 06/21/2008   Somnolence, daytime 06/07/2008   Vitamin D deficiency 04/07/2008   Chronic UTI 11/05/2007   Renal insufficiency 09/16/2007   HEMATURIA, MICROSCOPIC, HX OF 09/16/2007   Hyperlipidemia 08/05/2007   DEPRESSIVE DISORDER 08/05/2007   Intractable chronic migraine without aura 08/05/2007   Coronary atherosclerosis 08/05/2007   GERD 08/05/2007   DEGENERATIVE [place] DISEASE, LUMBAR SPINE 08/05/2007   EDEMA 08/05/2007   Past Medical History:  Diagnosis Date   Anxiety    Breast cancer ([place])    ER+/PR+/Her2-   Concussion 05/26/14   Depression    Frequent UTI    GERD (gastroesophageal reflux disease)    Hypothyroid    Microscopic hematuria    Migraine    Personal history of radiation therapy    Radiation 03/22/14-04/19/14   Breast Cancer upper-outer   Restless leg syndrome    Seasonal allergies    Stented coronary artery    LAD 95% 2007.   Vitamin D deficiency    Past Surgical History:  Procedure Laterality Date   ABDOMINAL HYSTERECTOMY     APPENDECTOMY     back surgery  1997   herniated disc repair   BREAST LUMPECTOMY Right    2016   CARDIAC CATHETERIZATION  2007   stent   CHOLECYSTECTOMY OPEN     CYSTOSCOPY     neg   RADIOACTIVE SEED GUIDED PARTIAL MASTECTOMY WITH AXILLARY SENTINEL LYMPH NODE BIOPSY Right 02/14/2014   Procedure: RADIOACTIVE SEED GUIDED PARTIAL MASTECTOMY WITH AXILLARY SENTINEL LYMPH NODE BIOPSY;  Surgeon: Erroll Luna, MD;  Location: [facility];  Service: General;  Laterality: Right;   SHOULDER SURGERY Right    Social History   Tobacco Use   Smoking status: Never   Smokeless tobacco: Never  Vaping Use   Vaping Use: Never used  Substance Use Topics   Alcohol use: No    Alcohol/week: 0.0 standard drinks of alcohol     Comment: socially   Drug use: No   Family History  Problem Relation Age of Onset   Coronary artery disease Mother    Thyroid disease Mother    Alzheimer's disease Mother    Diabetes Mother    Heart disease Mother    Melanoma Brother    Ovarian cancer Paternal Aunt 30   Bone cancer Paternal Uncle    Alzheimer's disease Maternal Grandmother    Heart disease Maternal Grandfather    Pancreatic cancer Maternal Grandfather 52   Ovarian cancer Maternal Aunt  dx in 60s   Throat cancer Maternal Uncle        smoker   Lung cancer Maternal Uncle        heavy smoker   Ovarian cancer Paternal Aunt        dx in her 39s   Cancer Cousin        "female cancer"   Melanoma Father    Allergies  Allergen Reactions   Iohexol      Desc: throat selling resp distress hives.premedicate pt.entered 07/06/04, bsw.    Current Outpatient Medications on File Prior to Visit  Medication Sig Dispense Refill   aspirin 81 MG tablet Take 81 mg by mouth daily.     botulinum toxin Type A (BOTOX) 100 units SOLR injection Inject 155 units every 3 month, discard remaining. 2 each 3   botulinum toxin Type A (BOTOX) 200 units injection Inject 200 Units into the muscle every 3 (three) months. Inject into head, neck and shoulders every 3 months 1 each 2   CINNAMON PO Take 1,000 mg by mouth daily.      Coenzyme Q10 (CO Q-10) 200 MG CAPS Take 1 capsule by mouth daily.       famotidine (PEPCID) 20 MG tablet Take 2 tablets (40 mg total) by mouth 2 (two) times daily. 360 tablet 0   fish oil-omega-3 fatty acids 1000 MG capsule Take 1.2 g by mouth 2 (two) times daily.     furosemide (LASIX) 20 MG tablet Take 1 tablet (20 mg total) by mouth daily. 90 tablet 0   Ginkgo Biloba 120 MG CAPS Take 1 capsule by mouth daily.       KEFLEX 250 MG capsule      levothyroxine (SYNTHROID) 100 MCG tablet Take 100 mcg by mouth daily before breakfast.     LIOTHYRONINE SODIUM PO 5 mg.      Magnesium 250 MG TABS Take 250 mg by mouth  daily.     metoprolol tartrate (LOPRESSOR) 25 MG tablet Take 25 mg daily 90 tablet 3   Multiple Vitamin (MULTIVITAMIN) capsule Take 1 capsule by mouth daily.     nitroGLYCERIN (NITROSTAT) 0.4 MG SL tablet DISSOLVE ONE TABLET UNDER THE TONGUE EVERY 5 MINUTES AS NEEDED FOR CHEST PAIN.  DO NOT EXCEED A TOTAL OF 3 DOSES IN 15 MINUTES 25 tablet 6   ondansetron (ZOFRAN) 4 MG tablet TAKE 1 TABLET BY MOUTH EVERY 8 HOURS AS NEEDED FOR NAUSEA FOR VOMITING 20 tablet 2   pantoprazole (PROTONIX) 40 MG tablet TAKE 1 TABLET BY MOUTH TWICE  DAILY 200 tablet 2   potassium chloride SA (KLOR-CON M) 20 MEQ tablet TAKE 2 TABLETS BY MOUTH TWICE  DAILY AND ONE-HALF TABLET BY  MOUTH DAILY AT BEDTIME 405 tablet 0   Probiotic Product (PROBIOTIC-10 PO) Take by mouth.     propranolol ER (INDERAL LA) 60 MG 24 hr capsule TAKE 1 CAPSULE BY MOUTH ONCE  DAILY AT BEDTIME 100 capsule 2   Riboflavin 400 MG TABS Take 400 mg by mouth daily.     Rimegepant Sulfate (NURTEC) 75 MG TBDP DISSOLVE 1 TABLET ON THE TONGUE  EVERY OTHER DAY 16 tablet 1   simvastatin (ZOCOR) 40 MG tablet TAKE 1 TABLET BY MOUTH AT  BEDTIME 100 tablet 2   topiramate (TOPAMAX) 100 MG tablet Take 1 tablet (100 mg total) by mouth 2 (two) times daily. 180 tablet 3   Vibegron (GEMTESA PO) Take 1 tablet by mouth daily.     No current  facility-administered medications on file prior to visit.    Review of Systems  Constitutional:  Negative for activity change, appetite change, fatigue, fever and unexpected weight change.  HENT:  Negative for congestion, ear pain, rhinorrhea, sinus pressure and sore throat.   Eyes:  Negative for pain, redness and visual disturbance.  Respiratory:  Negative for cough, shortness of breath and wheezing.   Cardiovascular:  Negative for chest pain and palpitations.  Gastrointestinal:  Negative for abdominal pain, blood in stool, constipation and diarrhea.  Endocrine: Negative for polydipsia and polyuria.  Genitourinary:  Negative for  dysuria, frequency and urgency.  Musculoskeletal:  Positive for arthralgias. Negative for back pain and myalgias.  Skin:  Negative for pallor and rash.  Allergic/Immunologic: Negative for environmental allergies.  Neurological:  Positive for headaches. Negative for dizziness and syncope.  Hematological:  Negative for adenopathy. Does not bruise/bleed easily.  Psychiatric/Behavioral:  Negative for decreased concentration and dysphoric mood. The patient is not nervous/anxious.        Mood is better        Objective:   Physical Exam Constitutional:      General: She is not in acute distress.    Appearance: Normal appearance. She is well-developed. She is obese. She is not ill-appearing or diaphoretic.  HENT:     Head: Normocephalic and atraumatic.     Right Ear: Tympanic membrane, ear canal and external ear normal.     Left Ear: Tympanic membrane, ear canal and external ear normal.     Nose: Nose normal. No congestion.     Mouth/Throat:     Mouth: Mucous membranes are moist.     Pharynx: Oropharynx is clear. No posterior oropharyngeal erythema.  Eyes:     General: No scleral icterus.    Extraocular Movements: Extraocular movements intact.     Conjunctiva/sclera: Conjunctivae normal.     Pupils: Pupils are equal, round, and reactive to light.  Neck:     Thyroid: No thyromegaly.     Vascular: No carotid bruit or JVD.  Cardiovascular:     Rate and Rhythm: Normal rate and regular rhythm.     Pulses: Normal pulses.     Heart sounds: Normal heart sounds.     No gallop.  Pulmonary:     Effort: Pulmonary effort is normal. No respiratory distress.     Breath sounds: Normal breath sounds. No wheezing.     Comments: Good air exch Chest:     Chest wall: No tenderness.  Abdominal:     General: Bowel sounds are normal. There is no distension or abdominal bruit.     Palpations: Abdomen is soft. There is no mass.     Tenderness: There is no abdominal tenderness.     Hernia: No hernia is  present.  Genitourinary:    Comments: Breast exam is scheduled soon with pt's oncologist  Musculoskeletal:        General: No tenderness. Normal range of motion.     Cervical back: Normal range of motion and neck supple. No rigidity. No muscular tenderness.     Right lower leg: No edema.     Left lower leg: No edema.     Comments: No kyphosis   Lymphadenopathy:     Cervical: No cervical adenopathy.  Skin:    General: Skin is warm and dry.     Coloration: Skin is not pale.     Findings: No erythema or rash.     Comments: Solar lentigines diffusely  Neurological:     Mental Status: She is alert. Mental status is at baseline.     Cranial Nerves: No cranial nerve deficit.     Motor: No abnormal muscle tone.     Coordination: Coordination normal.     Gait: Gait normal.     Deep Tendon Reflexes: Reflexes are normal and symmetric. Reflexes normal.  Psychiatric:        Mood and Affect: Mood normal.        Cognition and Memory: Cognition and memory normal.           Assessment & Plan:   Problem List Items Addressed This Visit       Cardiovascular and Mediastinum   Essential hypertension    bp in fair control at this time  BP Readings from Last 1 Encounters:  03/25/22 128/72  No changes needed Most recent labs reviewed  Disc lifstyle change with low sodium diet and exercise   Continues lasix 20 mg daily  Metoprolol 25 mg daily  Propranolol ER 60 mg qhs  From cardiology  Still struggles with headaches         Digestive   GERD    Pt struggling recently  Taking both protonix 40 mg daily and pepcid 20 mg bid Still has symptoms of heartburn and gastritis   Discussed avoidance of nsaid  Disc trial of diet journal and elim food groups if needed  Rev CT scan from 2020  Rev last EGD with Dr Carlean Purl  If no imp with above and stopping nsaid recommend she f/u with GI        Endocrine   Hypothyroid    Lab Results  Component Value Date   TSH 0.65 03/18/2022  No  clinical changes Under care of endocrinology  Taking both levothy and liothyronine currently        Nervous and Auditory   Hearing loss    Continues to use hearing aides         Musculoskeletal and Integument   Osteopenia    Due for dexa Order done, pt will call to schedule No falls or fx D level is low nl range (inst pt to inc by 1000 iu daily)   No exercise due to severe migraines        Genitourinary   Stage 3b chronic kidney disease ([place])    GFR 48.7 Fairly stable Strongly enc to stop nsaids  Also keep up fluids  Takes proph abx for uti        Other   Vitamin D deficiency (Chronic)    D level in 30s on current supp Inst her to add another 1000 iu daily  Imp for bone and overall health      Elevated glucose    Lab Results  Component Value Date   HGBA1C 5.7 03/18/2022  disc imp of low glycemic diet and wt loss to prevent DM2  This is stable Watching diet more carefully  Trig are down also      Estrogen deficiency   Relevant Orders   DG Bone Density   Hyperlipidemia    Disc goals for lipids and reasons to control them Rev last labs with pt Rev low sat fat diet in detail Some improvement of trig Continues simvastatin 40 mg daily and better diet       RESOLVED: Obesity   Routine general medical examination at a health care facility - Primary    Reviewed health habits including diet and exercise and skin cancer  prevention Reviewed appropriate screening tests for age  Also reviewed health mt list, fam hx and immunization status , as well as social and family history   See HPI Labs reviewed Mammogram utd 12/2021 Dexa ordered / no falls or fx  Colonoscopy utd 03/2019

## 2022-03-25 NOTE — Patient Instructions (Addendum)
Add another 1000 iu of vitamin D3 to what you are already taking, for bone health   Keep a diet journal in terms of stomach symptoms and heartburn  You may want to try dairy free for a few weeks   Nsaids - ibuprofen / aleve are very hard on stomach as well as kidney  Talk to your headache doctor about other headache rescue options to avoid taking the nsaids   Labs are reassuring   Call and schedule the Breast center to schedule your bone density test   Please call the location of your choice from the menu below to schedule your Mammogram and/or Bone Density appointment.    [facility]                      Phone:  [phone], [place] [address]                                                             Services: Traditional and 3D Mammogram, [facility]                 Phone: [phone] [address], [place] [address]    Service: Bone Density ONLY   *this site does NOT perform mammograms  [facility]                        Phone:  [phone] [address], [place] [address]                                            Services:  3D Mammogram and [place] at [facility]   Phone:  [phone]   [place], [place] [address]  Services: 3D Mammogram and Bone Density  [place] at [facility])  Phone:  [phone]   [address] Room [place], [place] [address]                                              Services:  3D Mammogram and Bone  Density

## 2022-03-25 NOTE — Assessment & Plan Note (Signed)
Reviewed health habits including diet and exercise and skin cancer prevention Reviewed appropriate screening tests for age  Also reviewed health mt list, fam hx and immunization status , as well as social and family history   See HPI Labs reviewed Mammogram utd 12/2021 Dexa ordered / no falls or fx  Colonoscopy utd 03/2019

## 2022-03-25 NOTE — Assessment & Plan Note (Signed)
GFR 48.7 Fairly stable Strongly enc to stop nsaids  Also keep up fluids  Takes proph abx for uti

## 2022-03-25 NOTE — Assessment & Plan Note (Signed)
Pt struggling recently  Taking both protonix 40 mg daily and pepcid 20 mg bid Still has symptoms of heartburn and gastritis   Discussed avoidance of nsaid  Disc trial of diet journal and elim food groups if needed  Rev CT scan from 2020  Rev last EGD with Dr Carlean Purl  If no imp with above and stopping nsaid recommend she f/u with GI

## 2022-03-25 NOTE — Assessment & Plan Note (Signed)
Stable GFR of 48.7  Strongly enc to stop nsaids Also inc fluids if able

## 2022-03-25 NOTE — Assessment & Plan Note (Signed)
Due for dexa Order done, pt will call to schedule No falls or fx D level is low nl range (inst pt to inc by 1000 iu daily)   No exercise due to severe migraines

## 2022-03-25 NOTE — Assessment & Plan Note (Signed)
bp in fair control at this time  BP Readings from Last 1 Encounters:  03/25/22 128/72   No changes needed Most recent labs reviewed  Disc lifstyle change with low sodium diet and exercise   Continues lasix 20 mg daily  Metoprolol 25 mg daily  Propranolol ER 60 mg qhs  From cardiology  Still struggles with headaches

## 2022-03-25 NOTE — Assessment & Plan Note (Signed)
Continues to use hearing aides

## 2022-03-25 NOTE — Assessment & Plan Note (Signed)
D level in 30s on current supp Inst her to add another 1000 iu daily  Imp for bone and overall health

## 2022-03-29 ENCOUNTER — Other Ambulatory Visit: Payer: Self-pay | Admitting: Neurology

## 2022-03-31 DIAGNOSIS — G43709 Chronic migraine without aura, not intractable, without status migrainosus: Secondary | ICD-10-CM | POA: Diagnosis not present

## 2022-03-31 DIAGNOSIS — N1831 Chronic kidney disease, stage 3a: Secondary | ICD-10-CM | POA: Diagnosis not present

## 2022-03-31 DIAGNOSIS — E876 Hypokalemia: Secondary | ICD-10-CM | POA: Diagnosis not present

## 2022-03-31 DIAGNOSIS — N390 Urinary tract infection, site not specified: Secondary | ICD-10-CM | POA: Diagnosis not present

## 2022-04-01 ENCOUNTER — Other Ambulatory Visit: Payer: Self-pay

## 2022-04-01 DIAGNOSIS — Z170 Estrogen receptor positive status [ER+]: Secondary | ICD-10-CM

## 2022-04-01 NOTE — Progress Notes (Unsigned)
Patient Care Team: Tower, Wynelle Fanny, MD as PCP - General Minus Breeding, MD as PCP - Cardiology (Cardiology) Minus Breeding, MD as Consulting Physician (Cardiology) Holley Bouche, NP (Inactive) as Nurse Practitioner (Nurse Practitioner) Erroll Luna, MD as Consulting Physician (General Surgery) Thea Silversmith, MD as Consulting Physician (Radiation Oncology) Truitt Merle, MD as Consulting Physician (Hematology) Susa Day, MD as Consulting Physician (Orthopedic Surgery)   CHIEF COMPLAINT: Follow up right breast cancer   Oncology History Overview Note  Cancer Staging Breast cancer of upper-outer quadrant of right female breast [facility]) Staging form: Breast, AJCC 7th Edition - Clinical stage from 01/04/2014: Stage IA (T1b, N0, M0) - Unsigned - Pathologic stage from 02/14/2014: Stage IA (T1b, N0, cM0) - Unsigned     Breast cancer of upper-outer quadrant of right female breast ([place])  12/29/2013 Imaging   Screening mammogram and [place]: an irregular shadowing hypoechoic mass right breast 9 o'clock location 4 cm from the nipple measuring 7 x 7 x 7 mm. No adenopathy    12/29/2013 Initial Diagnosis   Breast cancer of upper-outer quadrant of right female breast   12/29/2013 Initial Biopsy   Grade I-II IDC, ER+ (98%), PR+ (81%), HER2- (ratio 1.24), Ki67 15%    01/11/2014 Procedure   Genetic counseling/testing: Revealed 1 VUS in ATM gene, p.D1641H (c.4921G>C).  Otherwise, genetic testing negative.    02/14/2014 Surgery   Right lumpectomy with SLNB ([place]): Grade 2 IDC spanning 0.9 cm with associated grade 2 DCIS.  Negative margins. 5 sentinel lymph nodes negative.  HER2 repeated and neg. (ratio 1.16).   02/14/2014 Pathologic Stage   pT1bpN0; Stage IA   02/14/2014 Oncotype testing   Recurrence score 21 (13% risk of distant recurrence); no adjuvant chemo offered.    03/22/2014 - 04/19/2014 Radiation Therapy   Adjuvant radiation Pablo Ledger); Right breast: Total dose 42.72 Gy over 21  fractions. Right breast boost: Total dose 10 Gy over 5 fractions.     Anti-estrogen oral therapy   Anti-estrogen therapy was recommended by Dr. Burr Medico; pt declines anti-estrogen treatment.     05/25/2014 Survivorship   Survivorship Care Plan given to patient and reviewed with her in-person.    05/30/2016 Imaging   US Abdomen 05/30/16 IMPRESSION: Status post cholecystectomy. Pancreas not visualized due to overlying bowel gas. Increased echogenicity of hepatic parenchyma is noted diffusely suggesting fatty infiltration or other diffuse hepatocellular disease.   07/02/2016 Imaging   DEXA 07/02/16 T score of -1.8, indicating osteopenia   12/15/2016 Mammogram   IMPRESSION: 1. No mammographic evidence of malignancy in either breast. 2. Stable right breast post lumpectomy changes.      CURRENT THERAPY: Surveillance   INTERVAL HISTORY Ms. Vidovich returns for follow up as scheduled. Last seen by me 04/04/21. Mammogram 01/02/22 was benign. She continues to have occasional left breast pain that lasts a few days then resolves.  Denies new lump/mass, nipple discharge or inversion, or skin change in either breast.  She continues to have left upper quadrant pain beneath the rib cage which has been present now for several years.  She thinks it is related to acid reflux.  Has mentioned it to several doctors.  She also continues to report ongoing intermittent neck and mid back pain also present for at least a couple years.  2 weeks ago she started to notice tingling in the fingertips of the right hand, and dropping things.  Denies numbness, weakness, slurred speech, or any other neuro concerns.  He sees urology for chronic dysuria.  ROS  All other systems reviewed and negative  Past Medical History:  Diagnosis Date   Anxiety    Breast cancer ([place])    ER+/PR+/Her2-   Concussion 05/26/14   Depression    Frequent UTI    GERD (gastroesophageal reflux disease)    Hypothyroid    Microscopic hematuria    Migraine     Personal history of radiation therapy    Radiation 03/22/14-04/19/14   Breast Cancer upper-outer   Restless leg syndrome    Seasonal allergies    Stented coronary artery    LAD 95% 2007.   Vitamin D deficiency      Past Surgical History:  Procedure Laterality Date   ABDOMINAL HYSTERECTOMY     APPENDECTOMY     back surgery  1997   herniated disc repair   BREAST LUMPECTOMY Right    2016   CARDIAC CATHETERIZATION  2007   stent   CHOLECYSTECTOMY OPEN     CYSTOSCOPY     neg   RADIOACTIVE SEED GUIDED PARTIAL MASTECTOMY WITH AXILLARY SENTINEL LYMPH NODE BIOPSY Right 02/14/2014   Procedure: RADIOACTIVE SEED GUIDED PARTIAL MASTECTOMY WITH AXILLARY SENTINEL LYMPH NODE BIOPSY;  Surgeon: Erroll Luna, MD;  Location: [facility];  Service: General;  Laterality: Right;   SHOULDER SURGERY Right      Outpatient Encounter Medications as of 04/02/2022  Medication Sig   aspirin 81 MG tablet Take 81 mg by mouth daily.   botulinum toxin Type A (BOTOX) 100 units SOLR injection Inject 155 units every 3 month, discard remaining.   botulinum toxin Type A (BOTOX) 200 units injection Inject 200 Units into the muscle every 3 (three) months. Inject into head, neck and shoulders every 3 months   CINNAMON PO Take 1,000 mg by mouth daily.    Coenzyme Q10 (CO Q-10) 200 MG CAPS Take 1 capsule by mouth daily.     famotidine (PEPCID) 20 MG tablet Take 2 tablets (40 mg total) by mouth 2 (two) times daily.   fish oil-omega-3 fatty acids 1000 MG capsule Take 1.2 g by mouth 2 (two) times daily.   furosemide (LASIX) 20 MG tablet Take 1 tablet (20 mg total) by mouth daily.   Ginkgo Biloba 120 MG CAPS Take 1 capsule by mouth daily.     KEFLEX 250 MG capsule    levothyroxine (SYNTHROID) 100 MCG tablet Take 100 mcg by mouth daily before breakfast.   LIOTHYRONINE SODIUM PO 5 mg.    Magnesium 250 MG TABS Take 250 mg by mouth daily.   metoprolol tartrate (LOPRESSOR) 25 MG tablet Take 25 mg daily   Multiple  Vitamin (MULTIVITAMIN) capsule Take 1 capsule by mouth daily.   nitroGLYCERIN (NITROSTAT) 0.4 MG SL tablet DISSOLVE ONE TABLET UNDER THE TONGUE EVERY 5 MINUTES AS NEEDED FOR CHEST PAIN.  DO NOT EXCEED A TOTAL OF 3 DOSES IN 15 MINUTES   ondansetron (ZOFRAN) 4 MG tablet TAKE 1 TABLET BY MOUTH EVERY 8 HOURS AS NEEDED FOR NAUSEA FOR VOMITING   pantoprazole (PROTONIX) 40 MG tablet TAKE 1 TABLET BY MOUTH TWICE  DAILY   potassium chloride SA (KLOR-CON M) 20 MEQ tablet TAKE 2 TABLETS BY MOUTH TWICE  DAILY AND ONE-HALF TABLET BY  MOUTH DAILY AT BEDTIME (Patient taking differently: 10 mEq once. TAKE 1 TABLETS BY MOUTH DAILY AT BEDTIME)   Probiotic Product (PROBIOTIC-10 PO) Take by mouth.   propranolol ER (INDERAL LA) 60 MG 24 hr capsule TAKE 1 CAPSULE BY MOUTH ONCE  DAILY AT  BEDTIME   Riboflavin 400 MG TABS Take 400 mg by mouth daily.   Rimegepant Sulfate (NURTEC) 75 MG TBDP DISSOLVE 1 TABLET ON THE TONGUE  EVERY OTHER DAY   simvastatin (ZOCOR) 40 MG tablet TAKE 1 TABLET BY MOUTH AT  BEDTIME   topiramate (TOPAMAX) 100 MG tablet Take 1 tablet (100 mg total) by mouth 2 (two) times daily.   Vibegron (GEMTESA PO) Take 1 tablet by mouth daily.   No facility-administered encounter medications on file as of 04/02/2022.     Today's Vitals   04/02/22 1004  BP: (!) 130/58  Pulse: 66  Resp: 18  Temp: (!) 97.5 F (36.4 C)  SpO2: 97%  Weight: 199 lb 12.8 oz (90.6 kg)   Body mass index is 32.01 kg/m.   PHYSICAL EXAM GENERAL:alert, no distress and comfortable SKIN: no rash  EYES: sclera clear NECK: without mass LYMPH:  no palpable cervical or supraclavicular lymphadenopathy  LUNGS:  normal breathing effort HEART: regular rate & rhythm, no lower extremity edema ABDOMEN: abdomen soft, non-tender and normal bowel sounds MSK: mild ttp mid upper back. normal strength/tone and coordination bilaterally.  NEURO: alert & oriented x 3 with fluent speech, no focal motor/sensory deficits. Intact peripheral vibratory  sense over the fingertips per tuning fork exam Breast exam: Symmetric without nipple discharge or inversion.  S/p right lumpectomy, incisions completely healed with mild scar tissue.  No palpable mass or nodularity in either breast or axilla that I could appreciate   CBC    Component Value Date/Time   WBC 6.3 04/02/2022 0944   WBC 6.3 03/18/2022 1542   RBC 4.09 04/02/2022 0944   HGB 12.7 04/02/2022 0944   HGB 13.9 08/15/2016 1326   HCT 36.7 04/02/2022 0944   HCT 40.0 08/15/2016 1326   PLT 182 04/02/2022 0944   PLT 164 08/15/2016 1326   MCV 89.7 04/02/2022 0944   MCV 90.9 08/15/2016 1326   MCH 31.1 04/02/2022 0944   MCHC 34.6 04/02/2022 0944   RDW 13.0 04/02/2022 0944   RDW 13.6 08/15/2016 1326   LYMPHSABS 1.5 04/02/2022 0944   LYMPHSABS 1.4 08/15/2016 1326   MONOABS 0.7 04/02/2022 0944   MONOABS 0.4 08/15/2016 1326   EOSABS 0.2 04/02/2022 0944   EOSABS 0.2 08/15/2016 1326   BASOSABS 0.0 04/02/2022 0944   BASOSABS 0.0 08/15/2016 1326     CMP     Component Value Date/Time   NA 140 04/02/2022 0944   NA 145 08/15/2016 1326   K 4.2 04/02/2022 0944   K 4.8 08/15/2016 1326   CL 106 04/02/2022 0944   CO2 27 04/02/2022 0944   CO2 22 08/15/2016 1326   GLUCOSE 149 (H) 04/02/2022 0944   GLUCOSE 129 08/15/2016 1326   BUN 29 (H) 04/02/2022 0944   BUN 15.4 08/15/2016 1326   CREATININE 1.16 (H) 04/02/2022 0944   CREATININE 1.20 (H) 06/26/2017 1531   CREATININE 1.5 (H) 08/15/2016 1326   CALCIUM 10.2 04/02/2022 0944   CALCIUM 10.4 08/15/2016 1326   PROT 7.2 04/02/2022 0944   PROT 7.0 10/29/2018 1604   PROT 7.5 08/15/2016 1326   ALBUMIN 4.4 04/02/2022 0944   ALBUMIN 4.7 10/29/2018 1604   ALBUMIN 4.2 08/15/2016 1326   AST 28 04/02/2022 0944   AST 28 08/15/2016 1326   ALT 28 04/02/2022 0944   ALT 27 08/15/2016 1326   ALKPHOS 49 04/02/2022 0944   ALKPHOS 58 08/15/2016 1326   BILITOT 1.9 (H) 04/02/2022 0944   BILITOT 1.62 (H) 08/15/2016 1326  GFRNONAA 52 (L) 04/02/2022 0944    GFRAA 42 (L) 09/12/2019 1228     ASSESSMENT & PLAN:Georgenia Jerilynn Mages Leising is a 67 y.o. female with    1.  pT1b N0 M0 stage IA invasive ductal carcinoma of right breast, grade II, ER/PR strongly positive, HER-2 negative, Oncotype RS 21 -diagnosed in 12/2013. She is s/p right breast lumpectomy AND adjuvant breast radiation. She declined adjuvant antiestrogen therapy, due to the concern of side effects. -on surveillance  -Ms. Hanway is clinically doing well.  Breast exam is benign, mammogram 12/2021 was negative.  She is over 8 years from definitive surgery.  Overall no clinical concern for recurrence -She prefers to continue annual follow-up in our clinic.   2. Neck pain, right fingertip tingling -She reports 2 year h/o neck and upper back pain, and 2 week h/o new tingling in right fingertips. -She is dropping some items but still functioning normally -Neuro exam is benign -?related to neck pain/nerve issue -F/up with PCP  3. Left breast and rib pain, intermittent LUQ abdominal pain, nausea -Ongoing for >1 year, she attributes LUQ/rib cage pain to her acid reflux  -on Protonix and Pepcid for GERD -Colonoscopy and endoscopy 05/2020 by Dr. Arelia Longest were unremarkable, she underwent esophageal dilation at the time  -She has had cardiac workup and stress test which were reportedly unremarkable (Dr. Percival Spanish is cardiologist) -Exam is benign. I recommend to contact Dr. Celesta Aver office for f/up; she agrees  4.  Dysuria, urinary urgency, pelvic pressure -She is on daily Keflex and Azo for UTI prophylaxis -Has chronic dysuria -Continue urology follow-up  5. Mild hyperbilirubinemia  -She has had mild intermittent elevated total bilirubin since at least 05/2021, up to 1.8 in the past; other LFTs are WNL -Tbili 1.9 today, exam is benign -I offered to repeat abd [place], but given the stability and benign exam we opted to continue monitoring  -She knows to contact me if she develops RUQ pain or signs of  juandice  6. Osteopenia -last DEXA was in 06/2016, no high risk of fracture. -DEXA 04/21/19 showed osteopenia T score -2.3 at left femur neck -I encouraged her to optimize calcium and vitamin D and increase weight bearing exercises  -Repeat in 2024 per PCP   7. CKD, Stage III -improved since 2022, follow-up nephrology    PLAN: -Recent mammogram, today's labs, and chart reviewed -Continue breast cancer surveillance -Next b/l mammo 12/2022 -Repeat DEXA this year, per PCP -F/up GI for LUQ pain  -F/up PCP for routine health and chronic issues -Lab and f/up with me in 1 year   Orders Placed This Encounter  Procedures   MM 3D SCREENING MAMMOGRAM BILATERAL BREAST    Standing Status:   Future    Standing Expiration Date:   04/02/2023    Order Specific Question:   Reason for Exam (SYMPTOM  OR DIAGNOSIS REQUIRED)    Answer:   h/o R breast cancer s/p lumpectomy and RT 2016    Order Specific Question:   Preferred imaging location?    Answer:   [facility]      All questions were answered. The patient knows to call the clinic with any problems, questions or concerns. No barriers to learning were detected. I spent 20 minutes counseling the patient face to face. The total time spent in the appointment was 30 minutes and more than 50% was on counseling, review of test results, and coordination of care.   Cira Rue, NP-C 04/02/2022

## 2022-04-02 ENCOUNTER — Encounter: Payer: Self-pay | Admitting: Nurse Practitioner

## 2022-04-02 ENCOUNTER — Inpatient Hospital Stay: Payer: 59 | Attending: Nurse Practitioner

## 2022-04-02 ENCOUNTER — Inpatient Hospital Stay (HOSPITAL_BASED_OUTPATIENT_CLINIC_OR_DEPARTMENT_OTHER): Payer: 59 | Admitting: Nurse Practitioner

## 2022-04-02 VITALS — BP 130/58 | HR 66 | Temp 97.5°F | Resp 18 | Wt 199.8 lb

## 2022-04-02 DIAGNOSIS — R3915 Urgency of urination: Secondary | ICD-10-CM | POA: Diagnosis not present

## 2022-04-02 DIAGNOSIS — R300 Dysuria: Secondary | ICD-10-CM | POA: Insufficient documentation

## 2022-04-02 DIAGNOSIS — M542 Cervicalgia: Secondary | ICD-10-CM | POA: Insufficient documentation

## 2022-04-02 DIAGNOSIS — Z923 Personal history of irradiation: Secondary | ICD-10-CM | POA: Diagnosis not present

## 2022-04-02 DIAGNOSIS — C50411 Malignant neoplasm of upper-outer quadrant of right female breast: Secondary | ICD-10-CM

## 2022-04-02 DIAGNOSIS — Z87440 Personal history of urinary (tract) infections: Secondary | ICD-10-CM | POA: Insufficient documentation

## 2022-04-02 DIAGNOSIS — R1012 Left upper quadrant pain: Secondary | ICD-10-CM | POA: Insufficient documentation

## 2022-04-02 DIAGNOSIS — Z1231 Encounter for screening mammogram for malignant neoplasm of breast: Secondary | ICD-10-CM | POA: Diagnosis not present

## 2022-04-02 DIAGNOSIS — Z170 Estrogen receptor positive status [ER+]: Secondary | ICD-10-CM | POA: Diagnosis not present

## 2022-04-02 DIAGNOSIS — K219 Gastro-esophageal reflux disease without esophagitis: Secondary | ICD-10-CM | POA: Insufficient documentation

## 2022-04-02 DIAGNOSIS — Z853 Personal history of malignant neoplasm of breast: Secondary | ICD-10-CM | POA: Diagnosis not present

## 2022-04-02 DIAGNOSIS — M85852 Other specified disorders of bone density and structure, left thigh: Secondary | ICD-10-CM | POA: Insufficient documentation

## 2022-04-02 DIAGNOSIS — N183 Chronic kidney disease, stage 3 unspecified: Secondary | ICD-10-CM | POA: Diagnosis not present

## 2022-04-02 DIAGNOSIS — Z9049 Acquired absence of other specified parts of digestive tract: Secondary | ICD-10-CM | POA: Insufficient documentation

## 2022-04-02 LAB — CBC WITH DIFFERENTIAL (CANCER CENTER ONLY)
Abs Immature Granulocytes: 0.01 10*3/uL (ref 0.00–0.07)
Basophils Absolute: 0 10*3/uL (ref 0.0–0.1)
Basophils Relative: 1 %
Eosinophils Absolute: 0.2 10*3/uL (ref 0.0–0.5)
Eosinophils Relative: 2 %
HCT: 36.7 % (ref 36.0–46.0)
Hemoglobin: 12.7 g/dL (ref 12.0–15.0)
Immature Granulocytes: 0 %
Lymphocytes Relative: 23 %
Lymphs Abs: 1.5 10*3/uL (ref 0.7–4.0)
MCH: 31.1 pg (ref 26.0–34.0)
MCHC: 34.6 g/dL (ref 30.0–36.0)
MCV: 89.7 fL (ref 80.0–100.0)
Monocytes Absolute: 0.7 10*3/uL (ref 0.1–1.0)
Monocytes Relative: 11 %
Neutro Abs: 4 10*3/uL (ref 1.7–7.7)
Neutrophils Relative %: 63 %
Platelet Count: 182 10*3/uL (ref 150–400)
RBC: 4.09 MIL/uL (ref 3.87–5.11)
RDW: 13 % (ref 11.5–15.5)
WBC Count: 6.3 10*3/uL (ref 4.0–10.5)
nRBC: 0 % (ref 0.0–0.2)

## 2022-04-02 LAB — CMP (CANCER CENTER ONLY)
ALT: 28 U/L (ref 0–44)
AST: 28 U/L (ref 15–41)
Albumin: 4.4 g/dL (ref 3.5–5.0)
Alkaline Phosphatase: 49 U/L (ref 38–126)
Anion gap: 7 (ref 5–15)
BUN: 29 mg/dL — ABNORMAL HIGH (ref 8–23)
CO2: 27 mmol/L (ref 22–32)
Calcium: 10.2 mg/dL (ref 8.9–10.3)
Chloride: 106 mmol/L (ref 98–111)
Creatinine: 1.16 mg/dL — ABNORMAL HIGH (ref 0.44–1.00)
GFR, Estimated: 52 mL/min — ABNORMAL LOW (ref >60)
Glucose, Bld: 149 mg/dL — ABNORMAL HIGH (ref 70–99)
Potassium: 4.2 mmol/L (ref 3.5–5.1)
Sodium: 140 mmol/L (ref 135–145)
Total Bilirubin: 1.9 mg/dL — ABNORMAL HIGH (ref 0.3–1.2)
Total Protein: 7.2 g/dL (ref 6.5–8.1)

## 2022-04-02 LAB — VITAMIN D 25 HYDROXY (VIT D DEFICIENCY, FRACTURES): Vit D, 25-Hydroxy: 40.24 ng/mL (ref 30–100)

## 2022-04-03 ENCOUNTER — Other Ambulatory Visit (HOSPITAL_COMMUNITY): Payer: Self-pay

## 2022-04-03 ENCOUNTER — Other Ambulatory Visit: Payer: Self-pay

## 2022-04-04 ENCOUNTER — Ambulatory Visit
Admission: RE | Admit: 2022-04-04 | Discharge: 2022-04-04 | Disposition: A | Discharge status: Home / Self Care | Payer: 59 | Source: Ambulatory Visit | Attending: Family Medicine | Admitting: Family Medicine

## 2022-04-04 DIAGNOSIS — E2839 Other primary ovarian failure: Secondary | ICD-10-CM

## 2022-04-04 DIAGNOSIS — Z780 Asymptomatic menopausal state: Secondary | ICD-10-CM | POA: Diagnosis not present

## 2022-04-04 DIAGNOSIS — M85852 Other specified disorders of bone density and structure, left thigh: Secondary | ICD-10-CM | POA: Diagnosis not present

## 2022-04-07 ENCOUNTER — Other Ambulatory Visit: Payer: Self-pay

## 2022-04-14 DIAGNOSIS — E890 Postprocedural hypothyroidism: Secondary | ICD-10-CM | POA: Diagnosis not present

## 2022-04-15 ENCOUNTER — Ambulatory Visit (INDEPENDENT_AMBULATORY_CARE_PROVIDER_SITE_OTHER): Payer: 59 | Admitting: Neurology

## 2022-04-15 VITALS — BP 137/70 | HR 60 | Ht 66.0 in | Wt 199.0 lb

## 2022-04-15 DIAGNOSIS — G43009 Migraine without aura, not intractable, without status migrainosus: Secondary | ICD-10-CM

## 2022-04-15 DIAGNOSIS — G43719 Chronic migraine without aura, intractable, without status migrainosus: Secondary | ICD-10-CM

## 2022-04-15 MED ORDER — ONABOTULINUMTOXINA 200 UNITS IJ SOLR
200.0000 [IU] | Freq: Once | INTRAMUSCULAR | Status: AC
  Administered 2022-04-15: 200 [IU] via INTRAMUSCULAR

## 2022-04-15 NOTE — Progress Notes (Signed)
   BOTOX PROCEDURE NOTE FOR MIGRAINE HEADACHE  HISTORY: Here today for her 3rd Botox injection.  Was having 3-4 migraines weekly, now has periods of time with no headache for 1-2 weeks, but comes back. Remains on Topamax, Nurtec. Takes Aleve for migraine. She did have 2 weeks of continuous headache. Triggered by weather.   Description of procedure:  The patient was placed in a sitting position. The standard protocol was used for Botox as follows, with 5 units of Botox injected at each site:   -Procerus muscle, midline injection  -Corrugator muscle, bilateral injection  -Frontalis muscle, bilateral injection, with 2 sites each side, medial injection was performed in the upper one third of the frontalis muscle, in the region vertical from the medial inferior edge of the superior orbital rim. The lateral injection was again in the upper one third of the forehead vertically above the lateral limbus of the cornea, 1.5 cm lateral to the medial injection site.  -Temporalis muscle injection, 4 sites, bilaterally. The first injection was 3 cm above the tragus of the ear, second injection site was 1.5 cm to 3 cm up from the first injection site in line with the tragus of the ear. The third injection site was 1.5-3 cm forward between the first 2 injection sites. The fourth injection site was 1.5 cm posterior to the second injection site.  -Occipitalis muscle injection, 3 sites, bilaterally. The first injection was done one half way between the occipital protuberance and the tip of the mastoid process behind the ear. The second injection site was done lateral and superior to the first, 1 fingerbreadth from the first injection. The third injection site was 1 fingerbreadth superiorly and medially from the first injection site.  -Cervical paraspinal muscle injection, 2 sites, bilateral, the first injection site was 1 cm from the midline of the cervical spine, 3 cm inferior to the lower border of the occipital  protuberance. The second injection site was 1.5 cm superiorly and laterally to the first injection site.  -Trapezius muscle injection was performed at 3 sites, bilaterally. The first injection site was in the upper trapezius muscle halfway between the inflection point of the neck, and the acromion. The second injection site was one half way between the acromion and the first injection site. The third injection was done between the first injection site and the inflection point of the neck.  A 200 unit bottle of Botox was used, 155 units were injected, the rest of the Botox was wasted. The patient tolerated the procedure well, there were no complications of the above procedure.  Botox NDC [id number] Lot number [id number] Expiration date 06/27/2024 SP

## 2022-04-15 NOTE — Progress Notes (Signed)
Botox 200 units x 1 vial  Ndc-0023-3921-02 [id number] Exp-06/2024 SP   Witnessed by Joellen Jersey

## 2022-04-17 ENCOUNTER — Other Ambulatory Visit: Payer: Self-pay | Admitting: Family Medicine

## 2022-04-17 ENCOUNTER — Other Ambulatory Visit: Payer: Self-pay | Admitting: Neurology

## 2022-04-17 DIAGNOSIS — G43719 Chronic migraine without aura, intractable, without status migrainosus: Secondary | ICD-10-CM

## 2022-04-17 DIAGNOSIS — G43009 Migraine without aura, not intractable, without status migrainosus: Secondary | ICD-10-CM

## 2022-04-21 DIAGNOSIS — E559 Vitamin D deficiency, unspecified: Secondary | ICD-10-CM | POA: Diagnosis not present

## 2022-04-21 DIAGNOSIS — R002 Palpitations: Secondary | ICD-10-CM | POA: Diagnosis not present

## 2022-04-21 DIAGNOSIS — E890 Postprocedural hypothyroidism: Secondary | ICD-10-CM | POA: Diagnosis not present

## 2022-04-30 ENCOUNTER — Other Ambulatory Visit (HOSPITAL_COMMUNITY): Payer: Self-pay

## 2022-05-09 ENCOUNTER — Other Ambulatory Visit: Payer: Self-pay

## 2022-05-09 MED ORDER — METOPROLOL TARTRATE 25 MG PO TABS: ORAL_TABLET | ORAL | 0 refills | Status: DC

## 2022-05-11 ENCOUNTER — Other Ambulatory Visit: Payer: Self-pay | Admitting: Family Medicine

## 2022-05-13 DIAGNOSIS — R3129 Other microscopic hematuria: Secondary | ICD-10-CM | POA: Diagnosis not present

## 2022-05-13 DIAGNOSIS — N3020 Other chronic cystitis without hematuria: Secondary | ICD-10-CM | POA: Diagnosis not present

## 2022-05-13 DIAGNOSIS — R8271 Bacteriuria: Secondary | ICD-10-CM | POA: Diagnosis not present

## 2022-05-13 DIAGNOSIS — K5730 Diverticulosis of large intestine without perforation or abscess without bleeding: Secondary | ICD-10-CM | POA: Diagnosis not present

## 2022-05-13 DIAGNOSIS — N3281 Overactive bladder: Secondary | ICD-10-CM | POA: Diagnosis not present

## 2022-05-15 ENCOUNTER — Other Ambulatory Visit: Payer: Self-pay | Admitting: Cardiology

## 2022-06-02 DIAGNOSIS — E890 Postprocedural hypothyroidism: Secondary | ICD-10-CM | POA: Diagnosis not present

## 2022-06-09 ENCOUNTER — Telehealth: Payer: Self-pay | Admitting: Neurology

## 2022-06-09 MED ORDER — ONABOTULINUMTOXINA 200 UNITS IJ SOLR: 200.0000 [IU] | Freq: Once | INTRAMUSCULAR | 0 refills | Status: AC

## 2022-06-09 NOTE — Addendum Note (Signed)
Addended by: Deatra James on: 06/09/2022 04:03 PM   Modules accepted: Orders

## 2022-06-09 NOTE — Telephone Encounter (Signed)
New Botox auth needed before 6/11 appt.  Approved via [facility] portal: [id number] (06/09/22-06/08/23) as well as through [facility] via Cover My Meds: [id number] (06/09/22-09/09/22).  Please send Rx to [facility], thank you!

## 2022-06-19 NOTE — Telephone Encounter (Signed)
Botox will be delivered 5/29 per Misty Stanley from [place]

## 2022-06-24 ENCOUNTER — Other Ambulatory Visit: Payer: Self-pay

## 2022-06-27 ENCOUNTER — Other Ambulatory Visit (HOSPITAL_COMMUNITY): Payer: Self-pay

## 2022-06-30 ENCOUNTER — Other Ambulatory Visit (HOSPITAL_COMMUNITY): Payer: Self-pay

## 2022-07-03 ENCOUNTER — Other Ambulatory Visit: Payer: Self-pay | Admitting: Cardiology

## 2022-07-04 ENCOUNTER — Other Ambulatory Visit: Payer: Self-pay | Admitting: Cardiology

## 2022-07-08 ENCOUNTER — Ambulatory Visit (INDEPENDENT_AMBULATORY_CARE_PROVIDER_SITE_OTHER): Payer: 59 | Admitting: Neurology

## 2022-07-08 VITALS — BP 132/76

## 2022-07-08 DIAGNOSIS — G43719 Chronic migraine without aura, intractable, without status migrainosus: Secondary | ICD-10-CM

## 2022-07-08 DIAGNOSIS — G43009 Migraine without aura, not intractable, without status migrainosus: Secondary | ICD-10-CM

## 2022-07-08 MED ORDER — NURTEC 75 MG PO TBDP: ORAL_TABLET | ORAL | 11 refills | Status: DC

## 2022-07-08 MED ORDER — ONABOTULINUMTOXINA 200 UNITS IJ SOLR
155.0000 [IU] | Freq: Once | INTRAMUSCULAR | Status: AC
Start: 2022-07-08 — End: 2022-07-08
  Administered 2022-07-08: 155 [IU] via INTRAMUSCULAR

## 2022-07-08 NOTE — Progress Notes (Signed)
BOTOX PROCEDURE NOTE FOR MIGRAINE HEADACHE  HISTORY: Here for her 4th Botox injection. Last was 04/15/22 by me. Really has a lot less migraines. Since last Botox only 2 migraines. Will go 4 days without headache, then may have mild headache. She is really happy with not having headache daily. Remains on Topamax 100 mg BID, mentions concern for reading about long term side effects. Uses Nurtec, can take every other day, but will use for a few days if she feels migraine is coming on.  Botox is 80% helpful. Is very happy with it.   Description of procedure:  The patient was placed in a sitting position. The standard protocol was used for Botox as follows, with 5 units of Botox injected at each site:  -Procerus muscle, midline injection  -Corrugator muscle, bilateral injection  -Frontalis muscle, bilateral injection, with 2 sites each side, medial injection was performed in the upper one third of the frontalis muscle, in the region vertical from the medial inferior edge of the superior orbital rim. The lateral injection was again in the upper one third of the forehead vertically above the lateral limbus of the cornea, 1.5 cm lateral to the medial injection site.  -Temporalis muscle injection, 4 sites, bilaterally. The first injection was 3 cm above the tragus of the ear, second injection site was 1.5 cm to 3 cm up from the first injection site in line with the tragus of the ear. The third injection site was 1.5-3 cm forward between the first 2 injection sites. The fourth injection site was 1.5 cm posterior to the second injection site.  -Occipitalis muscle injection, 3 sites, bilaterally. The first injection was done one half way between the occipital protuberance and the tip of the mastoid process behind the ear. The second injection site was done lateral and superior to the first, 1 fingerbreadth from the first injection. The third injection site was 1 fingerbreadth superiorly and medially from the  first injection site.  -Cervical paraspinal muscle injection, 2 sites, bilateral, the first injection site was 1 cm from the midline of the cervical spine, 3 cm inferior to the lower border of the occipital protuberance. The second injection site was 1.5 cm superiorly and laterally to the first injection site.  -Trapezius muscle injection was performed at 3 sites, bilaterally. The first injection site was in the upper trapezius muscle halfway between the inflection point of the neck, and the acromion. The second injection site was one half way between the acromion and the first injection site. The third injection was done between the first injection site and the inflection point of the neck.   A 200 unit bottle of Botox was used, 155 units were injected, the rest of the Botox was wasted. The patient tolerated the procedure well, there were no complications of the above procedure.  Botox [facility] 1610-9604-54 Lot number [id number] Expiration date 11/2024 SP  Physical Exam  General: The patient is alert and cooperative at the time of the examination.  Skin: No significant peripheral edema is noted.  Neurologic Exam  Mental status: The patient is alert and oriented x 3 at the time of the examination. The patient has apparent normal recent and remote memory, with an apparently normal attention span and concentration ability.  Cranial nerves: Facial symmetry is present. Speech is normal, no aphasia or dysarthria is noted. Extraocular movements are full. Visual fields are full.  Motor: The patient has good strength in all 4 extremities.  Sensory examination: Soft touch sensation  is symmetric on the face, arms, and legs.  Coordination: The patient has good finger-nose-finger bilaterally  Gait and station: The patient has a normal gait.   1.  Chronic migraine headaches -Excellent benefit with Botox, 80% improvement we will continue Botox every 3 months for migraine prevention -Continue Nurtec up  to every other day as needed for migraine prevention, helps for rescue  -Reduce Topamax from 100 mg twice a day to 100 mg daily for 1 month, if no increase in headache. -Tried and failed Effexor, Topamax, Inderal, nortriptyline, Imitrex, Maxalt, Aimovig, Ajovy, Emgality, Depakote, gabapentin, Ubrelvy, metoprolol  -Follow-up in 3 months for Botox

## 2022-07-08 NOTE — Progress Notes (Signed)
Botox- 200 units x 1 vial Lot: [id number] Expiration: 2026/11 NDC: 0023-3921-02  Bacteriostatic 0.9% Sodium Chloride- 4mL  Lot: [id number] Expiration: 01/31/2023 NDC: 0454-0981-19  Dx: J478.295, g43.719 S/P  Witnessed by Athena Masse RN

## 2022-07-29 ENCOUNTER — Other Ambulatory Visit: Payer: Self-pay | Admitting: Neurology

## 2022-07-31 ENCOUNTER — Other Ambulatory Visit: Payer: Self-pay | Admitting: Cardiology

## 2022-08-04 ENCOUNTER — Other Ambulatory Visit: Payer: Self-pay | Admitting: Cardiology

## 2022-08-13 ENCOUNTER — Other Ambulatory Visit: Payer: Self-pay | Admitting: Cardiology

## 2022-09-08 ENCOUNTER — Other Ambulatory Visit: Payer: Self-pay | Admitting: Neurology

## 2022-09-25 NOTE — Telephone Encounter (Signed)
I tried calling in Botox delivery and was told a PA is needed. I submitted via CMM, status is pending. Key: [id number]

## 2022-09-25 NOTE — Telephone Encounter (Signed)
Received approval, Case ID #: [id number] (09/25/22-12/26/22).

## 2022-09-30 ENCOUNTER — Ambulatory Visit: Payer: 59 | Admitting: Neurology

## 2022-10-01 ENCOUNTER — Telehealth: Payer: Self-pay | Admitting: Neurology

## 2022-10-01 NOTE — Telephone Encounter (Signed)
[facility]) schedule delivery Botox on 10/07/22.

## 2022-10-02 ENCOUNTER — Ambulatory Visit: Payer: 59 | Admitting: Neurology

## 2022-10-02 ENCOUNTER — Other Ambulatory Visit: Payer: Self-pay | Admitting: Neurology

## 2022-10-14 ENCOUNTER — Other Ambulatory Visit: Payer: Self-pay | Admitting: Cardiology

## 2022-10-17 ENCOUNTER — Other Ambulatory Visit: Payer: Self-pay | Admitting: Family Medicine

## 2022-10-19 ENCOUNTER — Other Ambulatory Visit: Payer: Self-pay | Admitting: Cardiology

## 2022-10-21 ENCOUNTER — Other Ambulatory Visit: Payer: Self-pay | Admitting: Cardiology

## 2022-10-26 ENCOUNTER — Other Ambulatory Visit: Payer: Self-pay | Admitting: Cardiology

## 2022-11-03 ENCOUNTER — Other Ambulatory Visit: Payer: Self-pay | Admitting: Cardiology

## 2022-11-04 ENCOUNTER — Other Ambulatory Visit: Payer: Self-pay | Admitting: Family Medicine

## 2022-11-11 ENCOUNTER — Telehealth: Payer: Self-pay | Admitting: Cardiology

## 2022-11-11 NOTE — Telephone Encounter (Signed)
*  STAT* If patient is at the pharmacy, call can be transferred to refill team.   1. Which medications need to be refilled? (please list name of each medication and dose if known) metoprolol tartrate (LOPRESSOR) 25 MG tablet   2. Which pharmacy/location (including street and city if local pharmacy) is medication to be sent to?  [facility] - [place], [place] - [address]    3. Do they need a 30 day or 90 day supply? 90

## 2022-11-12 MED ORDER — METOPROLOL TARTRATE 25 MG PO TABS: 25.0000 mg | ORAL_TABLET | Freq: Every day | ORAL | 3 refills | Status: DC

## 2022-11-29 NOTE — Progress Notes (Unsigned)
  Cardiology Office Note:   Date:  12/02/2022  ID:  Tuwanda, Vokes [birth year], MRN [id number] PCP: Judy Pimple, MD  [facility] Providers Cardiologist:  Rollene Rotunda, MD {  History of Present Illness:   [name] [name] is a 67 y.o. female who presents for follow up of palpitations..  She also has a history of coronary disease.  In 2017 she had a Lexiscan Myoview prior to shoulder surgery.   She had another stress test in 2020 that was low risk for any ischemia.    The patient returns for follow-up.  In the last year she has had no new cardiovascular complaints.  Her biggest issue has been recurrent UTIs.  She has had some varicose veins.  She does not walk as much as I would like but she stays active in the house up and down stairs. The patient denies any new symptoms such as chest discomfort, neck or arm discomfort. There has been no new shortness of breath, PND or orthopnea. There have been no reported palpitations, presyncope or syncope.   ROS: As stated in the HPI and negative for all other systems.  Studies Reviewed:    EKG:   EKG Interpretation Date/Time:  Tuesday December 02 2022 13:43:04 EST Ventricular Rate:  59 PR Interval:  148 QRS Duration:  88 QT Interval:  438 QTC Calculation: 433 R Axis:   -42  Text Interpretation: Sinus bradycardia Leftward axis When compared with ECG of 21-Oct-2011 14:48, No significant change since last tracing Confirmed by Rollene Rotunda ([id number]) on 12/02/2022 1:54:15 PM    Risk Assessment/Calculations:              Physical Exam:   VS:  BP 118/68 (BP Location: Left Arm, Patient Position: Sitting, Cuff Size: Large)   Pulse (!) 59   Ht 5\' 6"  (1.676 m)   Wt 194 lb 9.6 oz (88.3 kg)   SpO2 96%   BMI 31.41 kg/m    Wt Readings from Last 3 Encounters:  12/02/22 194 lb 9.6 oz (88.3 kg)  04/15/22 199 lb (90.3 kg)  04/02/22 199 lb 12.8 oz (90.6 kg)     GEN: Well nourished, well developed in no acute distress NECK: No  JVD; No carotid bruits CARDIAC: RRR, no murmurs, rubs, gallops RESPIRATORY:  Clear to auscultation without rales, wheezing or rhonchi  ABDOMEN: Soft, non-tender, non-distended EXTREMITIES:  No edema; No deformity   ASSESSMENT AND PLAN:   CHEST PAIN/CAD - She has had some nonanginal chest pain which is unchanged since her stress test in 2020.  She continues with aggressive risk reduction.  No change in therapy.    DYSLIPIDEMIA -  LDL was 52 with an HDL of 84.6.  She changed her diet and her LDL is improved and at target.  No change in therapy.    PALPITATIONS: She is on 2 different beta-blockers intentionally because when she is try to take herself off of one of them in the past she has had palpitations.  This regimen works and I will continue.  HTN:  Her blood pressure is controlled.  No change in therapy.      Follow up with me in 1 year  Signed, Rollene Rotunda, MD

## 2022-12-02 ENCOUNTER — Encounter: Payer: Self-pay | Admitting: Cardiology

## 2022-12-02 ENCOUNTER — Ambulatory Visit: Payer: 59 | Attending: Cardiology | Admitting: Cardiology

## 2022-12-02 VITALS — BP 118/68 | HR 59 | Ht 66.0 in | Wt 194.6 lb

## 2022-12-02 DIAGNOSIS — N183 Chronic kidney disease, stage 3 unspecified: Secondary | ICD-10-CM

## 2022-12-02 DIAGNOSIS — E785 Hyperlipidemia, unspecified: Secondary | ICD-10-CM

## 2022-12-02 DIAGNOSIS — I10 Essential (primary) hypertension: Secondary | ICD-10-CM | POA: Diagnosis not present

## 2022-12-02 DIAGNOSIS — R072 Precordial pain: Secondary | ICD-10-CM | POA: Diagnosis not present

## 2022-12-02 DIAGNOSIS — R002 Palpitations: Secondary | ICD-10-CM | POA: Diagnosis not present

## 2022-12-02 MED ORDER — NITROGLYCERIN 0.4 MG SL SUBL: 0.4000 mg | SUBLINGUAL_TABLET | Freq: Every day | SUBLINGUAL | 6 refills | Status: AC | PRN

## 2022-12-02 NOTE — Patient Instructions (Signed)
Medication Instructions:  Refill for Nitroglycerin sent. *If you need a refill on your cardiac medications before your next appointment, please call your pharmacy*    Follow-Up: At [facility], you and your health needs are our priority.  As part of our continuing mission to provide you with exceptional heart care, we have created designated Provider Care Teams.  These Care Teams include your primary Cardiologist (physician) and Advanced Practice Providers (APPs -  Physician Assistants and Nurse Practitioners) who all work together to provide you with the care you need, when you need it.  We recommend signing up for the patient portal called "MyChart".  Sign up information is provided on this After Visit Summary.  MyChart is used to connect with patients for Virtual Visits (Telemedicine).  Patients are able to view lab/test results, encounter notes, upcoming appointments, etc.  Non-urgent messages can be sent to your provider as well.   To learn more about what you can do with MyChart, go to [email or web address].    Your next appointment:   12 month(s)  Provider:   Rollene Rotunda, MD

## 2022-12-10 ENCOUNTER — Other Ambulatory Visit: Payer: Self-pay | Admitting: Neurology

## 2022-12-16 ENCOUNTER — Ambulatory Visit (INDEPENDENT_AMBULATORY_CARE_PROVIDER_SITE_OTHER): Payer: 59 | Admitting: Neurology

## 2022-12-16 ENCOUNTER — Other Ambulatory Visit: Payer: Self-pay | Admitting: Neurology

## 2022-12-16 VITALS — BP 133/70 | HR 98

## 2022-12-16 DIAGNOSIS — G43719 Chronic migraine without aura, intractable, without status migrainosus: Secondary | ICD-10-CM

## 2022-12-16 DIAGNOSIS — G43009 Migraine without aura, not intractable, without status migrainosus: Secondary | ICD-10-CM | POA: Diagnosis not present

## 2022-12-16 MED ORDER — ONABOTULINUMTOXINA 200 UNITS IJ SOLR
155.0000 [IU] | Freq: Once | INTRAMUSCULAR | Status: AC
  Administered 2022-12-16: 155 [IU] via INTRAMUSCULAR

## 2022-12-16 NOTE — Progress Notes (Signed)
Botox- 200 units x 1 vial Lot: [id number] Expiration: 12.2026 NDC: 0023-3921-02  Bacteriostatic 0.9% Sodium Chloride- 4 mL  Lot: [id number] Expiration: 04.01.2025 NDC: 8657846962  Dx: G43.009 S/P Witnessed by Sheryle Hail, CMA

## 2022-12-16 NOTE — Progress Notes (Signed)
   BOTOX PROCEDURE NOTE FOR MIGRAINE HEADACHE  HISTORY: Here for her fifth Botox injection.  Last was 07/08/2022. We are late getting Botox, she doesn't recall why, could have been dealing with recurrent bad UTIs. Having more headaches, more on the left side which is typical, but now more on the Vertex. Cut back Topamax to 100 mg at bedtime. Takes Nurtec sometimes, saves it for severe migraines. She is afraid of taking too much medication with her UTI issues. 2 mild to moderate headaches, migraines 2 a month.   Description of procedure:  The patient was placed in a sitting position. The standard protocol was used for Botox as follows, with 5 units of Botox injected at each site:   -Procerus muscle, midline injection  -Corrugator muscle, bilateral injection  -Frontalis muscle, bilateral injection, with 2 sites each side, medial injection was performed in the upper one third of the frontalis muscle, in the region vertical from the medial inferior edge of the superior orbital rim. The lateral injection was again in the upper one third of the forehead vertically above the lateral limbus of the cornea, 1.5 cm lateral to the medial injection site.  -Temporalis muscle injection, 4 sites, bilaterally. The first injection was 3 cm above the tragus of the ear, second injection site was 1.5 cm to 3 cm up from the first injection site in line with the tragus of the ear. The third injection site was 1.5-3 cm forward between the first 2 injection sites. The fourth injection site was 1.5 cm posterior to the second injection site.  -Occipitalis muscle injection, 3 sites, bilaterally. The first injection was done one half way between the occipital protuberance and the tip of the mastoid process behind the ear. The second injection site was done lateral and superior to the first, 1 fingerbreadth from the first injection. The third injection site was 1 fingerbreadth superiorly and medially from the first injection  site.  -Cervical paraspinal muscle injection, 2 sites, bilateral, the first injection site was 1 cm from the midline of the cervical spine, 3 cm inferior to the lower border of the occipital protuberance. The second injection site was 1.5 cm superiorly and laterally to the first injection site.  -Trapezius muscle injection was performed at 3 sites, bilaterally. The first injection site was in the upper trapezius muscle halfway between the inflection point of the neck, and the acromion. The second injection site was one half way between the acromion and the first injection site. The third injection was done between the first injection site and the inflection point of the neck.   A 200 unit bottle of Botox was used, 155 units were injected, the rest of the Botox was wasted. The patient tolerated the procedure well, there were no complications of the above procedure.  Botox NDC 4098-1191-47 Lot number [id number] Expiration date 12/2024 SP  We are going to continue current medications.  Continue Topamax 100 mg at bedtime for migraine prevention.  She is very scattered today, reports completely miserable from recurrent UTIs.  We will get back to track on Botox, she would like to come off Topamax ultimately.

## 2022-12-30 ENCOUNTER — Other Ambulatory Visit: Payer: Self-pay | Admitting: Neurology

## 2023-01-05 ENCOUNTER — Other Ambulatory Visit: Payer: Self-pay | Admitting: Family Medicine

## 2023-01-05 ENCOUNTER — Ambulatory Visit
Admission: RE | Admit: 2023-01-05 | Discharge: 2023-01-05 | Disposition: A | Discharge status: Home / Self Care | Payer: 59 | Source: Ambulatory Visit | Attending: Nurse Practitioner

## 2023-01-05 ENCOUNTER — Other Ambulatory Visit: Payer: Self-pay | Admitting: Cardiology

## 2023-01-05 DIAGNOSIS — Z1231 Encounter for screening mammogram for malignant neoplasm of breast: Secondary | ICD-10-CM | POA: Diagnosis not present

## 2023-02-16 NOTE — Telephone Encounter (Signed)
Submitted auth renewal via CMM, status is pending. Key: [id number]

## 2023-02-16 NOTE — Telephone Encounter (Signed)
Auth#: [id number] (02/16/23-05/17/23)

## 2023-03-05 ENCOUNTER — Telehealth: Payer: Self-pay | Admitting: Hematology

## 2023-03-05 NOTE — Telephone Encounter (Signed)
 Rescheduled appointments per provider request. Left the patient a voicemail with the updated date and times. Patient will be mailed an appointment reminder.

## 2023-03-05 NOTE — Telephone Encounter (Signed)
 LVM asking pt to call [employer] for consent.

## 2023-03-06 IMAGING — US US RENAL
1 series · 14 of 25 positions shown · non-contrast
Comparison: CT 03/01/2018.  Ultrasound 05/30/2016.

CLINICAL DATA: Chronic renal disease.

EXAM:
RENAL / URINARY TRACT ULTRASOUND COMPLETE

[Series 1: us renal · 0.23mm/px · 14 of 28 slices shown]
[im 1/28]
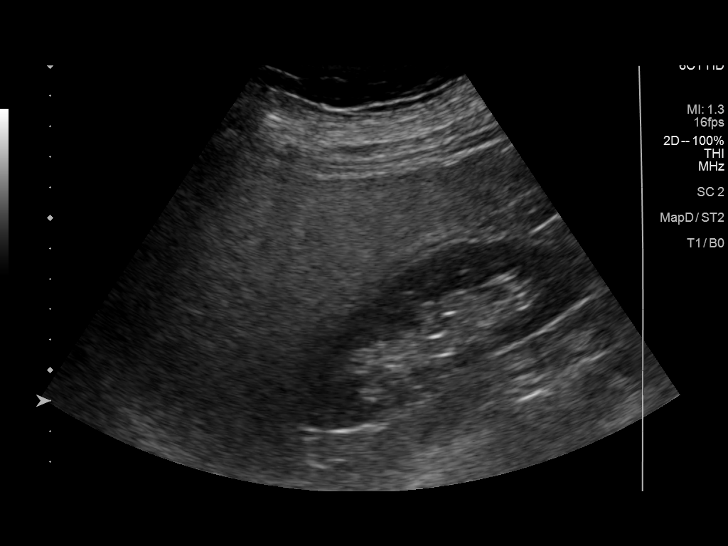
[im 3/28]
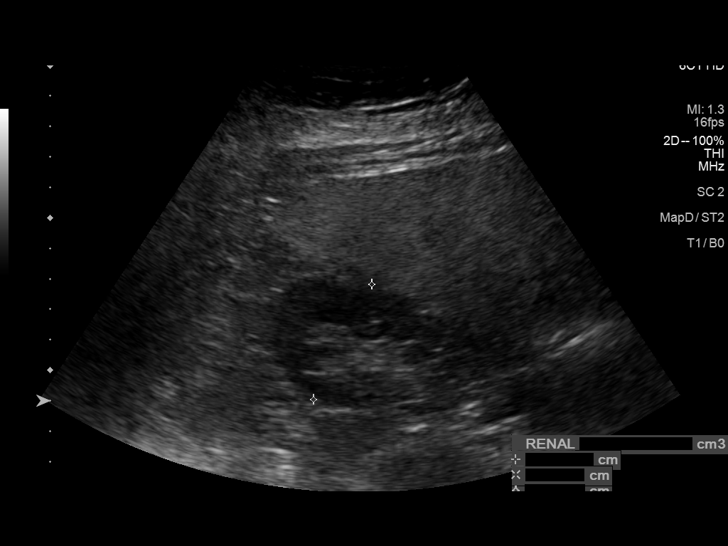
[im 5/28]
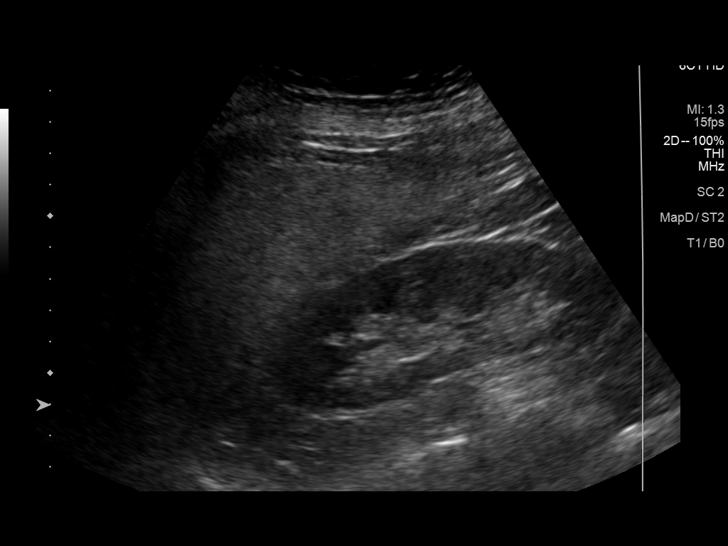
[im 7/28]
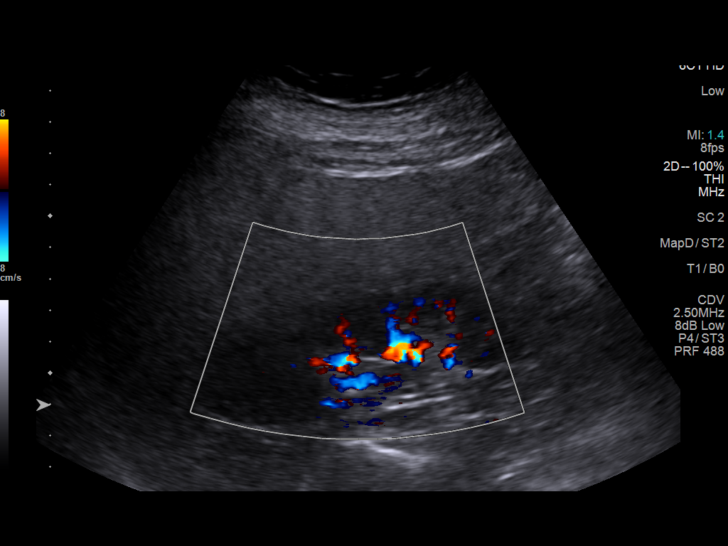
[im 10/28]
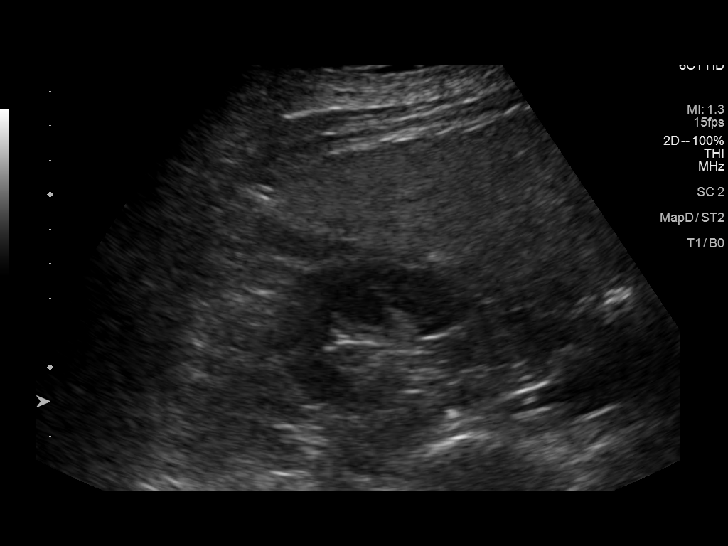
[im 11/28]
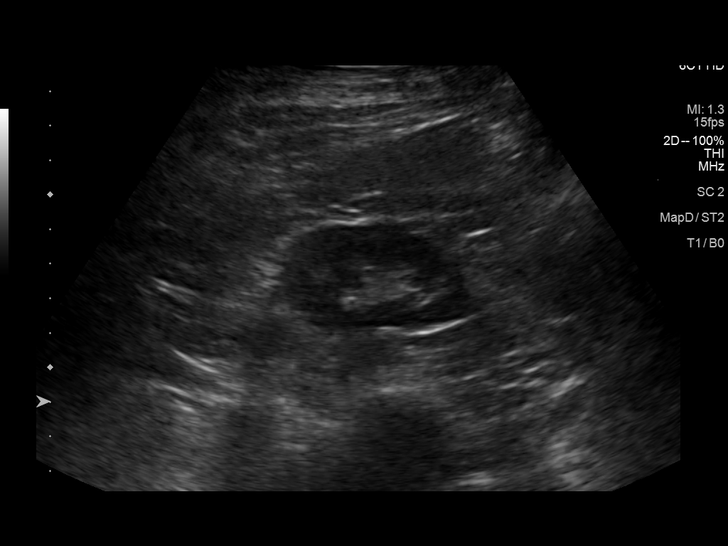
[im 13/28]
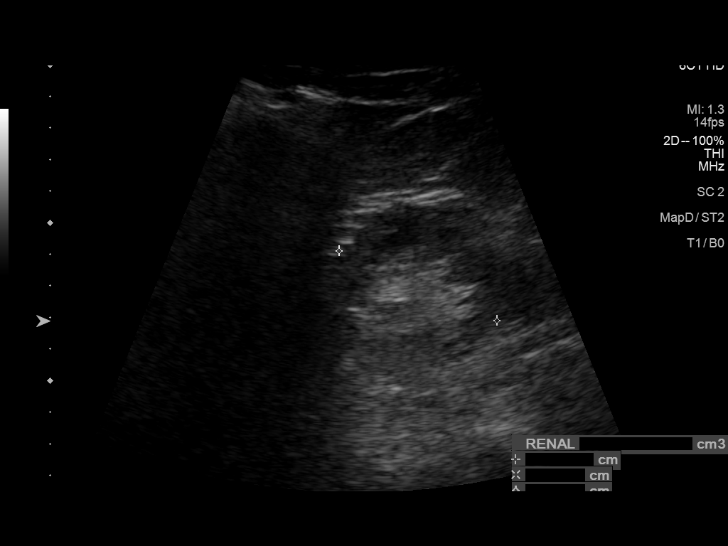
[im 15/28]
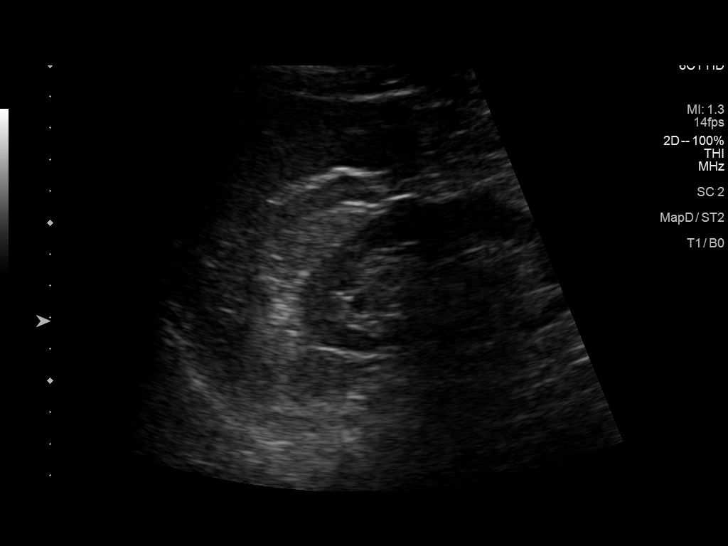
[im 17/28]
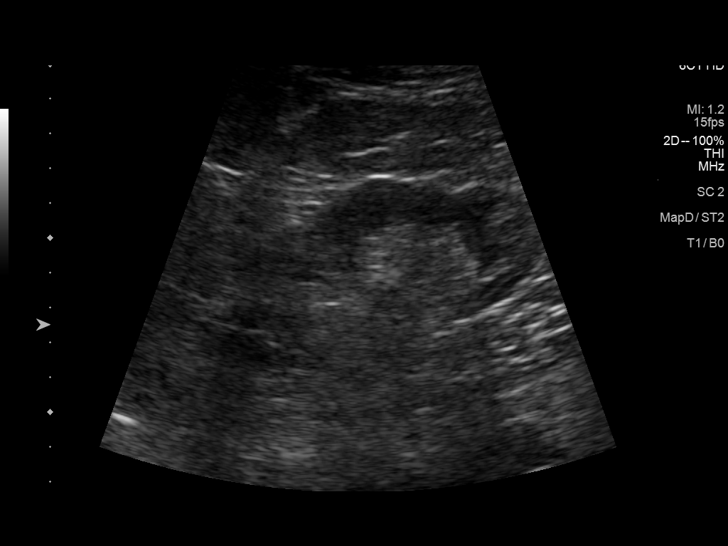
[im 19/28]
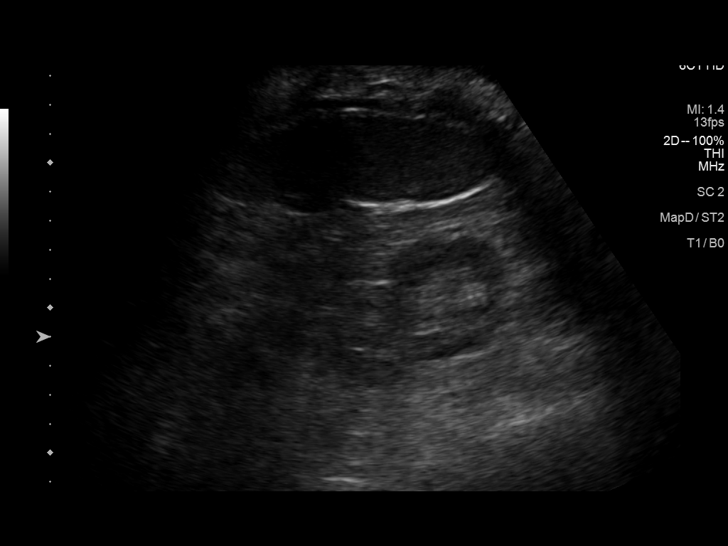
[im 21/28]
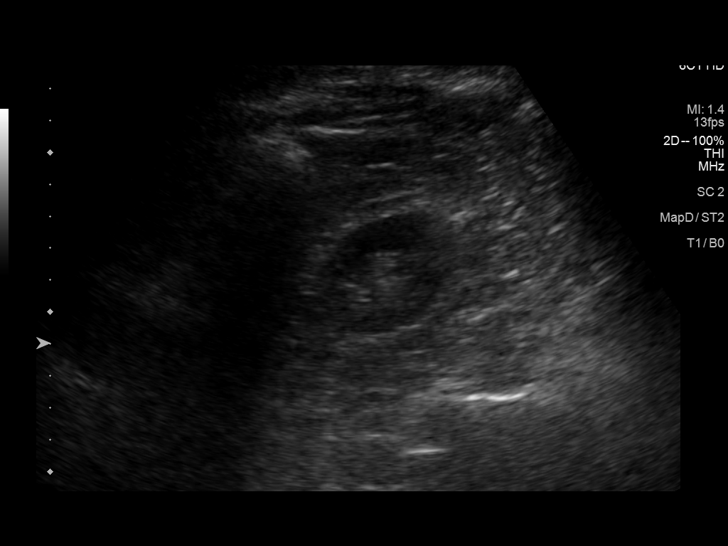
[im 23/28]
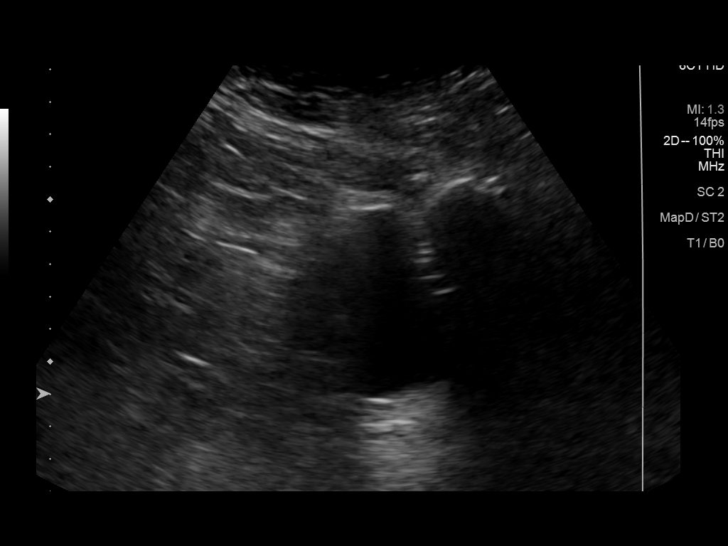
[im 25/28]
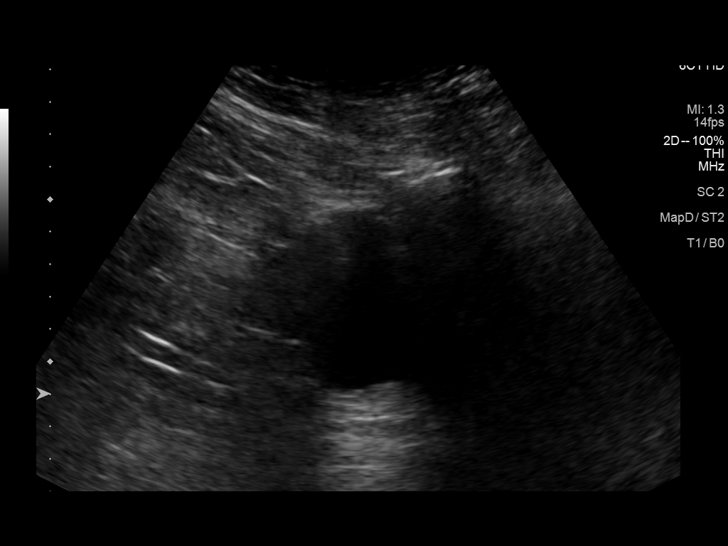
[im 28/28]
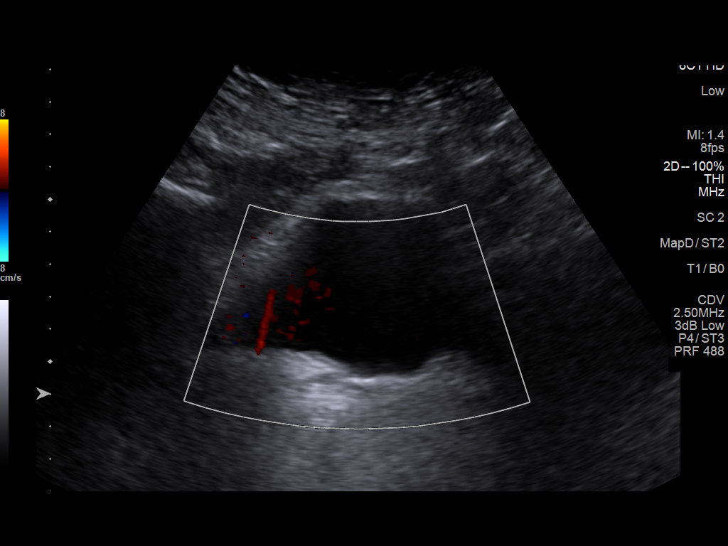

[14 of 25 positions shown; findings below may reference images not displayed]

FINDINGS: Right Kidney:

Renal measurements: 11.5 0.2 cm = volume: 104 mL. Echogenicity
within normal limits. No mass or hydronephrosis visualized.

Left Kidney:

Renal measurements: 10.4 x 4.6 x 5.5 cm = volume: 137 mL.
Echogenicity within normal limits. No mass or hydronephrosis
visualized.

Bladder:

Appears normal for degree of bladder distention.

Other:

None.
IMPRESSION: Normal exam.

## 2023-03-09 NOTE — Telephone Encounter (Signed)
 LVM x2 asking pt to call [employer] to give consent.

## 2023-03-10 ENCOUNTER — Telehealth: Payer: Self-pay | Admitting: Neurology

## 2023-03-10 ENCOUNTER — Other Ambulatory Visit: Payer: Self-pay | Admitting: Neurology

## 2023-03-10 NOTE — Telephone Encounter (Signed)
Vernona Rieger has called to provide delivery details of : delivery date tomorrow morning, SDV, 200 unit powder, 1 package, 90 days

## 2023-03-12 ENCOUNTER — Ambulatory Visit: Payer: 59 | Admitting: Neurology

## 2023-03-12 DIAGNOSIS — G43009 Migraine without aura, not intractable, without status migrainosus: Secondary | ICD-10-CM | POA: Diagnosis not present

## 2023-03-12 DIAGNOSIS — G43719 Chronic migraine without aura, intractable, without status migrainosus: Secondary | ICD-10-CM | POA: Diagnosis not present

## 2023-03-12 MED ORDER — ONABOTULINUMTOXINA 200 UNITS IJ SOLR
155.0000 [IU] | Freq: Once | INTRAMUSCULAR | Status: AC
  Administered 2023-03-12: 155 [IU] via INTRAMUSCULAR

## 2023-03-12 NOTE — Progress Notes (Signed)
Botox- 200 units x 1 vial Lot: [id number] Expiration: 04/2025 NDC: 6295-2841-32  Bacteriostatic 0.9% Sodium Chloride- * mL  Lot: [id number] Expiration: 11/28/2023 NDC: 7253-6644-03  Dx: G43.009 S/P Witnessed by April J RN

## 2023-03-12 NOTE — Progress Notes (Signed)
   BOTOX PROCEDURE NOTE FOR MIGRAINE HEADACHE  HISTORY: Here for Botox. Last was 12/16/22 with me. Reports continued with daily headache to left vertex, is constant. Has had 3 migraines in the last 3 months. Nurtec does help, takes 2-3 times a week. Has been anxious, nervous. Is taking topamax 100 mg at bedtime.   Description of procedure:  The patient was placed in a sitting position. The standard protocol was used for Botox as follows, with 5 units of Botox injected at each site:  -Procerus muscle, midline injection  -Corrugator muscle, bilateral injection  -Frontalis muscle, bilateral injection, with 2 sites each side, medial injection was performed in the upper one third of the frontalis muscle, in the region vertical from the medial inferior edge of the superior orbital rim. The lateral injection was again in the upper one third of the forehead vertically above the lateral limbus of the cornea, 1.5 cm lateral to the medial injection site.  -Temporalis muscle injection, 4 sites, bilaterally. The first injection was 3 cm above the tragus of the ear, second injection site was 1.5 cm to 3 cm up from the first injection site in line with the tragus of the ear. The third injection site was 1.5-3 cm forward between the first 2 injection sites. The fourth injection site was 1.5 cm posterior to the second injection site.  -Occipitalis muscle injection, 3 sites, bilaterally. The first injection was done one half way between the occipital protuberance and the tip of the mastoid process behind the ear. The second injection site was done lateral and superior to the first, 1 fingerbreadth from the first injection. The third injection site was 1 fingerbreadth superiorly and medially from the first injection site.  -Cervical paraspinal muscle injection, 2 sites, bilateral, the first injection site was 1 cm from the midline of the cervical spine, 3 cm inferior to the lower border of the occipital  protuberance. The second injection site was 1.5 cm superiorly and laterally to the first injection site.  -Trapezius muscle injection was performed at 3 sites, bilaterally. The first injection site was in the upper trapezius muscle halfway between the inflection point of the neck, and the acromion. The second injection site was one half way between the acromion and the first injection site. The third injection was done between the first injection site and the inflection point of the neck.   A 200 unit bottle of Botox was used, 155 units were injected, the rest of the Botox was wasted. The patient tolerated the procedure well, there were no complications of the above procedure.  Botox NDC [phone] Lot number [id number] Expiration date 04/2025 SP  She will continue Topamax 100 mg at bedtime, Nurtec 75 mg tablet up to every other day.  We will continue Botox.  She has had daily headache for the last 3 months.  If she continues with frequent headache I asked her to call to schedule an office visit to discuss other treatment options.

## 2023-03-20 ENCOUNTER — Other Ambulatory Visit: Payer: Self-pay | Admitting: Family Medicine

## 2023-03-23 NOTE — Telephone Encounter (Signed)
 Pt is due for her CPE (labs prior), please schedule and then route back to me to refill, thanks

## 2023-03-23 NOTE — Telephone Encounter (Signed)
 lvm for pt to call office to schedule appt cpe/labs

## 2023-03-24 NOTE — Telephone Encounter (Signed)
 lvm for pt to call office to schedule appt.

## 2023-03-24 NOTE — Telephone Encounter (Signed)
 Lvmtcb, sent mychart message

## 2023-03-25 NOTE — Telephone Encounter (Signed)
 lvm for pt to call office to schedule appt.

## 2023-04-01 NOTE — Assessment & Plan Note (Signed)
 pT1b N0 M0 stage IA invasive ductal carcinoma of right breast, grade II, ER/PR strongly positive, HER-2 negative, Oncotype RS 21 -diagnosed in 12/2013. She is s/p right breast lumpectomy AND adjuvant breast radiation. She declined adjuvant antiestrogen therapy, due to the concern of side effects. -on surveillance

## 2023-04-02 ENCOUNTER — Other Ambulatory Visit: Payer: Self-pay

## 2023-04-02 ENCOUNTER — Other Ambulatory Visit: Payer: 59

## 2023-04-02 ENCOUNTER — Ambulatory Visit: Payer: 59 | Admitting: Nurse Practitioner

## 2023-04-02 ENCOUNTER — Inpatient Hospital Stay: Payer: 59 | Admitting: Hematology

## 2023-04-02 ENCOUNTER — Inpatient Hospital Stay: Payer: 59 | Attending: Hematology

## 2023-04-02 VITALS — BP 130/62 | HR 68 | Temp 97.2°F | Resp 20 | Ht 66.0 in | Wt 189.1 lb

## 2023-04-02 DIAGNOSIS — Z9049 Acquired absence of other specified parts of digestive tract: Secondary | ICD-10-CM | POA: Diagnosis not present

## 2023-04-02 DIAGNOSIS — Z79899 Other long term (current) drug therapy: Secondary | ICD-10-CM | POA: Insufficient documentation

## 2023-04-02 DIAGNOSIS — Z170 Estrogen receptor positive status [ER+]: Secondary | ICD-10-CM

## 2023-04-02 DIAGNOSIS — Z853 Personal history of malignant neoplasm of breast: Secondary | ICD-10-CM | POA: Diagnosis not present

## 2023-04-02 DIAGNOSIS — E559 Vitamin D deficiency, unspecified: Secondary | ICD-10-CM

## 2023-04-02 DIAGNOSIS — K219 Gastro-esophageal reflux disease without esophagitis: Secondary | ICD-10-CM | POA: Insufficient documentation

## 2023-04-02 DIAGNOSIS — C50411 Malignant neoplasm of upper-outer quadrant of right female breast: Secondary | ICD-10-CM | POA: Insufficient documentation

## 2023-04-02 DIAGNOSIS — R079 Chest pain, unspecified: Secondary | ICD-10-CM | POA: Insufficient documentation

## 2023-04-02 DIAGNOSIS — N183 Chronic kidney disease, stage 3 unspecified: Secondary | ICD-10-CM | POA: Diagnosis not present

## 2023-04-02 DIAGNOSIS — G43909 Migraine, unspecified, not intractable, without status migrainosus: Secondary | ICD-10-CM | POA: Diagnosis not present

## 2023-04-02 DIAGNOSIS — Z87440 Personal history of urinary (tract) infections: Secondary | ICD-10-CM | POA: Insufficient documentation

## 2023-04-02 DIAGNOSIS — Z923 Personal history of irradiation: Secondary | ICD-10-CM | POA: Insufficient documentation

## 2023-04-02 LAB — CBC WITH DIFFERENTIAL (CANCER CENTER ONLY)
Abs Immature Granulocytes: 0.02 10*3/uL (ref 0.00–0.07)
Basophils Absolute: 0 10*3/uL (ref 0.0–0.1)
Basophils Relative: 0 %
Eosinophils Absolute: 0.1 10*3/uL (ref 0.0–0.5)
Eosinophils Relative: 2 %
HCT: 40.8 % (ref 36.0–46.0)
Hemoglobin: 13.8 g/dL (ref 12.0–15.0)
Immature Granulocytes: 0 %
Lymphocytes Relative: 29 %
Lymphs Abs: 1.9 10*3/uL (ref 0.7–4.0)
MCH: 30.5 pg (ref 26.0–34.0)
MCHC: 33.8 g/dL (ref 30.0–36.0)
MCV: 90.3 fL (ref 80.0–100.0)
Monocytes Absolute: 0.6 10*3/uL (ref 0.1–1.0)
Monocytes Relative: 9 %
Neutro Abs: 4 10*3/uL (ref 1.7–7.7)
Neutrophils Relative %: 60 %
Platelet Count: 195 10*3/uL (ref 150–400)
RBC: 4.52 MIL/uL (ref 3.87–5.11)
RDW: 12.9 % (ref 11.5–15.5)
WBC Count: 6.7 10*3/uL (ref 4.0–10.5)
nRBC: 0 % (ref 0.0–0.2)

## 2023-04-02 LAB — CMP (CANCER CENTER ONLY)
ALT: 26 U/L (ref 0–44)
AST: 30 U/L (ref 15–41)
Albumin: 4.7 g/dL (ref 3.5–5.0)
Alkaline Phosphatase: 54 U/L (ref 38–126)
Anion gap: 6 (ref 5–15)
BUN: 26 mg/dL — ABNORMAL HIGH (ref 8–23)
CO2: 26 mmol/L (ref 22–32)
Calcium: 9.9 mg/dL (ref 8.9–10.3)
Chloride: 107 mmol/L (ref 98–111)
Creatinine: 1.59 mg/dL — ABNORMAL HIGH (ref 0.44–1.00)
GFR, Estimated: 35 mL/min — ABNORMAL LOW (ref >60)
Glucose, Bld: 131 mg/dL — ABNORMAL HIGH (ref 70–99)
Potassium: 3.8 mmol/L (ref 3.5–5.1)
Sodium: 139 mmol/L (ref 135–145)
Total Bilirubin: 1.8 mg/dL — ABNORMAL HIGH (ref 0.0–1.2)
Total Protein: 7.8 g/dL (ref 6.5–8.1)

## 2023-04-02 LAB — VITAMIN D 25 HYDROXY (VIT D DEFICIENCY, FRACTURES): Vit D, 25-Hydroxy: 32.85 ng/mL (ref 30–100)

## 2023-04-02 NOTE — Progress Notes (Signed)
 [facility]   [phone] [phone]   Clinic Follow up Note   Patient Care Team: Tower, Audrie Gallus, MD as PCP - General Rollene Rotunda, MD as PCP - Cardiology (Cardiology) Rollene Rotunda, MD as Consulting Physician (Cardiology) Hubbard Hartshorn, NP (Inactive) as Nurse Practitioner (Nurse Practitioner) Harriette Bouillon, MD as Consulting Physician (General Surgery) Lurline Hare, MD as Consulting Physician (Radiation Oncology) Malachy Mood, MD as Consulting Physician (Hematology) Jene Every, MD as Consulting Physician (Orthopedic Surgery) Nils Flack NP as Nurse Practitioner (Urology)  Date of Service:  04/02/2023  CHIEF COMPLAINT: f/u of breast cancer  CURRENT THERAPY:  Surveillance  Oncology History   Breast cancer of upper-outer quadrant of right female breast (HCC) pT1b N0 M0 stage IA invasive ductal carcinoma of right breast, grade II, ER/PR strongly positive, HER-2 negative, Oncotype RS 21 -diagnosed in 12/2013. She is s/p right breast lumpectomy AND adjuvant breast radiation. She declined adjuvant antiestrogen therapy, due to the concern of side effects. -on surveillance    Assessment and Plan    Breast Cancer Diagnosed with stage 1 breast cancer in 2015, which was low risk. Underwent surgery and radiation therapy. Anti-estrogen therapy was attempted but not tolerated. It has been 10 years since the diagnosis, and she has been doing well without recurrence. The risk of late recurrence after ten years is very small. - Leave follow-up open; call if new symptoms arise - Consult primary care physician if new symptoms occur  Chest Pain Reports persistent, nagging pain under the breast, occurring two to three times a week for several years. Pain is not cardiac-related, as confirmed by a cardiologist. A CT scan in 2020 was negative. Pain may be related to gastroesophageal reflux disease (GERD), but she is not convinced. Persistent but  tolerable with positional adjustments. - Continue current management for GERD - Monitor symptoms and adjust positioning for comfort      Plan -She is clinically stable, no concern for cancer recurrence.  Will continue monitoring -She is almost 10 years out, risk of recurrence is low now.  I will see her as needed.  She will follow-up with her PCP and continue annual mammogram.   SUMMARY OF ONCOLOGIC HISTORY: Oncology History Overview Note  Cancer Staging Breast cancer of upper-outer quadrant of right female breast [facility]) Staging form: Breast, AJCC 7th Edition - Clinical stage from 01/04/2014: Stage IA (T1b, N0, M0) - Unsigned - Pathologic stage from 02/14/2014: Stage IA (T1b, N0, cM0) - Unsigned     Breast cancer of upper-outer quadrant of right female breast (HCC)  12/29/2013 Imaging   Screening mammogram and [place]: an irregular shadowing hypoechoic mass right breast 9 o'clock location 4 cm from the nipple measuring 7 x 7 x 7 mm. No adenopathy    12/29/2013 Initial Diagnosis   Breast cancer of upper-outer quadrant of right female breast   12/29/2013 Initial Biopsy   Grade I-II IDC, ER+ (98%), PR+ (81%), HER2- (ratio 1.24), Ki67 15%    01/11/2014 Procedure   Genetic counseling/testing: Revealed 1 VUS in ATM gene, p.D1641H (c.4921G>C).  Otherwise, genetic testing negative.    02/14/2014 Surgery   Right lumpectomy with SLNB ([facility]): Grade 2 IDC spanning 0.9 cm with associated grade 2 DCIS.  Negative margins. 5 sentinel lymph nodes negative.  HER2 repeated and neg. (ratio 1.16).   02/14/2014 Pathologic Stage   pT1bpN0; Stage IA   02/14/2014 Oncotype testing   Recurrence score 21 (13% risk of distant recurrence); no adjuvant chemo offered.  03/22/2014 - 04/19/2014 Radiation Therapy   Adjuvant radiation Michell Heinrich); Right breast: Total dose 42.72 Gy over 21 fractions. Right breast boost: Total dose 10 Gy over 5 fractions.     Anti-estrogen oral therapy   Anti-estrogen therapy was  recommended by Dr. Mosetta Putt; pt declines anti-estrogen treatment.     05/25/2014 Survivorship   Survivorship Care Plan given to patient and reviewed with her in-person.    05/30/2016 Imaging   US Abdomen 05/30/16 IMPRESSION: Status post cholecystectomy. Pancreas not visualized due to overlying bowel gas. Increased echogenicity of hepatic parenchyma is noted diffusely suggesting fatty infiltration or other diffuse hepatocellular disease.   07/02/2016 Imaging   DEXA 07/02/16 T score of -1.8, indicating osteopenia   12/15/2016 Mammogram   IMPRESSION: 1. No mammographic evidence of malignancy in either breast. 2. Stable right breast post lumpectomy changes.      Discussed the use of AI scribe software for clinical note transcription with the patient, who gave verbal consent to proceed.  History of Present Illness   The patient, with a history of stage one breast cancer diagnosed in 2015, presents for a routine follow-up. She reports persistent chest pain, located under the breast, which has been present for approximately five years. The pain is described as nagging and persistent, occurring two to three times a week. The patient has sought advice from her cardiologist and gastroenterologist, but no cause has been identified. The pain is not associated with any known triggers and does not completely resolve with positional changes. The patient has adjusted her lifestyle to manage the pain, which she describes as tolerable.  The patient also reports frequent migraines associated with nausea, for which she takes Zofran. She is on a regimen of multiple medications including metoprolol, Pepcid, Lasix, Topamax, Nitro, Gemtesa, potassium, vaginal estrogen cream, pantoprazole, statin, pyridium, propranolol, and Botox.         All other systems were reviewed with the patient and are negative.  MEDICAL HISTORY:  Past Medical History:  Diagnosis Date   Anxiety    Breast cancer (HCC)    ER+/PR+/Her2-    Concussion 05/26/14   Depression    Frequent UTI    GERD (gastroesophageal reflux disease)    Hypothyroid    Microscopic hematuria    Migraine    Personal history of radiation therapy    Radiation 03/22/14-04/19/14   Breast Cancer upper-outer   Restless leg syndrome    Seasonal allergies    Stented coronary artery    LAD 95% 2007.   Vitamin D deficiency     SURGICAL HISTORY: Past Surgical History:  Procedure Laterality Date   ABDOMINAL HYSTERECTOMY     APPENDECTOMY     back surgery  1997   herniated disc repair   BREAST LUMPECTOMY Right    2016   CARDIAC CATHETERIZATION  2007   stent   CHOLECYSTECTOMY OPEN     CYSTOSCOPY     neg   RADIOACTIVE SEED GUIDED PARTIAL MASTECTOMY WITH AXILLARY SENTINEL LYMPH NODE BIOPSY Right 02/14/2014   Procedure: RADIOACTIVE SEED GUIDED PARTIAL MASTECTOMY WITH AXILLARY SENTINEL LYMPH NODE BIOPSY;  Surgeon: Harriette Bouillon, MD;  Location: [facility];  Service: General;  Laterality: Right;   SHOULDER SURGERY Right     I have reviewed the social history and family history with the patient and they are unchanged from previous note.  ALLERGIES:  is allergic to iohexol.  MEDICATIONS:  Current Outpatient Medications  Medication Sig Dispense Refill   botulinum toxin Type A (BOTOX)  100 units SOLR injection Inject 155 units every 3 month, discard remaining. 2 each 3   CINNAMON PO Take 1,000 mg by mouth daily.      Coenzyme Q10 (CO Q-10) 200 MG CAPS Take 1 capsule by mouth daily.       estradiol (ESTRACE) 0.1 MG/GM vaginal cream Place 1 Applicatorful vaginally 2 (two) times a week.     famotidine (PEPCID) 20 MG tablet TAKE 2 TABLETS BY MOUTH TWICE  DAILY 400 tablet 0   fish oil-omega-3 fatty acids 1000 MG capsule Take 1.2 g by mouth 2 (two) times daily.     furosemide (LASIX) 20 MG tablet TAKE 1 TABLET BY MOUTH DAILY 100 tablet 0   GEMTESA 75 MG TABS Take 1 tablet by mouth daily.     Ginkgo Biloba 120 MG CAPS Take 1 capsule by mouth  daily.       KEFLEX 250 MG capsule      levothyroxine (SYNTHROID) 88 MCG tablet Take 88 mcg by mouth daily before breakfast.     liothyronine (CYTOMEL) 5 MCG tablet Take 5 mcg by mouth daily.     LIOTHYRONINE SODIUM PO 5 mg.     Magnesium 250 MG TABS Take 250 mg by mouth daily.     metoprolol tartrate (LOPRESSOR) 25 MG tablet TAKE 1 TABLET BY MOUTH DAILY  (NEED OFFICE VISIT) 7 tablet 0   Multiple Vitamin (MULTIVITAMIN) capsule Take 1 capsule by mouth daily.     nitroGLYCERIN (NITROSTAT) 0.4 MG SL tablet Place 1 tablet (0.4 mg total) under the tongue daily as needed for chest pain. 25 tablet 6   ondansetron (ZOFRAN) 4 MG tablet TAKE 1 TABLET BY MOUTH EVERY 8 HOURS AS NEEDED FOR NAUSEA FOR VOMITING 20 tablet 0   pantoprazole (PROTONIX) 40 MG tablet TAKE 1 TABLET BY MOUTH TWICE  DAILY 200 tablet 2   phenazopyridine (PYRIDIUM) 200 MG tablet Take 200 mg by mouth 2 (two) times daily.     potassium chloride (KLOR-CON M) 10 MEQ tablet Take 10 mEq by mouth daily.     Probiotic Product (PROBIOTIC-10 PO) Take by mouth.     propranolol ER (INDERAL LA) 60 MG 24 hr capsule TAKE 1 CAPSULE BY MOUTH ONCE  DAILY AT BEDTIME 100 capsule 2   Riboflavin 400 MG TABS Take 400 mg by mouth daily.     Rimegepant Sulfate (NURTEC) 75 MG TBDP DISSOLVE 1 TABLET ON TOP OF THE  TONGUE EVERY OTHER DAY (Patient taking differently: daily at 6 (six) AM. DISSOLVE 1 TABLET ON TOP OF THE  TONGUE EVERY OTHER DAY) 16 tablet 11   simvastatin (ZOCOR) 40 MG tablet TAKE 1 TABLET BY MOUTH AT  BEDTIME 100 tablet 2   topiramate (TOPAMAX) 100 MG tablet TAKE 1 TABLET BY MOUTH TWICE  DAILY 200 tablet 2   trimethoprim (TRIMPEX) 100 MG tablet Take 100 mg by mouth at bedtime.     Vibegron (GEMTESA PO) Take 1 tablet by mouth daily.     potassium chloride SA (KLOR-CON M) 20 MEQ tablet TAKE 2 TABLETS BY MOUTH TWICE  DAILY AND ONE-HALF TABLET BY  MOUTH DAILY AT BEDTIME (Patient not taking: Reported on 03/12/2023) 405 tablet 0   No current  facility-administered medications for this visit.    PHYSICAL EXAMINATION: ECOG PERFORMANCE STATUS: 0 - Asymptomatic  Vitals:   04/02/23 1317  BP: 130/62  Pulse: 68  Resp: 20  Temp: (!) 97.2 F (36.2 C)  SpO2: 99%   Wt Readings from Last 3  Encounters:  04/02/23 189 lb 1.6 oz (85.8 kg)  12/02/22 194 lb 9.6 oz (88.3 kg)  04/15/22 199 lb (90.3 kg)     GENERAL:alert, no distress and comfortable SKIN: skin color, texture, turgor are normal, no rashes or significant lesions EYES: normal, Conjunctiva are pink and non-injected, sclera clear NECK: supple, thyroid normal size, non-tender, without nodularity LYMPH:  no palpable lymphadenopathy in the cervical, axillary  LUNGS: clear to auscultation and percussion with normal breathing effort HEART: regular rate & rhythm and no murmurs and no lower extremity edema ABDOMEN:abdomen soft, non-tender and normal bowel sounds Musculoskeletal:no cyanosis of digits and no clubbing  NEURO: alert & oriented x 3 with fluent speech, no focal motor/sensory deficits BREAST: Right breast incision with scar tissue, non-tender. Left breast non-tender, no masses.      LABORATORY DATA:  I have reviewed the data as listed    Latest Ref Rng & Units 04/02/2023   12:57 PM 04/02/2022    9:44 AM 03/18/2022    3:42 PM  CBC  WBC 4.0 - 10.5 K/uL 6.7  6.3  6.3   Hemoglobin 12.0 - 15.0 g/dL 16.1  09.6  04.5   Hematocrit 36.0 - 46.0 % 40.8  36.7  39.0   Platelets 150 - 400 K/uL 195  182  192.0         Latest Ref Rng & Units 04/02/2023   12:57 PM 04/02/2022    9:44 AM 03/18/2022    3:42 PM  CMP  Glucose 70 - 99 mg/dL 409  811  914   BUN 8 - 23 mg/dL 26  29  17    Creatinine 0.44 - 1.00 mg/dL 7.82  9.56  2.13   Sodium 135 - 145 mmol/L 139  140  142   Potassium 3.5 - 5.1 mmol/L 3.8  4.2  4.4   Chloride 98 - 111 mmol/L 107  106  107   CO2 22 - 32 mmol/L 26  27  25    Calcium 8.9 - 10.3 mg/dL 9.9  08.6  57.8   Total Protein 6.5 - 8.1 g/dL 7.8  7.2  6.8   Total  Bilirubin 0.0 - 1.2 mg/dL 1.8  1.9  1.7   Alkaline Phos 38 - 126 U/L 54  49  50   AST 15 - 41 U/L 30  28  27    ALT 0 - 44 U/L 26  28  27        RADIOGRAPHIC STUDIES: I have personally reviewed the radiological images as listed and agreed with the findings in the report. No results found.    No orders of the defined types were placed in this encounter.  All questions were answered. The patient knows to call the clinic with any problems, questions or concerns. No barriers to learning was detected. The total time spent in the appointment was 20 minutes.     Malachy Mood, MD 04/02/2023

## 2023-04-03 ENCOUNTER — Other Ambulatory Visit: Payer: Self-pay | Admitting: Family Medicine

## 2023-04-03 ENCOUNTER — Encounter: Payer: Self-pay | Admitting: Hematology

## 2023-04-06 NOTE — Telephone Encounter (Signed)
 See refill from 03/20/23, multiple attempts have been made to get pt scheduled for her overdue CPE. Med declined until pt schedules appt

## 2023-04-14 ENCOUNTER — Other Ambulatory Visit: Payer: Self-pay | Admitting: Family Medicine

## 2023-04-14 ENCOUNTER — Other Ambulatory Visit: Payer: Self-pay | Admitting: Cardiology

## 2023-04-15 NOTE — Telephone Encounter (Signed)
 See refill from 03/20/23, multiple attempts have been made to get pt scheduled for her overdue CPE. Med declined until pt schedules appt

## 2023-04-21 DIAGNOSIS — E890 Postprocedural hypothyroidism: Secondary | ICD-10-CM | POA: Diagnosis not present

## 2023-04-25 ENCOUNTER — Other Ambulatory Visit: Payer: Self-pay | Admitting: Cardiology

## 2023-04-28 DIAGNOSIS — E890 Postprocedural hypothyroidism: Secondary | ICD-10-CM | POA: Diagnosis not present

## 2023-04-28 DIAGNOSIS — E559 Vitamin D deficiency, unspecified: Secondary | ICD-10-CM | POA: Diagnosis not present

## 2023-04-28 DIAGNOSIS — Z8739 Personal history of other diseases of the musculoskeletal system and connective tissue: Secondary | ICD-10-CM | POA: Diagnosis not present

## 2023-04-28 DIAGNOSIS — E785 Hyperlipidemia, unspecified: Secondary | ICD-10-CM | POA: Diagnosis not present

## 2023-05-01 ENCOUNTER — Other Ambulatory Visit: Payer: Self-pay | Admitting: Family Medicine

## 2023-05-07 DIAGNOSIS — N1832 Chronic kidney disease, stage 3b: Secondary | ICD-10-CM | POA: Diagnosis not present

## 2023-05-07 DIAGNOSIS — G43709 Chronic migraine without aura, not intractable, without status migrainosus: Secondary | ICD-10-CM | POA: Diagnosis not present

## 2023-05-07 DIAGNOSIS — E876 Hypokalemia: Secondary | ICD-10-CM | POA: Diagnosis not present

## 2023-05-10 ENCOUNTER — Other Ambulatory Visit: Payer: Self-pay | Admitting: Neurology

## 2023-05-12 ENCOUNTER — Telehealth: Payer: Self-pay | Admitting: Neurology

## 2023-05-12 NOTE — Telephone Encounter (Signed)
 Received fax of approval, pt will continue to fill through [facility]. Auth#: [id number] (05/12/23-08/11/23)

## 2023-05-12 NOTE — Telephone Encounter (Signed)
 Submitted auth renewal via CMM, status is pending. Key: [id number]

## 2023-05-29 NOTE — Telephone Encounter (Signed)
 Marlea Silvers from [facility] called re: delivery date of 5-6 SDV, 200 units quantity of 1 90 days call back # is [phone]

## 2023-06-01 NOTE — Telephone Encounter (Signed)
Updated appt note

## 2023-06-11 ENCOUNTER — Ambulatory Visit: Payer: 59 | Admitting: Neurology

## 2023-06-11 VITALS — BP 138/65 | HR 63

## 2023-06-11 DIAGNOSIS — G43719 Chronic migraine without aura, intractable, without status migrainosus: Secondary | ICD-10-CM

## 2023-06-11 DIAGNOSIS — G43009 Migraine without aura, not intractable, without status migrainosus: Secondary | ICD-10-CM

## 2023-06-11 MED ORDER — ONABOTULINUMTOXINA 200 UNITS IJ SOLR
155.0000 [IU] | Freq: Once | INTRAMUSCULAR | Status: AC
  Administered 2023-06-11: 155 [IU] via INTRAMUSCULAR

## 2023-06-11 NOTE — Progress Notes (Signed)
 Botox - 200 units x 1 vial Lot: [id number] Expiration: 10/2025 NDC: 1610-9604-54  Bacteriostatic 0.9% Sodium Chloride - 4 mL  Lot: [id number] Expiration: 11/28/2023 NDC: 1478-2956-21  Dx: G43.009 S/P  Witnessed by Mollie Anger, Kenneth Peace

## 2023-06-11 NOTE — Progress Notes (Signed)
   BOTOX  PROCEDURE NOTE FOR MIGRAINE HEADACHE  HISTORY: [name] [name] is here for Botox . Last was 03/12/23 with me. Headaches have improved, no longer deals. Can come in cycles of mild headaches then better days. Cold weather exacerbates migraines. Went back to taking Topamax  100 mg twice daily. Uses Nurtec up to every other day. This last cycle went 3 weeks without migraines!!   Description of procedure:  The patient was placed in a sitting position. The standard protocol was used for Botox  as follows, with 5 units of Botox  injected at each site:  -Procerus muscle, midline injection  -Corrugator muscle, bilateral injection  -Frontalis muscle, bilateral injection, with 2 sites each side, medial injection was performed in the upper one third of the frontalis muscle, in the region vertical from the medial inferior edge of the superior orbital rim. The lateral injection was again in the upper one third of the forehead vertically above the lateral limbus of the cornea, 1.5 cm lateral to the medial injection site.  -Temporalis muscle injection, 4 sites, bilaterally. The first injection was 3 cm above the tragus of the ear, second injection site was 1.5 cm to 3 cm up from the first injection site in line with the tragus of the ear. The third injection site was 1.5-3 cm forward between the first 2 injection sites. The fourth injection site was 1.5 cm posterior to the second injection site.  -Occipitalis muscle injection, 3 sites, bilaterally. The first injection was done one half way between the occipital protuberance and the tip of the mastoid process behind the ear. The second injection site was done lateral and superior to the first, 1 fingerbreadth from the first injection. The third injection site was 1 fingerbreadth superiorly and medially from the first injection site.  -Cervical paraspinal muscle injection, 2 sites, bilateral, the first injection site was 1 cm from the midline of the  cervical spine, 3 cm inferior to the lower border of the occipital protuberance. The second injection site was 1.5 cm superiorly and laterally to the first injection site.  -Trapezius muscle injection was performed at 3 sites, bilaterally. The first injection site was in the upper trapezius muscle halfway between the inflection point of the neck, and the acromion. The second injection site was one half way between the acromion and the first injection site. The third injection was done between the first injection site and the inflection point of the neck.  A 200 unit bottle of Botox  was used, 155 units were injected, the rest of the Botox  was wasted. The patient tolerated the procedure well, there were no complications of the above procedure.  Botox  NDC 1610-9604-54 Lot number [id number] Expiration date 10/2025 SP  Follow up at next open office visit for migraine management.

## 2023-06-13 ENCOUNTER — Other Ambulatory Visit: Payer: Self-pay | Admitting: Family Medicine

## 2023-07-30 ENCOUNTER — Other Ambulatory Visit: Payer: Self-pay | Admitting: Neurology

## 2023-08-02 ENCOUNTER — Other Ambulatory Visit: Payer: Self-pay | Admitting: Neurology

## 2023-08-13 ENCOUNTER — Encounter: Payer: Self-pay | Admitting: Urology

## 2023-08-13 DIAGNOSIS — N3020 Other chronic cystitis without hematuria: Secondary | ICD-10-CM | POA: Diagnosis not present

## 2023-08-13 DIAGNOSIS — N3281 Overactive bladder: Secondary | ICD-10-CM | POA: Diagnosis not present

## 2023-08-13 DIAGNOSIS — R300 Dysuria: Secondary | ICD-10-CM | POA: Diagnosis not present

## 2023-08-19 NOTE — Telephone Encounter (Signed)
 Completed auth renewal request via CMM, status is pending. Key: [id number]

## 2023-08-31 NOTE — Telephone Encounter (Signed)
 Received approval, pt will continue to fill through [facility]. Auth#: [id number] (08/19/22-11/19/23)

## 2023-09-01 ENCOUNTER — Ambulatory Visit (INDEPENDENT_AMBULATORY_CARE_PROVIDER_SITE_OTHER): Admitting: Neurology

## 2023-09-01 ENCOUNTER — Encounter: Payer: Self-pay | Admitting: Neurology

## 2023-09-01 DIAGNOSIS — Z8739 Personal history of other diseases of the musculoskeletal system and connective tissue: Secondary | ICD-10-CM | POA: Insufficient documentation

## 2023-09-01 DIAGNOSIS — G43719 Chronic migraine without aura, intractable, without status migrainosus: Secondary | ICD-10-CM

## 2023-09-01 DIAGNOSIS — G43009 Migraine without aura, not intractable, without status migrainosus: Secondary | ICD-10-CM

## 2023-09-01 MED ORDER — NURTEC 75 MG PO TBDP: ORAL_TABLET | ORAL | 11 refills | Status: AC

## 2023-09-01 MED ORDER — TOPIRAMATE 100 MG PO TABS: 100.0000 mg | ORAL_TABLET | Freq: Two times a day (BID) | ORAL | 3 refills | Status: DC

## 2023-09-01 NOTE — Progress Notes (Signed)
 PATIENT: [name] [name] DOB: [birth year]  REASON FOR VISIT: Follow up for migraine HISTORY FROM: Patient PRIMARY NEUROLOGIST: Dr. Chima/Dr. Onita   ASSESSMENT AND PLAN 68 y.o. year old female   1.  Intractable migraine headache  - Continue Botox  for chronic migraine headaches - Continue Nurtec 75 mg tablet up to every other day for migraine prevention, continue Topamax  100 mg twice a day - We discussed sleep study to rule out OSA, indicates had a study in the past that did not show any significant sleep apnea.  I cannot find the study in the record, she will look and let me know. Reports near daily morning headache  -In the past has tried and failed Effexor , Topamax , Inderal , nortriptyline, Imitrex , Maxalt, Aimovig , Ajovy , Emgality , Depakote , gabapentin , Ubrelvy  - Keep appointment for Botox   HISTORY OF PRESENT ILLNESS: Today 09/01/23 Update 09/01/23 SS: I do her Botox  every 3 months. His last cycle went 3 weeks without migraine. Most of the time, wakes up with pressure headache to left side of head. Reports few years ago had sleep study that was normal. Her son says he doesn't hear her snore as much anymore, she does get up during the night a few times to urinate. Cold places cause headache. Headache is left sided, almost starts crying, nauseated, vision can get blurry- these are from the cold. Has pressure headache, sensitive to noise when she wakes up. 2-3 times a week has headache. Remains on Topamax  100 mg BID. Takes Nurtec PRN, takes it every other day, which is when she needs it. It really helps quickly.   04/24/21 SS: Rudolph here today for follow-up. Takes Nurtec every other day. Lessens the intensity of headaches. Reduces the pain to a numb ache, it still hurts. Only 2 severe migraines since seeing Dr. Jenel. Doesn't like the numbing sensation she feels when she gets headache as result of Nurtec. Cool weather is a trigger. Takes Zofran , nauseated from Nurtec. Takes Aleve 3 days a  week. Has daily headache, mild to moderate intensity.   HISTORY 09/26/2020 Dr. Jenel: [name] is a 68 year old right-handed white female with a history of intractable migraine headache.  The patient has been on multiple medications in the past for her migraine.  She recently was placed on Ajovy , but only took an injection or 2 and stopped the medication because she said it made her slightly dizzy for 2 to 3 days after the injection.  She was on Ubrelvy  as a rescue drug which did help some, but she claims that Aleve does just as well.  She does have 4 or 5 days with headache a week, she may have 1 or 2 severe headaches a month that may be more incapacitating associated with nausea vomiting, photophobia and phonophobia.  Her headaches usually last all day long.  She returns to the office today for further evaluation.  Her headaches are usually worse in the cooler months of the year.  REVIEW OF SYSTEMS: Out of a complete 14 system review of symptoms, the patient complains only of the following symptoms, and all other reviewed systems are negative.  See HPI  ALLERGIES: Allergies  Allergen Reactions   Iohexol      Desc: throat selling resp distress hives.premedicate pt.entered 07/06/04, bsw.     HOME MEDICATIONS: Outpatient Medications Prior to Visit  Medication Sig Dispense Refill   botulinum toxin Type A  (BOTOX ) 200 units injection INJECT 200 UNITS INTRAMUSCULARLY AS DIRECTED 1 each 2   Cholecalciferol 1.25 MG (50000 UT)  TABS 2 tab Orally once a day for 30 days     CINNAMON PO Take 1,000 mg by mouth daily.      Coenzyme Q10 (CO Q-10) 200 MG CAPS Take 1 capsule by mouth daily.       estradiol  (ESTRACE ) 0.1 MG/GM vaginal cream Place 1 Applicatorful vaginally 2 (two) times a week.     famotidine  (PEPCID ) 20 MG tablet TAKE 2 TABLETS BY MOUTH TWICE  DAILY 400 tablet 0   fish oil-omega-3 fatty acids 1000 MG capsule Take 1.2 g by mouth 2 (two) times daily.     furosemide  (LASIX ) 20 MG tablet TAKE  1 TABLET BY MOUTH DAILY 100 tablet 0   GEMTESA 75 MG TABS Take 1 tablet by mouth daily.     Ginkgo Biloba 120 MG CAPS Take 1 capsule by mouth daily.       levothyroxine (SYNTHROID) 88 MCG tablet Take 88 mcg by mouth daily before breakfast.     liothyronine (CYTOMEL) 5 MCG tablet Take 5 mcg by mouth daily.     Magnesium  250 MG TABS Take 250 mg by mouth daily.     metoprolol  tartrate (LOPRESSOR ) 25 MG tablet TAKE 1 TABLET BY MOUTH DAILY  (NEED OFFICE VISIT) 7 tablet 0   Multiple Vitamin (MULTIVITAMIN) capsule Take 1 capsule by mouth daily.     ondansetron  (ZOFRAN ) 4 MG tablet TAKE 1 TABLET BY MOUTH EVERY 8 HOURS AS NEEDED FOR NAUSEA FOR VOMITING 20 tablet 0   pantoprazole  (PROTONIX ) 40 MG tablet TAKE 1 TABLET BY MOUTH TWICE  DAILY 200 tablet 2   phenazopyridine (PYRIDIUM) 200 MG tablet Take 200 mg by mouth 2 (two) times daily.     potassium chloride  (KLOR-CON  M) 10 MEQ tablet Take 10 mEq by mouth daily.     Probiotic Product (PROBIOTIC-10 PO) Take by mouth.     propranolol  ER (INDERAL  LA) 60 MG 24 hr capsule TAKE 1 CAPSULE BY MOUTH ONCE  DAILY AT BEDTIME 90 capsule 2   Riboflavin 400 MG TABS Take 400 mg by mouth daily.     Rimegepant Sulfate (NURTEC) 75 MG TBDP DISSOLVE 1 TABLET ON TOP OF THE  TONGUE EVERY OTHER DAY 16 tablet 11   simvastatin  (ZOCOR ) 40 MG tablet TAKE 1 TABLET BY MOUTH AT  BEDTIME 100 tablet 2   topiramate  (TOPAMAX ) 100 MG tablet TAKE 1 TABLET BY MOUTH TWICE  DAILY 200 tablet 2   trimethoprim (TRIMPEX) 100 MG tablet Take 100 mg by mouth at bedtime.     Vibegron (GEMTESA PO) Take 1 tablet by mouth daily.     botulinum toxin Type A  (BOTOX ) 100 units SOLR injection Inject 155 units every 3 month, discard remaining. (Patient not taking: Reported on 09/01/2023) 2 each 3   KEFLEX  250 MG capsule  (Patient not taking: Reported on 09/01/2023)     LIOTHYRONINE SODIUM PO 5 mg. (Patient not taking: Reported on 09/01/2023)     nitroGLYCERIN  (NITROSTAT ) 0.4 MG SL tablet Place 1 tablet (0.4 mg  total) under the tongue daily as needed for chest pain. (Patient not taking: Reported on 09/01/2023) 25 tablet 6   potassium chloride  SA (KLOR-CON  M) 20 MEQ tablet TAKE 2 TABLETS BY MOUTH TWICE  DAILY AND ONE-HALF TABLET BY  MOUTH DAILY AT BEDTIME (Patient not taking: Reported on 09/01/2023) 405 tablet 0   No facility-administered medications prior to visit.    PAST MEDICAL HISTORY: Past Medical History:  Diagnosis Date   Anxiety    Breast cancer (HCC)  ER+/PR+/Her2-   Concussion 05/26/14   Depression    Frequent UTI    GERD (gastroesophageal reflux disease)    Hypothyroid    Microscopic hematuria    Migraine    Personal history of radiation therapy    Radiation 03/22/14-04/19/14   Breast Cancer upper-outer   Restless leg syndrome    Seasonal allergies    Stented coronary artery    LAD 95% 2007.   Vitamin D  deficiency     PAST SURGICAL HISTORY: Past Surgical History:  Procedure Laterality Date   ABDOMINAL HYSTERECTOMY     APPENDECTOMY     back surgery  1997   herniated disc repair   BREAST LUMPECTOMY Right    2016   CARDIAC CATHETERIZATION  2007   stent   CHOLECYSTECTOMY OPEN     CYSTOSCOPY     neg   RADIOACTIVE SEED GUIDED PARTIAL MASTECTOMY WITH AXILLARY SENTINEL LYMPH NODE BIOPSY Right 02/14/2014   Procedure: RADIOACTIVE SEED GUIDED PARTIAL MASTECTOMY WITH AXILLARY SENTINEL LYMPH NODE BIOPSY;  Surgeon: Debby Shipper, MD;  Location: [facility];  Service: General;  Laterality: Right;   SHOULDER SURGERY Right     FAMILY HISTORY: Family History  Problem Relation Age of Onset   Coronary artery disease Mother    Thyroid  disease Mother    Alzheimer's disease Mother    Diabetes Mother    Heart disease Mother    Melanoma Brother    Ovarian cancer Paternal Aunt 14   Bone cancer Paternal Uncle    Alzheimer's disease Maternal Grandmother    Heart disease Maternal Grandfather    Pancreatic cancer Maternal Grandfather 52   Ovarian cancer Maternal Aunt         dx in 60s   Throat cancer Maternal Uncle        smoker   Lung cancer Maternal Uncle        heavy smoker   Ovarian cancer Paternal Aunt        dx in her 85s   Cancer Cousin        female cancer   Melanoma Father     SOCIAL HISTORY: Social History   Socioeconomic History   Marital status: Widowed    Spouse name: Not on file   Number of children: 1   Years of education: some coll.   Highest education level: Not on file  Occupational History   Occupation: not working  Tobacco Use   Smoking status: Never   Smokeless tobacco: Never  Vaping Use   Vaping status: Never Used  Substance and Sexual Activity   Alcohol use: No    Alcohol/week: 0.0 standard drinks of alcohol    Comment: socially   Drug use: No   Sexual activity: Not Currently  Other Topics Concern   Not on file  Social History Narrative   Does not get any regular exercise      Doctors   -uro-Ottlein   -cardio--hochrein   -Neurology--Dr. Jenel   -allergies--Dr. Tiana   Gyn past--Dr. Christophe   Couselor--Dr. Mitchum   Endo-- Dr. Tommas      Lives with son - not working only child   Patient is right handed.   Patient drinks 1-2 cups of caffeine daily.      Retired Investment banker, corporate work   Social Drivers of Community education officer: Not on [employer] Insecurity: Not on file  Transportation Needs: Not on file  Physical Activity: Not on file  Stress: Not on file  Social Connections: Not on file  Intimate Partner Violence: Not on file   PHYSICAL EXAM  Vitals:   09/01/23 1424  BP: 129/74  Pulse: 63  SpO2: 99%  Weight: 194 lb (88 kg)  Height: 5' 7 (1.702 m)   Body mass index is 30.38 kg/m.  Generalized: Well developed, in no acute distress  Neurological examination  Mentation: Alert oriented to time, place, history taking. Follows all commands speech and language fluent Cranial nerve II-XII: Pupils were equal round reactive to light. Extraocular movements were full, visual field were  full on confrontational test. Facial sensation and strength were normal. Head turning and shoulder shrug were normal and symmetric. Motor: The motor testing reveals 5 over 5 strength of all 4 extremities. Good symmetric motor tone is noted throughout.  Sensory: Sensory testing is intact to soft touch on all 4 extremities. No evidence of extinction is noted.  Coordination: Cerebellar testing reveals good finger-nose-finger and heel-to-shin bilaterally.  Gait and station: Gait is normal.   Reflexes: Deep tendon reflexes are symmetric and normal bilaterally.   DIAGNOSTIC DATA (LABS, IMAGING, TESTING) - I reviewed patient records, labs, notes, testing and imaging myself where available.  Lab Results  Component Value Date   WBC 6.7 04/02/2023   HGB 13.8 04/02/2023   HCT 40.8 04/02/2023   MCV 90.3 04/02/2023   PLT 195 04/02/2023      Component Value Date/Time   NA 139 04/02/2023 1257   NA 145 08/15/2016 1326   K 3.8 04/02/2023 1257   K 4.8 08/15/2016 1326   CL 107 04/02/2023 1257   CO2 26 04/02/2023 1257   CO2 22 08/15/2016 1326   GLUCOSE 131 (H) 04/02/2023 1257   GLUCOSE 129 08/15/2016 1326   BUN 26 (H) 04/02/2023 1257   BUN 15.4 08/15/2016 1326   CREATININE 1.59 (H) 04/02/2023 1257   CREATININE 1.20 (H) 06/26/2017 1531   CREATININE 1.5 (H) 08/15/2016 1326   CALCIUM 9.9 04/02/2023 1257   CALCIUM 10.4 08/15/2016 1326   PROT 7.8 04/02/2023 1257   PROT 7.0 10/29/2018 1604   PROT 7.5 08/15/2016 1326   ALBUMIN 4.7 04/02/2023 1257   ALBUMIN 4.7 10/29/2018 1604   ALBUMIN 4.2 08/15/2016 1326   AST 30 04/02/2023 1257   AST 28 08/15/2016 1326   ALT 26 04/02/2023 1257   ALT 27 08/15/2016 1326   ALKPHOS 54 04/02/2023 1257   ALKPHOS 58 08/15/2016 1326   BILITOT 1.8 (H) 04/02/2023 1257   BILITOT 1.62 (H) 08/15/2016 1326   GFRNONAA 35 (L) 04/02/2023 1257   GFRAA 42 (L) 09/12/2019 1228   Lab Results  Component Value Date   CHOL 128 03/18/2022   HDL 47.30 03/18/2022   LDLCALC 52  03/18/2022   LDLDIRECT 62.0 07/18/2020   TRIG 142.0 03/18/2022   CHOLHDL 3 03/18/2022   Lab Results  Component Value Date   HGBA1C 5.7 03/18/2022   Lab Results  Component Value Date   VITAMINB12 329 12/11/2016   Lab Results  Component Value Date   TSH 0.65 03/18/2022   Lauraine Gayland MANDES, DNP  [facility] [address] [place], [place] [address] [phone]

## 2023-09-01 NOTE — Patient Instructions (Signed)
 Continue current medications.  Try to find the records from your sleep study for my review.  We can discuss at next Botox . Thanks!!

## 2023-09-05 ENCOUNTER — Other Ambulatory Visit: Payer: Self-pay | Admitting: Neurology

## 2023-09-12 ENCOUNTER — Other Ambulatory Visit: Payer: Self-pay | Admitting: Neurology

## 2023-09-16 ENCOUNTER — Ambulatory Visit (INDEPENDENT_AMBULATORY_CARE_PROVIDER_SITE_OTHER): Admitting: Neurology

## 2023-09-16 VITALS — BP 126/68 | HR 65 | Ht 67.0 in | Wt 194.4 lb

## 2023-09-16 DIAGNOSIS — G43719 Chronic migraine without aura, intractable, without status migrainosus: Secondary | ICD-10-CM

## 2023-09-16 MED ORDER — ONABOTULINUMTOXINA 100 UNITS IJ SOLR
155.0000 [IU] | Freq: Once | INTRAMUSCULAR | Status: AC
  Administered 2023-09-16: 155 [IU] via INTRAMUSCULAR

## 2023-09-16 NOTE — Progress Notes (Signed)
 Botox - 200 units x 1 vial Lot: [id number] Expiration: 2028/03 NDC: 0023-3921-02  Bacteriostatic 0.9% Sodium Chloride - 4 mL  Lot: [id number] Expiration: 11/26/24 NDC: 95908033  Dx:  H56.280   G43.009  S/P Witnessed by DELENA MOLT

## 2023-09-16 NOTE — Progress Notes (Signed)
   BOTOX  PROCEDURE NOTE FOR MIGRAINE HEADACHE   HISTORY: [name] [name] is here for Botox . Last was 06/11/23 with me.  Brought me a 2010 sleep study that showed no OSA. Currently daily morning headache, snoring. I will order HST to rule out OSA contributing to headaches.  ESS 9.  Description of procedure:  The patient was placed in a sitting position. The standard protocol was used for Botox  as follows, with 5 units of Botox  injected at each site:   -Procerus muscle, midline injection  -Corrugator muscle, bilateral injection  -Frontalis muscle, bilateral injection, with 2 sites each side, medial injection was performed in the upper one third of the frontalis muscle, in the region vertical from the medial inferior edge of the superior orbital rim. The lateral injection was again in the upper one third of the forehead vertically above the lateral limbus of the cornea, 1.5 cm lateral to the medial injection site.  -Temporalis muscle injection, 4 sites, bilaterally. The first injection was 3 cm above the tragus of the ear, second injection site was 1.5 cm to 3 cm up from the first injection site in line with the tragus of the ear. The third injection site was 1.5-3 cm forward between the first 2 injection sites. The fourth injection site was 1.5 cm posterior to the second injection site.  -Occipitalis muscle injection, 3 sites, bilaterally. The first injection was done one half way between the occipital protuberance and the tip of the mastoid process behind the ear. The second injection site was done lateral and superior to the first, 1 fingerbreadth from the first injection. The third injection site was 1 fingerbreadth superiorly and medially from the first injection site.  -Cervical paraspinal muscle injection, 2 sites, bilateral, the first injection site was 1 cm from the midline of the cervical spine, 3 cm inferior to the lower border of the occipital protuberance. The second injection site  was 1.5 cm superiorly and laterally to the first injection site.  -Trapezius muscle injection was performed at 3 sites, bilaterally. The first injection site was in the upper trapezius muscle halfway between the inflection point of the neck, and the acromion. The second injection site was one half way between the acromion and the first injection site. The third injection was done between the first injection site and the inflection point of the neck.   A 200 unit bottle of Botox  was used, 155 units were injected, the rest of the Botox  was wasted. The patient tolerated the procedure well, there were no complications of the above procedure.  Botox  NDC 9976-6078-97 Lot number [id number] Expiration date 03/2026 SP

## 2023-09-25 ENCOUNTER — Ambulatory Visit (INDEPENDENT_AMBULATORY_CARE_PROVIDER_SITE_OTHER): Admitting: Neurology

## 2023-09-25 DIAGNOSIS — G4733 Obstructive sleep apnea (adult) (pediatric): Secondary | ICD-10-CM

## 2023-09-25 DIAGNOSIS — G43719 Chronic migraine without aura, intractable, without status migrainosus: Secondary | ICD-10-CM | POA: Diagnosis not present

## 2023-09-25 DIAGNOSIS — R0683 Snoring: Secondary | ICD-10-CM

## 2023-10-04 ENCOUNTER — Other Ambulatory Visit: Payer: Self-pay | Admitting: Cardiology

## 2023-10-13 NOTE — Progress Notes (Unsigned)
 [name] [name]

## 2023-10-14 NOTE — Procedures (Signed)
   [facility]  HOME SLEEP TEST (SANSA) REPORT (Mail-Out Device):   STUDY DATE: 10/05/2023  DOB: 06-11-1955  MRN: [id number]  ORDERING CLINICIAN: True Mar, MD, PhD   REFERRING CLINICIAN: Lauraine Born, NP  CLINICAL INFORMATION/HISTORY (obtained from visit note dated 09/01/2023): 68 year old female with an underlying medical history of migraine headaches, breast cancer, anxiety, depression, frequent UTIs, reflux disease, hypothyroidism, and mild obesity, who reports recurrent morning headaches.  There is clinical concern for underlying obstructive sleep apnea.   BMI (at the time of sleep clinic visit and/or test date): 30.4 kg/m  FINDINGS:   Study Protocol:    The SANSA single-point-of-skin-contact chest-worn sensor - an FDA cleared and DOT approved type 4 home sleep test device - measures eight physiological channels,  including blood oxygen saturation (measured via PPG [photoplethysmography]), EKG-derived heart rate, respiratory effort, chest movement (measured via accelerometer), snoring, body position, and actigraphy. The device is designed to be worn for up to 10 hours per study.   Sleep Summary:   Total Recording Time (hours, min): 9 hours, 28 min  Total Effective Sleep Time (hours, min):  6 hours, 34 min  Sleep Efficiency (%):    69%   Respiratory Indices:   Calculated sAHI (per hour):  0.5/hour         Oxygen Saturation Statistics:    Oxygen Saturation (%) Mean: 95.2%   Minimum oxygen saturation (%):                 81.9%   O2 Saturation Range (%): 81.9-98.3%   Time below or at 88% saturation: 0 min   Pulse Rate Statistics:   Pulse Mean (bpm):    57/min    Pulse Range (51-96/min)   Snoring: Mild, intermittent  IMPRESSION/DIAGNOSES:     Primary snoring    RECOMMENDATIONS:   This home sleep test does not demonstrate any significant obstructive or central sleep disordered breathing with a total AHI of less than 5/hour. Her total AHI  was 0.5/hour, O2 nadir of 81.9% without any significant time below 89% saturation for the night (less than 1 min).  Snoring was detected, intermittently in the mild range. Treatment with a positive airway pressure device such as AutoPap or CPAP is not indicated based on this test.   Other causes of the patient's symptoms, including circadian rhythm disturbances, an underlying mood disorder, medication effect and/or an underlying medical problem cannot be ruled out based on this test. Clinical correlation is recommended.  The patient should be cautioned not to drive, work at heights, or operate dangerous or heavy equipment when tired or sleepy. Review and reiteration of good sleep hygiene measures should be pursued with any patient. The patient will be advised to follow up with her referring provider, who will be notified of the test results.   I certify that I have reviewed the raw data recording prior to the issuance of this report in accordance with the standards of the American Academy of Sleep Medicine (AASM).    INTERPRETING PHYSICIAN:   True Mar, MD, PhD Medical Director, [facility]) Diplomat, ABPN (Neurology and Sleep)   [facility] [address], [address] [place], [place] [address] [phone]

## 2023-10-15 ENCOUNTER — Ambulatory Visit: Payer: Self-pay | Admitting: Neurology

## 2023-10-23 ENCOUNTER — Other Ambulatory Visit: Payer: Self-pay | Admitting: Cardiology

## 2023-10-23 ENCOUNTER — Other Ambulatory Visit: Payer: Self-pay | Admitting: Family Medicine

## 2023-11-03 DIAGNOSIS — N1832 Chronic kidney disease, stage 3b: Secondary | ICD-10-CM | POA: Diagnosis not present

## 2023-11-06 ENCOUNTER — Other Ambulatory Visit: Payer: Self-pay | Admitting: Neurology

## 2023-11-11 ENCOUNTER — Other Ambulatory Visit: Payer: Self-pay | Admitting: Family Medicine

## 2023-11-11 DIAGNOSIS — I10 Essential (primary) hypertension: Secondary | ICD-10-CM

## 2023-11-12 DIAGNOSIS — N1832 Chronic kidney disease, stage 3b: Secondary | ICD-10-CM | POA: Diagnosis not present

## 2023-11-12 DIAGNOSIS — G43709 Chronic migraine without aura, not intractable, without status migrainosus: Secondary | ICD-10-CM | POA: Diagnosis not present

## 2023-11-12 DIAGNOSIS — E876 Hypokalemia: Secondary | ICD-10-CM | POA: Diagnosis not present

## 2023-11-12 NOTE — Progress Notes (Signed)
 Update: ATM [id number] VUS reclassified as likely benign. Amended report date is 11/11/2023.

## 2023-11-17 NOTE — Telephone Encounter (Signed)
 Completed auth renewal request via CMM, status is pending. [name]

## 2023-12-01 ENCOUNTER — Ambulatory Visit: Admitting: Family Medicine

## 2023-12-01 ENCOUNTER — Encounter: Payer: Self-pay | Admitting: Family Medicine

## 2023-12-01 VITALS — BP 118/66 | HR 54 | Temp 98.0°F | Ht 65.75 in | Wt 193.1 lb

## 2023-12-01 DIAGNOSIS — Z0000 Encounter for general adult medical examination without abnormal findings: Secondary | ICD-10-CM

## 2023-12-01 DIAGNOSIS — E039 Hypothyroidism, unspecified: Secondary | ICD-10-CM | POA: Diagnosis not present

## 2023-12-01 DIAGNOSIS — K219 Gastro-esophageal reflux disease without esophagitis: Secondary | ICD-10-CM

## 2023-12-01 DIAGNOSIS — I10 Essential (primary) hypertension: Secondary | ICD-10-CM

## 2023-12-01 DIAGNOSIS — Z23 Encounter for immunization: Secondary | ICD-10-CM | POA: Diagnosis not present

## 2023-12-01 DIAGNOSIS — R7309 Other abnormal glucose: Secondary | ICD-10-CM

## 2023-12-01 DIAGNOSIS — E559 Vitamin D deficiency, unspecified: Secondary | ICD-10-CM

## 2023-12-01 DIAGNOSIS — E7800 Pure hypercholesterolemia, unspecified: Secondary | ICD-10-CM

## 2023-12-01 DIAGNOSIS — M85851 Other specified disorders of bone density and structure, right thigh: Secondary | ICD-10-CM

## 2023-12-01 DIAGNOSIS — N1832 Chronic kidney disease, stage 3b: Secondary | ICD-10-CM

## 2023-12-01 NOTE — Assessment & Plan Note (Signed)
 Under care of endocrinology No clinical changes  Taking both levothyroxine and lithyronine

## 2023-12-01 NOTE — Assessment & Plan Note (Signed)
 Under nephrology care  Complicated by chronic uti  On antibiotic now but not sure which one Avoiding nsaids Encouraged to stay hydrated

## 2023-12-01 NOTE — Assessment & Plan Note (Signed)
 Reviewed health habits including diet and exercise and skin cancer prevention Reviewed appropriate screening tests for age  Also reviewed health mt list, fam hx and immunization status , as well as social and family history   See HPI Labs reviewed and ordered Health Maintenance  Topic Date Due   Medicare Annual Wellness Visit  08/15/2021   Breast Cancer Screening  01/05/2024   COVID-19 Vaccine (4 - 2025-26 season) 12/16/2024*   Colon Cancer Screening  06/20/2030   DTaP/Tdap/Td vaccine (3 - Td or Tdap) 11/21/2030   Pneumococcal Vaccine for age over 36  Completed   Flu Shot  Completed   DEXA scan (bone density measurement)  Completed   Hepatitis C Screening  Completed   Zoster (Shingles) Vaccine  Completed   Meningitis B Vaccine  Aged Out  *Topic was postponed. The date shown is not the original due date.    Flu shot today  Plans to call and schedule her mammogram  Discussed fall prevention, supplements and exercise for bone density  Dexa due after march 2026 PHQ 2  no longer in therapy but doing ok

## 2023-12-01 NOTE — Assessment & Plan Note (Signed)
 bp in fair control at this time  BP Readings from Last 1 Encounters:  12/01/23 118/66   No changes needed Most recent labs reviewed  Disc lifstyle change with low sodium diet and exercise   Continues lasix  20 mg daily  Metoprolol  25 mg daily  Propranolol  ER 60 mg qhs  From cardiology  Still struggles with headaches

## 2023-12-01 NOTE — Assessment & Plan Note (Signed)
 A1c ordered   disc imp of low glycemic diet and wt loss to prevent DM2

## 2023-12-01 NOTE — Assessment & Plan Note (Signed)
 Disc goals for lipids and reasons to control them Rev last labs with pt Rev low sat fat diet in detail Labs today  Simvastatin  40 mg daily

## 2023-12-01 NOTE — Assessment & Plan Note (Signed)
 Dexa 03/2022   One fall No fractures Discussed fall prevention, supplements and exercise for bone density

## 2023-12-01 NOTE — Assessment & Plan Note (Signed)
 Last vitamin D  Lab Results  Component Value Date   VD25OH 32.85 04/02/2023   Encouraged to continue supplement unless otherwise specified by nephrology   Important to bone and overall health

## 2023-12-01 NOTE — Assessment & Plan Note (Signed)
 Continues protonix  and pepcid   Encouraged to avoid triggers  Lab Results  Component Value Date   VITAMINB12 329 12/11/2016

## 2023-12-01 NOTE — Progress Notes (Signed)
 Subjective:    Patient ID: [name] [name], female    DOB: [birth year], 68 y.o.   MRN: [id number]  HPI  Here for health maintenance exam and to review chronic medical problems   Wt Readings from Last 3 Encounters:  12/01/23 193 lb 2 oz (87.6 kg)  09/16/23 194 lb 6.4 oz (88.2 kg)  09/01/23 194 lb (88 kg)   31.41 kg/m  Vitals:   12/01/23 1433  BP: 118/66  Pulse: (!) 54  Temp: 98 F (36.7 C)  SpO2: 100%    Immunization History  Administered Date(s) Administered   INFLUENZA, HIGH DOSE SEASONAL PF 11/20/2020, 12/01/2023   Influenza,inj,Quad PF,6+ Mos 11/15/2015, 11/11/2018   Janssen (J&J) SARS-COV-2 Vaccination 05/05/2019   Moderna Covid-19 Fall Seasonal Vaccine 66yrs & older 04/04/2022   Moderna Sars-Covid-2 Vaccination 01/23/2020   PNEUMOCOCCAL CONJUGATE-20 08/15/2020   Respiratory Syncytial Virus Vaccine,Recomb Aduvanted(Arexvy) 04/04/2022   Td 01/27/2006   Tdap 11/20/2020   Zoster Recombinant(Shingrix) 11/20/2020, 04/24/2021    Health Maintenance Due  Topic Date Due   Medicare Annual Wellness (AWV)  08/15/2021   Mammogram  01/05/2024   Flu shot-today  Mammogram 12/2022 -has it this month / will call to schedule  Self breast exam-no lumps  Personal history of breast cancer sees onc   Gyn health Hysterectomy   Sees urology for chronic uti  Has been on multiple antibiotics  Watching renal numbers carefully    Colon cancer screening  Colonoscopy 05/2020   Bone health  Dexa  03/2022  Osteopenia  Falls-one /no injury (getting into cabinet from step ladder) -was very sore  Fractures-none  Supplements ? Vitamin D   Last vitamin D  Lab Results  Component Value Date   VD25OH 32.85 04/02/2023    Exercise  None regular but tries Does loves to walk  Has a pedaler   Mood    12/01/2023    2:41 PM 03/25/2022    3:47 PM 08/15/2020    4:06 PM 11/11/2018    1:05 PM 06/26/2017    2:48 PM  Depression screen PHQ 2/9  Decreased Interest 0 0 0 1 0  Down,  Depressed, Hopeless 0 0 0 1 0  PHQ - 2 Score 0 0 0 2 0  Altered sleeping 1 0  0 0  Tired, decreased energy 1 2  0 0  Change in appetite 0 0  0 0  Feeling bad or failure about yourself  0 0  0 0  Trouble concentrating 1 0  0 0  Moving slowly or fidgety/restless 0 0  0 0  Suicidal thoughts 0 0  0 0  PHQ-9 Score 3 2  2  0  Difficult doing work/chores Not difficult at all Somewhat difficult  Not difficult at all Not difficult at all   No longer sees counselor  Enjoys her cats   HTN bp is stable today  No cp or palpitations or headaches or edema  No side effects to medicines  BP Readings from Last 3 Encounters:  12/01/23 118/66  09/16/23 126/68  09/01/23 129/74    Cardiology care Lasix  20 mg daily Metoprolol  25 mg daily  Propranolol  LA 60 mg at bedtime   Pulse Readings from Last 3 Encounters:  12/01/23 (!) 54  09/16/23 65  09/01/23 63     CKD  Lab Results  Component Value Date   NA 139 04/02/2023   K 3.8 04/02/2023   CO2 26 04/02/2023   GLUCOSE 131 (H) 04/02/2023   BUN 26 (H) 04/02/2023  CREATININE 1.59 (H) 04/02/2023   CALCIUM 9.9 04/02/2023   GFR 48.73 (L) 03/18/2022   EGFR 37 (L) 08/15/2016   GFRNONAA 35 (L) 04/02/2023    GERD Protonix  40 mg bid  Pepcid  20 mg bid   Hypothyroid Under care of endocrinology  Levothyroxine and liothyronine   Glucose Lab Results  Component Value Date   HGBA1C 5.7 03/18/2022   HGBA1C 5.8 07/18/2020   HGBA1C 5.7 (H) 06/26/2017   Hyperlipideima  Lab Results  Component Value Date   CHOL 128 03/18/2022   HDL 47.30 03/18/2022   LDLCALC 52 03/18/2022   LDLDIRECT 62.0 07/18/2020   TRIG 142.0 03/18/2022   CHOLHDL 3 03/18/2022   Simvastatin  40 mg daily   Patient Active Problem List   Diagnosis Date Noted   History of osteopenia 09/01/2023   Stage 3b chronic kidney disease (HCC) 05/30/2021   Essential hypertension 05/30/2021   Precordial chest pain 11/19/2020   Elevated glucose 07/18/2020   Medicare annual wellness  visit, subsequent 11/11/2018   Hearing loss 11/11/2018   Coronary artery disease involving native coronary artery of native heart without angina pectoris 04/21/2017   Positive ANA (antinuclear antibody) 12/15/2016   Fatigue 12/11/2016   Estrogen deficiency 06/24/2016   Osteopenia 05/18/2016   Genetic testing 01/25/2014   Breast cancer of upper-outer quadrant of right female breast (HCC) 12/30/2013   Hypothyroid 12/10/2011   Adverse effects of medication 10/28/2010   Routine general medical examination at a health care facility 09/30/2010   PERIODIC LIMB MOVEMENT DISORDER 08/16/2008   HYPERSOMNIA 06/21/2008   Somnolence, daytime 06/07/2008   Vitamin D  deficiency 04/07/2008   Chronic UTI 11/05/2007   Renal insufficiency 09/16/2007   HEMATURIA, MICROSCOPIC, HX OF 09/16/2007   Hyperlipidemia 08/05/2007   DEPRESSIVE DISORDER 08/05/2007   Intractable chronic migraine without aura 08/05/2007   Coronary atherosclerosis 08/05/2007   GERD 08/05/2007   DEGENERATIVE DISC DISEASE, LUMBAR SPINE 08/05/2007   EDEMA 08/05/2007   Past Medical History:  Diagnosis Date   Allergy 1980   Anxiety    Breast cancer (HCC)    ER+/PR+/Her2-   Cataract 2022   Chronic kidney disease 2013   Concussion 05/26/2014   Depression 1977   Frequent UTI    GERD (gastroesophageal reflux disease)    Hypothyroid    Microscopic hematuria    Migraine    Personal history of radiation therapy    Radiation 03/22/14-04/19/14   Breast Cancer upper-outer   Restless leg syndrome    Seasonal allergies    Stented coronary artery    LAD 95% 2007.   Vitamin D  deficiency    Past Surgical History:  Procedure Laterality Date   ABDOMINAL HYSTERECTOMY  1985   APPENDECTOMY  1981   back surgery  1997   herniated disc repair   BREAST LUMPECTOMY Right    2016   CARDIAC CATHETERIZATION  2007   stent   CHOLECYSTECTOMY OPEN     CYSTOSCOPY     neg   EYE SURGERY  2022   RADIOACTIVE SEED GUIDED PARTIAL MASTECTOMY WITH  AXILLARY SENTINEL LYMPH NODE BIOPSY Right 02/14/2014   Procedure: RADIOACTIVE SEED GUIDED PARTIAL MASTECTOMY WITH AXILLARY SENTINEL LYMPH NODE BIOPSY;  Surgeon: Debby Shipper, MD;  Location: [facility];  Service: General;  Laterality: Right;   SHOULDER SURGERY Right    SPINE SURGERY  1997   Social History   Tobacco Use   Smoking status: Never   Smokeless tobacco: Never  Vaping Use   Vaping status: Never Used  Substance Use Topics   Alcohol use: No    Alcohol/week: 0.0 standard drinks of alcohol    Comment: socially   Drug use: No   Family History  Problem Relation Age of Onset   Coronary artery disease Mother    Thyroid  disease Mother    Alzheimer's disease Mother    Diabetes Mother    Heart disease Mother    Hearing loss Mother    Obesity Mother    Melanoma Brother    Hearing loss Brother    Ovarian cancer Paternal Aunt 54   Cancer Paternal Aunt    Early death Paternal Aunt    Bone cancer Paternal Uncle    Cancer Paternal Uncle    Alzheimer's disease Maternal Grandmother    Diabetes Maternal Grandmother    Hearing loss Maternal Grandmother    Obesity Maternal Grandmother    Heart disease Maternal Grandfather    Pancreatic cancer Maternal Grandfather 33   Ovarian cancer Maternal Aunt        dx in 60s   Throat cancer Maternal Uncle        smoker   Cancer Maternal Uncle    Heart disease Maternal Uncle    Lung cancer Maternal Uncle        heavy smoker   Cancer Maternal Uncle    Obesity Maternal Uncle    Ovarian cancer Paternal Aunt        dx in her 43s   Cancer Paternal Aunt    COPD Paternal Aunt    Cancer Cousin        female cancer   Melanoma Father    Cancer Father    Early death Paternal Grandmother    Allergies  Allergen Reactions   Iohexol      Desc: throat selling resp distress hives.premedicate pt.entered 07/06/04, bsw.    Current Outpatient Medications on File Prior to Visit  Medication Sig Dispense Refill   botulinum toxin  Type A (BOTOX ) 100 units SOLR injection Inject 155 units every 3 month, discard remaining. 2 each 3   botulinum toxin Type A  (BOTOX ) 200 units injection INJECT 200 UNITS INTRAMUSCULARLY AS DIRECTED 1 each 2   CINNAMON PO Take 1,000 mg by mouth daily.      Coenzyme Q10 (CO Q-10) 200 MG CAPS Take 1 capsule by mouth daily.       estradiol  (ESTRACE ) 0.1 MG/GM vaginal cream Place 1 Applicatorful vaginally 2 (two) times a week.     famotidine  (PEPCID ) 20 MG tablet TAKE 2 TABLETS BY MOUTH TWICE  DAILY 30 tablet 0   furosemide  (LASIX ) 20 MG tablet TAKE 1 TABLET BY MOUTH DAILY 30 tablet 11   GEMTESA 75 MG TABS Take 1 tablet by mouth daily.     Ginkgo Biloba 120 MG CAPS Take 1 capsule by mouth daily.       levothyroxine (SYNTHROID) 88 MCG tablet Take 88 mcg by mouth daily before breakfast.     liothyronine (CYTOMEL) 5 MCG tablet Take 5 mcg by mouth daily.     Magnesium  250 MG TABS Take 250 mg by mouth daily.     metoprolol  tartrate (LOPRESSOR ) 25 MG tablet Take 1 tablet (25 mg total) by mouth daily at 12 noon. 90 tablet 0   Multiple Vitamin (MULTIVITAMIN) capsule Take 1 capsule by mouth daily.     nitroGLYCERIN  (NITROSTAT ) 0.4 MG SL tablet Place 1 tablet (0.4 mg total) under the tongue daily as needed for chest pain. (Patient not taking: Reported on 09/16/2023)  25 tablet 6   ondansetron  (ZOFRAN ) 4 MG tablet TAKE 1 TABLET BY MOUTH EVERY 8 HOURS AS NEEDED FOR NAUSEA FOR VOMITING 20 tablet 11   pantoprazole  (PROTONIX ) 40 MG tablet TAKE 1 TABLET BY MOUTH TWICE  DAILY 200 tablet 2   phenazopyridine (PYRIDIUM) 200 MG tablet Take 200 mg by mouth 2 (two) times daily.     potassium chloride  (KLOR-CON  M) 10 MEQ tablet Take 10 mEq by mouth daily.     propranolol  ER (INDERAL  LA) 60 MG 24 hr capsule TAKE 1 CAPSULE BY MOUTH ONCE  DAILY AT BEDTIME 100 capsule 0   Riboflavin 400 MG TABS Take 400 mg by mouth daily.     Rimegepant Sulfate (NURTEC) 75 MG TBDP DISSOLVE 1 TABLET ON TOP OF THE  TONGUE EVERY OTHER DAY 16 tablet  11   simvastatin  (ZOCOR ) 40 MG tablet TAKE 1 TABLET BY MOUTH AT  BEDTIME 100 tablet 2   topiramate  (TOPAMAX ) 100 MG tablet TAKE 1 TABLET BY MOUTH TWICE  DAILY 180 tablet 3   trimethoprim (TRIMPEX) 100 MG tablet Take 100 mg by mouth at bedtime.     No current facility-administered medications on file prior to visit.    Review of Systems  Constitutional:  Positive for fatigue. Negative for activity change, appetite change, fever and unexpected weight change.  HENT:  Negative for congestion, ear pain, rhinorrhea, sinus pressure and sore throat.   Eyes:  Negative for pain, redness and visual disturbance.  Respiratory:  Negative for cough, shortness of breath and wheezing.   Cardiovascular:  Negative for chest pain and palpitations.  Gastrointestinal:  Negative for abdominal pain, blood in stool, constipation and diarrhea.  Endocrine: Negative for polydipsia and polyuria.  Genitourinary:  Negative for dysuria, frequency and urgency.       Chronic uti   Musculoskeletal:  Positive for arthralgias and myalgias. Negative for back pain.  Skin:  Negative for pallor and rash.  Allergic/Immunologic: Negative for environmental allergies.  Neurological:  Negative for dizziness, syncope and headaches.  Hematological:  Negative for adenopathy. Does not bruise/bleed easily.  Psychiatric/Behavioral:  Negative for decreased concentration and dysphoric mood. The patient is not nervous/anxious.        Objective:   Physical Exam Constitutional:      General: She is not in acute distress.    Appearance: Normal appearance. She is well-developed. She is obese. She is not ill-appearing or diaphoretic.  HENT:     Head: Normocephalic and atraumatic.     Right Ear: Tympanic membrane, ear canal and external ear normal.     Left Ear: Tympanic membrane, ear canal and external ear normal.     Nose: Nose normal. No congestion.     Mouth/Throat:     Mouth: Mucous membranes are moist.     Pharynx: Oropharynx is  clear. No posterior oropharyngeal erythema.  Eyes:     General: No scleral icterus.    Extraocular Movements: Extraocular movements intact.     Conjunctiva/sclera: Conjunctivae normal.     Pupils: Pupils are equal, round, and reactive to light.  Neck:     Thyroid : No thyromegaly.     Vascular: No carotid bruit or JVD.  Cardiovascular:     Rate and Rhythm: Normal rate and regular rhythm.     Pulses: Normal pulses.     Heart sounds: Normal heart sounds.     No gallop.  Pulmonary:     Effort: Pulmonary effort is normal. No respiratory distress.  Breath sounds: Normal breath sounds. No wheezing.     Comments: Good air exch Chest:     Chest wall: No tenderness.  Abdominal:     General: Bowel sounds are normal. There is no distension or abdominal bruit.     Palpations: Abdomen is soft. There is no mass.     Tenderness: There is no abdominal tenderness.     Hernia: No hernia is present.  Musculoskeletal:        General: No tenderness. Normal range of motion.     Cervical back: Normal range of motion and neck supple. No rigidity. No muscular tenderness.     Right lower leg: No edema.     Left lower leg: No edema.     Comments: No kyphosis   Lymphadenopathy:     Cervical: No cervical adenopathy.  Skin:    General: Skin is warm and dry.     Coloration: Skin is not pale.     Findings: No erythema or rash.     Comments: Solar lentigines diffusely   Neurological:     Mental Status: She is alert. Mental status is at baseline.     Cranial Nerves: No cranial nerve deficit.     Motor: No abnormal muscle tone.     Coordination: Coordination normal.     Gait: Gait normal.     Deep Tendon Reflexes: Reflexes are normal and symmetric. Reflexes normal.  Psychiatric:        Mood and Affect: Mood normal.        Cognition and Memory: Cognition and memory normal.           Assessment & Plan:   Problem List Items Addressed This Visit       Cardiovascular and Mediastinum    Essential hypertension   bp in fair control at this time  BP Readings from Last 1 Encounters:  12/01/23 118/66   No changes needed Most recent labs reviewed  Disc lifstyle change with low sodium diet and exercise   Continues lasix  20 mg daily  Metoprolol  25 mg daily  Propranolol  ER 60 mg qhs  From cardiology  Still struggles with headaches       Relevant Orders   CBC with Differential/Platelet   Comprehensive metabolic panel with GFR   Lipid Panel     Digestive   GERD   Continues protonix  and pepcid   Encouraged to avoid triggers  Lab Results  Component Value Date   VITAMINB12 329 12/11/2016            Endocrine   Hypothyroid   Under care of endocrinology No clinical changes  Taking both levothyroxine and lithyronine         Musculoskeletal and Integument   Osteopenia   Dexa 03/2022   One fall No fractures Discussed fall prevention, supplements and exercise for bone density          Genitourinary   Stage 3b chronic kidney disease (HCC)   Under nephrology care  Complicated by chronic uti  On antibiotic now but not sure which one Avoiding nsaids Encouraged to stay hydrated         Other   Vitamin D  deficiency (Chronic)   Last vitamin D  Lab Results  Component Value Date   VD25OH 32.85 04/02/2023   Encouraged to continue supplement unless otherwise specified by nephrology   Important to bone and overall health       Routine general medical examination at a health care facility - Primary  Reviewed health habits including diet and exercise and skin cancer prevention Reviewed appropriate screening tests for age  Also reviewed health mt list, fam hx and immunization status , as well as social and family history   See HPI Labs reviewed and ordered Health Maintenance  Topic Date Due   Medicare Annual Wellness Visit  08/15/2021   Breast Cancer Screening  01/05/2024   COVID-19 Vaccine (4 - 2025-26 season) 12/16/2024*   Colon Cancer Screening   06/20/2030   DTaP/Tdap/Td vaccine (3 - Td or Tdap) 11/21/2030   Pneumococcal Vaccine for age over 40  Completed   Flu Shot  Completed   DEXA scan (bone density measurement)  Completed   Hepatitis C Screening  Completed   Zoster (Shingles) Vaccine  Completed   Meningitis B Vaccine  Aged Out  *Topic was postponed. The date shown is not the original due date.    Flu shot today  Plans to call and schedule her mammogram  Discussed fall prevention, supplements and exercise for bone density  Dexa due after march 2026 PHQ 2  no longer in therapy but doing ok       Hyperlipidemia   Disc goals for lipids and reasons to control them Rev last labs with pt Rev low sat fat diet in detail Labs today  Simvastatin  40 mg daily       Relevant Orders   Lipid Panel   Elevated glucose   A1c ordered  disc imp of low glycemic diet and wt loss to prevent DM2       Relevant Orders   Hemoglobin A1c   Other Visit Diagnoses       Need for influenza vaccination       Relevant Orders   Flu vaccine HIGH DOSE PF(Fluzone Trivalent) (Completed)

## 2023-12-01 NOTE — Patient Instructions (Addendum)
 Don't forget to schedule your mammogram   Stay as active as you can be when you can tolerate it  The pedaler is great  Add some strength training to your routine, this is important for bone and brain health and can reduce your risk of falls and help your body use insulin properly and regulate weight  Light weights, exercise bands , and internet videos are a good way to start  Yoga (chair or regular), machines , floor exercises or a gym with machines are also good options   Water exercise is great if you can do it   Flu shot today   Labs today   Avoid added sugars in your diet when you can  Try to get most of your carbohydrates from produce (with the exception of white potatoes) and whole grains Eat less bread/pasta/rice/snack foods/cereals/sweets and other items from the middle of the grocery store (processed carbs)

## 2023-12-02 ENCOUNTER — Ambulatory Visit: Payer: Self-pay | Admitting: Family Medicine

## 2023-12-02 LAB — LIPID PANEL
Cholesterol: 121 mg/dL (ref 0–200)
HDL: 49.3 mg/dL (ref >39.00)
LDL Cholesterol: 44 mg/dL (ref 0–99)
NonHDL: 71.84
Total CHOL/HDL Ratio: 2
Triglycerides: 141 mg/dL (ref 0.0–149.0)
VLDL: 28.2 mg/dL (ref 0.0–40.0)

## 2023-12-02 LAB — CBC WITH DIFFERENTIAL/PLATELET
Basophils Absolute: 0 K/uL (ref 0.0–0.1)
Basophils Relative: 0.8 % (ref 0.0–3.0)
Eosinophils Absolute: 0.2 K/uL (ref 0.0–0.7)
Eosinophils Relative: 3.6 % (ref 0.0–5.0)
HCT: 38.7 % (ref 36.0–46.0)
Hemoglobin: 13.1 g/dL (ref 12.0–15.0)
Lymphocytes Relative: 26.2 % (ref 12.0–46.0)
Lymphs Abs: 1.6 K/uL (ref 0.7–4.0)
MCHC: 33.8 g/dL (ref 30.0–36.0)
MCV: 89.9 fl (ref 78.0–100.0)
Monocytes Absolute: 0.6 K/uL (ref 0.1–1.0)
Monocytes Relative: 9.3 % (ref 3.0–12.0)
Neutro Abs: 3.8 K/uL (ref 1.4–7.7)
Neutrophils Relative %: 60.1 % (ref 43.0–77.0)
Platelets: 201 K/uL (ref 150.0–400.0)
RBC: 4.3 Mil/uL (ref 3.87–5.11)
RDW: 14.1 % (ref 11.5–15.5)
WBC: 6.3 K/uL (ref 4.0–10.5)

## 2023-12-02 LAB — COMPREHENSIVE METABOLIC PANEL WITH GFR
ALT: 28 U/L (ref 0–35)
AST: 25 U/L (ref 0–37)
Albumin: 4.4 g/dL (ref 3.5–5.2)
Alkaline Phosphatase: 41 U/L (ref 39–117)
BUN: 23 mg/dL (ref 6–23)
CO2: 25 meq/L (ref 19–32)
Calcium: 9.8 mg/dL (ref 8.4–10.5)
Chloride: 107 meq/L (ref 96–112)
Creatinine, Ser: 1.44 mg/dL — ABNORMAL HIGH (ref 0.40–1.20)
GFR: 37.15 mL/min — ABNORMAL LOW (ref >60.00)
Glucose, Bld: 123 mg/dL — ABNORMAL HIGH (ref 70–99)
Potassium: 3.7 meq/L (ref 3.5–5.1)
Sodium: 142 meq/L (ref 135–145)
Total Bilirubin: 2 mg/dL — ABNORMAL HIGH (ref 0.2–1.2)
Total Protein: 7.2 g/dL (ref 6.0–8.3)

## 2023-12-02 LAB — HEMOGLOBIN A1C: Hgb A1c MFr Bld: 6 % (ref 4.6–6.5)

## 2023-12-03 NOTE — Telephone Encounter (Signed)
 Auth was approved, she will continue to fill through [facility].  Auth#: [id number] (11/17/23-02/17/24)

## 2023-12-04 ENCOUNTER — Other Ambulatory Visit: Payer: Self-pay | Admitting: Hematology

## 2023-12-04 DIAGNOSIS — Z1231 Encounter for screening mammogram for malignant neoplasm of breast: Secondary | ICD-10-CM

## 2023-12-15 ENCOUNTER — Ambulatory Visit: Admitting: Neurology

## 2023-12-15 ENCOUNTER — Encounter: Payer: Self-pay | Admitting: Neurology

## 2023-12-15 VITALS — BP 100/65

## 2023-12-15 DIAGNOSIS — G43719 Chronic migraine without aura, intractable, without status migrainosus: Secondary | ICD-10-CM

## 2023-12-15 MED ORDER — ONABOTULINUMTOXINA 200 UNITS IJ SOLR
155.0000 [IU] | Freq: Once | INTRAMUSCULAR | Status: AC
  Administered 2023-12-15: 155 [IU] via INTRAMUSCULAR

## 2023-12-15 NOTE — Progress Notes (Signed)
 Botox - 200 units x 1 vial Lot: [id number] Expiration: 06/2025 NDC: 9976-6078-97  Bacteriostatic 0.9% Sodium Chloride - 4 mL  Lot: [id number] Expiration:OCT-31-2026 NDC: 9590-8033-97  Dx:G43.719 S/P Witnessed by: Robie Oneita LATHER

## 2023-12-15 NOTE — Progress Notes (Signed)
   BOTOX  PROCEDURE NOTE FOR MIGRAINE HEADACHE   HISTORY: [name] [name] is here for Botox .  Last was 09/16/23 with me. No migraines, but continues with moderate headaches. Nurtec works great! Remains on topamax .   Description of procedure:  The patient was placed in a sitting position. The standard protocol was used for Botox  as follows, with 5 units of Botox  injected at each site:   -Procerus muscle, midline injection  -Corrugator muscle, bilateral injection  -Frontalis muscle, bilateral injection, with 2 sites each side, medial injection was performed in the upper one third of the frontalis muscle, in the region vertical from the medial inferior edge of the superior orbital rim. The lateral injection was again in the upper one third of the forehead vertically above the lateral limbus of the cornea, 1.5 cm lateral to the medial injection site.  -Temporalis muscle injection, 4 sites, bilaterally. The first injection was 3 cm above the tragus of the ear, second injection site was 1.5 cm to 3 cm up from the first injection site in line with the tragus of the ear. The third injection site was 1.5-3 cm forward between the first 2 injection sites. The fourth injection site was 1.5 cm posterior to the second injection site.  -Occipitalis muscle injection, 3 sites, bilaterally. The first injection was done one half way between the occipital protuberance and the tip of the mastoid process behind the ear. The second injection site was done lateral and superior to the first, 1 fingerbreadth from the first injection. The third injection site was 1 fingerbreadth superiorly and medially from the first injection site.  -Cervical paraspinal muscle injection, 2 sites, bilateral, the first injection site was 1 cm from the midline of the cervical spine, 3 cm inferior to the lower border of the occipital protuberance. The second injection site was 1.5 cm superiorly and laterally to the first injection  site.  -Trapezius muscle injection was performed at 3 sites, bilaterally. The first injection site was in the upper trapezius muscle halfway between the inflection point of the neck, and the acromion. The second injection site was one half way between the acromion and the first injection site. The third injection was done between the first injection site and the inflection point of the neck.   A 200 unit bottle of Botox  was used, 155 units were injected, the rest of the Botox  was wasted. The patient tolerated the procedure well, there were no complications of the above procedure.  Botox  NDC 9976-6078-97 Lot number [id number] Expiration date 06/2025 SP

## 2023-12-26 ENCOUNTER — Other Ambulatory Visit: Payer: Self-pay | Admitting: Cardiology

## 2023-12-29 ENCOUNTER — Other Ambulatory Visit: Payer: Self-pay | Admitting: Cardiology

## 2024-01-07 ENCOUNTER — Ambulatory Visit
Admission: RE | Admit: 2024-01-07 | Discharge: 2024-01-07 | Disposition: A | Discharge status: Home / Self Care | Source: Ambulatory Visit | Attending: Hematology | Admitting: Hematology

## 2024-01-07 DIAGNOSIS — Z1231 Encounter for screening mammogram for malignant neoplasm of breast: Secondary | ICD-10-CM

## 2024-01-13 ENCOUNTER — Other Ambulatory Visit: Payer: Self-pay | Admitting: Cardiology

## 2024-01-27 ENCOUNTER — Other Ambulatory Visit: Payer: Self-pay | Admitting: Cardiology

## 2024-02-10 ENCOUNTER — Telehealth: Payer: Self-pay | Admitting: Neurology

## 2024-02-10 ENCOUNTER — Other Ambulatory Visit: Payer: Self-pay | Admitting: Cardiology

## 2024-02-10 NOTE — Telephone Encounter (Signed)
 Submitted PA renewal request via CMM, status is pending. Key: [id number]

## 2024-02-11 NOTE — Telephone Encounter (Signed)
 In accordance with refill protocols, please review and address the following requirements before this medication refill can be authorized:  Appointment  and Labs pt called, message left to call back, attempts made for pt to make an appt and had not done so. Pt has not had labs within 365 days.

## 2024-02-12 NOTE — Telephone Encounter (Addendum)
 Pt's medications were sent to pt's pharmacy as requested and authorized by Dr. Lavona for 6 mo. Pt must make F/U appt before anymore refills. Confirmation received.

## 2024-02-15 NOTE — Telephone Encounter (Signed)
 Auth#: [id number] (02/10/24-05/10/24)

## 2024-03-10 ENCOUNTER — Ambulatory Visit: Admitting: Neurology

## 2024-05-05 ENCOUNTER — Ambulatory Visit: Admitting: Cardiology
